# Patient Record
Sex: Female | Born: 1955 | Race: Black or African American | Hispanic: No | Smoking: Former smoker
Health system: Southern US, Community
[De-identification: ages and names within clinical notes are randomized; demographics above are authoritative.]

## PROBLEM LIST (undated history)

## (undated) ENCOUNTER — Ambulatory Visit (HOSPITAL_COMMUNITY): Discharge status: Absent without leave | Payer: Medicare HMO

## (undated) DIAGNOSIS — K219 Gastro-esophageal reflux disease without esophagitis: Secondary | ICD-10-CM

## (undated) DIAGNOSIS — G4733 Obstructive sleep apnea (adult) (pediatric): Secondary | ICD-10-CM

## (undated) DIAGNOSIS — M1990 Unspecified osteoarthritis, unspecified site: Secondary | ICD-10-CM

## (undated) DIAGNOSIS — J189 Pneumonia, unspecified organism: Secondary | ICD-10-CM

## (undated) DIAGNOSIS — U071 COVID-19: Secondary | ICD-10-CM

## (undated) DIAGNOSIS — I10 Essential (primary) hypertension: Secondary | ICD-10-CM

## (undated) DIAGNOSIS — R51 Headache: Secondary | ICD-10-CM

## (undated) HISTORY — DX: COVID-19: U07.1

## (undated) HISTORY — DX: Essential (primary) hypertension: I10

## (undated) HISTORY — PX: CERVICAL FUSION: SHX112

## (undated) HISTORY — DX: Pneumonia, unspecified organism: J18.9

## (undated) HISTORY — DX: Gastro-esophageal reflux disease without esophagitis: K21.9

## (undated) HISTORY — DX: Obstructive sleep apnea (adult) (pediatric): G47.33

## (undated) HISTORY — PX: OTHER SURGICAL HISTORY: SHX169

---

## 1999-04-17 ENCOUNTER — Inpatient Hospital Stay (HOSPITAL_COMMUNITY): Admission: EM | Admit: 1999-04-17 | Discharge: 1999-04-18 | Payer: Self-pay | Admitting: Emergency Medicine

## 2009-10-07 ENCOUNTER — Emergency Department (HOSPITAL_BASED_OUTPATIENT_CLINIC_OR_DEPARTMENT_OTHER): Admission: EM | Admit: 2009-10-07 | Discharge: 2009-10-07 | Payer: Self-pay | Admitting: Emergency Medicine

## 2011-06-13 ENCOUNTER — Emergency Department (HOSPITAL_COMMUNITY): Payer: No Typology Code available for payment source

## 2011-06-13 ENCOUNTER — Observation Stay (HOSPITAL_COMMUNITY)
Admission: EM | Admit: 2011-06-13 | Discharge: 2011-06-14 | Disposition: A | Discharge status: Home Health | Payer: No Typology Code available for payment source | Attending: Emergency Medicine | Admitting: Emergency Medicine

## 2011-06-13 ENCOUNTER — Encounter (HOSPITAL_COMMUNITY): Payer: Self-pay | Admitting: Emergency Medicine

## 2011-06-13 ENCOUNTER — Other Ambulatory Visit: Payer: Self-pay

## 2011-06-13 DIAGNOSIS — Z87891 Personal history of nicotine dependence: Secondary | ICD-10-CM | POA: Insufficient documentation

## 2011-06-13 DIAGNOSIS — IMO0001 Reserved for inherently not codable concepts without codable children: Secondary | ICD-10-CM | POA: Insufficient documentation

## 2011-06-13 DIAGNOSIS — R079 Chest pain, unspecified: Principal | ICD-10-CM | POA: Insufficient documentation

## 2011-06-13 DIAGNOSIS — I2510 Atherosclerotic heart disease of native coronary artery without angina pectoris: Secondary | ICD-10-CM | POA: Insufficient documentation

## 2011-06-13 DIAGNOSIS — I10 Essential (primary) hypertension: Secondary | ICD-10-CM | POA: Insufficient documentation

## 2011-06-13 LAB — CBC
MCH: 29.9 pg (ref 26.0–34.0)
MCHC: 34.3 g/dL (ref 30.0–36.0)
MCV: 87.3 fL (ref 78.0–100.0)
Platelets: 319 10*3/uL (ref 150–400)
RBC: 4.58 MIL/uL (ref 3.87–5.11)

## 2011-06-13 LAB — BASIC METABOLIC PANEL
CO2: 25 mEq/L (ref 19–32)
Calcium: 9.7 mg/dL (ref 8.4–10.5)
Creatinine, Ser: 0.76 mg/dL (ref 0.50–1.10)
GFR calc non Af Amer: >90 mL/min (ref >90)
Glucose, Bld: 105 mg/dL — ABNORMAL HIGH (ref 70–99)

## 2011-06-13 LAB — POCT I-STAT TROPONIN I: Troponin i, poc: 0 ng/mL (ref 0.00–0.08)

## 2011-06-13 MED ORDER — ONDANSETRON HCL 4 MG/2ML IJ SOLN: 4.0000 mg | Freq: Four times a day (QID) | INTRAMUSCULAR | Status: DC | PRN

## 2011-06-13 MED ORDER — MAGNESIUM HYDROXIDE 400 MG/5ML PO SUSP: 30.0000 mL | Freq: Two times a day (BID) | ORAL | Status: DC | PRN

## 2011-06-13 MED ORDER — METOPROLOL TARTRATE 25 MG PO TABS
50.0000 mg | ORAL_TABLET | Freq: Once | ORAL | Status: DC
  Filled 2011-06-13: qty 2

## 2011-06-13 MED ORDER — METOPROLOL TARTRATE 25 MG PO TABS
100.0000 mg | ORAL_TABLET | Freq: Once | ORAL | Status: DC
  Filled 2011-06-13: qty 4

## 2011-06-13 MED ORDER — ASPIRIN EC 325 MG PO TBEC: 325.0000 mg | DELAYED_RELEASE_TABLET | Freq: Every day | ORAL | Status: DC

## 2011-06-13 MED ORDER — ZOLPIDEM TARTRATE 5 MG PO TABS
5.0000 mg | ORAL_TABLET | Freq: Every evening | ORAL | Status: DC | PRN
  Administered 2011-06-14: 5 mg via ORAL
  Filled 2011-06-13: qty 1

## 2011-06-13 NOTE — ED Notes (Signed)
Patient complaining of chest pain that started this afternoon while she was on her way to church; patient states that the chest pain was worsened over the course of the evening.  Describes chest pain as mid-sternal, radiation to right arm. Reports blurred vision; denies other associated symptoms including shortness of breath, nausea, and vomiting.  Denies cardiac history.

## 2011-06-13 NOTE — ED Provider Notes (Addendum)
History     CSN: [id number]  Arrival date & time 06/13/11  2055   First MD Initiated Contact with Patient 06/13/11 2211      Chief Complaint  Patient presents with  . Chest Pain    (Consider location/radiation/quality/duration/timing/severity/associated sxs/prior treatment) Patient is a 56 y.o. female presenting with chest pain. The history is provided by the patient.  Chest Pain Pertinent negatives for primary symptoms include no shortness of breath, no abdominal pain, no nausea and no vomiting.  Pertinent negatives for associated symptoms include no numbness and no weakness.    patient has chest pain that started this afternoon loss was on her way to church. He was initially sharp but then became more dull. It is in the upper chest and goes to the right side a little bit. She said her vision did pleural. No shortness of breath, nauseousness, vomiting, or diaphoresis. She is a former smoker but quit 3 months ago. She has mild hypertension. Her pain is much improved now. No recent travel. She recently moved her from [place] and does not have a physician in town.  Past Medical History  Diagnosis Date  . No significant past medical history     Past Surgical History  Procedure Date  . Neck fusion     History reviewed. No pertinent family history.  History  Substance Use Topics  . Smoking status: Former Smoker    Quit date: 04/10/2011  . Smokeless tobacco: Not on file  . Alcohol Use: Yes     Occassional Use    OB History    Grav Para Term Preterm Abortions TAB SAB Ect Mult Living                  Review of Systems  Constitutional: Negative for activity change and appetite change.  HENT: Negative for neck stiffness.   Eyes: Negative for pain.  Respiratory: Negative for chest tightness and shortness of breath.   Cardiovascular: Positive for chest pain. Negative for leg swelling.  Gastrointestinal: Negative for nausea, vomiting, abdominal pain and diarrhea.    Genitourinary: Negative for flank pain.  Musculoskeletal: Negative for back pain.  Skin: Negative for rash.  Neurological: Negative for weakness, numbness and headaches.  Psychiatric/Behavioral: Negative for behavioral problems.    Allergies  Review of patient's allergies indicates no known allergies.  Home Medications   Current Outpatient Rx  Name Route Sig Dispense Refill  . AMLODIPINE BESYLATE 5 MG PO TABS Oral Take 5 mg by mouth daily.    . TRIAMTERENE-HCTZ 37.5-25 MG PO TABS Oral Take 1 tablet by mouth daily.      BP 158/82  Pulse 72  Temp(Src) 97.7 F (36.5 C) (Oral)  Resp 14  Ht 5\' 9"  (1.753 m)  Wt 199 lb (90.266 kg)  BMI 29.39 kg/m2  SpO2 100%  Physical Exam  Nursing note and vitals reviewed. Constitutional: She is oriented to person, place, and time. She appears well-developed and well-nourished.  HENT:  Head: Normocephalic and atraumatic.  Eyes: EOM are normal. Pupils are equal, round, and reactive to light.  Neck: Normal range of motion. Neck supple.  Cardiovascular: Normal rate, regular rhythm and normal heart sounds.   No murmur heard. Pulmonary/Chest: Effort normal and breath sounds normal. No respiratory distress. She has no wheezes. She has no rales.       No tenderness anterior chest. Pain is not worse with movement  Abdominal: Soft. Bowel sounds are normal. She exhibits no distension. There is no tenderness. There  is no rebound and no guarding.  Musculoskeletal: Normal range of motion.  Neurological: She is alert and oriented to person, place, and time. No cranial nerve deficit.  Skin: Skin is warm and dry.  Psychiatric: She has a normal mood and affect. Her speech is normal.    ED Course  Procedures (including critical care time)  Labs Reviewed  BASIC METABOLIC PANEL - Abnormal; Notable for the following:    Glucose, Bld 105 (*)    All other components within normal limits  CBC  POCT I-STAT TROPONIN I   Dg Chest 2 View  06/13/2011   *RADIOLOGY REPORT*  Clinical Data: Chest pain.  CHEST - 2 VIEW  Comparison: None.  Findings: Heart size and pulmonary vascularity are normal and the lungs are clear.  No effusions.  No acute osseous abnormality.  IMPRESSION: Normal chest.  Original Report Authenticated By: Gwynn Burly, M.D.     No diagnosis found.   Date: 06/14/2011  Rate: 75  Rhythm: normal sinus rhythm  QRS Axis: normal  Intervals: normal  ST/T Wave abnormalities: normal  Conduction Disutrbances:none  Narrative Interpretation: [facility]  Old EKG Reviewed: none available   Date: 06/14/2011  Rate: 64  Rhythm: normal sinus rhythm  QRS Axis: normal  Intervals: normal  ST/T Wave abnormalities: normal  Conduction Disutrbances:none  Narrative Interpretation:   Old EKG Reviewed: unchanged      MDM  Chest pain. Reassuring EKG and lower story. She was put in the CDU I was protocol for a CTA of the heart tomorrow.        Juliet Rude. Rubin Payor, MD 06/14/11 0018  Juliet Rude. Rubin Payor, MD 06/14/11 [phone]

## 2011-06-14 ENCOUNTER — Observation Stay (HOSPITAL_COMMUNITY): Payer: No Typology Code available for payment source

## 2011-06-14 ENCOUNTER — Other Ambulatory Visit: Payer: Self-pay

## 2011-06-14 MED ORDER — METOPROLOL TARTRATE 25 MG PO TABS: 50.0000 mg | ORAL_TABLET | Freq: Once | ORAL | Status: DC

## 2011-06-14 MED ORDER — IOHEXOL 350 MG/ML SOLN
80.0000 mL | Freq: Once | INTRAVENOUS | Status: AC | PRN
  Administered 2011-06-14: 80 mL via INTRAVENOUS

## 2011-06-14 MED ORDER — METOPROLOL TARTRATE 1 MG/ML IV SOLN
INTRAVENOUS | Status: AC
  Filled 2011-06-14: qty 5

## 2011-06-14 MED ORDER — NITROGLYCERIN 0.4 MG SL SUBL
0.4000 mg | SUBLINGUAL_TABLET | Freq: Once | SUBLINGUAL | Status: AC
  Administered 2011-06-14: 0.4 mg via SUBLINGUAL

## 2011-06-14 MED ORDER — METOPROLOL TARTRATE 1 MG/ML IV SOLN
5.0000 mg | Freq: Once | INTRAVENOUS | Status: AC
  Administered 2011-06-14: 5 mg via INTRAVENOUS

## 2011-06-14 MED ORDER — NITROGLYCERIN 0.4 MG SL SUBL
SUBLINGUAL_TABLET | SUBLINGUAL | Status: AC
  Filled 2011-06-14: qty 25

## 2011-06-14 MED ORDER — METOPROLOL TARTRATE 1 MG/ML IV SOLN
INTRAVENOUS | Status: AC
  Filled 2011-06-14: qty 10

## 2011-06-14 MED ORDER — METOPROLOL TARTRATE 25 MG PO TABS
100.0000 mg | ORAL_TABLET | Freq: Once | ORAL | Status: AC
  Administered 2011-06-14: 100 mg via ORAL
  Filled 2011-06-14: qty 4

## 2011-06-14 NOTE — ED Provider Notes (Addendum)
Patient pending CT coronary this morning. She is resting comfortably with negative serial troponins. VSS. Patient will be given PCP referrals if negative CT coronary report.   Lenon Oms [place], [place] 06/14/11 0840  Dr. 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 Patient is currently on HTN medication that she states she has not had since coming to ER but with HTN usually well controlled. She agrees to continuing her 3 mo hx of tobacco cessation and following up with PCP referral for cholesterol check and ongoing management of HTN. [facility] referral given for further evaluation of newly dx mild non obstructing CAD. Patient voices understanding of aforementioned treatment plan and is agreeable.   Jenness [place], [place] 06/14/11 1006

## 2011-06-14 NOTE — ED Provider Notes (Signed)
I was available for the completion of this patient's ED CDU evaluation.  Please see the initial ED note for Attending MD evaluation.  Gerhard Munch, MD 06/14/11 1501

## 2011-06-14 NOTE — ED Notes (Signed)
Moved to CDU 3 after report called to [facility].

## 2011-06-14 NOTE — Discharge Instructions (Signed)
Call [facility] cardiology today or tomorrow to establish close followup for recheck of your mild coronary artery disease but also call [place] health care for close followup for ongoing management of your high blood pressure and for testing of cholesterol. Return to emergency department for emergent changing or worsening symptoms  Coronary Artery Disease, Risk Factors Research has shown that the risk of developing coronary artery disease (CAD) and having a heart attack increases with each factor you have. RISK FACTORS YOU CANNOT CHANGE  Your age. Your risk goes up as you get older. Most heart attacks happen to people over the age of 53.   Gender. Men have a greater risk of heart attack than women, and they have attacks earlier in life. However, women are more likely to die from a heart attack.   Heredity. Children of parents with heart disease are more likely to develop it themselves.   Race. African Americans and other ethnic groups have a higher risk, possibly because of high blood pressure, a tendency toward obesity, and diabetes.   Your family. Most people with a strong family history of heart disease have one or more other risk factors.  RISK FACTORS YOU CAN CHANGE  Exposure to tobacco smoke. Even secondhand smoke greatly increases the risk for heart disease.   High blood cholesterol may be lowered with changes in diet, activity, and medicines.   High blood pressure makes the heart work harder. This causes the heart muscles to become thick and, eventually, weaker. It also increases your risk of stroke, heart attack, and kidney or heart failure.   Physical inactivity is a risk factor for CAD. Regular physical activity helps prevent heart and blood vessel disease. Exercise helps control blood cholesterol, diabetes, obesity, and it may help lower blood pressure in some people.   Excess body fat, especially belly fat, increases the risk of heart disease and stroke even if there are no other  risk factors. Excess weight increases the heart's workload and raises blood pressure and blood cholesterol.   Diabetes seriously increases your risk of developing CAD. If you have diabetes, you should work with your caregiver to manage it and control other risk factors.  OTHER RISK FACTORS FOR CAD  How you respond to stress.   Drinking too much alcohol may raise blood pressure, cause heart failure, and lead to stroke.   Total cholesterol greater than 200 milligrams.   HDL (good) cholesterol less than 40 milligrams. HDL helps keep cholesterol from building up in the walls of the arteries.  PREVENTING CAD  Maintain a healthy weight.   Exercise or do physical activity.   Eat a heart-healthy diet low in fat and salt and high in fiber.   Control your blood pressure to keep it below 120 over 80.   Keep your cholesterol at a level that lowers your risk.   Manage diabetes if you have it.   Stop smoking.   Learn how to manage stress.  HEART SMART SUBSTITUTIONS  Instead of whole or 2% milk and cream, use skim milk.   Instead of fried foods, eat baked, steamed, boiled, broiled, or microwaved foods.   Instead of lard, butter, palm and coconut oils, cook with unsaturated vegetable oils, such as corn, olive, canola, safflower, sesame, soybean, sunflower, or peanut.   Instead of fatty cuts of meat, eat lean cuts of meat or cut off the fatty parts.   Instead of 1 whole egg in recipes, use 2 egg whites.   Instead of sauces, butter,  and salt, season vegetables with herbs and spices.   Instead of regular hard and processed cheeses, eat low-fat, low-sodium cheeses.   Instead of salted potato chips, choose low-fat, unsalted tortilla and potato chips and unsalted pretzels and popcorn.   Instead of sour cream and mayonnaise, use plain low-fat yogurt, low-fat cottage cheese, or low-fat or "light" sour cream.  FOR MORE INFORMATION  National Heart Lung and Blood Institute:  https://nielsen.com/ American Heart Association: PopSte[employer].is Document Released: 06/16/2003 Document Revised: 03/15/2011 Document Reviewed: 06/11/2007 [facility] Patient Information 2012 [place], [place].  Chest Pain (Nonspecific) It is often hard to give a specific diagnosis for the cause of chest pain. There is always a chance that your pain could be related to something serious, such as a heart attack or a blood clot in the lungs. You need to follow up with your caregiver for further evaluation. CAUSES   Heartburn.   Pneumonia or bronchitis.   Anxiety or stress.   Inflammation around your heart (pericarditis) or lung (pleuritis or pleurisy).   A blood clot in the lung.   A collapsed lung (pneumothorax). It can develop suddenly on its own (spontaneous pneumothorax) or from injury (trauma) to the chest.   Shingles infection (herpes zoster virus).  The chest wall is composed of bones, muscles, and cartilage. Any of these can be the source of the pain.  The bones can be bruised by injury.   The muscles or cartilage can be strained by coughing or overwork.   The cartilage can be affected by inflammation and become sore (costochondritis).  DIAGNOSIS  Lab tests or other studies, such as X-rays, electrocardiography, stress testing, or cardiac imaging, may be needed to find the cause of your pain.  TREATMENT   Treatment depends on what may be causing your chest pain. Treatment may include:   Acid blockers for heartburn.   Anti-inflammatory medicine.   Pain medicine for inflammatory conditions.   Antibiotics if an infection is present.   You may be advised to change lifestyle habits. This includes stopping smoking and avoiding alcohol, caffeine, and chocolate.   You may be advised to keep your head raised (elevated) when sleeping. This reduces the chance of acid going backward from your stomach into your esophagus.   Most of the time, nonspecific chest  pain will improve within 2 to 3 days with rest and mild pain medicine.  HOME CARE INSTRUCTIONS   If antibiotics were prescribed, take your antibiotics as directed. Finish them even if you start to feel better.   For the next few days, avoid physical activities that bring on chest pain. Continue physical activities as directed.   Do not smoke.   Avoid drinking alcohol.   Only take over-the-counter or prescription medicine for pain, discomfort, or fever as directed by your caregiver.   Follow your caregiver's suggestions for further testing if your chest pain does not go away.   Keep any follow-up appointments you made. If you do not go to an appointment, you could develop lasting (chronic) problems with pain. If there is any problem keeping an appointment, you must call to reschedule.  SEEK MEDICAL CARE IF:   You think you are having problems from the medicine you are taking. Read your medicine instructions carefully.   Your chest pain does not go away, even after treatment.   You develop a rash with blisters on your chest.  SEEK IMMEDIATE MEDICAL CARE IF:   You have increased chest pain or pain that spreads to your arm,  neck, jaw, back, or abdomen.   You develop shortness of breath, an increasing cough, or you are coughing up blood.   You have severe back or abdominal pain, feel nauseous, or vomit.   You develop severe weakness, fainting, or chills.   You have a fever.  THIS IS AN EMERGENCY. Do not wait to see if the pain will go away. Get medical help at once. Call your local emergency services (911 in [place] Do not drive yourself to the hospital. MAKE SURE YOU:   Understand these instructions.   Will watch your condition.   Will get help right away if you are not doing well or get worse.  Document Released: 01/03/2005 Document Revised: 03/15/2011 Document Reviewed: 10/30/2007 [facility] Patient Information 2012 [place], [place].

## 2011-06-14 NOTE — Progress Notes (Signed)
Observation review is complete. 

## 2011-06-14 NOTE — ED Provider Notes (Signed)
I was available for the completion of this patient's ED CDU evaluation.  Please see the initial ED note for Attending MD evaluation.  Gerhard Munch, MD 06/14/11 [phone]

## 2011-06-18 ENCOUNTER — Encounter: Payer: PRIVATE HEALTH INSURANCE | Admitting: Nurse Practitioner

## 2011-06-18 ENCOUNTER — Ambulatory Visit (INDEPENDENT_AMBULATORY_CARE_PROVIDER_SITE_OTHER): Payer: PRIVATE HEALTH INSURANCE | Admitting: Cardiology

## 2011-06-18 ENCOUNTER — Encounter: Payer: Self-pay | Admitting: Cardiology

## 2011-06-18 DIAGNOSIS — R079 Chest pain, unspecified: Secondary | ICD-10-CM | POA: Insufficient documentation

## 2011-06-18 DIAGNOSIS — K219 Gastro-esophageal reflux disease without esophagitis: Secondary | ICD-10-CM | POA: Insufficient documentation

## 2011-06-18 DIAGNOSIS — I10 Essential (primary) hypertension: Secondary | ICD-10-CM | POA: Insufficient documentation

## 2011-06-18 NOTE — Progress Notes (Signed)
  HPI: 56 year old female for evaluation of chest pain. Seen in the emergency room on March 6 with chest pain. Cardiac enzymes negative. Hemoglobin, renal function and chest x-ray unremarkable. Cardiac CT showed a 25-50% first diagonal (closer to 25). No other disease noted. Coronary calcium score 0. Patient asked to arrange appointment. Patient states that on the night of evaluation she developed substernal chest pressure. It was different in her previous reflux pain. There was some radiation to her right upper extremity. There was some increase with inspiration. No change with position. No water brash. No associated symptoms. Resolved after 20 minutes. She had a second episode that lasted 5 minutes while in the ER. She has not had exertional chest pain but notes dyspnea with more extreme activities. No orthopnea, PND, pedal edema. No recent travel or leg injury.  Current Outpatient Prescriptions  Medication Sig Dispense Refill  . amLODipine (NORVASC) 5 MG tablet Take 5 mg by mouth daily.      [name] OMEPRAZOLE PO Take by mouth. Over the counter  As needed      . triamterene-hydrochlorothiazide (MAXZIDE-25) 37.5-25 MG per tablet Take 1 tablet by mouth daily.        No Known Allergies  Past Medical History  Diagnosis Date  . No significant past medical history     Past Surgical History  Procedure Date  . Neck fusion     History   Social History  . Marital Status: Divorced    Spouse Name: N/A    Number of Children: N/A  . Years of Education: N/A   Occupational History  . Not on file.   Social History Main Topics  . Smoking status: Former Smoker    Quit date: 04/10/2011  . Smokeless tobacco: Not on file  . Alcohol Use: Yes     Occassional Use  . Drug Use: No  . Sexually Active:    Other Topics Concern  . Not on file   Social History Narrative  . No narrative on file    No family history on file.  ROS:  no fevers or chills, productive cough, hemoptysis, dysphasia,  odynophagia, melena, hematochezia, dysuria, hematuria, rash, seizure activity, orthopnea, PND, pedal edema, claudication. Remaining systems are negative.  Physical Exam:   Blood pressure 140/84, pulse 72, height 5\' 9"  (1.753 m), weight 197 lb (89.359 kg).  General:  Well developed/well nourished in NAD Skin warm/dry Patient not depressed No peripheral clubbing Back-normal HEENT-normal/normal eyelids Neck supple/normal carotid upstroke bilaterally; no bruits; no JVD; no thyromegaly chest - CTA/ normal expansion CV - RRR/normal S1 and S2; no murmurs, rubs or gallops;  PMI nondisplaced Abdomen -NT/ND, no HSM, no mass, + bowel sounds, no bruit; no RUQ tenderness 2+ femoral pulses, no bruits Ext-no edema, chords, 2+ DP Neuro-grossly nonfocal  ECG 06/13/2011-sinus rhythm at a rate of 75. RV conduction delay. No ST changes.

## 2011-06-18 NOTE — Assessment & Plan Note (Signed)
Blood pressure controlled. Continue present medications. 

## 2011-06-18 NOTE — Assessment & Plan Note (Signed)
Patient symptoms atypical. Her electrocardiogram shows no ST changes. Her enzymes were negative. Hemoglobin normal. Her cardiac CT showed a calcium score of 0. There was a 25% lesion in the first diagonal. I do not think further cardiac workup is indicated. Continue Prilosec for possible reflux. Will arrange consult and followup with primary care. Her symptoms may be GI. She does need continued lifestyle modification including diet and exercise.

## 2011-06-18 NOTE — Patient Instructions (Signed)
Referral to primary care=[address]

## 2011-06-19 ENCOUNTER — Ambulatory Visit (INDEPENDENT_AMBULATORY_CARE_PROVIDER_SITE_OTHER): Payer: PRIVATE HEALTH INSURANCE | Admitting: Family

## 2011-06-19 ENCOUNTER — Encounter: Payer: Self-pay | Admitting: Family

## 2011-06-19 VITALS — BP 140/90 | Ht 69.0 in | Wt 197.0 lb

## 2011-06-19 DIAGNOSIS — Z1231 Encounter for screening mammogram for malignant neoplasm of breast: Secondary | ICD-10-CM

## 2011-06-19 DIAGNOSIS — I10 Essential (primary) hypertension: Secondary | ICD-10-CM

## 2011-06-19 DIAGNOSIS — K219 Gastro-esophageal reflux disease without esophagitis: Secondary | ICD-10-CM

## 2011-06-19 MED ORDER — ESOMEPRAZOLE MAGNESIUM 40 MG PO CPDR: 40.0000 mg | DELAYED_RELEASE_CAPSULE | Freq: Every day | ORAL | Status: DC

## 2011-06-19 MED ORDER — CYCLOBENZAPRINE HCL 10 MG PO TABS: 10.0000 mg | ORAL_TABLET | Freq: Three times a day (TID) | ORAL | Status: AC | PRN

## 2011-06-19 NOTE — Progress Notes (Signed)
Subjective:    Patient ID: Brianna Foster, female    DOB: [birth year], 56 y.o.   MRN: [id number]  HPI 56 year old [place] American female, nonsmoker is in for ED followup. She has a history of hypertension and GERD. She was seen in the emergency department with chest pain of her left chest, that radiated into her left arm. She has seen cardiology on yesterday who has been a full cardiac evaluation and this cleared her for any cardiac abnormality. The cardiologist suggests that this is musculoskeletal pain. In comparison to when the pain first started, her pain is significantly improved. She rates the pain a 6/10 at its worst with movement. She has not taken any medication for the discomfort at this point.  Patient has requested to have a complete physical exam. She is overdue on her mammogram and Pap smear. As of note, patient's father deceased with stomach cancer. Brother with esophageal cancer. She has a history of GERD, currently taken omeprazole but continues to have reflux symptoms.   Review of Systems  Constitutional: Negative.   HENT: Negative.   Eyes: Negative.   Respiratory: Negative.   Cardiovascular: Negative.        Chest wall pain  Genitourinary: Negative.   Musculoskeletal: Positive for myalgias.       Chest wall pain  Skin: Negative.   Neurological: Negative.   Hematological: Negative.    Past Medical History  Diagnosis Date  . Hypertension   . GERD (gastroesophageal reflux disease)     History   Social History  . Marital Status: Divorced    Spouse Name: N/A    Number of Children: 1  . 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 Smoking status: Former Smoker    Quit date: 04/10/2011  . Smokeless tobacco: Not on file  . Alcohol Use: Yes     Occassional Use  . Drug Use: No  . Sexually Active:    Other Topics Concern  . Not on file   Social History Narrative  . No narrative on file     Past Surgical History  Procedure Date  . Neck fusion     Family History  Problem Relation Age of Onset  . Heart disease Brother     Pacemaker    No Known Allergies  Current Outpatient Prescriptions on File Prior to Visit  Medication Sig Dispense Refill  . amLODipine (NORVASC) 5 MG tablet Take 5 mg by mouth daily.      [name] OMEPRAZOLE PO Take by mouth. Over the counter  As needed      . triamterene-hydrochlorothiazide (MAXZIDE-25) 37.5-25 MG per tablet Take 1 tablet by mouth daily.        BP 140/90  Ht 5\' 9"  (1.753 m)  Wt 197 lb (89.359 kg)  BMI 29.09 kg/m2chart    Objective:   Physical Exam  Constitutional: She is oriented to person, place, and time. She appears well-developed and well-nourished.  HENT:  Right Ear: External ear normal.  Left Ear: External ear normal.  Nose: Nose normal.  Mouth/Throat: Oropharynx is clear and moist.  Neck: Normal range of motion. Neck supple.  Cardiovascular: Normal rate, regular rhythm and normal heart sounds.   Pulmonary/Chest: Effort normal and breath sounds normal.  Abdominal: Soft.  Musculoskeletal: Normal range of motion.       Reproducible chest wall pain to palpation of the left chest.  Neurological: She  is alert and oriented to person, place, and time.  Skin: Skin is warm and dry.  Psychiatric: She has a normal mood and affect.          Assessment & Plan:  Assessment: Hypertension, GERD, costochondritis  Plan: Flexeril 10 mg 3 times a day as needed. DC omeprazole and start Nexium 40 mg once daily. Patient will return for complete physical exam for Pap smear. Mammogram requisition completed. Encouraged healthy diet, exercise self breast exams we'll follow with the patient at physical and sooner when necessary.

## 2011-06-19 NOTE — Patient Instructions (Signed)
Diet for GERD or PUD Nutrition therapy can help ease the discomfort of gastroesophageal reflux disease (GERD) and peptic ulcer disease (PUD).  HOME CARE INSTRUCTIONS   Eat your meals slowly, in a relaxed setting.   Eat 5 to 6 small meals per day.   If a food causes distress, stop eating it for a period of time.  FOODS TO AVOID  Coffee, regular or decaffeinated.   Cola beverages, regular or low calorie.   Tea, regular or decaffeinated.   Pepper.   Cocoa.   High fat foods, including meats.   Butter, margarine, hydrogenated oil (trans fats).   Peppermint or spearmint (if you have GERD).   Fruits and vegetables if not tolerated.   Alcohol.   Nicotine (smoking or chewing). This is one of the most potent stimulants to acid production in the gastrointestinal tract.   Any food that seems to aggravate your condition.  If you have questions regarding your diet, ask your caregiver or a registered dietitian. TIPS  Lying flat may make symptoms worse. Keep the head of your bed raised 6 to 9 inches (15 to 23 cm) by using a foam wedge or blocks under the legs of the bed.   Do not lay down until 3 hours after eating a meal.   Daily physical activity may help reduce symptoms.  MAKE SURE YOU:   Understand these instructions.   Will watch your condition.   Will get help right away if you are not doing well or get worse.  Document Released: 03/26/2005 Document Revised: 03/15/2011 Document Reviewed: 02/09/2011 [employer] Patient Information 2012 [employer]. 

## 2011-06-28 ENCOUNTER — Ambulatory Visit (INDEPENDENT_AMBULATORY_CARE_PROVIDER_SITE_OTHER): Payer: PRIVATE HEALTH INSURANCE | Admitting: Family

## 2011-06-28 ENCOUNTER — Encounter: Payer: Self-pay | Admitting: Family

## 2011-06-28 ENCOUNTER — Other Ambulatory Visit (HOSPITAL_COMMUNITY)
Admission: RE | Admit: 2011-06-28 | Discharge: 2011-06-28 | Disposition: A | Discharge status: Home / Self Care | Payer: PRIVATE HEALTH INSURANCE | Source: Ambulatory Visit | Attending: Family | Admitting: Family

## 2011-06-28 VITALS — BP 150/90 | Temp 98.3°F | Ht 69.0 in | Wt 199.0 lb

## 2011-06-28 DIAGNOSIS — I10 Essential (primary) hypertension: Secondary | ICD-10-CM

## 2011-06-28 DIAGNOSIS — Z0000 Encounter for general adult medical examination without abnormal findings: Secondary | ICD-10-CM

## 2011-06-28 DIAGNOSIS — Z01419 Encounter for gynecological examination (general) (routine) without abnormal findings: Secondary | ICD-10-CM | POA: Insufficient documentation

## 2011-06-28 DIAGNOSIS — K219 Gastro-esophageal reflux disease without esophagitis: Secondary | ICD-10-CM

## 2011-06-28 DIAGNOSIS — Z124 Encounter for screening for malignant neoplasm of cervix: Secondary | ICD-10-CM

## 2011-06-28 LAB — BASIC METABOLIC PANEL
BUN: 14 mg/dL (ref 6–23)
Calcium: 9.1 mg/dL (ref 8.4–10.5)
Creatinine, Ser: 0.7 mg/dL (ref 0.4–1.2)
GFR: 119.87 mL/min (ref >60.00)
Potassium: 3.7 mEq/L (ref 3.5–5.1)

## 2011-06-28 LAB — CBC
Hemoglobin: 13.7 g/dL (ref 12.0–15.0)
RDW: 12.4 % (ref 11.5–14.6)
WBC: 6.7 10*3/uL (ref 4.5–10.5)

## 2011-06-28 LAB — LIPID PANEL
Cholesterol: 177 mg/dL (ref 0–200)
HDL: 67.8 mg/dL (ref >39.00)
Triglycerides: 89 mg/dL (ref 0.0–149.0)
VLDL: 17.8 mg/dL (ref 0.0–40.0)

## 2011-06-28 MED ORDER — OMEPRAZOLE 40 MG PO CPDR: 40.0000 mg | DELAYED_RELEASE_CAPSULE | Freq: Every day | ORAL | Status: DC

## 2011-06-28 NOTE — Progress Notes (Signed)
Subjective:    Patient ID: Brianna Foster, female    DOB: 08/18/1955, 56 y.o.   MRN: [id number]  HPI Comments: This 56 year old African American female cover nonsmoker is in for complete physical exam. She has a history of GERD and she is currently taking Nexium 40 mg a day. He is tolerating the medication well, and it works well. However, her insurance will not cover the medication. Her last office visit we'll do followup of chest pain from the emergency department, since she's been taking Flexeril she has not had any more chest discomfort. Her last colonoscopy was in 2004 diagnostic screening. She has not had a screening colonoscopy. Her immunizations are up to date.     Review of Systems  Constitutional: Negative.   Eyes: Negative.   Respiratory: Negative.   Cardiovascular: Negative.   Gastrointestinal: Negative.   Genitourinary: Negative.   Musculoskeletal: Negative.   Skin: Negative.   Neurological: Negative.   Psychiatric/Behavioral: Negative.    Past Medical History  Diagnosis Date  . Hypertension   . GERD (gastroesophageal reflux disease)     History   Social History  . Marital Status: Divorced    Spouse Name: N/A    Number of Children: 1  . 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 Smoking status: Former Smoker    Quit date: 04/10/2011  . Smokeless tobacco: Not on file  . Alcohol Use: Yes     Occassional Use  . Drug Use: No  . Sexually Active:    Other Topics Concern  . Not on file   Social History Narrative  . No narrative on file    Past Surgical History  Procedure Date  . Neck fusion     Family History  Problem Relation Age of Onset  . Heart disease Brother     Pacemaker    No Known Allergies  Current Outpatient Prescriptions on File Prior to Visit  Medication Sig Dispense Refill  . amLODipine (NORVASC) 5 MG tablet Take 5 mg by mouth daily.      . cyclobenzaprine  (FLEXERIL) 10 MG tablet Take 1 tablet (10 mg total) by mouth 3 (three) times daily as needed for muscle spasms.  30 tablet  0  . esomeprazole (NEXIUM) 40 MG capsule Take 1 capsule (40 mg total) by mouth daily.  30 capsule  3  . triamterene-hydrochlorothiazide (MAXZIDE-25) 37.5-25 MG per tablet Take 1 tablet by mouth daily.        BP 150/90  Temp(Src) 98.3 F (36.8 C) (Oral)  Ht 5\' 9"  (1.753 m)  Wt 199 lb (90.266 kg)  BMI 29.39 kg/m2     Objective:   Physical Exam  Constitutional: She is oriented to person, place, and time. She appears well-developed and well-nourished.  HENT:  Head: Normocephalic and atraumatic.  Right Ear: External ear normal.  Left Ear: External ear normal.  Nose: Nose normal.  Mouth/Throat: Oropharynx is clear and moist.  Eyes: Conjunctivae and EOM are normal. Pupils are equal, round, and reactive to light.  Neck: Normal range of motion. Neck supple.  Cardiovascular: Normal rate, regular rhythm and normal heart sounds.   Pulmonary/Chest: Effort normal and breath sounds normal.  Abdominal: Soft. Bowel sounds are normal.  Genitourinary: Vagina normal and uterus normal.  Musculoskeletal: Normal range of motion.  Neurological: She is alert and oriented to person, place, and time. She has normal reflexes.  Skin:  Skin is warm and dry.  Psychiatric: She has a normal mood and affect.          Assessment & Plan:  Assessment: Complete physical exam with Pap smear, GERD, hypertension  Plan: Labs to include BMP, CBC, lipids, TSH Will notify patient of results. Encouraged healthy diet and exercise. Pap smear also sent. Will refer for screening colonoscopy. Patient to follow up on her chronic conditions in 6 months and sooner when necessary.

## 2011-06-28 NOTE — Patient Instructions (Signed)
Exercise to Stay Healthy  Exercise helps you become and stay healthy.    EXERCISE IDEAS AND TIPS  Choose exercises that:   You enjoy.   Fit into your day.  You do not need to exercise really hard to be healthy. You can do exercises at a slow or medium level and stay healthy. You can:   Stretch before and after working out.   Try yoga, Pilates, or tai chi.   Lift weights.   Walk fast, swim, jog, run, climb stairs, bicycle, dance, or rollerskate.   Take aerobic classes.    Exercises that burn about 150 calories:     Running 1  miles in 15 minutes.   Playing volleyball for 45 to 60 minutes.   Washing and waxing a car for 45 to 60 minutes.   Playing touch football for 45 minutes.   Walking 1  miles in 35 minutes.   Pushing a stroller 1  miles in 30 minutes.   Playing basketball for 30 minutes.   Raking leaves for 30 minutes.   Bicycling 5 miles in 30 minutes.   Walking 2 miles in 30 minutes.   Dancing for 30 minutes.   Shoveling snow for 15 minutes.   Swimming laps for 20 minutes.   Walking up stairs for 15 minutes.   Bicycling 4 miles in 15 minutes.   Gardening for 30 to 45 minutes.   Jumping rope for 15 minutes.   Washing windows or floors for 45 to 60 minutes.  Document Released: 04/28/2010 Document Revised: 03/15/2011 Document Reviewed: 04/28/2010  [employer] Patient Information 2012 [employer].

## 2011-07-02 ENCOUNTER — Ambulatory Visit: Payer: Self-pay

## 2011-07-03 ENCOUNTER — Telehealth: Payer: Self-pay | Admitting: Gastroenterology

## 2011-07-04 NOTE — Telephone Encounter (Signed)
Pt has been scheduled for New Pt appt on 07/23/11 new pt paperwork mailed

## 2011-07-09 ENCOUNTER — Ambulatory Visit: Payer: Self-pay

## 2011-07-11 ENCOUNTER — Ambulatory Visit: Payer: Self-pay

## 2011-07-18 ENCOUNTER — Ambulatory Visit: Payer: Self-pay

## 2011-07-23 ENCOUNTER — Ambulatory Visit: Payer: Self-pay | Admitting: Gastroenterology

## 2011-07-23 ENCOUNTER — Telehealth: Payer: Self-pay | Admitting: Gastroenterology

## 2011-07-23 NOTE — Telephone Encounter (Signed)
Do not charge  

## 2011-07-31 ENCOUNTER — Ambulatory Visit: Payer: PRIVATE HEALTH INSURANCE | Admitting: Family Medicine

## 2011-07-31 ENCOUNTER — Ambulatory Visit: Payer: PRIVATE HEALTH INSURANCE

## 2011-07-31 VITALS — BP 155/100 | HR 93 | Temp 98.5°F | Resp 18 | Ht 68.5 in | Wt 197.0 lb

## 2011-07-31 DIAGNOSIS — M25569 Pain in unspecified knee: Secondary | ICD-10-CM

## 2011-07-31 MED ORDER — HYDROCODONE-ACETAMINOPHEN 5-500 MG PO TABS: 1.0000 | ORAL_TABLET | ORAL | Status: AC | PRN

## 2011-07-31 MED ORDER — OXAPROZIN 600 MG PO TABS: 600.0000 mg | ORAL_TABLET | Freq: Two times a day (BID) | ORAL | Status: AC

## 2011-07-31 NOTE — Progress Notes (Signed)
Subjective: Patient was in [place] for the weekend. Walking carrying her grandchild and her right knee began hurting. No specific injury. No crepitance. Has a little swelling. No prior injury. Start regular exercise.  Objective He is minimally swollen tenderness laterally along the joint line walks with a significant limp. No warmth.  Assessment: Right knee pain  Plan  x-ray right knee [facility] reading (PRIMARY) by  Dr. Alwyn Ren Normal except very calcified patella  .

## 2011-07-31 NOTE — Patient Instructions (Signed)
Knee Pain The knee is the complex joint between your thigh and your lower leg. It is made up of bones, tendons, ligaments, and cartilage. The bones that make up the knee are:  The femur in the thigh.   The tibia and fibula in the lower leg.   The patella or kneecap riding in the groove on the lower femur.  CAUSES  Knee pain is a common complaint with many causes. A few of these causes are:  Injury, such as:   A ruptured ligament or tendon injury.   Torn cartilage.   Medical conditions, such as:   Gout   Arthritis   Infections   Overuse, over training or overdoing a physical activity.  Knee pain can be minor or severe. Knee pain can accompany debilitating injury. Minor knee problems often respond well to self-care measures or get well on their own. More serious injuries may need medical intervention or even surgery. SYMPTOMS The knee is complex. Symptoms of knee problems can vary widely. Some of the problems are:  Pain with movement and weight bearing.   Swelling and tenderness.   Buckling of the knee.   Inability to straighten or extend your knee.   Your knee locks and you cannot straighten it.   Warmth and redness with pain and fever.   Deformity or dislocation of the kneecap.  DIAGNOSIS  Determining what is wrong may be very straight forward such as when there is an injury. It can also be challenging because of the complexity of the knee. Tests to make a diagnosis may include:  Your caregiver taking a history and doing a physical exam.   Routine X-rays can be used to rule out other problems. X-rays will not reveal a cartilage tear. Some injuries of the knee can be diagnosed by:   Arthroscopy a surgical technique by which a small video camera is inserted through tiny incisions on the sides of the knee. This procedure is used to examine and repair internal knee joint problems. Tiny instruments can be used during arthroscopy to repair the torn knee cartilage  (meniscus).   Arthrography is a radiology technique. A contrast liquid is directly injected into the knee joint. Internal structures of the knee joint then become visible on X-ray film.   An MRI scan is a non x-ray radiology procedure in which magnetic fields and a computer produce two- or three-dimensional images of the inside of the knee. Cartilage tears are often visible using an MRI scanner. MRI scans have largely replaced arthrography in diagnosing cartilage tears of the knee.   Blood work.   Examination of the fluid that helps to lubricate the knee joint (synovial fluid). This is done by taking a sample out using a needle and a syringe.  TREATMENT The treatment of knee problems depends on the cause. Some of these treatments are:  Depending on the injury, proper casting, splinting, surgery or physical therapy care will be needed.   Give yourself adequate recovery time. Do not overuse your joints. If you begin to get sore during workout routines, back off. Slow down or do fewer repetitions.   For repetitive activities such as cycling or running, maintain your strength and nutrition.   Alternate muscle groups. For example if you are a weight lifter, work the upper body on one day and the lower body the next.   Either tight or weak muscles do not give the proper support for your knee. Tight or weak muscles do not absorb the stress placed   on the knee joint. Keep the muscles surrounding the knee strong.   Take care of mechanical problems.   If you have flat feet, orthotics or special shoes may help. See your caregiver if you need help.   Arch supports, sometimes with wedges on the inner or outer aspect of the heel, can help. These can shift pressure away from the side of the knee most bothered by osteoarthritis.   A brace called an "unloader" brace also may be used to help ease the pressure on the most arthritic side of the knee.   If your caregiver has prescribed crutches, braces,  wraps or ice, use as directed. The acronym for this is PRICE. This means protection, rest, ice, compression and elevation.   Nonsteroidal anti-inflammatory drugs (NSAID's), can help relieve pain. But if taken immediately after an injury, they may actually increase swelling. Take NSAID's with food in your stomach. Stop them if you develop stomach problems. Do not take these if you have a history of ulcers, stomach pain or bleeding from the bowel. Do not take without your caregiver's approval if you have problems with fluid retention, heart failure, or kidney problems.   For ongoing knee problems, physical therapy may be helpful.   Glucosamine and chondroitin are over-the-counter dietary supplements. Both may help relieve the pain of osteoarthritis in the knee. These medicines are different from the usual anti-inflammatory drugs. Glucosamine may decrease the rate of cartilage destruction.   Injections of a corticosteroid drug into your knee joint may help reduce the symptoms of an arthritis flare-up. They may provide pain relief that lasts a few months. You may have to wait a few months between injections. The injections do have a small increased risk of infection, water retention and elevated blood sugar levels.   Hyaluronic acid injected into damaged joints may ease pain and provide lubrication. These injections may work by reducing inflammation. A series of shots may give relief for as long as 6 months.   Topical painkillers. Applying certain ointments to your skin may help relieve the pain and stiffness of osteoarthritis. Ask your pharmacist for suggestions. Many over the-counter products are approved for temporary relief of arthritis pain.   In some countries, doctors often prescribe topical NSAID's for relief of chronic conditions such as arthritis and tendinitis. A review of treatment with NSAID creams found that they worked as well as oral medications but without the serious side effects.    PREVENTION  Maintain a healthy weight. Extra pounds put more strain on your joints.   Get strong, stay limber. Weak muscles are a common cause of knee injuries. Stretching is important. Include flexibility exercises in your workouts.   Be smart about exercise. If you have osteoarthritis, chronic knee pain or recurring injuries, you may need to change the way you exercise. This does not mean you have to stop being active. If your knees ache after jogging or playing basketball, consider switching to swimming, water aerobics or other low-impact activities, at least for a few days a week. Sometimes limiting high-impact activities will provide relief.   Make sure your shoes fit well. Choose footwear that is right for your sport.   Protect your knees. Use the proper gear for knee-sensitive activities. Use kneepads when playing volleyball or laying carpet. Buckle your seat belt every time you drive. Most shattered kneecaps occur in car accidents.   Rest when you are tired.  SEEK MEDICAL CARE IF:  You have knee pain that is continual and does not   seem to be getting better.  SEEK IMMEDIATE MEDICAL CARE IF:  Your knee joint feels hot to the touch and you have a high fever. MAKE SURE YOU:   Understand these instructions.   Will watch your condition.   Will get help right away if you are not doing well or get worse.  Document Released: 01/21/2007 Document Revised: 03/15/2011 Document Reviewed: 01/21/2007 [employer] Patient Information 2012 [employer]. 

## 2011-08-02 ENCOUNTER — Ambulatory Visit: Payer: Self-pay

## 2011-08-14 ENCOUNTER — Ambulatory Visit: Payer: Self-pay | Admitting: Gastroenterology

## 2011-08-14 ENCOUNTER — Telehealth: Payer: Self-pay | Admitting: Gastroenterology

## 2011-08-14 NOTE — Telephone Encounter (Signed)
Do not bill 

## 2011-08-20 ENCOUNTER — Ambulatory Visit
Admission: RE | Admit: 2011-08-20 | Discharge: 2011-08-20 | Disposition: A | Discharge status: Home / Self Care | Payer: PRIVATE HEALTH INSURANCE | Source: Ambulatory Visit | Attending: Family | Admitting: Family

## 2011-08-20 DIAGNOSIS — Z1231 Encounter for screening mammogram for malignant neoplasm of breast: Secondary | ICD-10-CM

## 2011-08-21 ENCOUNTER — Telehealth: Payer: Self-pay | Admitting: Family

## 2011-08-21 MED ORDER — ACYCLOVIR 400 MG PO TABS: 400.0000 mg | ORAL_TABLET | Freq: Every day | ORAL | Status: AC | PRN

## 2011-08-21 MED ORDER — AMLODIPINE BESYLATE 5 MG PO TABS: 5.0000 mg | ORAL_TABLET | Freq: Every day | ORAL | Status: DC

## 2011-08-21 NOTE — Telephone Encounter (Signed)
Rx for Norvasc sent to pharmacy. Advised pt that I have not received a request for any refills on her behalf. Pt also requested refill of her acyclovir for herpes. She takes it daily prn.  Pt spoke of a wrist injury x 2weeks ago that she was at an urgent care for. She complained that the UC provider didn't even touch her wrist. She states that it was looked at and that she was told she doesn't need an xray, at which time she was given indomethacin and tramadol. She c/o continued pain and swelling. Offered pt an x-ray referral and/or office visit. She declined stating that she will wait to see if it begins to heal.

## 2011-08-21 NOTE — Telephone Encounter (Signed)
Pt requesting to be contacted. Pt stated that she has been trying to get a refill on Norvacc  for over a month, pt stated that the pharmacy has faxed several refill requests with no response. Please contact

## 2011-08-22 ENCOUNTER — Telehealth: Payer: Self-pay | Admitting: Family

## 2011-08-22 ENCOUNTER — Ambulatory Visit (INDEPENDENT_AMBULATORY_CARE_PROVIDER_SITE_OTHER)
Admission: RE | Admit: 2011-08-22 | Discharge: 2011-08-22 | Disposition: A | Discharge status: Home / Self Care | Payer: PRIVATE HEALTH INSURANCE | Source: Ambulatory Visit | Attending: Family | Admitting: Family

## 2011-08-22 DIAGNOSIS — M25532 Pain in left wrist: Secondary | ICD-10-CM

## 2011-08-22 DIAGNOSIS — M25539 Pain in unspecified wrist: Secondary | ICD-10-CM

## 2011-08-22 NOTE — Telephone Encounter (Signed)
Pt is returning Brianna Foster call she would like a left wrist xray due to pain.

## 2011-08-22 NOTE — Telephone Encounter (Signed)
Pt aware x-ray ordered. States that she will get it done today. Advised pt that we may not be able to call her back until tomorrow. Verbalized understanding

## 2011-08-24 ENCOUNTER — Ambulatory Visit (INDEPENDENT_AMBULATORY_CARE_PROVIDER_SITE_OTHER): Payer: PRIVATE HEALTH INSURANCE | Admitting: Family

## 2011-08-24 ENCOUNTER — Encounter: Payer: Self-pay | Admitting: Family

## 2011-08-24 VITALS — BP 136/78 | Temp 98.7°F | Wt 203.0 lb

## 2011-08-24 DIAGNOSIS — M25539 Pain in unspecified wrist: Secondary | ICD-10-CM

## 2011-08-24 DIAGNOSIS — M778 Other enthesopathies, not elsewhere classified: Secondary | ICD-10-CM

## 2011-08-24 DIAGNOSIS — M25532 Pain in left wrist: Secondary | ICD-10-CM

## 2011-08-24 DIAGNOSIS — M7012 Bursitis, left hand: Secondary | ICD-10-CM

## 2011-08-24 MED ORDER — HYDROCODONE-ACETAMINOPHEN 5-500 MG PO TABS: 1.0000 | ORAL_TABLET | Freq: Three times a day (TID) | ORAL | Status: DC | PRN

## 2011-08-24 MED ORDER — PREDNISONE 20 MG PO TABS: ORAL_TABLET | ORAL | Status: AC

## 2011-08-24 NOTE — Progress Notes (Signed)
Subjective:    Patient ID: Brianna Foster, female    DOB: 1955/11/19, 56 y.o.   MRN: [id number]  HPI 56 year old African American female was in with complaints of left wrist pain x2 weeks. She's unsure of any particular injury, but reports lifting plants about 2 weeks ago. She was seen in urgent care clinic and was given tramadol and anti-inflammatory medication that did not help. 2 days later they gave her a two-day supply of Vicodin that helped but she continues to have swelling and pain. She called our office yesterday to request an x-ray. X-ray was negative.   Review of Systems  Constitutional: Negative.   Eyes: Negative.   Respiratory: Negative.   Cardiovascular: Negative.   Gastrointestinal: Negative.   Genitourinary: Negative.   Musculoskeletal: Positive for joint swelling.       Left wrist pain and swelling  Neurological: Negative.   Hematological: Negative.   Psychiatric/Behavioral: Negative.    Past Medical History  Diagnosis Date  . Hypertension   . GERD (gastroesophageal reflux disease)     History   Social History  . Marital Status: Divorced    Spouse Name: N/A    Number of Children: 1  . 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 Smoking status: Current Some Day Smoker    Types: Cigarettes    Last Attempt to Quit: 04/10/2011  . Smokeless tobacco: Never Used  . Alcohol Use: Yes     Occassional Use  . Drug Use: No  . Sexually Active: Not on file   Other Topics Concern  . Not on file   Social History Narrative  . No narrative on file    Past Surgical History  Procedure Date  . Neck fusion     Family History  Problem Relation Age of Onset  . Heart disease Brother     Pacemaker    No Known Allergies  Current Outpatient Prescriptions on File Prior to Visit  Medication Sig Dispense Refill  . acyclovir (ZOVIRAX) 400 MG tablet Take 1 tablet (400 mg total) by mouth daily as  needed.  30 tablet  3  . amLODipine (NORVASC) 5 MG tablet Take 1 tablet (5 mg total) by mouth daily.  90 tablet  1  . esomeprazole (NEXIUM) 40 MG capsule Take 1 capsule (40 mg total) by mouth daily.  30 capsule  3  . omeprazole (PRILOSEC) 40 MG capsule Take 1 capsule (40 mg total) by mouth daily.  30 capsule  3  . oxaprozin (DAYPRO) 600 MG tablet Take 1 tablet (600 mg total) by mouth 2 (two) times daily.  30 tablet  1  . triamterene-hydrochlorothiazide (MAXZIDE-25) 37.5-25 MG per tablet Take 1 tablet by mouth daily.        BP 136/78  Temp(Src) 98.7 F (37.1 C) (Oral)  Wt 203 lb (92.08 kg)chart    Objective:   Physical Exam  Constitutional: She is oriented to person, place, and time. She appears well-developed and well-nourished.  Neck: Normal range of motion. Neck supple.  Cardiovascular: Normal rate, regular rhythm and normal heart sounds.   Pulmonary/Chest: Effort normal and breath sounds normal.  Abdominal: Soft. Bowel sounds are normal.  Musculoskeletal:       Left wrist pain and swelling noted. Pain with flexion and dorsiflexion. No pain with extension. Tenderness to palpation of the distal radius. Mild swelling.  Neurological: She is alert and oriented to  person, place, and time.  Skin: Skin is warm and dry.  Psychiatric: She has a normal mood and affect.          Assessment & Plan:  Assessment: Wrist bursitis, left wrist pain  Plan: Anti-inflammatory twice a day. Vicodin one tablet every 8 hours when necessary pain. Ace wrap to left wrist. Ice. Patient call the office if symptoms worsen or persist. Recheck a schedule, when necessary.

## 2011-09-04 ENCOUNTER — Other Ambulatory Visit: Payer: Self-pay | Admitting: Family

## 2011-09-04 MED ORDER — DICLOFENAC SODIUM 75 MG PO TBEC: 75.0000 mg | DELAYED_RELEASE_TABLET | Freq: Two times a day (BID) | ORAL | Status: AC

## 2011-09-04 MED ORDER — HYDROCODONE-ACETAMINOPHEN 5-500 MG PO TABS: 1.0000 | ORAL_TABLET | Freq: Three times a day (TID) | ORAL | Status: AC | PRN

## 2011-09-14 ENCOUNTER — Telehealth: Payer: Self-pay | Admitting: Family

## 2011-09-14 DIAGNOSIS — M25539 Pain in unspecified wrist: Secondary | ICD-10-CM

## 2011-09-14 NOTE — Telephone Encounter (Signed)
Referral placed.

## 2011-09-14 NOTE — Telephone Encounter (Signed)
Pt feels she needs to be referred to an Ortho because her wrist is not getting any better. Pt requesting to be contacted

## 2011-09-18 ENCOUNTER — Encounter: Payer: Self-pay | Admitting: Family

## 2011-09-18 ENCOUNTER — Ambulatory Visit (INDEPENDENT_AMBULATORY_CARE_PROVIDER_SITE_OTHER): Payer: PRIVATE HEALTH INSURANCE | Admitting: Family

## 2011-09-18 VITALS — BP 130/70 | Temp 98.3°F | Wt 198.0 lb

## 2011-09-18 DIAGNOSIS — R059 Cough, unspecified: Secondary | ICD-10-CM

## 2011-09-18 DIAGNOSIS — R05 Cough: Secondary | ICD-10-CM

## 2011-09-18 DIAGNOSIS — J019 Acute sinusitis, unspecified: Secondary | ICD-10-CM

## 2011-09-18 MED ORDER — FLUTICASONE PROPIONATE 50 MCG/ACT NA SUSP: 2.0000 | Freq: Every day | NASAL | Status: DC

## 2011-09-18 MED ORDER — HYDROCOD POLST-CHLORPHEN POLST 10-8 MG/5ML PO LQCR: 5.0000 mL | Freq: Two times a day (BID) | ORAL | Status: DC | PRN

## 2011-09-18 NOTE — Patient Instructions (Signed)

## 2011-09-18 NOTE — Progress Notes (Signed)
  Subjective:    Patient ID: Brianna Foster, female    DOB: 07-03-1955, 56 y.o.   MRN: [id number]  HPI  56 year old [place] American female, smoker, presents with nasal congestion and cough x 3 days. Describes cough as dry and nonproductive. Also, has pressure and congestion in her ears. Denies fever, chills, or sore throat. 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    Review of Systems  Constitutional: Negative for fever, chills and fatigue.  HENT: Positive for congestion, rhinorrhea, postnasal drip and sinus pressure. Negative for ear pain and sore throat.   Eyes: Negative.   Respiratory: Positive for cough. Negative for chest tightness and shortness of breath.   Cardiovascular: Negative for chest pain.  Skin: Negative.   Neurological: Negative.   Hematological: Negative for adenopathy.       Objective:   Physical Exam  Constitutional: She is oriented to person, place, and time. She appears well-developed and well-nourished.  HENT:  Head: Normocephalic.  Right Ear: External ear normal.  Left Ear: External ear normal.  Mouth/Throat: Oropharynx is clear and moist.       Erythema to left nasal turbinates  Neck: Neck supple. No JVD present.  Cardiovascular: Normal rate, regular rhythm and normal heart sounds.  Exam reveals no gallop and no friction rub.   No murmur heard. Pulmonary/Chest: Effort normal and breath sounds normal. No stridor. No respiratory distress. She has no wheezes. She has no rales. She exhibits no tenderness.  Abdominal: Soft.  Lymphadenopathy:    She has no cervical adenopathy.  Neurological: She is alert and oriented to person, place, and time.  Skin: Skin is warm and dry. She is not diaphoretic.          Assessment & Plan:  Assessment: Sinusitis, Cough  Plan: Start fluticasone 2 sprays intranasally daily. Start Tussionex every 12 hours prn for cough. Instructed her to call office if she develops fever or symptoms worsen or  persist. Follow up as needed.

## 2011-09-24 ENCOUNTER — Ambulatory Visit (INDEPENDENT_AMBULATORY_CARE_PROVIDER_SITE_OTHER): Payer: PRIVATE HEALTH INSURANCE | Admitting: Family

## 2011-09-24 DIAGNOSIS — K219 Gastro-esophageal reflux disease without esophagitis: Secondary | ICD-10-CM

## 2011-09-24 MED ORDER — AZITHROMYCIN 250 MG PO TABS: ORAL_TABLET | ORAL | Status: AC

## 2011-09-24 MED ORDER — METHYLPREDNISOLONE 4 MG PO KIT: PACK | ORAL | Status: AC

## 2011-10-30 ENCOUNTER — Other Ambulatory Visit: Payer: Self-pay | Admitting: Family

## 2012-01-12 NOTE — Progress Notes (Signed)
°  Subjective:    Patient ID: Brianna Foster, female    DOB: 03-28-1956, 56 y.o.   MRN: [id number]  HPI    Review of Systems     Objective:   Physical Exam        Assessment & Plan:  appt cancelled

## 2012-01-17 ENCOUNTER — Ambulatory Visit (INDEPENDENT_AMBULATORY_CARE_PROVIDER_SITE_OTHER): Payer: PRIVATE HEALTH INSURANCE | Admitting: Internal Medicine

## 2012-01-17 ENCOUNTER — Encounter: Payer: Self-pay | Admitting: Internal Medicine

## 2012-01-17 VITALS — BP 140/100 | Temp 98.0°F | Wt 212.0 lb

## 2012-01-17 DIAGNOSIS — J069 Acute upper respiratory infection, unspecified: Secondary | ICD-10-CM

## 2012-01-17 MED ORDER — HYDROCOD POLST-CHLORPHEN POLST 10-8 MG/5ML PO LQCR: 5.0000 mL | Freq: Two times a day (BID) | ORAL | Status: DC | PRN

## 2012-01-17 NOTE — Progress Notes (Signed)
Subjective:    Patient ID: Brianna Foster, female    DOB: Oct 17, 1955, 56 y.o.   MRN: [id number]  HPI  56 year old patient who presents with a two-day history of nasal congestion and largely nonproductive cough. The latter is worse during the night. She does have a history of allergic rhinitis and continues to take fluticasone. Denies any fever. Cough is nonproductive. She has small-volume clear nasal discharge.  Past Medical History  Diagnosis Date  . Hypertension   . GERD (gastroesophageal reflux disease)     History   Social History  . Marital Status: Divorced    Spouse Name: N/A    Number of Children: 1  . 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 Smoking status: Current Some Day Smoker    Types: Cigarettes    Last Attempt to Quit: 04/10/2011  . Smokeless tobacco: Never Used  . Alcohol Use: Yes     Occassional Use  . Drug Use: No  . Sexually Active: Not on file   Other Topics Concern  . Not on file   Social History Narrative  . No narrative on file    Past Surgical History  Procedure Date  . Neck fusion     Family History  Problem Relation Age of Onset  . Heart disease Brother     Pacemaker    No Known Allergies  Current Outpatient Prescriptions on File Prior to Visit  Medication Sig Dispense Refill  . amLODipine (NORVASC) 5 MG tablet Take 1 tablet (5 mg total) by mouth daily.  90 tablet  1  . diclofenac (VOLTAREN) 75 MG EC tablet Take 1 tablet (75 mg total) by mouth 2 (two) times daily.  60 tablet  2  . esomeprazole (NEXIUM) 40 MG capsule Take 1 capsule (40 mg total) by mouth daily.  30 capsule  3  . fluticasone (FLONASE) 50 MCG/ACT nasal spray Place 2 sprays into the nose daily.  16 g  6  . HYDROcodone-acetaminophen (NORCO) 5-325 MG per tablet       . indomethacin (INDOCIN) 25 MG capsule       . omeprazole (PRILOSEC) 40 MG capsule Take 1 capsule (40 mg total) by mouth daily.  30  capsule  3  . oxaprozin (DAYPRO) 600 MG tablet Take 1 tablet (600 mg total) by mouth 2 (two) times daily.  30 tablet  1  . traMADol (ULTRAM) 50 MG tablet       . triamterene-hydrochlorothiazide (MAXZIDE-25) 37.5-25 MG per tablet TAKE 1 TAB(S) ONCE A DAY ORALLY  30 tablet  2  . acyclovir (ZOVIRAX) 400 MG tablet         BP 140/100  Temp 98 F (36.7 C) (Oral)  Wt 212 lb (96.163 kg)       Review of Systems  Constitutional: Positive for fatigue. Negative for unexpected weight change.  HENT: Positive for congestion and rhinorrhea. Negative for hearing loss, sore throat, dental problem, sinus pressure and tinnitus.   Eyes: Negative for pain, discharge and visual disturbance.  Respiratory: Positive for cough. Negative for shortness of breath.   Cardiovascular: Negative for chest pain, palpitations and leg swelling.  Gastrointestinal: Negative for nausea, vomiting, abdominal pain, diarrhea, constipation, blood in stool and abdominal distention.  Genitourinary: Negative for dysuria, urgency, frequency, hematuria, flank pain, vaginal bleeding, vaginal discharge, difficulty urinating, vaginal pain and pelvic pain.  Musculoskeletal: Negative for joint swelling, arthralgias and  gait problem.  Skin: Negative for rash.  Neurological: Negative for dizziness, syncope, speech difficulty, weakness, numbness and headaches.  Hematological: Negative for adenopathy.  Psychiatric/Behavioral: Negative for behavioral problems, dysphoric mood and agitation. The patient is not nervous/anxious.        Objective:   Physical Exam  Constitutional: She is oriented to person, place, and time. She appears well-developed and well-nourished.  HENT:  Head: Normocephalic.  Right Ear: External ear normal.  Left Ear: External ear normal.  Mouth/Throat: Oropharynx is clear and moist.       No focal sinus tenderness  Eyes: Conjunctivae normal and EOM are normal. Pupils are equal, round, and reactive to light.  Neck:  Normal range of motion. Neck supple. No thyromegaly present.  Cardiovascular: Normal rate, regular rhythm, normal heart sounds and intact distal pulses.   Pulmonary/Chest: Effort normal and breath sounds normal.  Abdominal: Soft. Bowel sounds are normal. She exhibits no mass. There is no tenderness.  Musculoskeletal: Normal range of motion.  Lymphadenopathy:    She has no cervical adenopathy.  Neurological: She is alert and oriented to person, place, and time.  Skin: Skin is warm and dry. No rash noted.  Psychiatric: She has a normal mood and affect. Her behavior is normal.          Assessment & Plan:   Viral URI with cough. We'll treat symptomatically

## 2012-01-17 NOTE — Patient Instructions (Addendum)
Acute sinusitis symptoms for less than 10 days are generally not helped by antibiotic therapy.  Use saline irrigation, warm  moist compresses and over-the-counter decongestants only as directed.  Call if there is no improvement in 5 to 7 days, or sooner if you develop increasing pain, fever, or any new symptoms.  And continue fluticasone nasal spray daily

## 2012-02-27 ENCOUNTER — Other Ambulatory Visit: Payer: Self-pay | Admitting: Family

## 2012-04-08 ENCOUNTER — Other Ambulatory Visit: Payer: Self-pay | Admitting: Family

## 2012-05-18 ENCOUNTER — Other Ambulatory Visit: Payer: Self-pay | Admitting: Family

## 2012-06-25 ENCOUNTER — Telehealth: Payer: Self-pay | Admitting: Family

## 2012-06-25 MED ORDER — AMLODIPINE BESYLATE 5 MG PO TABS: ORAL_TABLET | ORAL | Status: DC

## 2012-06-25 MED ORDER — TRIAMTERENE-HCTZ 37.5-25 MG PO TABS: ORAL_TABLET | ORAL | Status: DC

## 2012-06-25 NOTE — Telephone Encounter (Signed)
Patient called stating that she need a refill of her amlodipine 5mg  1poqd and riamterene-hydrochlorothiazide (MAXZIDE-25) 37.5-25 MG per tablet 1poqd sent to [facility] on [address]. Please assist as patient has been trying to get this for two days.

## 2012-06-25 NOTE — Telephone Encounter (Signed)
Rx sent to pharmacy 30 day supply only. Pt need OV

## 2012-07-01 ENCOUNTER — Ambulatory Visit: Payer: PRIVATE HEALTH INSURANCE | Admitting: Family

## 2012-07-17 ENCOUNTER — Encounter: Payer: PRIVATE HEALTH INSURANCE | Admitting: Family

## 2012-07-23 ENCOUNTER — Ambulatory Visit (INDEPENDENT_AMBULATORY_CARE_PROVIDER_SITE_OTHER): Payer: BC Managed Care – PPO | Admitting: Family

## 2012-07-23 ENCOUNTER — Encounter: Payer: Self-pay | Admitting: Family

## 2012-07-23 ENCOUNTER — Other Ambulatory Visit (HOSPITAL_COMMUNITY)
Admission: RE | Admit: 2012-07-23 | Discharge: 2012-07-23 | Disposition: A | Discharge status: Home / Self Care | Payer: BC Managed Care – PPO | Source: Ambulatory Visit | Attending: Family | Admitting: Family

## 2012-07-23 VITALS — BP 134/80 | HR 77 | Ht 69.0 in | Wt 220.0 lb

## 2012-07-23 DIAGNOSIS — I10 Essential (primary) hypertension: Secondary | ICD-10-CM

## 2012-07-23 DIAGNOSIS — IMO0002 Reserved for concepts with insufficient information to code with codable children: Secondary | ICD-10-CM

## 2012-07-23 DIAGNOSIS — Z0000 Encounter for general adult medical examination without abnormal findings: Secondary | ICD-10-CM

## 2012-07-23 DIAGNOSIS — Z124 Encounter for screening for malignant neoplasm of cervix: Secondary | ICD-10-CM

## 2012-07-23 DIAGNOSIS — F17200 Nicotine dependence, unspecified, uncomplicated: Secondary | ICD-10-CM

## 2012-07-23 DIAGNOSIS — M1710 Unilateral primary osteoarthritis, unspecified knee: Secondary | ICD-10-CM

## 2012-07-23 DIAGNOSIS — Z720 Tobacco use: Secondary | ICD-10-CM | POA: Insufficient documentation

## 2012-07-23 DIAGNOSIS — Z01419 Encounter for gynecological examination (general) (routine) without abnormal findings: Secondary | ICD-10-CM | POA: Insufficient documentation

## 2012-07-23 LAB — CBC WITH DIFFERENTIAL/PLATELET
Basophils Absolute: 0 10*3/uL (ref 0.0–0.1)
Eosinophils Relative: 1.3 % (ref 0.0–5.0)
MCV: 89.5 fl (ref 78.0–100.0)
Monocytes Absolute: 0.9 10*3/uL (ref 0.1–1.0)
Monocytes Relative: 17.6 % — ABNORMAL HIGH (ref 3.0–12.0)
Neutrophils Relative %: 53.6 % (ref 43.0–77.0)
Platelets: 305 10*3/uL (ref 150.0–400.0)
RDW: 13.3 % (ref 11.5–14.6)
WBC: 5.4 10*3/uL (ref 4.5–10.5)

## 2012-07-23 LAB — COMPREHENSIVE METABOLIC PANEL
Albumin: 4.3 g/dL (ref 3.5–5.2)
CO2: 28 mEq/L (ref 19–32)
GFR: 98.46 mL/min (ref >60.00)
Glucose, Bld: 87 mg/dL (ref 70–99)
Potassium: 4.5 mEq/L (ref 3.5–5.1)
Sodium: 140 mEq/L (ref 135–145)
Total Protein: 8 g/dL (ref 6.0–8.3)

## 2012-07-23 LAB — LIPID PANEL
Cholesterol: 223 mg/dL — ABNORMAL HIGH (ref 0–200)
Total CHOL/HDL Ratio: 4
Triglycerides: 100 mg/dL (ref 0.0–149.0)

## 2012-07-23 MED ORDER — AMLODIPINE BESYLATE 5 MG PO TABS: ORAL_TABLET | ORAL | Status: DC

## 2012-07-23 MED ORDER — VENLAFAXINE HCL ER 37.5 MG PO CP24: 37.5000 mg | ORAL_CAPSULE | Freq: Every day | ORAL | Status: DC

## 2012-07-23 MED ORDER — MECLIZINE HCL 32 MG PO TABS: 32.0000 mg | ORAL_TABLET | Freq: Three times a day (TID) | ORAL | Status: DC | PRN

## 2012-07-23 MED ORDER — OMEPRAZOLE 40 MG PO CPDR: 40.0000 mg | DELAYED_RELEASE_CAPSULE | Freq: Every day | ORAL | Status: DC

## 2012-07-23 MED ORDER — ACYCLOVIR 400 MG PO TABS: 400.0000 mg | ORAL_TABLET | Freq: Three times a day (TID) | ORAL | Status: DC

## 2012-07-23 MED ORDER — TRIAMCINOLONE ACETONIDE 0.1 % EX CREA: TOPICAL_CREAM | Freq: Two times a day (BID) | CUTANEOUS | Status: DC

## 2012-07-23 MED ORDER — TRIAMTERENE-HCTZ 37.5-25 MG PO TABS: ORAL_TABLET | ORAL | Status: DC

## 2012-07-23 NOTE — Progress Notes (Signed)
Subjective:    Patient ID: Brianna Foster, female    DOB: 02/28/1956, 57 y.o.   MRN: [id number]  HPI  This is a routine physical examination for this healthy, Smoking,   Female. Reviewed all health maintenance protocols including mammography colonoscopy bone density and reviewed appropriate screening labs. Her immunization history was reviewed as well as her current medications and allergies refills of her chronic medications were given and the plan for yearly health maintenance was discussed all orders and referrals were made as appropriate.  Patient has concerns today of menopausal symptoms that include difficulty sleeping, hot flashes, and increased irritability. She has taken black cohosh in the past and has not been effective. She's requesting assistance. She has a sister who's had breast cancer. Mother deceased with lung cancer.   Review of Systems  Constitutional: Negative.   HENT: Negative.   Eyes: Negative.   Respiratory: Negative.   Cardiovascular: Negative.   Gastrointestinal: Negative.   Endocrine: Negative.   Genitourinary: Negative.   Musculoskeletal: Negative.   Skin: Negative.   Allergic/Immunologic: Negative.   Neurological: Negative.   Hematological: Negative.   Psychiatric/Behavioral: Negative.    Past Medical History  Diagnosis Date  . Hypertension   . GERD (gastroesophageal reflux disease)     History   Social History  . Marital Status: Divorced    Spouse Name: N/A    Number of Children: 1  . 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 Smoking status: Current Some Day Smoker    Types: Cigarettes    Last Attempt to Quit: 04/10/2011  . Smokeless tobacco: Never Used  . Alcohol Use: Yes     Comment: Occassional Use  . Drug Use: No  . Sexually Active: Not on file   Other Topics Concern  . Not on file   Social History Narrative  . No narrative on file    Past Surgical History   Procedure Laterality Date  . Neck fusion      Family History  Problem Relation Age of Onset  . Heart disease Brother     Pacemaker    No Known Allergies  Current Outpatient Prescriptions on File Prior to Visit  Medication Sig Dispense Refill  . diclofenac (VOLTAREN) 75 MG EC tablet Take 1 tablet (75 mg total) by mouth 2 (two) times daily.  60 tablet  2  . fluticasone (FLONASE) 50 MCG/ACT nasal spray Place 2 sprays into the nose daily.  16 g  6  . indomethacin (INDOCIN) 25 MG capsule       . oxaprozin (DAYPRO) 600 MG tablet Take 1 tablet (600 mg total) by mouth 2 (two) times daily.  30 tablet  1  . traMADol (ULTRAM) 50 MG tablet       . chlorpheniramine-HYDROcodone (TUSSIONEX PENNKINETIC ER) 10-8 MG/5ML LQCR Take 5 mLs by mouth every 12 (twelve) hours as needed.  140 mL  0  . HYDROcodone-acetaminophen (NORCO) 5-325 MG per tablet       . omeprazole (PRILOSEC) 40 MG capsule Take 1 capsule (40 mg total) by mouth daily.  30 capsule  3   No current facility-administered medications on file prior to visit.    BP 134/80  Pulse 77  Ht 5\' 9"  (1.753 m)  Wt 220 lb (99.791 kg)  BMI 32.47 kg/m2  SpO2 99%chart    Objective:   Physical Exam  Constitutional: She is oriented to  person, place, and time. She appears well-developed and well-nourished.  HENT:  Right Ear: External ear normal.  Left Ear: External ear normal.  Nose: Nose normal.  Mouth/Throat: Oropharynx is clear and moist.  Eyes: Conjunctivae and EOM are normal. Pupils are equal, round, and reactive to light.  Neck: Normal range of motion. Neck supple.  Cardiovascular: Normal rate, regular rhythm and normal heart sounds.   Pulmonary/Chest: Effort normal and breath sounds normal.  Abdominal: Soft. Bowel sounds are normal.  Genitourinary: Vagina normal and uterus normal. Guaiac negative stool. No vaginal discharge found.  Musculoskeletal: Normal range of motion.  Neurological: She is alert and oriented to person, place, and  time. She has normal reflexes.  Skin: Skin is warm and dry.  Psychiatric: She has a normal mood and affect.          Assessment & Plan:  Assessment: 1. Complete physical exam 2. Menopausal symptoms 3. Tobacco abuse 4. Hypertension 5. Osteoarthritis  Plan: Lab sent to include BMP, lipids, CBC, TSH, LFTs will notify patient pending results. Pap smear sent. Start Effexor 37.5 mg to help control urinary ability of menopausal symptoms. Will recheck in 3 weeks. Strongly encouraged smoking cessation. Patient will schedule mammogram. I will refer to GI for colonoscopy screening. Continue current medications.

## 2012-07-23 NOTE — Patient Instructions (Addendum)

## 2012-07-24 ENCOUNTER — Other Ambulatory Visit: Payer: Self-pay

## 2012-07-24 MED ORDER — MECLIZINE HCL 12.5 MG PO TABS: 12.5000 mg | ORAL_TABLET | Freq: Three times a day (TID) | ORAL | Status: DC | PRN

## 2012-10-06 ENCOUNTER — Ambulatory Visit (INDEPENDENT_AMBULATORY_CARE_PROVIDER_SITE_OTHER): Payer: BC Managed Care – PPO | Admitting: Family Medicine

## 2012-10-06 VITALS — BP 140/82 | HR 72 | Temp 98.1°F | Resp 18 | Ht 69.0 in | Wt 212.0 lb

## 2012-10-06 DIAGNOSIS — H8113 Benign paroxysmal vertigo, bilateral: Secondary | ICD-10-CM

## 2012-10-06 DIAGNOSIS — H9201 Otalgia, right ear: Secondary | ICD-10-CM

## 2012-10-06 DIAGNOSIS — R42 Dizziness and giddiness: Secondary | ICD-10-CM

## 2012-10-06 DIAGNOSIS — M533 Sacrococcygeal disorders, not elsewhere classified: Secondary | ICD-10-CM

## 2012-10-06 DIAGNOSIS — H9209 Otalgia, unspecified ear: Secondary | ICD-10-CM

## 2012-10-06 DIAGNOSIS — H8110 Benign paroxysmal vertigo, unspecified ear: Secondary | ICD-10-CM

## 2012-10-06 DIAGNOSIS — I10 Essential (primary) hypertension: Secondary | ICD-10-CM

## 2012-10-06 LAB — POCT CBC
Granulocyte percent: 59 %G (ref 37–80)
MID (cbc): 0.8 (ref 0–0.9)
MPV: 7.6 fL (ref 0–99.8)
POC Granulocyte: 4.2 (ref 2–6.9)
POC LYMPH PERCENT: 29.6 %L (ref 10–50)
Platelet Count, POC: 333 10*3/uL (ref 142–424)
RDW, POC: 13.5 %

## 2012-10-06 LAB — GLUCOSE, POCT (MANUAL RESULT ENTRY): POC Glucose: 90 mg/dl (ref 70–99)

## 2012-10-06 MED ORDER — FLUTICASONE PROPIONATE 50 MCG/ACT NA SUSP: NASAL | Status: DC

## 2012-10-06 MED ORDER — DIAZEPAM 2 MG PO TABS: ORAL_TABLET | ORAL | Status: DC

## 2012-10-06 NOTE — Progress Notes (Signed)
Subjective: 57 year old lady who is here with history of having had vertigo since Thursday. She has a long history of having had some of these problems, and has Antivert. She was doing a little bit better by yesterday. Today however she has had a different sort of a dizziness. She has had a lightheadedness sensation like she might pass out. She's been unsteady. However she did go to work at work all day. Her employees told her that she did not look well. She has been having right ear pain. Yesterday she had a ringing in the ear. She has not had any headache. No chest pain or shortness of breath. No neuromuscular coordination problems.  Objective: Well developed well nourished lady in no obvious distress. Her TMs appear normal. Eyes PERRLA. EOMs intact. Fundi benign. Throat clear. Neck supple without nodes thyromegaly. No carotid bruits. Chest is clear to auscultation. Heart regular without murmurs. Cranial nerves 2-12 grossly intact. Deep tender reflexes symmetrical in arms and legs. Skin is warm and dry. Romberg is negative. Finger to nose normal. Heel toe walk is adequate but a little unsteady. Sensory grossly intact. She is fully alert and oriented.  Assessment: Lightheadedness Vertigo Right otalgia History of hypertension, controlled  Plan: Check EKG, glucose, and CBC.  EKG unchanged from 07/23/12  Results for orders placed in visit on 10/06/12  POCT CBC      Result Value Range   WBC 7.1  4.6 - 10.2 K/uL   Lymph, poc 2.1  0.6 - 3.4   POC LYMPH PERCENT 29.6  10 - 50 %L   MID (cbc) 0.8  0 - 0.9   POC MID % 11.4  0 - 12 %M   POC Granulocyte 4.2  2 - 6.9   Granulocyte percent 59.0  37 - 80 %G   RBC 4.83  4.04 - 5.48 M/uL   Hemoglobin 14.7  12.2 - 16.2 g/dL   HCT, POC 91.4  78.2 - 47.9 %   MCV 95.2  80 - 97 fL   MCH, POC 30.4  27 - 31.2 pg   MCHC 32.0  31.8 - 35.4 g/dL   RDW, POC 95.6     Platelet Count, POC 333  142 - 424 K/uL   MPV 7.6  0 - 99.8 fL  GLUCOSE, POCT (MANUAL RESULT  ENTRY)      Result Value Range   POC Glucose 90  70 - 99 mg/dl   Assessment: Dizziness and lightheadedness, etiology unclear History of vertigo Otalgia etiology unclear  Plan: Will try low-dose Valium to see how that does. If she is not improving with the dizziness or if the ear pain is progressing without any TM changes would need to consider imaging. We'll give some fluticasone spray to try and open the eustachian tubes also.

## 2012-10-06 NOTE — Patient Instructions (Addendum)
Take the Valium 2 mg every 6 or 8 hours if needed for dizziness.  Use fluticasone nose spray 2 sprays each nostril twice daily for 3 days, then once daily to try and open the eustachian tube.  If her ear pain gets worse return. If dizziness or lightheadedness persists return. In the event that you are acutely worse at anytime her to the emergency room. You can either come back here or see your primary care.

## 2012-10-07 ENCOUNTER — Encounter (HOSPITAL_COMMUNITY): Payer: Self-pay | Admitting: Emergency Medicine

## 2012-10-07 ENCOUNTER — Emergency Department (HOSPITAL_COMMUNITY)
Admission: EM | Admit: 2012-10-07 | Discharge: 2012-10-07 | Disposition: A | Discharge status: Home / Self Care | Payer: BC Managed Care – PPO | Attending: Emergency Medicine | Admitting: Emergency Medicine

## 2012-10-07 ENCOUNTER — Emergency Department (HOSPITAL_COMMUNITY): Payer: BC Managed Care – PPO

## 2012-10-07 ENCOUNTER — Telehealth: Payer: Self-pay

## 2012-10-07 DIAGNOSIS — R519 Headache, unspecified: Secondary | ICD-10-CM

## 2012-10-07 DIAGNOSIS — R51 Headache: Secondary | ICD-10-CM | POA: Insufficient documentation

## 2012-10-07 DIAGNOSIS — K219 Gastro-esophageal reflux disease without esophagitis: Secondary | ICD-10-CM | POA: Insufficient documentation

## 2012-10-07 DIAGNOSIS — Z981 Arthrodesis status: Secondary | ICD-10-CM | POA: Insufficient documentation

## 2012-10-07 DIAGNOSIS — I10 Essential (primary) hypertension: Secondary | ICD-10-CM | POA: Insufficient documentation

## 2012-10-07 DIAGNOSIS — Z79899 Other long term (current) drug therapy: Secondary | ICD-10-CM | POA: Insufficient documentation

## 2012-10-07 DIAGNOSIS — F17200 Nicotine dependence, unspecified, uncomplicated: Secondary | ICD-10-CM | POA: Insufficient documentation

## 2012-10-07 MED ORDER — KETOROLAC TROMETHAMINE 30 MG/ML IJ SOLN
30.0000 mg | Freq: Once | INTRAMUSCULAR | Status: AC
  Administered 2012-10-07: 30 mg via INTRAVENOUS
  Filled 2012-10-07: qty 1

## 2012-10-07 MED ORDER — METOCLOPRAMIDE HCL 5 MG/ML IJ SOLN
10.0000 mg | Freq: Once | INTRAMUSCULAR | Status: AC
  Administered 2012-10-07: 10 mg via INTRAVENOUS
  Filled 2012-10-07: qty 2

## 2012-10-07 MED ORDER — DIPHENHYDRAMINE HCL 50 MG/ML IJ SOLN
50.0000 mg | Freq: Once | INTRAMUSCULAR | Status: AC
  Administered 2012-10-07: 50 mg via INTRAVENOUS
  Filled 2012-10-07: qty 1

## 2012-10-07 NOTE — ED Notes (Signed)
Pt was senn yesterday at urgent care for headache and states that she is till haivng pressure. Alert x4, no facial drrop.

## 2012-10-07 NOTE — Telephone Encounter (Signed)
Patient in ER now  

## 2012-10-07 NOTE — ED Provider Notes (Signed)
History    CSN: [id number] Arrival date & time 10/07/12  1217  First MD Initiated Contact with Patient 10/07/12 1319     Chief Complaint  Patient presents with  . Headache   (Consider location/radiation/quality/duration/timing/severity/associated sxs/prior Treatment) HPI   Patient presents to the emergency department chief complaint of pressure inside of her head.  She is a past medical history of intermittent vertigo.  States that her vertigo began this past Thursday.  She's been taking meclizine 12.53 times a day for her symptoms.  She denies any nausea or vomiting.  She has associated unsteady gait.  She states that these symptoms have resolved.  Yesterday she began a symptom of pressure inside of her head.  She states it is very difficult to describe.  He denies pain but states that it is almost unbearable. Denies any visual changes, gait disturbances, vertiginous symptoms, nausea, vomiting, facial droop, unilateral weakness.  She denies any pain in her ears.  She is able to history of ear infections.  Patient was seen yesterday by Dr[facility].  She had a glucose CBC and EKG done yesterday that were all normal. Denies fevers, chills, myalgias, arthralgias. Denies DOE, SOB, chest tightness or pressure, radiation to left arm, jaw or back, or diaphoresis. Denies dysuria, flank pain, suprapubic pain, frequency, urgency, or hematuria. Denies headaches, light headedness, weakness, visual disturbances. Denies abdominal pain, nausea, vomiting, diarrhea or constipation.     Past Medical History  Diagnosis Date  . Hypertension   . GERD (gastroesophageal reflux disease)    Past Surgical History  Procedure Laterality Date  . Neck fusion     Family History  Problem Relation Age of Onset  . Heart disease Brother     Pacemaker   History  Substance Use Topics  . Smoking status: Current Some Day Smoker    Types: Cigarettes    Last Attempt to Quit: 04/10/2011   . Smokeless tobacco: Never Used  . Alcohol Use: Yes     Comment: Occassional Use   OB History   Grav Para Term Preterm Abortions TAB SAB Ect Mult Living                 Review of Systems Ten systems are reviewed and are negative for acute change except as noted in the HPI  Allergies  Review of patient's allergies indicates no known allergies.  Home Medications   Current Outpatient Rx  Name  Route  Sig  Dispense  Refill  . acyclovir (ZOVIRAX) 400 MG tablet   Oral   Take 1 tablet (400 mg total) by mouth 3 (three) times daily.   21 tablet   4   . amLODipine (NORVASC) 5 MG tablet      TAKE 1 TABLET (5 MG TOTAL) BY MOUTH DAILY.   90 tablet   2   . diazepam (VALIUM) 2 MG tablet      Take one tab every 6 or 8 hours as needed for dizziness   20 tablet   0   . fluticasone (FLONASE) 50 MCG/ACT nasal spray      Use 2 sprays each nostril twice daily for 3 days, then once daily to try and open eustachian tube   16 g   0   . meclizine (ANTIVERT) 12.5 MG tablet   Oral   Take 1 tablet (12.5 mg total) by mouth 3 (three) times daily as needed.   30 tablet   0   . omeprazole (  PRILOSEC) 40 MG capsule   Oral   Take 1 capsule (40 mg total) by mouth daily.   90 capsule   2   . triamterene-hydrochlorothiazide (MAXZIDE-25) 37.5-25 MG per tablet      TAKE 1 TABLET EVERY DAY   90 tablet   2   . venlafaxine XR (EFFEXOR XR) 37.5 MG 24 hr capsule   Oral   Take 1 capsule (37.5 mg total) by mouth daily.   30 capsule   3   . EXPIRED: fluticasone (FLONASE) 50 MCG/ACT nasal spray   Nasal   Place 2 sprays into the nose daily.   16 g   6   . EXPIRED: omeprazole (PRILOSEC) 40 MG capsule   Oral   Take 1 capsule (40 mg total) by mouth daily.   30 capsule   3    BP 138/82  Pulse 92  Temp(Src) 98.2 F (36.8 C) (Oral)  Resp 16  SpO2 97% Physical Exam Physical Exam  Nursing note and vitals reviewed. Constitutional: She is oriented to person, place, and time. She appears  well-developed and well-nourished. No distress.  HENT:  Head: Normocephalic and atraumatic.  Ears: BL membrane retraction without AOM/OME Eyes: Conjunctivae normal and EOM are normal. Pupils are equal, round, and reactive to light. No scleral icterus.  Neck: Normal range of motion.  Cardiovascular: Normal rate, regular rhythm and normal heart sounds.  Exam reveals no gallop and no friction rub.   No murmur heard. Pulmonary/Chest: Effort normal and breath sounds normal. No respiratory distress.  Abdominal: Soft. Bowel sounds are normal. She exhibits no distension and no mass. There is no tenderness. There is no guarding.  Neurological: She is alert and oriented to person, place, and time.  Speech is clear and goal oriented, follows commands Major Cranial nerves without deficit, no facial droop Normal strength in upper and lower extremities bilaterally including dorsiflexion and plantar flexion, strong and equal grip strength Sensation normal to light and sharp touch Moves extremities without ataxia, coordination intact Normal finger to nose and rapid alternating movements Neg romberg, no pronator drift Normal gait Skin: Skin is warm and dry. She is not diaphoretic.    ED Course  Procedures (including critical care time) Labs Reviewed - No data to display Ct Head Wo Contrast  10/07/2012   *RADIOLOGY REPORT*  Clinical Data: Severe headache.  Dizziness.  CT HEAD WITHOUT CONTRAST  Technique:  Contiguous axial images were obtained from the base of the skull through the vertex without contrast.  Comparison: None.  Findings: There is no acute intracranial hemorrhage, infarction, or mass lesion.  Brain parenchyma is normal.  Osseous structures are normal.  IMPRESSION: Normal exam.   Original Report Authenticated By: Francene Boyers, M.D.   1. Pressure in head     MDM  3:20 PM Patient CT head negative. She became very upset and frustrated when I could not give her an answer for her sxs. I had my  attending physician see her and he feels that she may be having a compicated migraine. Patient will be referred to ENT/ NEUROlogy for further work up.  Patient treated with migraine cocktail for possible  Atypical migraine. States that she is feeling better.   The patient appears reasonably screened and/or stabilized for discharge and I doubt any other medical condition or other [facility] requiring further screening, evaluation, or treatment in the ED at this time prior to discharge.   Arthor Captain, PA-C 10/09/12 1303

## 2012-10-07 NOTE — Telephone Encounter (Signed)
Pt saw dr hopper yesterday and was told to call back and let him know how she was feeling -she believes that it is getting worse and feels she is needing to get a scan of her brain Pt is hoping that this can be done today Best number [phone]

## 2012-10-07 NOTE — Telephone Encounter (Signed)
Needs recheck

## 2012-10-08 ENCOUNTER — Telehealth: Payer: Self-pay | Admitting: Family

## 2012-10-08 NOTE — Telephone Encounter (Signed)
I will refer to neurology but we can also consider a medication at night for prevention if the headaches persist. I do not feel a referral is necessary because we have not attemtped to control the headaches here. Her CT is normal. However, I am willing to refer at patient's request.

## 2012-10-08 NOTE — Telephone Encounter (Signed)
Spoke with patient after call was escalated to me.  Patient states she was seen in the ED on yesterday and diagnosed with migraine.  She is requesting her PCP to review ED records and inform her if she needs to be referred to neurology.  If referral is not required wants to know next steps for treatment.

## 2012-10-08 NOTE — Telephone Encounter (Signed)
Patient Information:  Caller Name: Salli  Phone: [phone]  Patient: Brianna Foster, Brianna Foster  Gender: Female  DOB: [birth year]  Age: 57 Years  PCP: Adline Mango [facility])  Pregnant: No  Office Follow Up:  Does the office need to follow up with this patient?: Yes  Instructions For The Office: Please call back with MD recommendation for follow up and/or referral to Neurologist.  RN Note:  Asking about emergent referral to Neurologitst.  Seen in UC 10/06/12 and ED  10/07/12.  Diagnosed with migraine; ED MD advised to see neurologist.  Intermittent frontal head "pressure" rated 8/10 when occurs; had two brief epoisodes of pain during triage. Pain occurs mostly when up or active. Reports it is "hard to see" during head pain because she clenches eyes due to pain.  Nasal congestion present; denied colored nasal mucus. Asking what medication is recommended to treat the head pain.  RN suggested Tylenol or Ibuprofen as directed. Caffeine is known to help migraine headaches.  Suggested avoid ASA unless directed by MD.    Symptoms  Reason For Call & Symptoms: Frontal head pressure when talking, walking or upright.  Diagnosed with migraine headache in ED 10/07/12.  Vertigo resolved.  Reviewed Health History In EMR: Yes  Reviewed Medications In EMR: Yes  Reviewed Allergies In EMR: Yes  Reviewed Surgeries / Procedures: Yes  Date of Onset of Symptoms: 10/06/2012  Treatments Tried: UC 10/06/12 and ED 10/07/12; Meclazine  Treatments Tried Worked: No OB / GYN:  LMP: Unknown  Guideline(s) Used:  Headache  Disposition Per Guideline:   Callback by PCP or Subspecialist within 1 Hour  Reason For Disposition Reached:   Severe headache and has had severe headaches before  Advice Given:  Reassurance - Migraine Headache:  Migraine headaches are also called vascular headaches. A migraine can be anywhere from mild to severely painful. Sufferers often describe it as throbbing or pulsing. It is often  just on one side. Associated symptoms include nausea and vomiting. Some individuals will have visual or other neurological warning symptoms (aura) that a migraine is coming.  Pain Medicines:  For pain relief, you can take either acetaminophen, ibuprofen, or naproxen.  They are over-the-counter (OTC) pain drugs. You can buy them at the drugstore.  Rest:   Lie down in a dark, quiet place and try to relax. Close your eyes and imagine your entire body relaxing.  Apply Cold to the Area:   Apply a cold wet washcloth or cold pack to the forehead for 20 minutes.  Call Back If:  You become worse.  Patient Will Follow Care Advice:  YES

## 2012-10-08 NOTE — Telephone Encounter (Signed)
ED follow up appt scheduled with PCP, ok per [facility].

## 2012-10-09 ENCOUNTER — Ambulatory Visit: Payer: BC Managed Care – PPO | Admitting: Family

## 2012-10-10 NOTE — ED Provider Notes (Signed)
Medical screening examination/treatment/procedure(s) were conducted as a shared visit with non-physician practitioner(s) and myself.  I personally evaluated the patient during the encounter  Brianna Foster is a 57 y.o. female here with pressure on R side of her head. She has intermittent vertigo before. She was seen at [facility] medicine clinic yesterday and was prescribed meclizine that hasn't helped. Denies weakness or vision changes. No history of migraines. Neuro exam unremarkable. Bilateral TM unremarkable. CT head nl. I think she might have a migraine. Given migraine cocktail and felt better. Will f/u outpatient    Richardean Canal, MD 10/10/12 0700

## 2012-10-13 ENCOUNTER — Ambulatory Visit: Payer: BC Managed Care – PPO | Admitting: Family

## 2012-10-20 ENCOUNTER — Encounter: Payer: Self-pay | Admitting: Internal Medicine

## 2012-10-27 ENCOUNTER — Other Ambulatory Visit: Payer: Self-pay

## 2012-10-27 DIAGNOSIS — Z1231 Encounter for screening mammogram for malignant neoplasm of breast: Secondary | ICD-10-CM

## 2012-11-09 ENCOUNTER — Other Ambulatory Visit: Payer: Self-pay | Admitting: Family

## 2012-11-10 ENCOUNTER — Ambulatory Visit: Payer: BC Managed Care – PPO

## 2012-11-12 ENCOUNTER — Ambulatory Visit: Payer: BC Managed Care – PPO

## 2012-12-02 ENCOUNTER — Ambulatory Visit: Payer: BC Managed Care – PPO

## 2012-12-23 ENCOUNTER — Ambulatory Visit
Admission: RE | Admit: 2012-12-23 | Discharge: 2012-12-23 | Disposition: A | Discharge status: Home / Self Care | Payer: BC Managed Care – PPO | Source: Ambulatory Visit

## 2012-12-23 DIAGNOSIS — Z1231 Encounter for screening mammogram for malignant neoplasm of breast: Secondary | ICD-10-CM

## 2012-12-25 ENCOUNTER — Encounter: Payer: BC Managed Care – PPO | Admitting: Internal Medicine

## 2013-02-16 ENCOUNTER — Other Ambulatory Visit: Payer: Self-pay | Admitting: Family

## 2013-05-10 ENCOUNTER — Emergency Department (HOSPITAL_COMMUNITY): Payer: No Typology Code available for payment source

## 2013-05-10 ENCOUNTER — Other Ambulatory Visit: Payer: Self-pay

## 2013-05-10 ENCOUNTER — Observation Stay (HOSPITAL_COMMUNITY)
Admission: EM | Admit: 2013-05-10 | Discharge: 2013-05-12 | Disposition: A | Discharge status: Home / Self Care | Payer: No Typology Code available for payment source | Attending: Internal Medicine | Admitting: Internal Medicine

## 2013-05-10 ENCOUNTER — Encounter (HOSPITAL_COMMUNITY): Payer: Self-pay | Admitting: Emergency Medicine

## 2013-05-10 DIAGNOSIS — F17200 Nicotine dependence, unspecified, uncomplicated: Secondary | ICD-10-CM | POA: Insufficient documentation

## 2013-05-10 DIAGNOSIS — Z79899 Other long term (current) drug therapy: Secondary | ICD-10-CM | POA: Insufficient documentation

## 2013-05-10 DIAGNOSIS — Z720 Tobacco use: Secondary | ICD-10-CM | POA: Diagnosis present

## 2013-05-10 DIAGNOSIS — I10 Essential (primary) hypertension: Secondary | ICD-10-CM | POA: Insufficient documentation

## 2013-05-10 DIAGNOSIS — R0602 Shortness of breath: Secondary | ICD-10-CM | POA: Insufficient documentation

## 2013-05-10 DIAGNOSIS — K219 Gastro-esophageal reflux disease without esophagitis: Secondary | ICD-10-CM | POA: Insufficient documentation

## 2013-05-10 DIAGNOSIS — R079 Chest pain, unspecified: Principal | ICD-10-CM | POA: Insufficient documentation

## 2013-05-10 DIAGNOSIS — Z7982 Long term (current) use of aspirin: Secondary | ICD-10-CM | POA: Insufficient documentation

## 2013-05-10 DIAGNOSIS — I2510 Atherosclerotic heart disease of native coronary artery without angina pectoris: Secondary | ICD-10-CM

## 2013-05-10 HISTORY — DX: Headache: R51

## 2013-05-10 LAB — CREATININE, SERUM
CREATININE: 0.77 mg/dL (ref 0.50–1.10)
GFR calc Af Amer: >90 mL/min (ref >90)
GFR calc non Af Amer: >90 mL/min (ref >90)

## 2013-05-10 LAB — CBC
HCT: 41.1 % (ref 36.0–46.0)
HEMATOCRIT: 38.7 % (ref 36.0–46.0)
HEMOGLOBIN: 14.3 g/dL (ref 12.0–15.0)
HEMOGLOBIN: 13.4 g/dL (ref 12.0–15.0)
MCH: 30.6 pg (ref 26.0–34.0)
MCH: 30.7 pg (ref 26.0–34.0)
MCHC: 34.8 g/dL (ref 30.0–36.0)
MCHC: 34.6 g/dL (ref 30.0–36.0)
MCV: 87.8 fL (ref 78.0–100.0)
MCV: 88.8 fL (ref 78.0–100.0)
PLATELETS: 280 10*3/uL (ref 150–400)
Platelets: 264 10*3/uL (ref 150–400)
RBC: 4.68 MIL/uL (ref 3.87–5.11)
RBC: 4.36 MIL/uL (ref 3.87–5.11)
RDW: 13 % (ref 11.5–15.5)
RDW: 13 % (ref 11.5–15.5)
WBC: 5.8 10*3/uL (ref 4.0–10.5)
WBC: 6.2 10*3/uL (ref 4.0–10.5)

## 2013-05-10 LAB — TROPONIN I: Troponin I: <0.3 ng/mL (ref <0.30)

## 2013-05-10 LAB — POCT I-STAT TROPONIN I: TROPONIN I, POC: 0 ng/mL (ref 0.00–0.08)

## 2013-05-10 LAB — BASIC METABOLIC PANEL
BUN: 16 mg/dL (ref 6–23)
CO2: 26 mEq/L (ref 19–32)
Calcium: 9.4 mg/dL (ref 8.4–10.5)
Chloride: 101 mEq/L (ref 96–112)
Creatinine, Ser: 0.71 mg/dL (ref 0.50–1.10)
GFR calc Af Amer: >90 mL/min (ref >90)
GLUCOSE: 109 mg/dL — AB (ref 70–99)
POTASSIUM: 4 meq/L (ref 3.7–5.3)
SODIUM: 140 meq/L (ref 137–147)

## 2013-05-10 MED ORDER — ONDANSETRON HCL 4 MG/2ML IJ SOLN: 4.0000 mg | Freq: Four times a day (QID) | INTRAMUSCULAR | Status: DC | PRN

## 2013-05-10 MED ORDER — MORPHINE SULFATE 2 MG/ML IJ SOLN
2.0000 mg | Freq: Once | INTRAMUSCULAR | Status: AC
  Administered 2013-05-10: 2 mg via INTRAVENOUS
  Filled 2013-05-10: qty 1

## 2013-05-10 MED ORDER — ENOXAPARIN SODIUM 40 MG/0.4ML ~~LOC~~ SOLN
40.0000 mg | SUBCUTANEOUS | Status: DC
  Administered 2013-05-10: 40 mg via SUBCUTANEOUS
  Filled 2013-05-10 (×3): qty 0.4

## 2013-05-10 MED ORDER — ASPIRIN EC 325 MG PO TBEC
325.0000 mg | DELAYED_RELEASE_TABLET | Freq: Every day | ORAL | Status: DC
  Administered 2013-05-12: 325 mg via ORAL
  Filled 2013-05-10: qty 1

## 2013-05-10 MED ORDER — ONDANSETRON HCL 4 MG PO TABS: 4.0000 mg | ORAL_TABLET | Freq: Four times a day (QID) | ORAL | Status: DC | PRN

## 2013-05-10 MED ORDER — TRIAMTERENE-HCTZ 37.5-25 MG PO TABS
1.0000 | ORAL_TABLET | Freq: Every day | ORAL | Status: DC
  Administered 2013-05-11 – 2013-05-12 (×2): 1 via ORAL
  Filled 2013-05-10 (×2): qty 1

## 2013-05-10 MED ORDER — CYCLOBENZAPRINE HCL 10 MG PO TABS
10.0000 mg | ORAL_TABLET | Freq: Three times a day (TID) | ORAL | Status: DC
  Administered 2013-05-10 – 2013-05-11 (×4): 10 mg via ORAL
  Filled 2013-05-10 (×9): qty 1

## 2013-05-10 MED ORDER — PANTOPRAZOLE SODIUM 40 MG PO TBEC
40.0000 mg | DELAYED_RELEASE_TABLET | Freq: Every day | ORAL | Status: DC
  Administered 2013-05-10 – 2013-05-12 (×3): 40 mg via ORAL
  Filled 2013-05-10 (×3): qty 1

## 2013-05-10 MED ORDER — AMLODIPINE BESYLATE 5 MG PO TABS
5.0000 mg | ORAL_TABLET | Freq: Every day | ORAL | Status: DC
  Administered 2013-05-11 – 2013-05-12 (×2): 5 mg via ORAL
  Filled 2013-05-10 (×2): qty 1

## 2013-05-10 MED ORDER — SODIUM CHLORIDE 0.9 % IV BOLUS (SEPSIS)
1000.0000 mL | Freq: Once | INTRAVENOUS | Status: AC
  Administered 2013-05-10: 1000 mL via INTRAVENOUS

## 2013-05-10 MED ORDER — OMEGA-3-ACID ETHYL ESTERS 1 G PO CAPS
1.0000 g | ORAL_CAPSULE | Freq: Every day | ORAL | Status: DC
  Administered 2013-05-11 – 2013-05-12 (×2): 1 g via ORAL
  Filled 2013-05-10 (×2): qty 1

## 2013-05-10 MED ORDER — ZOLPIDEM TARTRATE 5 MG PO TABS
5.0000 mg | ORAL_TABLET | Freq: Once | ORAL | Status: AC
  Administered 2013-05-10: 5 mg via ORAL
  Filled 2013-05-10: qty 1

## 2013-05-10 MED ORDER — ONDANSETRON HCL 4 MG/2ML IJ SOLN
4.0000 mg | Freq: Once | INTRAMUSCULAR | Status: AC
  Administered 2013-05-10: 4 mg via INTRAVENOUS
  Filled 2013-05-10: qty 2

## 2013-05-10 MED ORDER — SODIUM CHLORIDE 0.9 % IJ SOLN
3.0000 mL | Freq: Two times a day (BID) | INTRAMUSCULAR | Status: DC
  Administered 2013-05-10 – 2013-05-12 (×3): 3 mL via INTRAVENOUS

## 2013-05-10 MED ORDER — SODIUM CHLORIDE 0.9 % IJ SOLN: 3.0000 mL | INTRAMUSCULAR | Status: DC | PRN

## 2013-05-10 MED ORDER — ALUM & MAG HYDROXIDE-SIMETH 200-200-20 MG/5ML PO SUSP: 30.0000 mL | Freq: Four times a day (QID) | ORAL | Status: DC | PRN

## 2013-05-10 MED ORDER — HYDROCODONE-ACETAMINOPHEN 5-325 MG PO TABS
1.0000 | ORAL_TABLET | ORAL | Status: DC | PRN
  Administered 2013-05-10: 1 via ORAL
  Administered 2013-05-10 – 2013-05-11 (×2): 2 via ORAL
  Filled 2013-05-10 (×2): qty 2
  Filled 2013-05-10: qty 1

## 2013-05-10 MED ORDER — SODIUM CHLORIDE 0.9 % IV SOLN: 250.0000 mL | INTRAVENOUS | Status: DC | PRN

## 2013-05-10 MED ORDER — ASPIRIN 325 MG PO TABS
325.0000 mg | ORAL_TABLET | Freq: Once | ORAL | Status: AC
  Administered 2013-05-10: 325 mg via ORAL
  Filled 2013-05-10: qty 1

## 2013-05-10 NOTE — H&P (Signed)
PCP:   CAMPBELL, PADONDA BOYD, FNP   Chief Complaint:  Chest pain  HPI:  58 year old female who  has a past medical history of Hypertension and GERD (gastroesophageal reflux disease). Today presented to the ED with chest pain which started last night, as per patient the pain was located in the left side under her breast and did not radiate anywhere it was associated with shortness of breath and also had profuse sweating. She denies palpitations, no nausea vomiting or diarrhea. She denies fever. Patient is a smoker and smokes one pack per week, she does not have significant family history of heart disease. Patient does have a history of hypertension.    Allergies:  No Known Allergies    Past Medical History  Diagnosis Date  . Hypertension   . GERD (gastroesophageal reflux disease)     Past Surgical History  Procedure Laterality Date  . Neck fusion      Prior to Admission medications   Medication Sig Start Date End Date Taking? Authorizing Provider  amLODipine (NORVASC) 5 MG tablet Take 5 mg by mouth daily.   Yes Historical Provider, MD  Cholecalciferol (VITAMIN D PO) Take 1 tablet by mouth daily.   Yes Historical Provider, MD  omega-3 acid ethyl esters (LOVAZA) 1 G capsule Take 1 g by mouth daily.   Yes Historical Provider, MD  omeprazole (PRILOSEC) 40 MG capsule Take 1 capsule (40 mg total) by mouth daily. 07/23/12  Yes Timoteo Gaul, FNP  triamterene-hydrochlorothiazide (MAXZIDE-25) 37.5-25 MG per tablet Take 1 tablet by mouth daily.   Yes Historical Provider, MD    Social History:  reports that she has been smoking Cigarettes.  She has been smoking about 0.00 packs per day. She has never used smokeless tobacco. She reports that she drinks alcohol. She reports that she does not use illicit drugs.  Family History  Problem Relation Age of Onset  . Heart disease Brother     Pacemaker     All the positives are listed in BOLD  Review of Systems:  HEENT: Headache,  blurred vision, runny nose, sore throat Neck: Hypothyroidism, hyperthyroidism,,lymphadenopathy Chest : Shortness of breath, history of COPD, Asthma Heart : Chest pain, history of coronary arterey disease GI:  Nausea, vomiting, diarrhea, constipation, GERD GU: Dysuria, urgency, frequency of urination, hematuria Neuro: Stroke, seizures, syncope Psych: Depression, anxiety, hallucinations   Physical Exam: Blood pressure 151/86, pulse 70, temperature 98.1 F (36.7 C), temperature source Oral, resp. rate 12, height $RemoveBe'5\' 9"'MGYhRsqME$  (1.753 m), weight 99.02 kg (218 lb 4.8 oz), SpO2 95.00%. Constitutional:   Patient is a well-developed and well-nourished female* in no acute distress and cooperative with exam. Head: Normocephalic and atraumatic Mouth: Mucus membranes moist Eyes: PERRL, EOMI, conjunctivae normal Neck: Supple, No Thyromegaly Cardiovascular: RRR, S1 normal, S2 normal Pulmonary/Chest: CTAB, no wheezes, rales, or rhonchi, positive chest wall tenderness under the left breast Abdominal: Soft. Non-tender, non-distended, bowel sounds are normal, no masses, organomegaly, or guarding present.  Neurological: A&O x3, Strenght is normal and symmetric bilaterally, cranial nerve II-XII are grossly intact, no focal motor deficit, sensory intact to light touch bilaterally.  Extremities : No Cyanosis, Clubbing or Edema   Labs on Admission:  Results for orders placed during the hospital encounter of 05/10/13 (from the past 48 hour(s))  CBC     Status: None   Collection Time    05/10/13 10:41 AM      Result Value Range   WBC 5.8  4.0 - 10.5 K/uL  RBC 4.68  3.87 - 5.11 MIL/uL   Hemoglobin 14.3  12.0 - 15.0 g/dL   HCT 41.1  36.0 - 46.0 %   MCV 87.8  78.0 - 100.0 fL   MCH 30.6  26.0 - 34.0 pg   MCHC 34.8  30.0 - 36.0 g/dL   RDW 13.0  11.5 - 15.5 %   Platelets 280  150 - 400 K/uL  BASIC METABOLIC PANEL     Status: Abnormal   Collection Time    05/10/13 10:41 AM      Result Value Range   Sodium 140   137 - 147 mEq/L   Potassium 4.0  3.7 - 5.3 mEq/L   Chloride 101  96 - 112 mEq/L   CO2 26  19 - 32 mEq/L   Glucose, Bld 109 (*) 70 - 99 mg/dL   BUN 16  6 - 23 mg/dL   Creatinine, Ser 0.71  0.50 - 1.10 mg/dL   Calcium 9.4  8.4 - 10.5 mg/dL   GFR calc non Af Amer >90  >90 mL/min   GFR calc Af Amer >90  >90 mL/min   Comment: (NOTE)     The eGFR has been calculated using the CKD EPI equation.     This calculation has not been validated in all clinical situations.     eGFR's persistently <90 mL/min signify possible Chronic Kidney     Disease.  POCT I-STAT TROPONIN I     Status: None   Collection Time    05/10/13 11:07 AM      Result Value Range   Troponin i, poc 0.00  0.00 - 0.08 ng/mL   Comment 3            Comment: Due to the release kinetics of cTnI,     a negative result within the first hours     of the onset of symptoms does not rule out     myocardial infarction with certainty.     If myocardial infarction is still suspected,     repeat the test at appropriate intervals.  TROPONIN I     Status: None   Collection Time    05/10/13 11:36 AM      Result Value Range   Troponin I <0.30  <0.30 ng/mL   Comment:            Due to the release kinetics of cTnI,     a negative result within the first hours     of the onset of symptoms does not rule out     myocardial infarction with certainty.     If myocardial infarction is still suspected,     repeat the test at appropriate intervals.    Radiological Exams on Admission: Dg Chest 2 View  05/10/2013   CLINICAL DATA:  Pain under left breast.  EXAM: CHEST  2 VIEW  COMPARISON:  None.  FINDINGS: Cardiac silhouette is normal in size. Normal mediastinal and hilar contours. Clear lungs. No pleural effusion or pneumothorax.  Status post low anterior cervical spine fusion. Bony thorax is intact.  IMPRESSION: No active cardiopulmonary disease.   Electronically Signed   By: Lajean Manes M.D.   On: 05/10/2013 12:40     Assessment/Plan Principal Problem:   Chest pain Active Problems:   Gastroesophageal reflux disease   Hypertension   Tobacco abuse  Chest pain Will admit the patient under observation in telemetry with 3 obtain 3 sets of cardiac enzymes. EKG shows normal sinus  rhythm, troponin in the ED is negative.  GERD Will continue with Protonix daily  Hypertension Continue Maxide 25 mg daily  DVT prophylaxis Lovenox   Code status: Patient is full code  Family discussion: No family at bedside   Time Spent on Admission: 60 minutes  [facility] Pager: [phone] 05/10/2013, 2:13 PM  If 7PM-7AM, please contact night-coverage  [email or web address]  Password [email or web address]

## 2013-05-10 NOTE — ED Notes (Signed)
Pt reports L sided chest pain that woke her from her sleep last night, constant since onset, describes as an "annoying ache under my ribcage." pain is relieved with stretching. Denies sob. The pain did make her feel sweaty. A&Ox4, resp e/u

## 2013-05-10 NOTE — ED Notes (Signed)
5 lead not found in bed, Xray notified will search

## 2013-05-10 NOTE — ED Provider Notes (Signed)
CSN: [id number]     Arrival date & time 05/10/13  1017 History   First MD Initiated Contact with Patient 05/10/13 1113     Chief Complaint  Patient presents with  . Chest Pain   (Consider location/radiation/quality/duration/timing/severity/associated sxs/prior Treatment) The history is provided by the patient. No language interpreter was used.  Brianna Foster is a 58 year old female with past medical history of hypertension and GERD presenting to emergency department with chest pain that started this morning. Patient reported that the chest pain started abruptly and woke her out of a sound sleep. Stated that the chest pain is localized to the left side of the chest underneath her breast described as a dull, knawing sensation that is constant without radiation. Stated that this pain woke her out of sleep-noted that during the episode of chest pain she was sweating a lot, reported that she was drenched in sweat. Stated that approximately 9:00 AM this morning she started to experience shortness of breath associated with the chest pain. Reported that she did not take any medications for the pain. Stated that this is not breast pain-reported mammogram in August 2014 that was negative. Denied nausea, vomiting, diarrhea, breast pain, difficulty breathing, leg swelling, neck pain, jaw pain, fever, chills, hemoptysis, history of blood clots. PCP Dr. Megan Salon  Past Medical History  Diagnosis Date  . Hypertension   . GERD (gastroesophageal reflux disease)    Past Surgical History  Procedure Laterality Date  . Neck fusion     Family History  Problem Relation Age of Onset  . Heart disease Brother     Pacemaker   History  Substance Use Topics  . Smoking status: Current Some Day Smoker    Types: Cigarettes    Last Attempt to Quit: 04/10/2011  . Smokeless tobacco: Never Used  . Alcohol Use: Yes     Comment: Occassional Use   OB History   Grav Para Term Preterm Abortions TAB SAB Ect Mult Living                   Review of Systems  Constitutional: Negative for fever and chills.  Respiratory: Positive for shortness of breath. Negative for cough and chest tightness.   Cardiovascular: Positive for chest pain.  Gastrointestinal: Negative for nausea, vomiting, abdominal pain and diarrhea.  Musculoskeletal: Negative for back pain and neck pain.  Neurological: Negative for dizziness and weakness.  All other systems reviewed and are negative.    Allergies  Review of patient's allergies indicates no known allergies.  Home Medications   Current Outpatient Rx  Name  Route  Sig  Dispense  Refill  . amLODipine (NORVASC) 5 MG tablet   Oral   Take 5 mg by mouth daily.         . Cholecalciferol (VITAMIN D PO)   Oral   Take 1 tablet by mouth daily.         [name] omega-3 acid ethyl esters (LOVAZA) 1 G capsule   Oral   Take 1 g by mouth daily.         [name] omeprazole (PRILOSEC) 40 MG capsule   Oral   Take 1 capsule (40 mg total) by mouth daily.   90 capsule   2   . triamterene-hydrochlorothiazide (MAXZIDE-25) 37.5-25 MG per tablet   Oral   Take 1 tablet by mouth daily.          BP 151/86  Pulse 70  Temp(Src) 98.1 F (36.7 C) (Oral)  Resp 12  Ht 5\' 9"  (1.753 m)  Wt 218 lb 4.8 oz (99.02 kg)  BMI 32.22 kg/m2  SpO2 95% Physical Exam  Nursing note and vitals reviewed. Constitutional: She is oriented to person, place, and time. She appears well-developed and well-nourished. No distress.  HENT:  Head: Normocephalic and atraumatic.  Mouth/Throat: Oropharynx is clear and moist. No oropharyngeal exudate.  Eyes: Conjunctivae and EOM are normal. Pupils are equal, round, and reactive to light. Right eye exhibits no discharge. Left eye exhibits no discharge.  Neck: Normal range of motion. Neck supple. No tracheal deviation present.  Cardiovascular: Normal rate, regular rhythm and normal heart sounds.  Exam reveals no friction rub.   No murmur heard. Pulses:      Radial pulses  are 2+ on the right side, and 2+ on the left side.       Dorsalis pedis pulses are 2+ on the right side, and 2+ on the left side.  Cap refill less than 3 seconds Negative swelling or pitting edema noted to bilateral lower extremities Negative calf pain bilaterally  Pulmonary/Chest: Effort normal and breath sounds normal. No respiratory distress. She has no wheezes. She has no rales. She exhibits tenderness.    Patient is able to speak in full sentences without difficulty Negative use of accessory muscles Mild discomfort upon palpation to the left side of the chest, just underneath the left breast  Musculoskeletal: Normal range of motion.  Full ROM to upper and lower extremities without difficulty noted, negative ataxia noted.  Lymphadenopathy:    She has no cervical adenopathy.  Neurological: She is alert and oriented to person, place, and time. No cranial nerve deficit. She exhibits normal muscle tone. Coordination normal.  Cranial nerves III-XII grossly intact Strength 5+/5+ to upper and lower extremities bilaterally with resistance applied, equal distribution noted  Skin: Skin is warm and dry. No rash noted. She is not diaphoretic. No erythema.  Psychiatric: She has a normal mood and affect. Her behavior is normal. Thought content normal.    ED Course  Procedures (including critical care time)  1:36 PM This provider spoke with the patient regarding labs and imaging. Discussed with patient plan for admission to rule out cardiac issue, cannot rule out ACS. Patient agreed.   1:48 PM This provider spoke with Dr. Sharl Ma from [facility] - discussed case, history, presentation, labs and imaging. Patient to be admitted to the hospital for cardiac rule out - admitted to Telemetry for observation.  Results for orders placed during the hospital encounter of 05/10/13  CBC      Result Value Range   WBC 5.8  4.0 - 10.5 K/uL   RBC 4.68  3.87 - 5.11 MIL/uL   Hemoglobin 14.3  12.0 - 15.0  g/dL   HCT 21.3  08.6 - 57.8 %   MCV 87.8  78.0 - 100.0 fL   MCH 30.6  26.0 - 34.0 pg   MCHC 34.8  30.0 - 36.0 g/dL   RDW 46.9  62.9 - 52.8 %   Platelets 280  150 - 400 K/uL  BASIC METABOLIC PANEL      Result Value Range   Sodium 140  137 - 147 mEq/L   Potassium 4.0  3.7 - 5.3 mEq/L   Chloride 101  96 - 112 mEq/L   CO2 26  19 - 32 mEq/L   Glucose, Bld 109 (*) 70 - 99 mg/dL   BUN 16  6 - 23 mg/dL   Creatinine, Ser 4.13  0.50 -  1.10 mg/dL   Calcium 9.4  8.4 - 10.5 mg/dL   GFR calc non Af Amer >90  >90 mL/min   GFR calc Af Amer >90  >90 mL/min  TROPONIN I      Result Value Range   Troponin I <0.30  <0.30 ng/mL  POCT I-STAT TROPONIN I      Result Value Range   Troponin i, poc 0.00  0.00 - 0.08 ng/mL   Comment 3            Dg Chest 2 View  05/10/2013   CLINICAL DATA:  Pain under left breast.  EXAM: CHEST  2 VIEW  COMPARISON:  None.  FINDINGS: Cardiac silhouette is normal in size. Normal mediastinal and hilar contours. Clear lungs. No pleural effusion or pneumothorax.  Status post low anterior cervical spine fusion. Bony thorax is intact.  IMPRESSION: No active cardiopulmonary disease.   Electronically Signed   By: Lajean Manes M.D.   On: 05/10/2013 12:40   Labs Review Labs Reviewed  BASIC METABOLIC PANEL - Abnormal; Notable for the following:    Glucose, Bld 109 (*)    All other components within normal limits  CBC  TROPONIN I  POCT I-STAT TROPONIN I   Imaging Review Dg Chest 2 View  05/10/2013   CLINICAL DATA:  Pain under left breast.  EXAM: CHEST  2 VIEW  COMPARISON:  None.  FINDINGS: Cardiac silhouette is normal in size. Normal mediastinal and hilar contours. Clear lungs. No pleural effusion or pneumothorax.  Status post low anterior cervical spine fusion. Bony thorax is intact.  IMPRESSION: No active cardiopulmonary disease.   Electronically Signed   By: Lajean Manes M.D.   On: 05/10/2013 12:40    EKG Interpretation    Date/Time:  Sunday May 10 2013 10:23:53  EST Ventricular Rate:  74 PR Interval:  162 QRS Duration: 88 QT Interval:  384 QTC Calculation: 426 R Axis:   37 Text Interpretation:  Normal sinus rhythm with sinus arrhythmia Right atrial enlargement Borderline ECG No old tracing to compare Confirmed by GOLDSTON  MD, SCOTT ([id number]) on 05/10/2013 1:36:21 PM            MDM   1. Chest pain     Medications  sodium chloride 0.9 % bolus 1,000 mL (1,000 mLs Intravenous New Bag/Given 05/10/13 1256)  aspirin tablet 325 mg (325 mg Oral Given 05/10/13 1256)  morphine 2 MG/ML injection 2 mg (2 mg Intravenous Given 05/10/13 1340)  ondansetron (ZOFRAN) injection 4 mg (4 mg Intravenous Given 05/10/13 1339)   Filed Vitals:   05/10/13 1033 05/10/13 1145 05/10/13 1215 05/10/13 1343  BP: 153/77 138/74 148/85 151/86  Pulse: 86 68 65 70  Temp: 98.1 F (36.7 C)     TempSrc: Oral     Resp: 22 13 11 12   Height: 5\' 9"  (1.753 m)     Weight: 218 lb 4.8 oz (99.02 kg)     SpO2: 97% 98% 97% 95%    Patient presenting to emergency department with chest pain that started abruptly this morning-woke patient out of a sound sleep. Stated that is localized to the left side of the chest, underneath the left breast without radiation described as a dull aching, knawing sensation that is constant this morning but has now become intermittent. Reported that she's been experiencing episodes of shortness of breath associated with the chest pain. Reported that she's been having diaphoresis associated with this chest pain. Reported that she had chest pain episode a  couple years ago where she was taken to the Cath Lab that identified plaque in her artery. This provider reviewed patient's chart thoroughly, was unable to find a note regarding this Cath Lab experience. Alert oriented. GCS 15. Heart rate and rhythm normal. Lungs clear to auscultation. Good lung expansion. Negative use of excess her muscles. Negative respiratory distress noted. Cap refill less than 3 seconds. Radial and  DP pulses 2+ bilaterally. Negative swelling or pitting edema noted to the lower extremities bilaterally. Discomfort noted upon palpation to the left side of the chest, underneath the left breast-minimal discomfort. Full range of motion to upper and lower extremities without difficulty. Strength intact with equal distribution. Equal grip strength. EKG noted normal sinus rhythm with a heart rate of 74 beats per minute - negative ischemic findings noted. First troponin negative elevation. CBC negative findings. BMP negative findings. Chest x-ray negative for acute cardiopulmonary disease. Doubt CHF exacerbation. Negative findings for pneumonia. Doubt dissection. Negative findings for pneumothorax. Doubt PE. Cannot rule out ACS. Patient to be admitted to hospital for cardiac rule out-discussed case, history, presentation, labs and imaging with [facility]-Dr. Lama-patient to be admitted to telemetry for observation. Discussed with patient plan for admission, patient plan. Patient stable for transfer.    Jamse Mead, PA-C 05/11/13 1127

## 2013-05-10 NOTE — ED Notes (Signed)
Patient transported to X-ray 

## 2013-05-11 ENCOUNTER — Encounter (HOSPITAL_COMMUNITY)
Admission: EM | Disposition: A | Discharge status: Home / Self Care | Payer: Self-pay | Source: Home / Self Care | Attending: Emergency Medicine

## 2013-05-11 ENCOUNTER — Encounter (HOSPITAL_COMMUNITY): Payer: Self-pay | Admitting: *Deleted

## 2013-05-11 DIAGNOSIS — F17200 Nicotine dependence, unspecified, uncomplicated: Secondary | ICD-10-CM

## 2013-05-11 DIAGNOSIS — I2510 Atherosclerotic heart disease of native coronary artery without angina pectoris: Secondary | ICD-10-CM

## 2013-05-11 DIAGNOSIS — R079 Chest pain, unspecified: Secondary | ICD-10-CM

## 2013-05-11 HISTORY — PX: LEFT HEART CATHETERIZATION WITH CORONARY ANGIOGRAM: SHX5451

## 2013-05-11 LAB — LIPID PANEL
Cholesterol: 180 mg/dL (ref 0–200)
HDL: 58 mg/dL (ref >39)
LDL Cholesterol: 98 mg/dL (ref 0–99)
Total CHOL/HDL Ratio: 3.1 RATIO
Triglycerides: 121 mg/dL (ref <150)
VLDL: 24 mg/dL (ref 0–40)

## 2013-05-11 LAB — COMPREHENSIVE METABOLIC PANEL
ALK PHOS: 89 U/L (ref 39–117)
ALT: 22 U/L (ref 0–35)
AST: 19 U/L (ref 0–37)
Albumin: 3.6 g/dL (ref 3.5–5.2)
BUN: 14 mg/dL (ref 6–23)
CO2: 26 meq/L (ref 19–32)
Calcium: 8.8 mg/dL (ref 8.4–10.5)
Chloride: 101 mEq/L (ref 96–112)
Creatinine, Ser: 0.68 mg/dL (ref 0.50–1.10)
GFR calc Af Amer: >90 mL/min (ref >90)
GFR calc non Af Amer: >90 mL/min (ref >90)
GLUCOSE: 97 mg/dL (ref 70–99)
Potassium: 3.9 mEq/L (ref 3.7–5.3)
SODIUM: 141 meq/L (ref 137–147)
Total Bilirubin: 0.3 mg/dL (ref 0.3–1.2)
Total Protein: 7.3 g/dL (ref 6.0–8.3)

## 2013-05-11 LAB — CBC
HCT: 37.6 % (ref 36.0–46.0)
HEMOGLOBIN: 12.9 g/dL (ref 12.0–15.0)
MCH: 30.3 pg (ref 26.0–34.0)
MCHC: 34.3 g/dL (ref 30.0–36.0)
MCV: 88.3 fL (ref 78.0–100.0)
Platelets: 258 10*3/uL (ref 150–400)
RBC: 4.26 MIL/uL (ref 3.87–5.11)
RDW: 13 % (ref 11.5–15.5)
WBC: 5.3 10*3/uL (ref 4.0–10.5)

## 2013-05-11 LAB — PROTIME-INR
INR: 0.95 (ref 0.00–1.49)
Prothrombin Time: 12.5 seconds (ref 11.6–15.2)

## 2013-05-11 LAB — APTT: aPTT: 26 seconds (ref 24–37)

## 2013-05-11 LAB — TROPONIN I

## 2013-05-11 SURGERY — LEFT HEART CATHETERIZATION WITH CORONARY ANGIOGRAM
Anesthesia: LOCAL

## 2013-05-11 MED ORDER — SODIUM CHLORIDE 0.9 % IJ SOLN: 3.0000 mL | Freq: Two times a day (BID) | INTRAMUSCULAR | Status: DC

## 2013-05-11 MED ORDER — MIDAZOLAM HCL 2 MG/2ML IJ SOLN
INTRAMUSCULAR | Status: AC
  Filled 2013-05-11: qty 2

## 2013-05-11 MED ORDER — FENTANYL CITRATE 0.05 MG/ML IJ SOLN
INTRAMUSCULAR | Status: AC
  Filled 2013-05-11: qty 2

## 2013-05-11 MED ORDER — ACETAMINOPHEN 325 MG PO TABS: 650.0000 mg | ORAL_TABLET | ORAL | Status: DC | PRN

## 2013-05-11 MED ORDER — OXYCODONE-ACETAMINOPHEN 5-325 MG PO TABS
1.0000 | ORAL_TABLET | ORAL | Status: DC | PRN
  Administered 2013-05-11: 2 via ORAL
  Filled 2013-05-11: qty 2

## 2013-05-11 MED ORDER — SODIUM CHLORIDE 0.9 % IV SOLN
INTRAVENOUS | Status: DC
  Administered 2013-05-11: 11:00:00 via INTRAVENOUS

## 2013-05-11 MED ORDER — SODIUM CHLORIDE 0.9 % IV SOLN: INTRAVENOUS | Status: DC

## 2013-05-11 MED ORDER — SODIUM CHLORIDE 0.45 % IV SOLN: INTRAVENOUS | Status: AC

## 2013-05-11 MED ORDER — ASPIRIN 81 MG PO CHEW
81.0000 mg | CHEWABLE_TABLET | ORAL | Status: AC
  Administered 2013-05-11: 81 mg via ORAL
  Filled 2013-05-11: qty 1

## 2013-05-11 MED ORDER — SODIUM CHLORIDE 0.9 % IV SOLN: 250.0000 mL | INTRAVENOUS | Status: DC | PRN

## 2013-05-11 MED ORDER — LIDOCAINE HCL (PF) 1 % IJ SOLN
INTRAMUSCULAR | Status: AC
  Filled 2013-05-11: qty 30

## 2013-05-11 MED ORDER — DIAZEPAM 2 MG PO TABS
2.0000 mg | ORAL_TABLET | Freq: Three times a day (TID) | ORAL | Status: DC | PRN
  Administered 2013-05-11: 2 mg via ORAL
  Filled 2013-05-11: qty 1

## 2013-05-11 MED ORDER — VERAPAMIL HCL 2.5 MG/ML IV SOLN
INTRAVENOUS | Status: AC
  Filled 2013-05-11: qty 2

## 2013-05-11 MED ORDER — ONDANSETRON HCL 4 MG/2ML IJ SOLN: 4.0000 mg | Freq: Four times a day (QID) | INTRAMUSCULAR | Status: DC | PRN

## 2013-05-11 MED ORDER — NITROGLYCERIN 0.2 MG/ML ON CALL CATH LAB
INTRAVENOUS | Status: AC
  Filled 2013-05-11: qty 1

## 2013-05-11 MED ORDER — SODIUM CHLORIDE 0.9 % IJ SOLN: 3.0000 mL | INTRAMUSCULAR | Status: DC | PRN

## 2013-05-11 MED ORDER — HEPARIN SODIUM (PORCINE) 1000 UNIT/ML IJ SOLN
INTRAMUSCULAR | Status: AC
  Filled 2013-05-11: qty 1

## 2013-05-11 MED ORDER — HEPARIN (PORCINE) IN NACL 2-0.9 UNIT/ML-% IJ SOLN
INTRAMUSCULAR | Status: AC
  Filled 2013-05-11: qty 1500

## 2013-05-11 NOTE — Progress Notes (Signed)
[facility] PROGRESS NOTE  Brianna Foster [id number] DOB: July 18, 1955 DOA: 05/10/2013 PCP: Donia Ast, FNP  Assessment/Plan: Chest Pain -With history of mild CAD. -ACS ruled out. -For cath today. -Appreciate cards recommendations.  Tobacco Abuse -Cessation counseling provided.  Code Status: Full code Family Communication: patient only  Disposition Plan: home pending cath results   Consultants:  cardiology   Antibiotics:  none   Subjective: No complaints. CP resolved  Objective: Filed Vitals:   05/10/13 1430 05/10/13 2100 05/11/13 0500 05/11/13 1319  BP: 121/70 127/67 129/75 147/79  Pulse: 73 75 73 63  Temp:  98 F (36.7 C) 97.4 F (36.3 C) 97.8 F (36.6 C)  TempSrc:    Oral  Resp: 12 18 18 18   Height:      Weight:      SpO2: 95% 98% 96% 100%    Intake/Output Summary (Last 24 hours) at 05/11/13 1551 Last data filed at 05/10/13 2100  Gross per 24 hour  Intake    560 ml  Output    350 ml  Net    210 ml   Filed Weights   05/10/13 1033  Weight: 99.02 kg (218 lb 4.8 oz)    Exam:   General:  AA Ox3  Cardiovascular: RRR  Respiratory: CTA B  Abdomen: S/NT/ND/+BS  Extremities: no C/C/E/+pedal pulses   Neurologic:  Intact and non-focal  Data Reviewed: Basic Metabolic Panel:  Recent Labs Lab 05/10/13 1041 05/10/13 1533 05/11/13 0312  NA 140  --  141  K 4.0  --  3.9  CL 101  --  101  CO2 26  --  26  GLUCOSE 109*  --  97  BUN 16  --  14  CREATININE 0.71 0.77 0.68  CALCIUM 9.4  --  8.8   Liver Function Tests:  Recent Labs Lab 05/11/13 0312  AST 19  ALT 22  ALKPHOS 89  BILITOT 0.3  PROT 7.3  ALBUMIN 3.6   No results found for this basename: LIPASE, AMYLASE,  in the last 168 hours No results found for this basename: AMMONIA,  in the last 168 hours CBC:  Recent Labs Lab 05/10/13 1041 05/10/13 1533 05/11/13 0312  WBC 5.8 6.2 5.3  HGB 14.3 13.4 12.9  HCT 41.1 38.7 37.6  MCV 87.8 88.8 88.3  PLT 280  264 258   Cardiac Enzymes:  Recent Labs Lab 05/10/13 1136 05/10/13 1533 05/10/13 2243 05/11/13 0312  TROPONINI <0.30 <0.30 <0.30 <0.30   BNP (last 3 results) No results found for this basename: PROBNP,  in the last 8760 hours CBG: No results found for this basename: GLUCAP,  in the last 168 hours  No results found for this or any previous visit (from the past 240 hour(s)).   Studies: Dg Chest 2 View  05/10/2013   CLINICAL DATA:  Pain under left breast.  EXAM: CHEST  2 VIEW  COMPARISON:  None.  FINDINGS: Cardiac silhouette is normal in size. Normal mediastinal and hilar contours. Clear lungs. No pleural effusion or pneumothorax.  Status post low anterior cervical spine fusion. Bony thorax is intact.  IMPRESSION: No active cardiopulmonary disease.   Electronically Signed   By: Lajean Manes M.D.   On: 05/10/2013 12:40    Scheduled Meds: . amLODipine  5 mg Oral Daily  . aspirin EC  325 mg Oral Daily  . cyclobenzaprine  10 mg Oral TID  . enoxaparin (LOVENOX) injection  40 mg Subcutaneous Q24H  . omega-3 acid ethyl esters  1 g Oral Daily  . pantoprazole  40 mg Oral Daily  . sodium chloride  3 mL Intravenous Q12H  . sodium chloride  3 mL Intravenous Q12H  . triamterene-hydrochlorothiazide  1 tablet Oral Daily   Continuous Infusions: . sodium chloride      Principal Problem:   Chest pain Active Problems:   Gastroesophageal reflux disease   Hypertension   Tobacco abuse    Time spent: 35 minutes. Greater than 50% of this time was spent in direct contact with the patient coordinating care.    Lelon Frohlich  [facility] Pager [phone]  If 7PM-7AM, please contact night-coverage at [email or web address], password [facility] 05/11/2013, 3:51 PM  LOS: 1 day

## 2013-05-11 NOTE — CV Procedure (Signed)
   Catheterization  Name: Brianna Foster MRN: [id number] DOB: 02-08-1956  Indication:  Chest Pain  Procedure:  After informed consent and clinical "time out" the right radial artery was prepped and draped in a sterile fashion. Allen's test was documented to be normal both by manual compression and plethosmography prior to cannulation.  A 5Fr hydrophilic sheath was advanced using a .025 nitinol wire. Catheters were advanced into the ascending aortic root with a .035 J wire and fluoroscopic guidance.  Standard JL3.5, and JR4 catheters were used to engage the coronaries.  A 5Fr 3.0 guide was also used to try to more selectively engage the LAD A standard angled pigtail catheter was used for ventriculography.  Coronary arteries were visualized in orthogonal views using caudal and cranial angulation.  RAO ventriculography was done using 28 cc of contrast.    Medications:   Versed: 3 mg's  Fentanyl: 25 ug's  Heparin: 4500 units  Verapamil: 3 mg's  Coronary Arteries:  Right dominant with no anomalies  LM: Normal   LAD: Normal  IM- Normal and large   D1- Small and normal Ostium only 20%     Circumflex:  Normal  OM1- Small and normal  OM2-  Small and normal  OM3-  Small and normal  OM4-  Small and normal  RCA: Dominant and normal   PDA-  Normal  PLB-  Normal  Ventriculography: EF: 60%,  No RWMAls   Hemodynamics:  Aortic Pressure: 137/91  mmHg  LV Pressure: 140/19  mmHg  Impression:  No significant CAD  Due to late hour will keep over night   At the end of the case a TR band was placed with good hemostasis and flow to the hand including the radial aspect of the thumb.

## 2013-05-11 NOTE — Interval H&P Note (Signed)
History and Physical Interval Note:  05/11/2013 4:23 PM  Brianna Foster  has presented today for surgery, with the diagnosis of cp  The various methods of treatment have been discussed with the patient and family. After consideration of risks, benefits and other options for treatment, the patient has consented to  Procedure(s): LEFT HEART CATHETERIZATION WITH CORONARY ANGIOGRAM (N/A) as a surgical intervention .  The patient's history has been reviewed, patient examined, no change in status, stable for surgery.  I have reviewed the patient's chart and labs.  Questions were answered to the patient's satisfaction.     Jenkins Rouge

## 2013-05-11 NOTE — ED Provider Notes (Signed)
Medical screening examination/treatment/procedure(s) were performed by non-physician practitioner and as supervising physician I was immediately available for consultation/collaboration.      Ephraim Hamburger, MD 05/11/13 1524

## 2013-05-11 NOTE — Progress Notes (Addendum)
Primary Physician: Primary Cardiologist:   HPI: Asked to see re CP Patient present to [facility] yesterday  CP began night before.Dull  Comes and goes    L sided  Associated with SOB and diaphoresis.    About 1 year ago had  SOB Not a nagging pain.  Had cardiac CT about 1 year ago. Mild nonobstructive coronary artery disease, with a noncalcified  plaque at the ostium of the D1 branch from the LAD resulting in 25-  50% stenosis (likely closer to 25%). Codominance of the coronary arteries.  Has been more fatigued over the past wk. Feels different  Hasn't been able to sleep dure to increased urination  Now 3 to 4 x per night Sweats   1 ppweek  Trying to quit.        Past Medical History  Diagnosis Date  . Hypertension   . GERD (gastroesophageal reflux disease)   . Headache(784.0)     Medications Prior to Admission  Medication Sig Dispense Refill  . amLODipine (NORVASC) 5 MG tablet Take 5 mg by mouth daily.      . Cholecalciferol (VITAMIN D PO) Take 1 tablet by mouth daily.      [name] omega-3 acid ethyl esters (LOVAZA) 1 G capsule Take 1 g by mouth daily.      [name] omeprazole (PRILOSEC) 40 MG capsule Take 1 capsule (40 mg total) by mouth daily.  90 capsule  2  . triamterene-hydrochlorothiazide (MAXZIDE-25) 37.5-25 MG per tablet Take 1 tablet by mouth daily.         [name] amLODipine  5 mg Oral Daily  . aspirin EC  325 mg Oral Daily  . cyclobenzaprine  10 mg Oral TID  . enoxaparin (LOVENOX) injection  40 mg Subcutaneous Q24H  . omega-3 acid ethyl esters  1 g Oral Daily  . pantoprazole  40 mg Oral Daily  . sodium chloride  3 mL Intravenous Q12H  . triamterene-hydrochlorothiazide  1 tablet Oral Daily    Infusions:    No Known Allergies  History   Social History  . Marital Status: Divorced    Spouse Name: N/A    Number of Children: 1  . 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 Smoking status: Current Some Day  Smoker    Types: Cigarettes    Last Attempt to Quit: 04/10/2011  . Smokeless tobacco: Never Used  . Alcohol Use: Yes     Comment: Occassional Use  . Drug Use: No  . Sexual Activity: Not Currently    Birth Control/ Protection: Post-menopausal   Other Topics Concern  . Not on file   Social History Narrative  . No narrative on file    Family History  Problem Relation Age of Onset  . Heart disease Brother     Pacemaker    REVIEW OF SYSTEMS:  All systems reviewed  Negative to the above problem except as noted above.    PHYSICAL EXAM: Filed Vitals:   05/11/13 0500  BP: 129/75  Pulse: 73  Temp: 97.4 F (36.3 C)  Resp: 18     Intake/Output Summary (Last 24 hours) at 05/11/13 0801 Last data filed at 05/10/13 2100  Gross per 24 hour  Intake    560 ml  Output    350 ml  Net    210 ml    General:  Well appearing. No respiratory difficulty HEENT:  normal Neck: supple. no JVD. Carotids 2+ bilat; no bruits. No lymphadenopathy or thryomegaly appreciated. Cor: PMI nondisplaced. Regular rate & rhythm. No rubs, gallops or murmurs. Lungs: clear Abdomen: soft, nontender, nondistended. No hepatosplenomegaly. No bruits or masses. Good bowel sounds. Extremities: no cyanosis, clubbing, rash, edema Neuro: alert & oriented x 3, cranial nerves grossly intact. moves all 4 extremities w/o difficulty. Affect pleasant.  ECG:  SR 74 bpm    Results for orders placed during the hospital encounter of 05/10/13 (from the past 24 hour(s))  CBC     Status: None   Collection Time    05/10/13 10:41 AM      Result Value Range   WBC 5.8  4.0 - 10.5 K/uL   RBC 4.68  3.87 - 5.11 MIL/uL   Hemoglobin 14.3  12.0 - 15.0 g/dL   HCT 41.1  36.0 - 46.0 %   MCV 87.8  78.0 - 100.0 fL   MCH 30.6  26.0 - 34.0 pg   MCHC 34.8  30.0 - 36.0 g/dL   RDW 13.0  11.5 - 15.5 %   Platelets 280  150 - 400 K/uL  BASIC METABOLIC PANEL     Status: Abnormal   Collection Time    05/10/13 10:41 AM      Result Value Range    Sodium 140  137 - 147 mEq/L   Potassium 4.0  3.7 - 5.3 mEq/L   Chloride 101  96 - 112 mEq/L   CO2 26  19 - 32 mEq/L   Glucose, Bld 109 (*) 70 - 99 mg/dL   BUN 16  6 - 23 mg/dL   Creatinine, Ser 0.71  0.50 - 1.10 mg/dL   Calcium 9.4  8.4 - 10.5 mg/dL   GFR calc non Af Amer >90  >90 mL/min   GFR calc Af Amer >90  >90 mL/min  POCT I-STAT TROPONIN I     Status: None   Collection Time    05/10/13 11:07 AM      Result Value Range   Troponin i, poc 0.00  0.00 - 0.08 ng/mL   Comment 3           TROPONIN I     Status: None   Collection Time    05/10/13 11:36 AM      Result Value Range   Troponin I <0.30  <0.30 ng/mL  CBC     Status: None   Collection Time    05/10/13  3:33 PM      Result Value Range   WBC 6.2  4.0 - 10.5 K/uL   RBC 4.36  3.87 - 5.11 MIL/uL   Hemoglobin 13.4  12.0 - 15.0 g/dL   HCT 38.7  36.0 - 46.0 %   MCV 88.8  78.0 - 100.0 fL   MCH 30.7  26.0 - 34.0 pg   MCHC 34.6  30.0 - 36.0 g/dL   RDW 13.0  11.5 - 15.5 %   Platelets 264  150 - 400 K/uL  CREATININE, SERUM     Status: None   Collection Time    05/10/13  3:33 PM      Result Value Range   Creatinine, Ser 0.77  0.50 - 1.10 mg/dL   GFR calc non Af Amer >90  >90 mL/min   GFR calc Af Amer >90  >90 mL/min  TROPONIN I     Status: None   Collection Time    05/10/13  3:33 PM  Result Value Range   Troponin I <0.30  <0.30 ng/mL  TROPONIN I     Status: None   Collection Time    05/10/13 10:43 PM      Result Value Range   Troponin I <0.30  <0.30 ng/mL  TROPONIN I     Status: None   Collection Time    05/11/13  3:12 AM      Result Value Range   Troponin I <0.30  <0.30 ng/mL  CBC     Status: None   Collection Time    05/11/13  3:12 AM      Result Value Range   WBC 5.3  4.0 - 10.5 K/uL   RBC 4.26  3.87 - 5.11 MIL/uL   Hemoglobin 12.9  12.0 - 15.0 g/dL   HCT 37.6  36.0 - 46.0 %   MCV 88.3  78.0 - 100.0 fL   MCH 30.3  26.0 - 34.0 pg   MCHC 34.3  30.0 - 36.0 g/dL   RDW 13.0  11.5 - 15.5 %   Platelets  258  150 - 400 K/uL  COMPREHENSIVE METABOLIC PANEL     Status: None   Collection Time    05/11/13  3:12 AM      Result Value Range   Sodium 141  137 - 147 mEq/L   Potassium 3.9  3.7 - 5.3 mEq/L   Chloride 101  96 - 112 mEq/L   CO2 26  19 - 32 mEq/L   Glucose, Bld 97  70 - 99 mg/dL   BUN 14  6 - 23 mg/dL   Creatinine, Ser 0.68  0.50 - 1.10 mg/dL   Calcium 8.8  8.4 - 10.5 mg/dL   Total Protein 7.3  6.0 - 8.3 g/dL   Albumin 3.6  3.5 - 5.2 g/dL   AST 19  0 - 37 U/L   ALT 22  0 - 35 U/L   Alkaline Phosphatase 89  39 - 117 U/L   Total Bilirubin 0.3  0.3 - 1.2 mg/dL   GFR calc non Af Amer >90  >90 mL/min   GFR calc Af Amer >90  >90 mL/min   Dg Chest 2 View  05/10/2013   CLINICAL DATA:  Pain under left breast.  EXAM: CHEST  2 VIEW  COMPARISON:  None.  FINDINGS: Cardiac silhouette is normal in size. Normal mediastinal and hilar contours. Clear lungs. No pleural effusion or pneumothorax.  Status post low anterior cervical spine fusion. Bony thorax is intact.  IMPRESSION: No active cardiopulmonary disease.   Electronically Signed   By: Lajean Manes M.D.   On: 05/10/2013 12:40     ASSESSMENT/PLAN Patient is a 58 yo with history of mild CAD  Presents with Chest pain and fatigue Concerning   Has r/o for MI With known plaquing in 2013 and change in overall symptoms I would recomm L heart cath to evaluate coronary anatomy.  Risks/benefits described  Patient understands and agrees to proceed. Keep NPO    2.  HTN  Follow  3.  Tobacco  Counselled on cessation.  Patient a 'closet smoker'  Ready to quit again  4.  HCM  Will check lipids Not on a statin.  Needs to be.

## 2013-05-11 NOTE — Progress Notes (Signed)
UR completed 

## 2013-05-12 DIAGNOSIS — R079 Chest pain, unspecified: Secondary | ICD-10-CM

## 2013-05-12 MED ORDER — ASPIRIN EC 81 MG PO TBEC: 81.0000 mg | DELAYED_RELEASE_TABLET | Freq: Every day | ORAL | Status: DC

## 2013-05-12 NOTE — Discharge Summary (Signed)
Physician Discharge Summary  Brianna Foster VOH:[id number] DOB: 09-30-1955 DOA: 05/10/2013  PCP: Donia Ast, FNP  Admit date: 05/10/2013 Discharge date: 05/12/2013  Time spent: 45 minutes  Recommendations for Outpatient Follow-up:  -Will be discharged home today. -Advised to follow up with PCP in 2 weeks. -Has appointment with cardiology scheduled as well.   Discharge Diagnoses:  Principal Problem:   Chest pain Active Problems:   Gastroesophageal reflux disease   Hypertension   Tobacco abuse   Discharge Condition: Stable and improved  Filed Weights   05/10/13 1033  Weight: 99.02 kg (218 lb 4.8 oz)    History of present illness:  58 year old female who has a past medical history of Hypertension and GERD (gastroesophageal reflux disease). Today presented to the ED with chest pain which started last night, as per patient the pain was located in the left side under her breast and did not radiate anywhere it was associated with shortness of breath and also had profuse sweating. She denies palpitations, no nausea vomiting or diarrhea. She denies fever. Patient is a smoker and smokes one pack per week, she does not have significant family history of heart disease. Patient does have a history of hypertension. Hospitalist admission was requested.   Hospital Course:   Chest Pain  -With history of mild CAD.  -ACS ruled out.  -Cardiac cath 2/2: no significant CAD. -Appreciate cards recommendations.  -CP has resolved and has been cleared to DC home today.  Tobacco Abuse  -Cessation counseling provided.    Procedures:  Cath as above   Consultations:  Cardiology  Discharge Instructions  Discharge Orders   Future Orders Complete By Expires   Diet - low sodium heart healthy  As directed    Discontinue IV  As directed    Increase activity slowly  As directed        Medication List         amLODipine 5 MG tablet  Commonly known as:  NORVASC  Take 5 mg by  mouth daily.     aspirin EC 81 MG tablet  Take 1 tablet (81 mg total) by mouth daily.     omega-3 acid ethyl esters 1 G capsule  Commonly known as:  LOVAZA  Take 1 g by mouth daily.     omeprazole 40 MG capsule  Commonly known as:  PRILOSEC  Take 1 capsule (40 mg total) by mouth daily.     triamterene-hydrochlorothiazide 37.5-25 MG per tablet  Commonly known as:  MAXZIDE-25  Take 1 tablet by mouth daily.     VITAMIN D PO  Take 1 tablet by mouth daily.       No Known Allergies     Follow-up Information   Follow up with CAMPBELL, Valier, FNP. Schedule an appointment as soon as possible for a visit in 2 weeks.   Specialty:  Family Medicine   Contact information:   [place] [address] [phone]        The results of significant diagnostics from this hospitalization (including imaging, microbiology, ancillary and laboratory) are listed below for reference.    Significant Diagnostic Studies: Dg Chest 2 View  05/10/2013   CLINICAL DATA:  Pain under left breast.  EXAM: CHEST  2 VIEW  COMPARISON:  None.  FINDINGS: Cardiac silhouette is normal in size. Normal mediastinal and hilar contours. Clear lungs. No pleural effusion or pneumothorax.  Status post low anterior cervical spine fusion. Bony thorax is intact.  IMPRESSION: No  active cardiopulmonary disease.   Electronically Signed   By: Lajean Manes M.D.   On: 05/10/2013 12:40    Microbiology: No results found for this or any previous visit (from the past 240 hour(s)).   Labs: Basic Metabolic Panel:  Recent Labs Lab 05/10/13 1041 05/10/13 1533 05/11/13 0312  NA 140  --  141  K 4.0  --  3.9  CL 101  --  101  CO2 26  --  26  GLUCOSE 109*  --  97  BUN 16  --  14  CREATININE 0.71 0.77 0.68  CALCIUM 9.4  --  8.8   Liver Function Tests:  Recent Labs Lab 05/11/13 0312  AST 19  ALT 22  ALKPHOS 89  BILITOT 0.3  PROT 7.3  ALBUMIN 3.6   No results found for this basename: LIPASE,  AMYLASE,  in the last 168 hours No results found for this basename: AMMONIA,  in the last 168 hours CBC:  Recent Labs Lab 05/10/13 1041 05/10/13 1533 05/11/13 0312  WBC 5.8 6.2 5.3  HGB 14.3 13.4 12.9  HCT 41.1 38.7 37.6  MCV 87.8 88.8 88.3  PLT 280 264 258   Cardiac Enzymes:  Recent Labs Lab 05/10/13 1136 05/10/13 1533 05/10/13 2243 05/11/13 0312  TROPONINI <0.30 <0.30 <0.30 <0.30   BNP: BNP (last 3 results) No results found for this basename: PROBNP,  in the last 8760 hours CBG: No results found for this basename: GLUCAP,  in the last 168 hours     Signed:  Lelon Frohlich  [facility] Pager: [phone] 05/12/2013, 4:09 PM

## 2013-05-12 NOTE — Progress Notes (Signed)
Subjective: No complaints  Objective: Vital signs in last 24 hours: Temp:  [97.6 F (36.4 C)-97.9 F (36.6 C)] 97.9 F (36.6 C) (02/03 0500) Pulse Rate:  [63-87] 71 (02/03 0500) Resp:  [16-18] 16 (02/03 0500) BP: (120-147)/(68-124) 135/79 mmHg (02/03 0500) SpO2:  [97 %-100 %] 98 % (02/03 0500)    Intake/Output from previous day: 02/02 0701 - 02/03 0700 In: 240 [P.O.:240] Out: -  Intake/Output this shift:    Medications Current Facility-Administered Medications  Medication Dose Route Frequency Provider Last Rate Last Dose  . 0.9 %  sodium chloride infusion  250 mL Intravenous PRN Oswald Hillock, MD      . acetaminophen (TYLENOL) tablet 650 mg  650 mg Oral Q4H PRN Josue Hector, MD      . alum & mag hydroxide-simeth (MAALOX/MYLANTA) 200-200-20 MG/5ML suspension 30 mL  30 mL Oral Q6H PRN Oswald Hillock, MD      . amLODipine (NORVASC) tablet 5 mg  5 mg Oral Daily Oswald Hillock, MD   5 mg at 05/11/13 1109  . aspirin EC tablet 325 mg  325 mg Oral Daily Oswald Hillock, MD      . cyclobenzaprine (FLEXERIL) tablet 10 mg  10 mg Oral TID Oswald Hillock, MD   10 mg at 05/11/13 2221  . diazepam (VALIUM) tablet 2 mg  2 mg Oral Q8H PRN Josue Hector, MD   2 mg at 05/11/13 2310  . enoxaparin (LOVENOX) injection 40 mg  40 mg Subcutaneous Q24H Oswald Hillock, MD   40 mg at 05/10/13 1555  . HYDROcodone-acetaminophen (NORCO/VICODIN) 5-325 MG per tablet 1-2 tablet  1-2 tablet Oral Q4H PRN Oswald Hillock, MD   2 tablet at 05/11/13 1300  . omega-3 acid ethyl esters (LOVAZA) capsule 1 g  1 g Oral Daily Oswald Hillock, MD   1 g at 05/11/13 1110  . ondansetron (ZOFRAN) tablet 4 mg  4 mg Oral Q6H PRN Oswald Hillock, MD       Or  . ondansetron (ZOFRAN) injection 4 mg  4 mg Intravenous Q6H PRN Oswald Hillock, MD      . oxyCODONE-acetaminophen (PERCOCET/ROXICET) 5-325 MG per tablet 1-2 tablet  1-2 tablet Oral Q4H PRN Josue Hector, MD   2 tablet at 05/11/13 2023  . pantoprazole (PROTONIX) EC tablet 40 mg  40 mg Oral  Daily Oswald Hillock, MD   40 mg at 05/11/13 1300  . sodium chloride 0.9 % injection 3 mL  3 mL Intravenous Q12H Oswald Hillock, MD   3 mL at 05/10/13 2148  . sodium chloride 0.9 % injection 3 mL  3 mL Intravenous PRN Oswald Hillock, MD      . triamterene-hydrochlorothiazide (MAXZIDE-25) 37.5-25 MG per tablet 1 tablet  1 tablet Oral Daily Oswald Hillock, MD   1 tablet at 05/11/13 1110    PE: General appearance: alert, cooperative and no distress Lungs: clear to auscultation bilaterally Heart: regular rate and rhythm, S1, S2 normal, no murmur, click, rub or gallop Extremities: No LEE Pulses: 2+ and symmetric Skin: Warm and dry Neurologic: Grossly normal  Lab Results:   Recent Labs  05/10/13 1041 05/10/13 1533 05/11/13 0312  WBC 5.8 6.2 5.3  HGB 14.3 13.4 12.9  HCT 41.1 38.7 37.6  PLT 280 264 258   BMET  Recent Labs  05/10/13 1041 05/10/13 1533 05/11/13 0312  NA 140  --  141  K 4.0  --  3.9  CL 101  --  101  CO2 26  --  26  GLUCOSE 109*  --  97  BUN 16  --  14  CREATININE 0.71 0.77 0.68  CALCIUM 9.4  --  8.8   PT/INR  Recent Labs  05/11/13 0930  LABPROT 12.5  INR 0.95   Cholesterol  Recent Labs  05/11/13 0930  CHOL 180   Lipid Panel     Component Value Date/Time   CHOL 180 05/11/2013 0930   TRIG 121 05/11/2013 0930   HDL 58 05/11/2013 0930   CHOLHDL 3.1 05/11/2013 0930   VLDL 24 05/11/2013 0930   LDLCALC 98 05/11/2013 0930   Cardiac Panel (last 3 results)  Recent Labs  05/10/13 1533 05/10/13 2243 05/11/13 0312  TROPONINI <0.30 <0.30 <0.30    Assessment/Plan   Principal Problem:   Chest pain Active Problems:   Gastroesophageal reflux disease   Hypertension   Tobacco abuse  Plan:  SP LHC revealing no significant CAD.  NSR on telemetry.  BP and HR stable.  DC home today.     LOS: 2 days    HAGER, BRYAN 05/12/2013 9:06 AM

## 2013-05-12 NOTE — Discharge Instructions (Signed)

## 2013-05-13 ENCOUNTER — Telehealth: Payer: Self-pay | Admitting: Family

## 2013-05-13 ENCOUNTER — Telehealth: Payer: Self-pay | Admitting: *Deleted

## 2013-05-13 ENCOUNTER — Encounter: Payer: Self-pay | Admitting: Cardiology

## 2013-05-13 NOTE — Telephone Encounter (Signed)
Pt dc'd from hospital on tues, 2/3 instructed to fu w/in a week. Pt had heart cath through wrist, which she states is painful. Pt instructed to take ibuprofen for pain. Pt would like something stronger. Made appt for mon, 8:45 am.  is it ok to work 30 min  on Friday for that pain med. Or pt wants to know if you will rx something for her? Pls advise

## 2013-05-13 NOTE — Telephone Encounter (Signed)
Pt c/o right wrist pain post heart cath from 2 days ago. Pt denies redness/ no warmth at the site/ no discoloration and no swelling. States she is tender at the wrist and it goes up her arm/ below the elbow. States her fingers are swollen but she feels that is from hand inactivity. Pain is 5/10. Informed pt she could come in and I would look at it, pt declined saying it looked fine.  Told pt to take motrin 400-600 mg up to 3 x day. Encouraged her to call back tomorrow if pain is not lessening or has wrist site changes.  Pt verbalized understanding and agreed to plan.

## 2013-05-14 NOTE — Telephone Encounter (Signed)
Patient states her right wrist is feeling better today and hurts less. She states she feels it will heal up fine at this point and will get better each day. Encouraged her to call back should she have any worsening symptoms or if the pain increases. She verbalized understanding and agreement.

## 2013-05-14 NOTE — Telephone Encounter (Signed)
Pt is feeling better today. appt sched for Friday.

## 2013-05-14 NOTE — Telephone Encounter (Signed)
Place her in a 30 min slot for friday

## 2013-05-15 ENCOUNTER — Encounter: Payer: Self-pay | Admitting: Family

## 2013-05-15 ENCOUNTER — Ambulatory Visit (INDEPENDENT_AMBULATORY_CARE_PROVIDER_SITE_OTHER): Payer: No Typology Code available for payment source | Admitting: Family

## 2013-05-15 VITALS — BP 120/90 | HR 101 | Ht 69.0 in | Wt 224.0 lb

## 2013-05-15 DIAGNOSIS — K219 Gastro-esophageal reflux disease without esophagitis: Secondary | ICD-10-CM

## 2013-05-15 DIAGNOSIS — N951 Menopausal and female climacteric states: Secondary | ICD-10-CM

## 2013-05-15 DIAGNOSIS — R079 Chest pain, unspecified: Secondary | ICD-10-CM

## 2013-05-15 MED ORDER — ESOMEPRAZOLE MAGNESIUM 40 MG PO CPDR: 40.0000 mg | DELAYED_RELEASE_CAPSULE | Freq: Every day | ORAL | Status: DC

## 2013-05-15 MED ORDER — VENLAFAXINE HCL ER 37.5 MG PO CP24: 37.5000 mg | ORAL_CAPSULE | Freq: Every day | ORAL | Status: DC

## 2013-05-15 NOTE — Progress Notes (Signed)
Subjective:    Patient ID: Brianna Foster, female    DOB: Jul 15, 1955, 58 y.o.   MRN: [id number]  HPI  58 year old AAF, former smoker, is in today as a hospital follow-up from 05/11/2013 when she presented with left chest pain that had awakened her from her sleep. She underwent a cardiac cath that was negative, CXR and cardiac enzymes were also negative. She has not had any additional chest pain. The cardiologist suggested that this could be a esophageal spasm from GERD not being well controlled recently.   Also reports menopausal symptoms not being well controlled. She stopped Effexor. Reports it was helping some but not enough.   Review of Systems  Constitutional: Negative.   HENT: Negative.   Respiratory: Negative.  Negative for chest tightness.   Cardiovascular: Negative.   Gastrointestinal: Negative.  Negative for abdominal pain and abdominal distention.  Endocrine: Negative.   Genitourinary: Negative.   Musculoskeletal: Negative.   Skin: Negative.   Allergic/Immunologic: Negative.   Neurological: Negative.   Hematological: Negative.   Psychiatric/Behavioral: Negative.    Past Medical History  Diagnosis Date  . Hypertension   . GERD (gastroesophageal reflux disease)   . Headache(784.0)     History   Social History  . Marital Status: Divorced    Spouse Name: N/A    Number of Children: 1  . 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 Smoking status: Current Some Day Smoker    Types: Cigarettes    Last Attempt to Quit: 04/10/2011  . Smokeless tobacco: Never Used  . Alcohol Use: Yes     Comment: Occassional Use  . Drug Use: No  . Sexual Activity: Not Currently    Birth Control/ Protection: Post-menopausal   Other Topics Concern  . Not on file   Social History Narrative  . No narrative on file    Past Surgical History  Procedure Laterality Date  . Neck fusion      Family History    Problem Relation Age of Onset  . Heart disease Brother     Pacemaker    No Known Allergies  Current Outpatient Prescriptions on File Prior to Visit  Medication Sig Dispense Refill  . amLODipine (NORVASC) 5 MG tablet Take 5 mg by mouth daily.      [name] aspirin EC 81 MG tablet Take 1 tablet (81 mg total) by mouth daily.      . Cholecalciferol (VITAMIN D PO) Take 1 tablet by mouth daily.      [name] omega-3 acid ethyl esters (LOVAZA) 1 G capsule Take 1 g by mouth daily.      [name] omeprazole (PRILOSEC) 40 MG capsule Take 1 capsule (40 mg total) by mouth daily.  90 capsule  2  . triamterene-hydrochlorothiazide (MAXZIDE-25) 37.5-25 MG per tablet Take 1 tablet by mouth daily.      [name] esomeprazole (NEXIUM) 40 MG capsule Take 1 capsule (40 mg total) by mouth daily.  30 capsule  3   No current facility-administered medications on file prior to visit.    BP 120/90  Pulse 101  Ht 5\' 9"  (1.753 m)  Wt 224 lb (101.606 kg)  BMI 33.06 kg/m2  LMP 10/01/2011chart    Objective:   Physical Exam  Constitutional: She is oriented to person, place, and time. She appears well-developed and well-nourished.  HENT:  Right Ear: External ear normal.  Left Ear:  External ear normal.  Nose: Nose normal.  Mouth/Throat: Oropharynx is clear and moist.  Neck: Normal range of motion. Neck supple.  Cardiovascular: Normal rate, regular rhythm and normal heart sounds.   Pulmonary/Chest: Effort normal and breath sounds normal.  Abdominal: Soft. Bowel sounds are normal.  Musculoskeletal: Normal range of motion.  Neurological: She is alert and oriented to person, place, and time.  Skin: Skin is warm and dry.  Psychiatric: She has a normal mood and affect.          Assessment & Plan:  Myiesha was seen today for follow-up.  Diagnoses and associated orders for this visit:  Chest pain, unspecified  GERD (gastroesophageal reflux disease)  Menopausal symptom  Other Orders - esomeprazole (NEXIUM) 40 MG capsule; Take 1  capsule (40 mg total) by mouth daily. - venlafaxine XR (EFFEXOR XR) 37.5 MG 24 hr capsule; Take 1 capsule (37.5 mg total) by mouth daily with breakfast.    continue current medications. Resume Effexor. Followup in 3-4 weeks for recheck on menopausal symptoms. Consider increasing the Effexor to 75 mg daily. Call the office with any questions or concerns. Complete physical in 3 months.

## 2013-05-15 NOTE — Patient Instructions (Signed)

## 2013-05-18 ENCOUNTER — Telehealth: Payer: Self-pay | Admitting: Family

## 2013-05-18 ENCOUNTER — Ambulatory Visit: Payer: BC Managed Care – PPO | Admitting: Family

## 2013-05-18 NOTE — Telephone Encounter (Signed)
Relevant patient education mailed to patient.  

## 2013-06-01 ENCOUNTER — Encounter: Payer: Self-pay | Admitting: Family

## 2013-06-01 ENCOUNTER — Other Ambulatory Visit: Payer: Self-pay | Admitting: Family

## 2013-06-01 ENCOUNTER — Telehealth: Payer: Self-pay | Admitting: Family

## 2013-06-01 ENCOUNTER — Ambulatory Visit (INDEPENDENT_AMBULATORY_CARE_PROVIDER_SITE_OTHER): Payer: No Typology Code available for payment source | Admitting: Family

## 2013-06-01 VITALS — BP 122/80 | HR 76 | Temp 97.9°F | Wt 224.0 lb

## 2013-06-01 DIAGNOSIS — R6883 Chills (without fever): Secondary | ICD-10-CM

## 2013-06-01 DIAGNOSIS — I10 Essential (primary) hypertension: Secondary | ICD-10-CM

## 2013-06-01 LAB — POCT INFLUENZA A/B
INFLUENZA A, POC: NEGATIVE
Influenza B, POC: NEGATIVE

## 2013-06-01 NOTE — Patient Instructions (Signed)
Exercise to Stay Healthy Exercise helps you become and stay healthy. EXERCISE IDEAS AND TIPS Choose exercises that:  You enjoy.  Fit into your day. You do not need to exercise really hard to be healthy. You can do exercises at a slow or medium level and stay healthy. You can:  Stretch before and after working out.  Try yoga, Pilates, or tai chi.  Lift weights.  Walk fast, swim, jog, run, climb stairs, bicycle, dance, or rollerskate.  Take aerobic classes. Exercises that burn about 150 calories:  Running 1  miles in 15 minutes.  Playing volleyball for 45 to 60 minutes.  Washing and waxing a car for 45 to 60 minutes.  Playing touch football for 45 minutes.  Walking 1  miles in 35 minutes.  Pushing a stroller 1  miles in 30 minutes.  Playing basketball for 30 minutes.  Raking leaves for 30 minutes.  Bicycling 5 miles in 30 minutes.  Walking 2 miles in 30 minutes.  Dancing for 30 minutes.  Shoveling snow for 15 minutes.  Swimming laps for 20 minutes.  Walking up stairs for 15 minutes.  Bicycling 4 miles in 15 minutes.  Gardening for 30 to 45 minutes.  Jumping rope for 15 minutes.  Washing windows or floors for 45 to 60 minutes. Document Released: 04/28/2010 Document Revised: 06/18/2011 Document Reviewed: 04/28/2010 [employer] Patient Information 2014 [employer].  

## 2013-06-01 NOTE — Progress Notes (Signed)
Pre visit review using our clinic review tool, if applicable. No additional management support is needed unless otherwise documented below in the visit note. 

## 2013-06-01 NOTE — Telephone Encounter (Signed)
Pt called back to schedule appt.

## 2013-06-01 NOTE — Telephone Encounter (Signed)
noted 

## 2013-06-01 NOTE — Telephone Encounter (Signed)
Please advise 

## 2013-06-01 NOTE — Telephone Encounter (Signed)
Pt states she is having flu like symptoms, body aches, cold chills, wants to know if something can be called in for her. Pt declined scheduling an appt.

## 2013-06-01 NOTE — Progress Notes (Signed)
Subjective:    Patient ID: Brianna Foster, female    DOB: 08-08-1955, 58 y.o.   MRN: [id number]  HPI 58 y.o. Black female who presents today with chief complain of "Flu like symptoms". Pt states she was at church yesterday and the heat was off, since last night she has developed chills, body aches, head ache and one episode of diarrhea. Denies sick contacts. Denies fever, fatigue, sore throat, runny nose, stomach pain, fatigue.    Review of Systems  Constitutional: Positive for chills.  HENT: Negative.   Eyes: Negative.   Respiratory: Positive for cough.   Cardiovascular: Negative.   Gastrointestinal: Positive for diarrhea.  Endocrine: Negative.   Genitourinary: Negative.   Musculoskeletal: Positive for myalgias.  Skin: Negative.   Allergic/Immunologic: Negative.   Neurological: Negative.   Hematological: Negative.   Psychiatric/Behavioral: Negative.    Past Medical History  Diagnosis Date  . Hypertension   . GERD (gastroesophageal reflux disease)   . Headache(784.0)     History   Social History  . Marital Status: Divorced    Spouse Name: N/A    Number of Children: 1  . 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 Smoking status: Current Some Day Smoker    Types: Cigarettes    Last Attempt to Quit: 04/10/2011  . Smokeless tobacco: Never Used  . Alcohol Use: Yes     Comment: Occassional Use  . Drug Use: No  . Sexual Activity: Not Currently    Birth Control/ Protection: Post-menopausal   Other Topics Concern  . Not on file   Social History Narrative  . No narrative on file    Past Surgical History  Procedure Laterality Date  . Neck fusion      Family History  Problem Relation Age of Onset  . Heart disease Brother     Pacemaker    No Known Allergies  Current Outpatient Prescriptions on File Prior to Visit  Medication Sig Dispense Refill  . amLODipine (NORVASC) 5 MG tablet Take 5  mg by mouth daily.      [name] aspirin EC 81 MG tablet Take 1 tablet (81 mg total) by mouth daily.      . Cholecalciferol (VITAMIN D PO) Take 1 tablet by mouth daily.      [name] esomeprazole (NEXIUM) 40 MG capsule Take 1 capsule (40 mg total) by mouth daily.  30 capsule  3  . omega-3 acid ethyl esters (LOVAZA) 1 G capsule Take 1 g by mouth daily.      [name] omeprazole (PRILOSEC) 40 MG capsule Take 1 capsule (40 mg total) by mouth daily.  90 capsule  2  . triamterene-hydrochlorothiazide (MAXZIDE-25) 37.5-25 MG per tablet Take 1 tablet by mouth daily.      [name] venlafaxine XR (EFFEXOR XR) 37.5 MG 24 hr capsule Take 1 capsule (37.5 mg total) by mouth daily with breakfast.  30 capsule  3  . esomeprazole (NEXIUM) 40 MG capsule Take 1 capsule (40 mg total) by mouth daily.  30 capsule  3   No current facility-administered medications on file prior to visit.    BP 122/80  Pulse 76  Temp(Src) 97.9 F (36.6 C) (Oral)  Wt 224 lb (101.606 kg)  LMP 10/01/2011chart    Objective:   Physical Exam  Constitutional: She is oriented to person, place, and time. She appears well-developed and well-nourished. She is active.  HENT:  Head: Normocephalic.  Right Ear: Tympanic membrane, external ear and ear canal normal.  Left Ear: Tympanic membrane, external ear and ear canal normal.  Nose: Nose normal.  Mouth/Throat: Uvula is midline and mucous membranes are normal. Posterior oropharyngeal erythema present.  Cardiovascular: Normal rate, regular rhythm, S1 normal, S2 normal and normal heart sounds.   Pulmonary/Chest: Effort normal and breath sounds normal.  Abdominal: Soft. Normal appearance and bowel sounds are normal.  Neurological: She is alert and oriented to person, place, and time.  Skin: Skin is warm, dry and intact.  Psychiatric: She has a normal mood and affect. Her speech is normal and behavior is normal.          Assessment & Plan:  58 y.o. Black female presents today with chief complain of "flu like  symptoms".  - Chills/myalgia: treat by taking tylenol 650mg  Q8 hours prn  - Education: Stay hydrated, avoid exposure to cold for prolonged periods if able.  - Follow up: If symptoms worsen.

## 2013-06-15 ENCOUNTER — Other Ambulatory Visit: Payer: Self-pay | Admitting: Family

## 2013-08-12 ENCOUNTER — Encounter: Payer: No Typology Code available for payment source | Admitting: Family

## 2013-08-19 ENCOUNTER — Encounter: Payer: Self-pay | Admitting: Family

## 2013-08-19 ENCOUNTER — Ambulatory Visit (INDEPENDENT_AMBULATORY_CARE_PROVIDER_SITE_OTHER): Payer: No Typology Code available for payment source | Admitting: Family

## 2013-08-19 VITALS — BP 120/84 | HR 90 | Temp 97.9°F

## 2013-08-19 DIAGNOSIS — S86911A Strain of unspecified muscle(s) and tendon(s) at lower leg level, right leg, initial encounter: Secondary | ICD-10-CM

## 2013-08-19 DIAGNOSIS — IMO0002 Reserved for concepts with insufficient information to code with codable children: Secondary | ICD-10-CM

## 2013-08-19 DIAGNOSIS — W010XXA Fall on same level from slipping, tripping and stumbling without subsequent striking against object, initial encounter: Secondary | ICD-10-CM

## 2013-08-19 DIAGNOSIS — S39012A Strain of muscle, fascia and tendon of lower back, initial encounter: Secondary | ICD-10-CM

## 2013-08-19 DIAGNOSIS — M79601 Pain in right arm: Secondary | ICD-10-CM

## 2013-08-19 DIAGNOSIS — M79609 Pain in unspecified limb: Secondary | ICD-10-CM

## 2013-08-19 DIAGNOSIS — S335XXA Sprain of ligaments of lumbar spine, initial encounter: Secondary | ICD-10-CM

## 2013-08-19 MED ORDER — HYDROCODONE-ACETAMINOPHEN 10-325 MG PO TABS: 1.0000 | ORAL_TABLET | Freq: Three times a day (TID) | ORAL | Status: DC | PRN

## 2013-08-19 MED ORDER — CYCLOBENZAPRINE HCL 5 MG PO TABS: 5.0000 mg | ORAL_TABLET | Freq: Three times a day (TID) | ORAL | Status: DC | PRN

## 2013-08-19 NOTE — Patient Instructions (Signed)
Sprain A sprain is an injury to the soft tissue that connects adjacent bones across a joint (ligament), in which the ligament becomes stretched or torn. The purpose of ligaments is to prevent a joint from moving out side of its intended range of motion. The most common joints of the body to suffer from a sprain are the ankles, knees, and fingers. Sprains are classified into 3 categories: grade 1, grade 2, and grade 3. Grade 1 sprains cause pain, but the tendon is not lengthened. Grade 2 sprains include a lengthened ligament due to the ligament being stretched or partially ruptured. With grade 2 sprains there is still function, although the function may be diminished. Grade 3 sprains are marked by a complete tear of the ligament and the joint usually displays a loss of function.  SYMPTOMS   Pain and tenderness in the area of injury; severity varies with extent of injury.  Swelling of the affected joint (usually).  Redness or bruising in the area of injury, either immediately or several hours after the injury.  Loss of normal mobility of the injured joint. CAUSES  A sprain may occur as a secondary injury to a traumatic event, such as a fall or twisting injury. The ankle is susceptible to sprains because of it is a mechanically weak joint and is exposed during athletic events. RISK INCREASES WITH:  Trauma, especially with high-risk activities, such as sports with a lot of jumping, for knee and ankle sprains (basketball or volleyball); sports with a lot of pivoting motions, for knee sprains (skiing, soccer, or football); and contact sports.  Falls onto outstretched hands and wrists (wrist sprains).  Catching sports, such as water polo and baseball (finger sprains).  Poorly fitting and high-heeled shoes.  Poor field conditions.  Poor strength and flexibility.  Failure to warm-up properly before activity. PREVENTION  Warm up and stretch properly before activity.  Maintain physical  fitness:  Muscle strength.  Endurance and flexibility.  Cardiovascular fitness.  Wear properly fitted and padded protective equipment.  Wrap weak joints with support bandages before strenuous activity. PROGNOSIS  If treated properly, sprains usually heal in 2 to 8 weeks. Occasionally sprains require surgery for healing to occur. RELATED COMPLICATIONS  Permanent instability of a joint if the sprain is severe or if a ligament is repeatedly sprained.  Arthritis of the joint. TREATMENT Treatment involves ice and medicine to relieve pain and inflammation. Rest and immobilization of the injured joint is necessary for healing to occur. Strengthening and stretching exercises may be recommended after immobilization to regain strength and a full range of motion. For severe sprains surgery may be necessary to repair the injured ligament. MEDICATION  If pain medicine is necessary, then nonsteroidal anti-inflammatory medicines, such as aspirin and ibuprofen, or other minor pain relievers, such as acetaminophen, are often recommended.  Do not take pain medicine for 7 days before surgery.  Prescription pain relievers may be prescribed. Use only as directed and only as much as you need.  Cortisone injections are generally not advised for sprains. Cortisone may affect the healing of the ligament. HEAT AND COLD  Cold treatment (icing) relieves pain and reduces inflammation. Cold treatment should be applied for 10 to 15 minutes every 2 to 3 hours for inflammation and pain and immediately after any activity that aggravates your symptoms. Use ice packs or massage the area with a piece of ice (ice massage).  Heat treatment may be used prior to performing the stretching and strengthening activities prescribed by your   caregiver, physical therapist, or athletic trainer. Use a heat pack or soak the injury in warm water. SEEK MEDICAL CARE IF:  Symptoms get worse or do not improve in 2 to 6 weeks despite  treatment. Document Released: 03/26/2005 Document Revised: 06/18/2011 Document Reviewed: 07/08/2008 [facility] Patient Information 2014 [place], [place].

## 2013-08-19 NOTE — Progress Notes (Addendum)
Subjective:    Patient ID: Brianna Foster, female    DOB: Oct 11, 1955, 58 y.o.   MRN: [id number]  HPI 58 year old [place] American female, nonsmoker, is in today after having a fall in the atrium of the [employer] [place] on 08/15/2013. Patient reports slipping on a wet floor and was taken to [facility] and evaluated. No wet floor signs were in sight. She had an x-ray of her entire right leg that was found to be benign. She continues to have right knee pain brief the pain a 9/10, worse with walking. Better with ambulating on a crutch. She was later diagnosed with a strain of her right knee. The following day, Sunday, she developed back pain she describes as a spasm that comes and go rate and 4/10 at its worse with movement. Has been taken ibuprofen prescribed by the emergency department and tramadol without much relief.    Reviewn of Systems  Constitutional: Negative.   Respiratory: Negative.   Cardiovascular: Negative.   Gastrointestinal: Negative.   Endocrine: Negative.   Genitourinary: Negative.   Musculoskeletal: Positive for arthralgias and back pain.       Right knee pain, low back pain, and right arm pain.   Skin: Negative.        Bruising to the right knee.   Allergic/Immunologic: Negative.   Neurological: Negative.   Psychiatric/Behavioral: Negative.    Past Medical History  Diagnosis Date  . Hypertension   . GERD (gastroesophageal reflux disease)   . Headache(784.0)     History   Social History  . Marital Status: Divorced    Spouse Name: N/A    Number of Children: 1  . 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 Smoking status: Current Some Day Smoker    Types: Cigarettes    Last Attempt to Quit: 04/10/2011  . Smokeless tobacco: Never Used  . Alcohol Use: Yes     Comment: Occassional Use  . Drug Use: No  . Sexual Activity: Not Currently    Birth Control/ Protection:  Post-menopausal   Other Topics Concern  . Not on file   Social History Narrative  . No narrative on file    Past Surgical History  Procedure Laterality Date  . Neck fusion      Family History  Problem Relation Age of Onset  . Heart disease Brother     Pacemaker    No Known Allergies  Current Outpatient Prescriptions on File Prior to Visit  Medication Sig Dispense Refill  . amLODipine (NORVASC) 5 MG tablet Take 5 mg by mouth daily.      [name] amLODipine (NORVASC) 5 MG tablet TAKE 1 TABLET (5 MG TOTAL) BY MOUTH DAILY.  90 tablet  0  . aspirin EC 81 MG tablet Take 1 tablet (81 mg total) by mouth daily.      . Cholecalciferol (VITAMIN D PO) Take 1 tablet by mouth daily.      [name] esomeprazole (NEXIUM) 40 MG capsule Take 1 capsule (40 mg total) by mouth daily.  30 capsule  3  . omega-3 acid ethyl esters (LOVAZA) 1 G capsule Take 1 g by mouth daily.      [name] omeprazole (PRILOSEC) 40 MG capsule Take 1 capsule (40 mg total) by mouth daily.  90 capsule  2  . triamterene-hydrochlorothiazide (MAXZIDE-25) 37.5-25 MG per tablet Take 1 tablet by mouth daily.      [name]  triamterene-hydrochlorothiazide (MAXZIDE-25) 37.5-25 MG per tablet TAKE 1 TABLET BY MOUTH EVERY DAY  90 tablet  2  . venlafaxine XR (EFFEXOR XR) 37.5 MG 24 hr capsule Take 1 capsule (37.5 mg total) by mouth daily with breakfast.  30 capsule  3  . esomeprazole (NEXIUM) 40 MG capsule Take 1 capsule (40 mg total) by mouth daily.  30 capsule  3   No current facility-administered medications on file prior to visit.    BP 120/84  Pulse 90  Temp(Src) 97.9 F (36.6 C) (Oral)  LMP 10/01/2011chart    Objective:   Physical Exam  Constitutional: She is oriented to person, place, and time. She appears well-developed and well-nourished.  HENT:  Right Ear: External ear normal.  Left Ear: External ear normal.  Nose: Nose normal.  Mouth/Throat: Oropharynx is clear and moist.  Neck: Normal range of motion. Neck supple.  Cardiovascular: Normal  rate, regular rhythm and normal heart sounds.   Pulmonary/Chest: Effort normal. She has rales.  Musculoskeletal: She exhibits tenderness.  Decreased range of motion of the lumbar spine due to pain. Tenderness to palpation left of the spinosis process. Has about a 40 hip flexion. Negative straight leg raise.  Moderate ecchymosis noted to the medial aspect of the right knee. Decreased range of motion due to pain. Mild effusion. Pedal pulses 2 out of 2.  Tenderness to palpation of the right proximal humerus. No pinpoint tenderness. Full range of motion with minimal discomfort. Radial pulses 2 out of 2. No ecchymosis.  Neurological: She is alert and oriented to person, place, and time.  Skin: Skin is warm and dry.  ecchymosis noted to the right knee  Psychiatric: She has a normal mood and affect.          Assessment & Plan:  Kariana was seen today for follow-up.  Diagnoses and associated orders for this visit:  Fall due to wet surface  Low back strain  Strain of right knee  Right arm pain  Other Orders - cyclobenzaprine (FLEXERIL) 5 MG tablet; Take 1 tablet (5 mg total) by mouth 3 (three) times daily as needed for muscle spasms. - HYDROcodone-acetaminophen (NORCO) 10-325 MG per tablet; Take 1 tablet by mouth every 8 (eight) hours as needed.   Consider MRI of the right knee if no better. Ice to the right knee twice daily. Recheck in 2 weeks. Warned medications can cause drowsiness.

## 2013-08-19 NOTE — Progress Notes (Signed)
Pre visit review using our clinic review tool, if applicable. No additional management support is needed unless otherwise documented below in the visit note. 

## 2013-09-02 ENCOUNTER — Ambulatory Visit (INDEPENDENT_AMBULATORY_CARE_PROVIDER_SITE_OTHER)
Admission: RE | Admit: 2013-09-02 | Discharge: 2013-09-02 | Disposition: A | Discharge status: Home / Self Care | Payer: No Typology Code available for payment source | Source: Ambulatory Visit | Attending: Family | Admitting: Family

## 2013-09-02 ENCOUNTER — Ambulatory Visit (INDEPENDENT_AMBULATORY_CARE_PROVIDER_SITE_OTHER): Payer: No Typology Code available for payment source | Admitting: Family

## 2013-09-02 ENCOUNTER — Encounter: Payer: Self-pay | Admitting: Family

## 2013-09-02 VITALS — BP 122/76 | HR 120 | Temp 98.5°F | Wt 222.0 lb

## 2013-09-02 DIAGNOSIS — M25569 Pain in unspecified knee: Secondary | ICD-10-CM

## 2013-09-02 DIAGNOSIS — M545 Low back pain, unspecified: Secondary | ICD-10-CM

## 2013-09-02 DIAGNOSIS — W010XXA Fall on same level from slipping, tripping and stumbling without subsequent striking against object, initial encounter: Secondary | ICD-10-CM

## 2013-09-02 DIAGNOSIS — S99919A Unspecified injury of unspecified ankle, initial encounter: Secondary | ICD-10-CM

## 2013-09-02 DIAGNOSIS — S8990XA Unspecified injury of unspecified lower leg, initial encounter: Secondary | ICD-10-CM

## 2013-09-02 DIAGNOSIS — S99929A Unspecified injury of unspecified foot, initial encounter: Secondary | ICD-10-CM

## 2013-09-02 DIAGNOSIS — S8991XA Unspecified injury of right lower leg, initial encounter: Secondary | ICD-10-CM

## 2013-09-02 DIAGNOSIS — M25562 Pain in left knee: Secondary | ICD-10-CM

## 2013-09-02 MED ORDER — AMLODIPINE BESYLATE 5 MG PO TABS: 5.0000 mg | ORAL_TABLET | Freq: Every day | ORAL | Status: DC

## 2013-09-02 MED ORDER — IBUPROFEN 600 MG PO TABS: 600.0000 mg | ORAL_TABLET | Freq: Three times a day (TID) | ORAL | Status: DC

## 2013-09-02 MED ORDER — HYDROCODONE-ACETAMINOPHEN 10-325 MG PO TABS: 1.0000 | ORAL_TABLET | Freq: Three times a day (TID) | ORAL | Status: DC | PRN

## 2013-09-02 NOTE — Patient Instructions (Signed)
Meniscus Tear with Phase I Rehab The meniscus is a C-shaped cartilage structure, located in the knee joint between the thigh bone (femur) and the shinbone (tibia). Two menisci are located in each knee joint: the inner and outer meniscus. The meniscus acts as an adapter between the thigh bone and shinbone, allowing them to fit properly together. It also functions as a shock absorber, to reduce the stress placed on the knee joint and to help supply nutrients to the knee joint cartilage. As people age, the meniscus begins to harden and become more vulnerable to injury. Meniscus tears are a common injury, especially in older athletes. Inner meniscus tears are more common than outer meniscus tears.  SYMPTOMS   Pain in the knee, especially with standing or squatting with the affected leg.  Tenderness along the joint line.  Swelling in the knee joint (effusion), usually starting 1 to 2 days after injury.  Locking or catching of the knee joint, causing inability to straighten the knee completely.  Giving way or buckling of the knee. CAUSES  A meniscus tear occurs when a force is placed on the meniscus that is greater than it can handle. Common causes of injury include:  Direct hit (trauma) to the knee.  Twisting, pivoting, or cutting (rapidly changing direction while running), kneeling or squatting.  Without injury, due to aging. RISK INCREASES WITH:  Contact sports (football, rugby).  Sports in which cleats are used with pivoting (soccer, lacrosse) or sports in which good shoe grip and sudden change in direction are required (racquetball, basketball, squash).  Previous knee injury.  Associated knee injury, particularly ligament injuries.  Poor strength and flexibility. PREVENTION  Warm up and stretch properly before activity.  Maintain physical fitness:  Strength, flexibility, and endurance.  Cardiovascular fitness.  Protect the knee with a brace or elastic bandage.  Wear  properly fitted protective equipment (proper cleats for the surface). PROGNOSIS  Sometimes, meniscus tears heal on their own. However, definitive treatment requires surgery, followed by at least 6 weeks of recovery.  RELATED COMPLICATIONS   Recurring symptoms that result in a chronic problem.  Repeated knee injury, especially if sports are resumed too soon after injury or surgery.  Progression of the tear (the tear gets larger), if untreated.  Arthritis of the knee in later years (with or without surgery).  Complications of surgery, including infection, bleeding, injury to nerves (numbness, weakness, paralysis) continued pain, giving way, locking, nonhealing of meniscus (if repaired), need for further surgery, and knee stiffness (loss of motion). TREATMENT  Treatment first involves the use of ice and medicine, to reduce pain and inflammation. You may find using crutches to walk more comfortable. However, it is okay to bear weight on the injured knee, if the pain will allow it. Surgery is often advised as a definitive treatment. Surgery is performed through an incision near the joint (arthroscopically). The torn piece of the meniscus is removed, and if possible the joint cartilage is repaired. After surgery, the joint must be restrained. After restraint, it is important to perform strengthening and stretching exercises to help regain strength and a full range of motion. These exercises may be completed at home or with a therapist.  MEDICATION  If pain medicine is needed, nonsteroidal anti-inflammatory medicines (aspirin and ibuprofen), or other minor pain relievers (acetaminophen), are often advised.  Do not take pain medicine for 7 days before surgery.  Prescription pain relievers may be given, if your caregiver thinks they are needed. Use only as directed and   only as much as you need. HEAT AND COLD  Cold treatment (icing) should be applied for 10 to 15 minutes every 2 to 3 hours for  inflammation and pain, and immediately after activity that aggravates your symptoms. Use ice packs or an ice massage.  Heat treatment may be used before performing stretching and strengthening activities prescribed by your caregiver, physical therapist, or athletic trainer. Use a heat pack or a warm water soak. SEEK MEDICAL CARE IF:   Symptoms get worse or do not improve in 2 weeks, despite treatment.  New, unexplained symptoms develop. (Drugs used in treatment may produce side effects.) EXERCISES RANGE OF MOTION (ROM) AND STRETCHING EXERCISES - Meniscus Tear, Non-operative, Phase I These are some of the initial exercises with which you may start your rehabilitation program, until you see your caregiver again or until your symptoms are resolved. Remember:   These initial exercises are intended to be gentle. They will help you restore motion without increasing any swelling.  Completing these exercises allows less painful movement and prepares you for the more aggressive strengthening exercises in Phase II.  An effective stretch should be held for at least 30 seconds.  A stretch should never be painful. You should only feel a gentle lengthening or release in the stretched tissue. RANGE OF MOTION - Knee Flexion, Active  Lie on your back with both knees straight. (If this causes back discomfort, bend your healthy knee, placing your foot flat on the floor.)  Slowly slide your heel back toward your buttocks until you feel a gentle stretch in the front of your knee or thigh.  Hold for __________ seconds. Slowly slide your heel back to the starting position. Repeat __________ times. Complete this exercise __________ times per day.  RANGE OF MOTION - Knee Flexion and Extension, Active-Assisted  Sit on the edge of a table or chair with your thighs firmly supported. It may be helpful to place a folded towel under the end of your right / left thigh.  Flexion (bending): Place the ankle of your  healthy leg on top of the other ankle. Use your healthy leg to gently bend your right / left knee until you feel a mild tension across the top of your knee.  Hold for __________ seconds.  Extension (straightening): Switch your ankles so your right / left leg is on top. Use your healthy leg to straighten your right / left knee until you feel a mild tension on the backside of your knee.  Hold for __________ seconds. Repeat __________ times. Complete __________ times per day. STRETCH - Knee Flexion, Supine  Lie on the floor with your right / left heel and foot lightly touching the wall. (Place both feet on the wall if you do not use a door frame.)  Without using any effort, allow gravity to slide your foot down the wall slowly until you feel a gentle stretch in the front of your right / left knee.  Hold this stretch for __________ seconds. Then return the leg to the starting position, using your healthy leg for help, if needed. Repeat __________ times. Complete this stretch __________ times per day.  STRETCH - Knee Extension Sitting  Sit with your right / left leg/heel propped on another chair, coffee table, or foot stool.  Allow your leg muscles to relax, letting gravity straighten out your knee.*  You should feel a stretch behind your right / left knee. Hold this position for __________ seconds. Repeat __________ times. Complete this stretch __________   times per day.  *Your physician, physical therapist or athletic trainer may instruct you place a __________ weight on your thigh, just above your kneecap, to deepen the stretch.  STRENGTHENING EXERCISES - Meniscus Tear, Non-operative, Phase I These exercises may help you when beginning to rehabilitate your injury. They may resolve your symptoms with or without further involvement from your physician, physical therapist or athletic trainer. While completing these exercises, remember:   Muscles can gain both the endurance and the strength  needed for everyday activities through controlled exercises.  Complete these exercises as instructed by your physician, physical therapist or athletic trainer. Progress the resistance and repetitions only as guided. STRENGTH - Quadriceps, Isometrics  Lie on your back with your right / left leg extended and your opposite knee bent.  Gradually tense the muscles in the front of your right / left thigh. You should see either your knee cap slide up toward your hip or increased dimpling just above the knee. This motion will push the back of the knee down toward the floor, mat, or bed on which you are lying.  Hold the muscle as tight as you can, without increasing your pain, for __________ seconds.  Relax the muscles slowly and completely between each repetition. Repeat __________ times. Complete this exercise __________ times per day.  STRENGTH - Quadriceps, Short Arcs   Lie on your back. Place a __________ inch towel roll under your right / left knee, so that the knee bends slightly.  Raise only your lower leg by tightening the muscles in the front of your thigh. Do not allow your thigh to rise.  Hold this position for __________ seconds. Repeat __________ times. Complete this exercise __________ times per day.  OPTIONAL ANKLE WEIGHTS: Begin with ____________________, but DO NOT exceed ____________________. Increase in 1 pound/0.5 kilogram increments. STRENGTH - Quadriceps, Straight Leg Raises  Quality counts! Watch for signs that the quadriceps muscle is working, to be sure you are strengthening the correct muscles and not "cheating" by substituting with healthier muscles.  Lay on your back with your right / left leg extended and your opposite knee bent.  Tense the muscles in the front of your right / left thigh. You should see either your knee cap slide up or increased dimpling just above the knee. Your thigh may even shake a bit.  Tighten these muscles even more and raise your leg 4 to 6  inches off the floor. Hold for __________ seconds.  Keeping these muscles tense, lower your leg.  Relax the muscles slowly and completely in between each repetition. Repeat __________ times. Complete this exercise __________ times per day.  STRENGTH - Hamstring, Curls   Lay on your stomach with your legs extended. (If you lay on a bed, your feet may hang over the edge.)  Tighten the muscles in the back of your thigh to bend your right / left knee up to 90 degrees. Keep your hips flat on the bed.  Hold this position for __________ seconds.  Slowly lower your leg back to the starting position. Repeat __________ times. Complete this exercise __________ times per day.  STRENGTH  Quadriceps, Squats  Stand in a door frame so that your feet and knees are in line with the frame.  Use your hands for balance, not support, on the frame.  Slowly lower your weight, bending at the hips and knees. Keep your lower legs upright so that they are parallel with the door frame. Squat only within the range that does   not increase your knee pain. Never let your hips drop below your knees.  Slowly return upright, pushing with your legs, not pulling with your hands. Repeat __________ times. Complete this exercise __________ times per day.  STRENGTH - Quad/VMO, Isometric   Sit in a chair with your right / left knee slightly bent. With your fingertips, feel the VMO muscle just above the inside of your knee. The VMO is important in controlling the position of your kneecap.  Keeping your fingertips on this muscle. Without actually moving your leg, attempt to drive your knee down as if straightening your leg. You should feel your VMO tense. If you have a difficult time, you may wish to try the same exercise on your healthy knee first.  Tense this muscle as hard as you can without increasing any knee pain.  Hold for __________ seconds. Relax the muscles slowly and completely in between each repetition. Repeat  __________ times. Complete exercise __________ times per day.  Document Released: 04/09/1998 Document Revised: 06/18/2011 Document Reviewed: 07/08/2008 [employer] Patient Information 2014 [employer].    

## 2013-09-02 NOTE — Progress Notes (Signed)
   Subjective:    Patient ID: Brianna Foster, female    DOB: [birth year], 58 y.o.   MRN: [id number]  HPI 58 year old [place] American female comes in for a followup of right knee pain and low back pain after a fall and a hotel in [place]. She was seen in the emergency department and was x-rayed in x-rays were normal. She continues to have pain in her right knee. Swelling has improved. Recent pain is 6/10, worse with movement. Has been taking hydrocodone, applying ice, taken ibuprofen. Back pain has resolved with taken Flexeril. Reports that Sunday, May 24, she noticed a tender, firm mass in her left knee that has since been painful. The pain is 7/10. Worse when applying pressure.   Review of Systems  Constitutional: Negative.   Respiratory: Negative.   Cardiovascular: Negative.   Endocrine: Negative.   Genitourinary: Negative.   Musculoskeletal: Positive for arthralgias.       Right knee pain. Left knee knot and pain.     Skin: Negative.   Neurological: Negative.   Psychiatric/Behavioral: Negative.        Objective:   Physical Exam  Constitutional: She is oriented to person, place, and time. She appears well-developed and well-nourished.  Neck: Normal range of motion. Neck supple.  Cardiovascular: Normal rate, regular rhythm and normal heart sounds.   Pulmonary/Chest: Effort normal and breath sounds normal.  Musculoskeletal: She exhibits tenderness.       Left knee: She exhibits bony tenderness. Tenderness found. Medial joint line tenderness noted.  Right knee: tenderness to palpation medially. Minimal swelling. No effusion. Pain with flexion and extension.  Left knee: firm palpable non mobile, tender mass noted.   Neurological: She is alert and oriented to person, place, and time.  Skin: Skin is warm and dry.  Psychiatric: She has a normal mood and affect.          Assessment & Plan:  Jordana was seen today for follow-up.  Diagnoses and associated orders for this  visit:  Fall due to wet surface - MR Knee Right Wo Contrast; Future - DG Knee Complete 4 Views Left; Future  Right knee injury - MR Knee Right Wo Contrast; Future  Left knee pain - DG Knee Complete 4 Views Left; Future  Low back pain  Other Orders - amLODipine (NORVASC) 5 MG tablet; Take 1 tablet (5 mg total) by mouth daily. - HYDROcodone-acetaminophen (NORCO) 10-325 MG per tablet; Take 1 tablet by mouth every 8 (eight) hours as needed. - ibuprofen (ADVIL,MOTRIN) 600 MG tablet; Take 1 tablet (600 mg total) by mouth 3 (three) times daily.   Recheck pending MRI, Xray. Wean pain medication. Renew Ibuprofen. Continue Ice.

## 2013-09-03 ENCOUNTER — Other Ambulatory Visit: Payer: Self-pay

## 2013-09-03 DIAGNOSIS — M779 Enthesopathy, unspecified: Secondary | ICD-10-CM

## 2013-09-11 ENCOUNTER — Encounter: Payer: No Typology Code available for payment source | Admitting: Family

## 2013-09-11 DIAGNOSIS — Z0289 Encounter for other administrative examinations: Secondary | ICD-10-CM

## 2013-10-27 ENCOUNTER — Ambulatory Visit: Payer: No Typology Code available for payment source | Admitting: Family

## 2013-10-28 ENCOUNTER — Encounter: Payer: Self-pay | Admitting: Family

## 2013-10-28 ENCOUNTER — Ambulatory Visit (INDEPENDENT_AMBULATORY_CARE_PROVIDER_SITE_OTHER): Payer: BC Managed Care – PPO | Admitting: Family

## 2013-10-28 VITALS — BP 120/80 | HR 96 | Wt 225.0 lb

## 2013-10-28 DIAGNOSIS — M25571 Pain in right ankle and joints of right foot: Secondary | ICD-10-CM

## 2013-10-28 DIAGNOSIS — M25579 Pain in unspecified ankle and joints of unspecified foot: Secondary | ICD-10-CM

## 2013-10-28 MED ORDER — TRIAMTERENE-HCTZ 37.5-25 MG PO TABS: ORAL_TABLET | ORAL | Status: DC

## 2013-10-28 NOTE — Patient Instructions (Signed)

## 2013-10-29 NOTE — Progress Notes (Signed)
Subjective:    Patient ID: Brianna Foster, female    DOB: 1955/08/17, 58 y.o.   MRN: [id number]  Ankle Pain    58 year old [place] American female, nonsmoker is in today with complaints of right ankle pain related to a fall that occurred in May and in diffusely. She initially had pain in the right ankle and was x-rayed at the emergency department and found to be normal and classified as a sprain. Over the past week, the pain had returned without any acute injury. Reports pain with standing. Rate pain is 7/10, worse with walking and better with sitting. Has not been taking medication for relief.   Review of Systems  Constitutional: Negative.   Respiratory: Negative.   Cardiovascular: Negative.   Musculoskeletal: Positive for arthralgias.       Right ankle pain  Skin: Negative.   Psychiatric/Behavioral: Negative.    Past Medical History  Diagnosis Date  . Hypertension   . GERD (gastroesophageal reflux disease)   . Headache(784.0)     History   Social History  . Marital Status: Divorced    Spouse Name: N/A    Number of Children: 1  . 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 Smoking status: Current Some Day Smoker    Types: Cigarettes    Last Attempt to Quit: 04/10/2011  . Smokeless tobacco: Never Used  . Alcohol Use: Yes     Comment: Occassional Use  . Drug Use: No  . Sexual Activity: Not Currently    Birth Control/ Protection: Post-menopausal   Other Topics Concern  . Not on file   Social History Narrative  . No narrative on file    Past Surgical History  Procedure Laterality Date  . Neck fusion      Family History  Problem Relation Age of Onset  . Heart disease Brother     Pacemaker    No Known Allergies  Current Outpatient Prescriptions on File Prior to Visit  Medication Sig Dispense Refill  . amLODipine (NORVASC) 5 MG tablet Take 1 tablet (5 mg total) by mouth daily.  90  tablet  0  . aspirin EC 81 MG tablet Take 1 tablet (81 mg total) by mouth daily.      . Cholecalciferol (VITAMIN D PO) Take 1 tablet by mouth daily.      [name] esomeprazole (NEXIUM) 40 MG capsule Take 1 capsule (40 mg total) by mouth daily.  30 capsule  3  . ibuprofen (ADVIL,MOTRIN) 600 MG tablet Take 1 tablet (600 mg total) by mouth 3 (three) times daily.  30 tablet  1  . omeprazole (PRILOSEC) 40 MG capsule Take 1 capsule (40 mg total) by mouth daily.  90 capsule  2  . venlafaxine XR (EFFEXOR XR) 37.5 MG 24 hr capsule Take 1 capsule (37.5 mg total) by mouth daily with breakfast.  30 capsule  3  . esomeprazole (NEXIUM) 40 MG capsule Take 1 capsule (40 mg total) by mouth daily.  30 capsule  3  . traMADol (ULTRAM) 50 MG tablet        No current facility-administered medications on file prior to visit.    BP 120/80  Pulse 96  Wt 225 lb (102.059 kg)  LMP 10/01/2011chart    Objective:   Physical Exam  Constitutional: She is oriented to person, place, and time. She appears well-developed and well-nourished.  Neck: Normal range of  motion. Neck supple.  Cardiovascular: Normal rate, regular rhythm and normal heart sounds.   Pulmonary/Chest: Effort normal and breath sounds normal.  Musculoskeletal: She exhibits tenderness. She exhibits no edema.  Right ankle: no pain with flexion or extension. Pain with inversion. No pain with eversion. Pedal pulse 2/2  Neurological: She is alert and oriented to person, place, and time.  Skin: Skin is warm and dry.  Psychiatric: She has a normal mood and affect.          Assessment & Plan:  Brianna Foster was seen today for ankle pain.  Diagnoses and associated orders for this visit:  Pain in joint, ankle and foot, right - Ambulatory referral to Orthopedic Surgery  Other Orders - triamterene-hydrochlorothiazide (MAXZIDE-25) 37.5-25 MG per tablet; TAKE 1 TABLET BY MOUTH EVERY DAY   Call the office with any questions or concerns. Recheck as needed and as  scheduled.

## 2013-11-10 ENCOUNTER — Ambulatory Visit: Payer: BC Managed Care – PPO | Admitting: Family

## 2013-11-10 DIAGNOSIS — Z0289 Encounter for other administrative examinations: Secondary | ICD-10-CM

## 2013-11-23 ENCOUNTER — Telehealth: Payer: Self-pay | Admitting: Family

## 2013-11-23 NOTE — Telephone Encounter (Signed)
Pt is aware of the [place] order. Pt then stated she's been having issues with breathing when she wakes up and would like to know if she can have chest x-ray??

## 2013-11-23 NOTE — Telephone Encounter (Signed)
Spoke with pt and advised that [place] cannot be ordered. She was seen at an outside UC, therefore we cannot see the OV note. Pt states that she feels better than she did over the weekend. She was given a medication to drink and says it has made her feel some better. She is also concerned about her breathing in the mornings. Advised her if her abdominal pain and breathing become unbearable or if other sxs occur, she should go to an UC or ER. Pt verbalized understanding and will wait until appt next week for evaluation by [facility]

## 2013-12-02 ENCOUNTER — Other Ambulatory Visit: Payer: Self-pay | Admitting: Family

## 2013-12-02 ENCOUNTER — Ambulatory Visit: Payer: BC Managed Care – PPO | Admitting: Family

## 2013-12-02 DIAGNOSIS — Z0289 Encounter for other administrative examinations: Secondary | ICD-10-CM

## 2014-02-02 ENCOUNTER — Other Ambulatory Visit: Payer: Self-pay

## 2014-02-02 ENCOUNTER — Other Ambulatory Visit: Payer: Self-pay | Admitting: Family

## 2014-02-02 DIAGNOSIS — Z1231 Encounter for screening mammogram for malignant neoplasm of breast: Secondary | ICD-10-CM

## 2014-02-09 ENCOUNTER — Ambulatory Visit: Payer: BC Managed Care – PPO | Admitting: Family

## 2014-02-12 ENCOUNTER — Ambulatory Visit: Payer: BC Managed Care – PPO | Admitting: Family

## 2014-02-18 ENCOUNTER — Ambulatory Visit: Payer: BC Managed Care – PPO

## 2014-02-23 ENCOUNTER — Ambulatory Visit: Payer: BC Managed Care – PPO

## 2014-03-07 ENCOUNTER — Other Ambulatory Visit: Payer: Self-pay | Admitting: Family

## 2014-03-08 ENCOUNTER — Ambulatory Visit: Payer: BC Managed Care – PPO

## 2014-03-15 ENCOUNTER — Ambulatory Visit: Payer: BC Managed Care – PPO

## 2014-03-18 ENCOUNTER — Encounter (HOSPITAL_COMMUNITY): Payer: Self-pay | Admitting: Cardiovascular Disease

## 2014-03-29 ENCOUNTER — Encounter: Payer: BC Managed Care – PPO | Admitting: Family

## 2014-04-06 ENCOUNTER — Ambulatory Visit: Payer: BC Managed Care – PPO

## 2014-04-17 IMAGING — CR DG WRIST COMPLETE 3+V*L*
2 series · 2 of 2 positions shown · non-contrast
Comparison: None.

CLINICAL DATA: Pain

LEFT WRIST - COMPLETE 3+ VIEW

[view not recorded (1 of 2)]
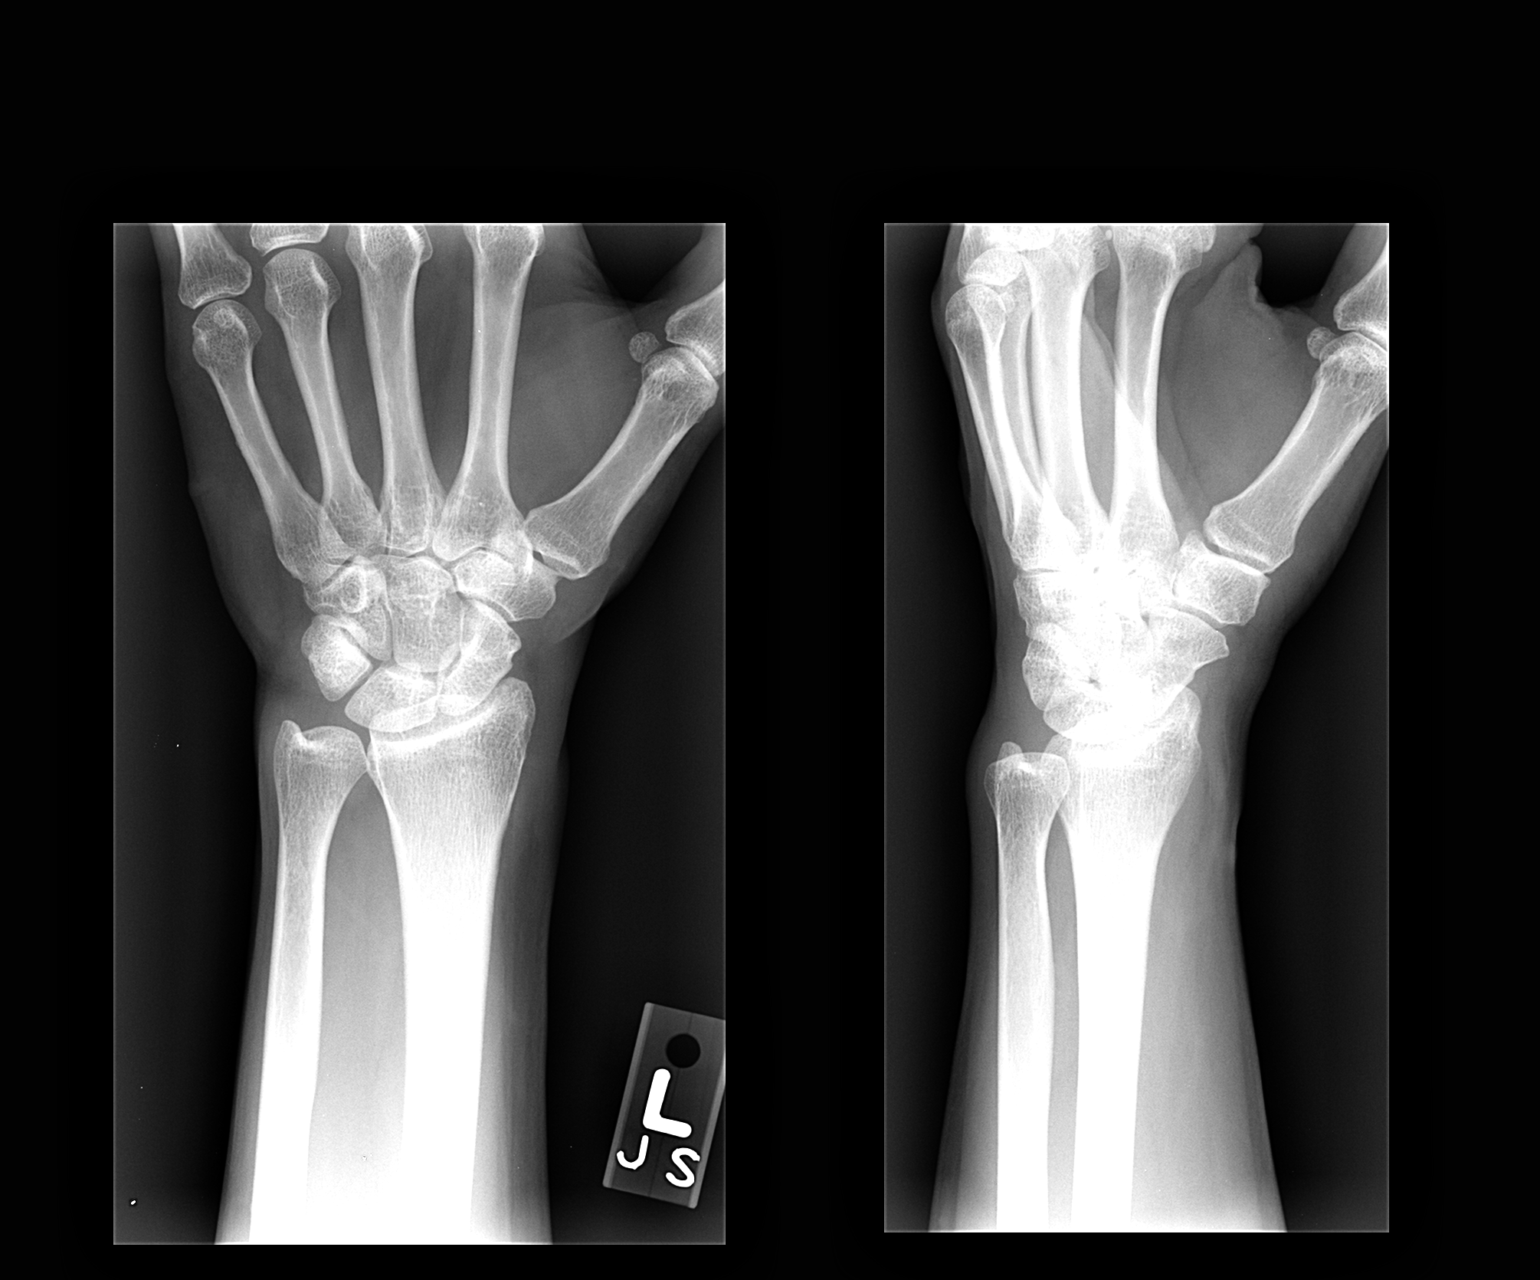

[view not recorded (2 of 2)]
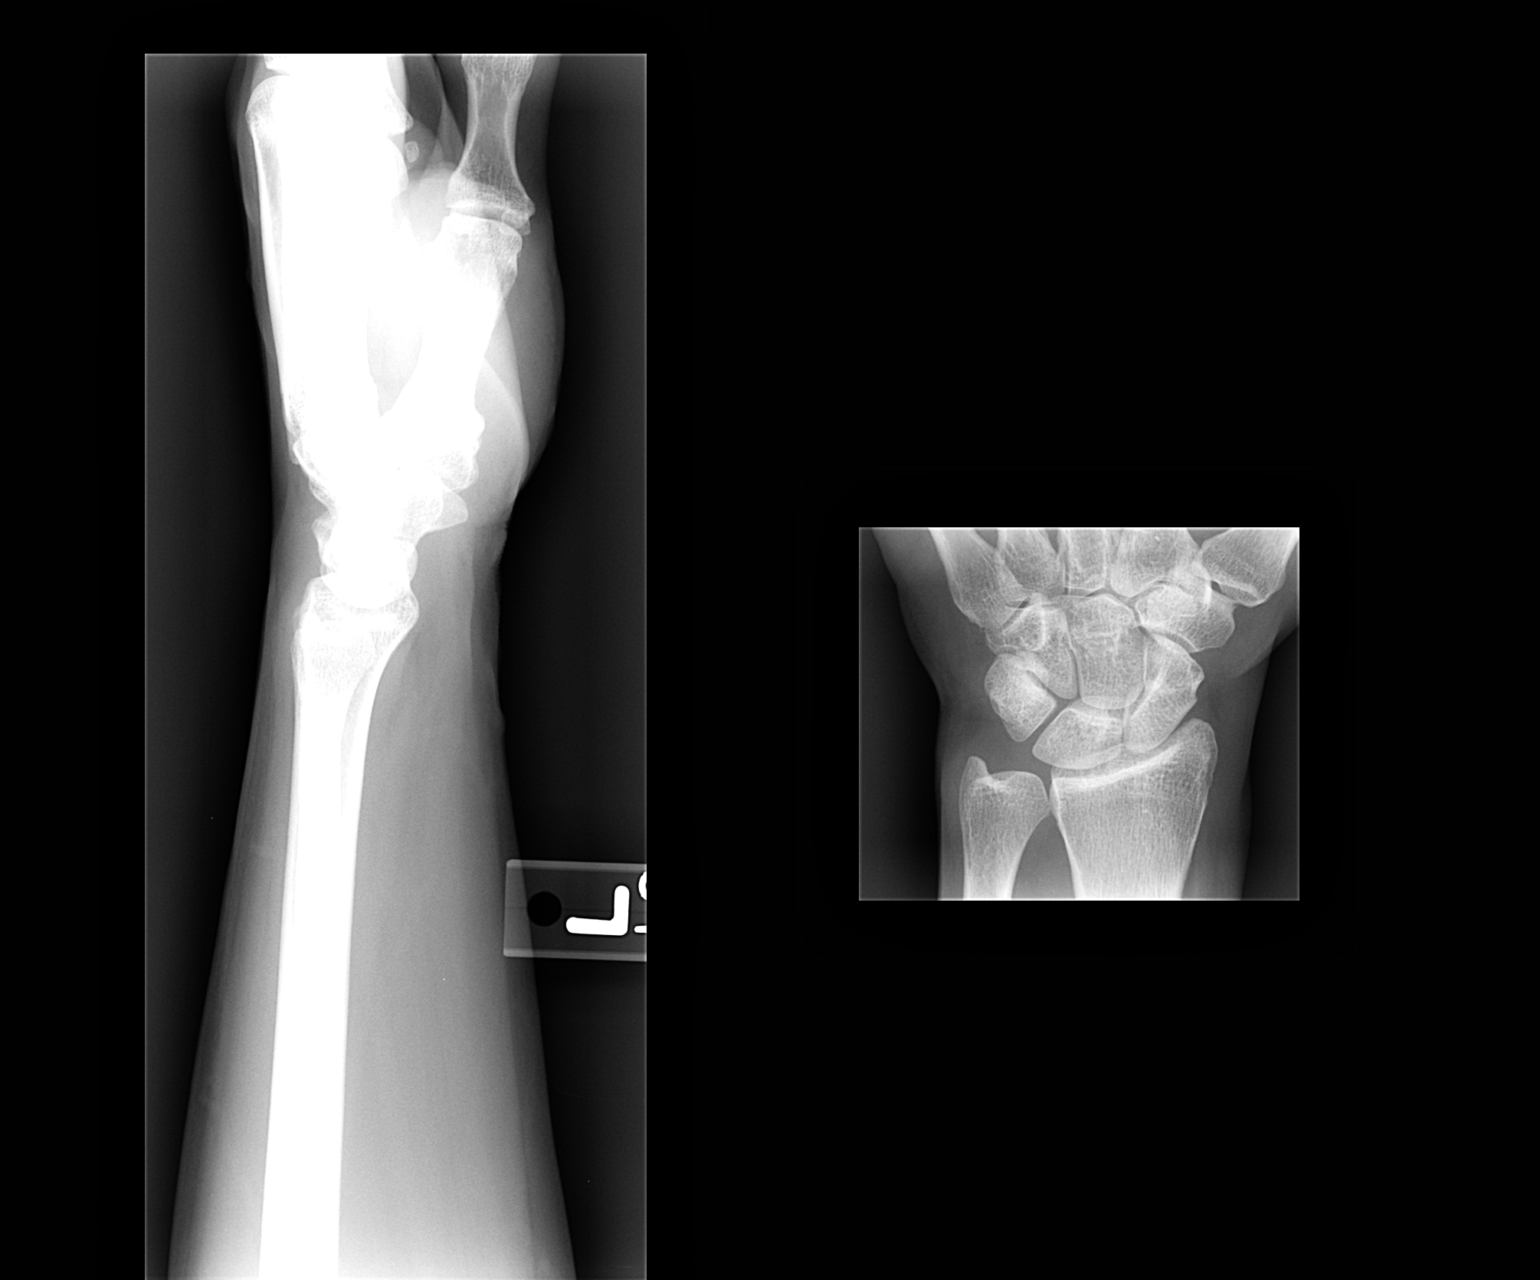

[2 of 2 positions shown; findings below may reference images not displayed]

FINDINGS: Four views of the left wrist submitted.  No acute
fracture or subluxation.  No radiopaque foreign body.
IMPRESSION: No acute fracture or subluxation.

## 2014-05-14 ENCOUNTER — Encounter: Payer: BC Managed Care – PPO | Admitting: Family

## 2014-06-28 ENCOUNTER — Other Ambulatory Visit: Payer: Self-pay | Admitting: Family

## 2014-07-13 ENCOUNTER — Other Ambulatory Visit: Payer: Self-pay | Admitting: Family

## 2014-10-22 ENCOUNTER — Ambulatory Visit
Admission: RE | Admit: 2014-10-22 | Discharge: 2014-10-22 | Disposition: A | Discharge status: Home / Self Care | Payer: No Typology Code available for payment source | Source: Ambulatory Visit

## 2014-10-22 DIAGNOSIS — Z1231 Encounter for screening mammogram for malignant neoplasm of breast: Secondary | ICD-10-CM

## 2014-10-26 ENCOUNTER — Other Ambulatory Visit: Payer: Self-pay | Admitting: Family

## 2014-10-26 DIAGNOSIS — R928 Other abnormal and inconclusive findings on diagnostic imaging of breast: Secondary | ICD-10-CM

## 2014-11-02 ENCOUNTER — Other Ambulatory Visit: Payer: Self-pay | Admitting: Family

## 2014-11-03 ENCOUNTER — Other Ambulatory Visit: Payer: Self-pay

## 2014-11-03 ENCOUNTER — Emergency Department: Payer: No Typology Code available for payment source

## 2014-11-03 ENCOUNTER — Emergency Department
Admission: EM | Admit: 2014-11-03 | Discharge: 2014-11-03 | Disposition: A | Discharge status: Home / Self Care | Payer: No Typology Code available for payment source | Attending: Student | Admitting: Student

## 2014-11-03 ENCOUNTER — Encounter: Payer: Self-pay | Admitting: *Deleted

## 2014-11-03 ENCOUNTER — Ambulatory Visit
Admission: RE | Admit: 2014-11-03 | Discharge: 2014-11-03 | Disposition: A | Discharge status: Home / Self Care | Payer: No Typology Code available for payment source | Source: Ambulatory Visit | Attending: Family | Admitting: Family

## 2014-11-03 DIAGNOSIS — R928 Other abnormal and inconclusive findings on diagnostic imaging of breast: Secondary | ICD-10-CM

## 2014-11-03 DIAGNOSIS — I10 Essential (primary) hypertension: Secondary | ICD-10-CM | POA: Insufficient documentation

## 2014-11-03 DIAGNOSIS — Z87891 Personal history of nicotine dependence: Secondary | ICD-10-CM | POA: Diagnosis not present

## 2014-11-03 DIAGNOSIS — R0602 Shortness of breath: Secondary | ICD-10-CM | POA: Diagnosis not present

## 2014-11-03 DIAGNOSIS — R079 Chest pain, unspecified: Secondary | ICD-10-CM | POA: Insufficient documentation

## 2014-11-03 LAB — COMPREHENSIVE METABOLIC PANEL
ALK PHOS: 81 U/L (ref 38–126)
ALT: 27 U/L (ref 14–54)
ANION GAP: 8 (ref 5–15)
AST: 24 U/L (ref 15–41)
Albumin: 4.7 g/dL (ref 3.5–5.0)
BUN: 11 mg/dL (ref 6–20)
CHLORIDE: 101 mmol/L (ref 101–111)
CO2: 28 mmol/L (ref 22–32)
Calcium: 9.4 mg/dL (ref 8.9–10.3)
Creatinine, Ser: 0.87 mg/dL (ref 0.44–1.00)
GFR calc Af Amer: >60 mL/min (ref >60)
GFR calc non Af Amer: >60 mL/min (ref >60)
GLUCOSE: 94 mg/dL (ref 65–99)
POTASSIUM: 4.2 mmol/L (ref 3.5–5.1)
Sodium: 137 mmol/L (ref 135–145)
Total Bilirubin: 0.4 mg/dL (ref 0.3–1.2)
Total Protein: 8.7 g/dL — ABNORMAL HIGH (ref 6.5–8.1)

## 2014-11-03 LAB — CBC
HEMATOCRIT: 42.6 % (ref 35.0–47.0)
HEMOGLOBIN: 14.4 g/dL (ref 12.0–16.0)
MCH: 29.7 pg (ref 26.0–34.0)
MCHC: 33.8 g/dL (ref 32.0–36.0)
MCV: 87.9 fL (ref 80.0–100.0)
Platelets: 279 10*3/uL (ref 150–440)
RBC: 4.84 MIL/uL (ref 3.80–5.20)
RDW: 12.8 % (ref 11.5–14.5)
WBC: 8.3 10*3/uL (ref 3.6–11.0)

## 2014-11-03 LAB — TROPONIN I
Troponin I: <0.03 ng/mL (ref <0.031)
Troponin I: <0.03 ng/mL (ref <0.031)

## 2014-11-03 MED ORDER — AMLODIPINE BESYLATE 5 MG PO TABS: 5.0000 mg | ORAL_TABLET | Freq: Every day | ORAL | Status: DC

## 2014-11-03 MED ORDER — MECLIZINE HCL 25 MG PO TABS
25.0000 mg | ORAL_TABLET | Freq: Once | ORAL | Status: AC
  Administered 2014-11-03: 25 mg via ORAL
  Filled 2014-11-03: qty 1

## 2014-11-03 MED ORDER — MECLIZINE HCL 25 MG PO TABS: 25.0000 mg | ORAL_TABLET | Freq: Three times a day (TID) | ORAL | Status: DC | PRN

## 2014-11-03 MED ORDER — MECLIZINE HCL 25 MG PO TABS
ORAL_TABLET | ORAL | Status: AC
  Administered 2014-11-03: 25 mg via ORAL
  Filled 2014-11-03: qty 1

## 2014-11-03 NOTE — ED Notes (Signed)
Pt uprite on stretcher in exam room with no distress noted; reports yesterday had episode of mid CP accomp by light-headedness and then again today while at work; st pain radiated into right arm; hx of same but exam was WNL

## 2014-11-03 NOTE — Discharge Instructions (Signed)

## 2014-11-03 NOTE — ED Notes (Signed)
Pt placed in subwait area.  Seth Bake RN, charge nurse, aware that pt is in [place].

## 2014-11-03 NOTE — ED Provider Notes (Signed)
[facility] Emergency Department Provider Note     Time seen: ----------------------------------------- 9:17 PM on 11/03/2014 -----------------------------------------    I have reviewed the triage vital signs and the nursing notes.   HISTORY  Chief Complaint Chest Pain    HPI Brianna Foster is a 59 y.o. female who presents ER for chest pain started yesterday, it has been intermittent, nothing makes it better or worse. States she got flushed with it, maybe felt a little bit short of breath. She denies any recent illness, no she's been out of her amlodipine. His history of chest pain before.   Past Medical History  Diagnosis Date  . Hypertension   . GERD (gastroesophageal reflux disease)   . KNLZJQBH(419.3)     Patient Active Problem List   Diagnosis Date Noted  . Chest pain 05/10/2013  . Tobacco abuse 07/23/2012  . Chest pain 06/18/2011  . Gastroesophageal reflux disease 06/18/2011  . Hypertension 06/18/2011  . No significant past medical history     Past Surgical History  Procedure Laterality Date  . Neck fusion    . Left heart catheterization with coronary angiogram N/A 05/11/2013    Procedure: LEFT HEART CATHETERIZATION WITH CORONARY ANGIOGRAM;  Surgeon: Burnell Blanks, MD;  Location: [facility] CATH LAB;  Service: Cardiovascular;  Laterality: N/A;    Allergies Review of patient's allergies indicates no known allergies.  Social History History  Substance Use Topics  . Smoking status: Former Smoker    Types: Cigarettes    Quit date: 04/10/2011  . Smokeless tobacco: Never Used  . Alcohol Use: Yes     Comment: Occassional Use    Review of Systems Constitutional: Negative for fever. Eyes: Negative for visual changes. ENT: Negative for sore throat. Cardiovascular: Positive for chest pain Respiratory: Positive shortness of breath Gastrointestinal: Negative for abdominal pain, vomiting and diarrhea. Genitourinary: Negative for  dysuria. Musculoskeletal: Negative for back pain. Skin: Negative for rash. Neurological: Negative for headaches, focal weakness or numbness.  10-point ROS otherwise negative.  ____________________________________________   PHYSICAL EXAM:  VITAL SIGNS: ED Triage Vitals  Enc Vitals Group     BP 11/03/14 1945 176/86 mmHg     Pulse Rate 11/03/14 1945 70     Resp --      Temp 11/03/14 1945 98.4 F (36.9 C)     Temp Source 11/03/14 1945 Oral     SpO2 11/03/14 1945 96 %     Weight 11/03/14 1945 220 lb (99.791 kg)     Height 11/03/14 1945 5\' 9"  (1.753 m)     Head Cir --      Peak Flow --      Pain Score 11/03/14 2006 10     Pain Loc --      Pain Edu? --      Excl. in [place]? --     Constitutional: Alert and oriented. Well appearing and in no distress. Eyes: Conjunctivae are normal. PERRL. Normal extraocular movements. ENT   Head: Normocephalic and atraumatic.   Nose: No congestion/rhinnorhea.   Mouth/Throat: Mucous membranes are moist.   Neck: No stridor. Cardiovascular: Normal rate, regular rhythm. Normal and symmetric distal pulses are present in all extremities. No murmurs, rubs, or gallops. Respiratory: Normal respiratory effort without tachypnea nor retractions. Breath sounds are clear and equal bilaterally. No wheezes/rales/rhonchi. Gastrointestinal: Soft and nontender. No distention. No abdominal bruits.  Musculoskeletal: Nontender with normal range of motion in all extremities. No joint effusions.  No lower extremity tenderness nor edema. Neurologic:  Normal speech and language. No gross focal neurologic deficits are appreciated. Speech is normal. No gait instability. Skin:  Skin is warm, dry and intact. No rash noted. Psychiatric: Mood and affect are normal. Speech and behavior are normal. Patient exhibits appropriate insight and judgment. ____________________________________________  EKG: Interpreted by me. Normal sinus rhythm with a rate of 68 bpm, LVH,  normal axis. No evidence of acute infarction. Intervals are normal.  ____________________________________________  ED COURSE:  Pertinent labs & imaging results that were available during my care of the patient were reviewed by me and considered in my medical decision making (see chart for details). We'll check cardiac labs perhaps a second troponin and chest x-ray ____________________________________________    LABS (pertinent positives/negatives)  Labs Reviewed  COMPREHENSIVE METABOLIC PANEL - Abnormal; Notable for the following:    Total Protein 8.7 (*)    All other components within normal limits  CBC  TROPONIN I  TROPONIN I    RADIOLOGY Images were viewed by me  Chest x-ray FINDINGS: The heart size and mediastinal contours are within normal limits. Both lungs are clear. No evidence of pneumothorax or pleural effusion. Lower cervical spine fusion hardware noted.  IMPRESSION: No active cardiopulmonary disease. ____________________________________________  FINAL ASSESSMENT AND PLAN  Chest pain  Plan: Patient with labs and imaging as dictated above. Patient is in no acute distress, troponin is negative 2. She is stable for follow-up with cardiology. Likely GERD. Advised to start taking Zantac twice daily.   Earleen Newport, MD   Earleen Newport, MD 11/03/14 [phone]

## 2014-11-03 NOTE — ED Notes (Signed)
Pt reports intermittent chest pain today.  Taking nexium without relief.  Pt also reports sob.  Nonsmoker.  Pt states pain radiates into right arm .   No n/v/d.  Pt reports having hot flashes.

## 2014-12-28 ENCOUNTER — Encounter: Payer: Self-pay | Admitting: Emergency Medicine

## 2014-12-28 ENCOUNTER — Other Ambulatory Visit: Payer: Self-pay

## 2014-12-28 ENCOUNTER — Emergency Department
Admission: EM | Admit: 2014-12-28 | Discharge: 2014-12-28 | Disposition: A | Discharge status: Home / Self Care | Payer: No Typology Code available for payment source | Attending: Emergency Medicine | Admitting: Emergency Medicine

## 2014-12-28 DIAGNOSIS — R1084 Generalized abdominal pain: Secondary | ICD-10-CM | POA: Diagnosis present

## 2014-12-28 DIAGNOSIS — Z87891 Personal history of nicotine dependence: Secondary | ICD-10-CM | POA: Insufficient documentation

## 2014-12-28 DIAGNOSIS — Z79899 Other long term (current) drug therapy: Secondary | ICD-10-CM | POA: Diagnosis not present

## 2014-12-28 DIAGNOSIS — Z7982 Long term (current) use of aspirin: Secondary | ICD-10-CM | POA: Diagnosis not present

## 2014-12-28 DIAGNOSIS — R1013 Epigastric pain: Secondary | ICD-10-CM

## 2014-12-28 DIAGNOSIS — I10 Essential (primary) hypertension: Secondary | ICD-10-CM | POA: Diagnosis not present

## 2014-12-28 DIAGNOSIS — K2100 Gastro-esophageal reflux disease with esophagitis, without bleeding: Secondary | ICD-10-CM

## 2014-12-28 DIAGNOSIS — Z791 Long term (current) use of non-steroidal anti-inflammatories (NSAID): Secondary | ICD-10-CM | POA: Insufficient documentation

## 2014-12-28 LAB — LIPASE, BLOOD: LIPASE: 36 U/L (ref 22–51)

## 2014-12-28 LAB — URINALYSIS COMPLETE WITH MICROSCOPIC (ARMC ONLY)
BACTERIA UA: NONE SEEN
Bilirubin Urine: NEGATIVE
Glucose, UA: NEGATIVE mg/dL
HGB URINE DIPSTICK: NEGATIVE
Ketones, ur: NEGATIVE mg/dL
LEUKOCYTES UA: NEGATIVE
NITRITE: NEGATIVE
PH: 6 (ref 5.0–8.0)
PROTEIN: NEGATIVE mg/dL
SPECIFIC GRAVITY, URINE: 1.021 (ref 1.005–1.030)

## 2014-12-28 LAB — CBC
HCT: 40.5 % (ref 35.0–47.0)
Hemoglobin: 13.7 g/dL (ref 12.0–16.0)
MCH: 29.6 pg (ref 26.0–34.0)
MCHC: 33.8 g/dL (ref 32.0–36.0)
MCV: 87.6 fL (ref 80.0–100.0)
Platelets: 290 10*3/uL (ref 150–440)
RBC: 4.62 MIL/uL (ref 3.80–5.20)
RDW: 12.6 % (ref 11.5–14.5)
WBC: 6.4 10*3/uL (ref 3.6–11.0)

## 2014-12-28 LAB — COMPREHENSIVE METABOLIC PANEL
ALBUMIN: 4.5 g/dL (ref 3.5–5.0)
ALT: 29 U/L (ref 14–54)
AST: 28 U/L (ref 15–41)
Alkaline Phosphatase: 85 U/L (ref 38–126)
Anion gap: 7 (ref 5–15)
BUN: 10 mg/dL (ref 6–20)
CHLORIDE: 105 mmol/L (ref 101–111)
CO2: 28 mmol/L (ref 22–32)
Calcium: 9 mg/dL (ref 8.9–10.3)
Creatinine, Ser: 0.71 mg/dL (ref 0.44–1.00)
GFR calc Af Amer: >60 mL/min (ref >60)
Glucose, Bld: 93 mg/dL (ref 65–99)
POTASSIUM: 3.5 mmol/L (ref 3.5–5.1)
Sodium: 140 mmol/L (ref 135–145)
Total Bilirubin: 0.6 mg/dL (ref 0.3–1.2)
Total Protein: 8.2 g/dL — ABNORMAL HIGH (ref 6.5–8.1)

## 2014-12-28 MED ORDER — FAMOTIDINE 40 MG PO TABS
40.0000 mg | ORAL_TABLET | Freq: Once | ORAL | Status: AC
  Administered 2014-12-28: 40 mg via ORAL
  Filled 2014-12-28: qty 1

## 2014-12-28 MED ORDER — SUCRALFATE 1 G PO TABS: 1.0000 g | ORAL_TABLET | Freq: Four times a day (QID) | ORAL | Status: DC

## 2014-12-28 MED ORDER — GI COCKTAIL ~~LOC~~
30.0000 mL | ORAL | Status: AC
  Administered 2014-12-28: 30 mL via ORAL
  Filled 2014-12-28: qty 30

## 2014-12-28 MED ORDER — FAMOTIDINE 20 MG PO TABS
ORAL_TABLET | ORAL | Status: AC
  Filled 2014-12-28: qty 2

## 2014-12-28 MED ORDER — RANITIDINE HCL 150 MG PO CAPS: 150.0000 mg | ORAL_CAPSULE | Freq: Two times a day (BID) | ORAL | Status: DC

## 2014-12-28 NOTE — ED Provider Notes (Signed)
[facility] Emergency Department Leighton Brickley Note  ____________________________________________  Time seen: 2:40 PM  I have reviewed the triage vital signs and the nursing notes.   HISTORY  Chief Complaint Abdominal Pain; Emesis; and Diarrhea    HPI Brianna Foster is a 59 y.o. female who complains of feeling like she is developing a viral illness over the last 2 days with nausea vomiting diarrhea that started on Sunday. She developed epigastric pain that felt like her GERD was worsened despite being compliant with her Nexium. She also reports some mild generalized abdominal pain. She continued having nausea and occasional vomiting and loose bowel movements yesterday, but today she just has epigastric pain and nausea but no vomiting and no diarrhea. No fevers or chills, no chest pain shortness of breath dizziness or syncope. No back pain.     Past Medical History  Diagnosis Date  . Hypertension   . GERD (gastroesophageal reflux disease)   . JQBHALPF(790.2)      Patient Active Problem List   Diagnosis Date Noted  . Chest pain 05/10/2013  . Tobacco abuse 07/23/2012  . Chest pain 06/18/2011  . Gastroesophageal reflux disease 06/18/2011  . Hypertension 06/18/2011  . No significant past medical history      Past Surgical History  Procedure Laterality Date  . Neck fusion    . Left heart catheterization with coronary angiogram N/A 05/11/2013    Procedure: LEFT HEART CATHETERIZATION WITH CORONARY ANGIOGRAM;  Surgeon: Burnell Blanks, MD;  Location: [facility] CATH LAB;  Service: Cardiovascular;  Laterality: N/A;     Current Outpatient Rx  Name  Route  Sig  Dispense  Refill  . amLODipine (NORVASC) 5 MG tablet      TAKE 1 TABLET (5 MG TOTAL) BY MOUTH DAILY.   90 tablet   0   . amLODipine (NORVASC) 5 MG tablet   Oral   Take 1 tablet (5 mg total) by mouth daily.   30 tablet   11   . aspirin EC 81 MG tablet   Oral   Take 1 tablet (81 mg total) by  mouth daily.         . Cholecalciferol (VITAMIN D PO)   Oral   Take 1 tablet by mouth daily.         [name] EXPIRED: esomeprazole (NEXIUM) 40 MG capsule   Oral   Take 1 capsule (40 mg total) by mouth daily.   30 capsule   3   . esomeprazole (NEXIUM) 40 MG capsule   Oral   Take 1 capsule (40 mg total) by mouth daily.   30 capsule   3   . ibuprofen (ADVIL,MOTRIN) 600 MG tablet   Oral   Take 1 tablet (600 mg total) by mouth 3 (three) times daily.   30 tablet   1   . meclizine (ANTIVERT) 25 MG tablet   Oral   Take 1 tablet (25 mg total) by mouth 3 (three) times daily as needed for dizziness or nausea.   30 tablet   1   . omeprazole (PRILOSEC) 40 MG capsule   Oral   Take 1 capsule (40 mg total) by mouth daily.   90 capsule   2   . ranitidine (ZANTAC) 150 MG capsule   Oral   Take 1 capsule (150 mg total) by mouth 2 (two) times daily.   28 capsule   0   . sucralfate (CARAFATE) 1 G tablet   Oral   Take 1 tablet (  1 g total) by mouth 4 (four) times daily.   120 tablet   1   . traMADol (ULTRAM) 50 MG tablet               . triamterene-hydrochlorothiazide (MAXZIDE-25) 37.5-25 MG per tablet      TAKE 1 TABLET BY MOUTH EVERY DAY   90 tablet   0     Pt needs to establish with new PCP.   [name] venlafaxine XR (EFFEXOR XR) 37.5 MG 24 hr capsule   Oral   Take 1 capsule (37.5 mg total) by mouth daily with breakfast.   30 capsule   3      Allergies Review of patient's allergies indicates no known allergies.   Family History  Problem Relation Age of Onset  . Heart disease Brother     Pacemaker    Social History Social History  Substance Use Topics  . Smoking status: Former Smoker    Types: Cigarettes    Quit date: 04/10/2011  . Smokeless tobacco: Never Used  . Alcohol Use: No     Comment: Occassional Use    Review of Systems  Constitutional:   No fever or chills. No weight changes Eyes:   No blurry vision or double vision.  ENT:   Positive sore  throat. Cardiovascular:   No chest pain. Respiratory:   No dyspnea or cough. Gastrointestinal:   Generalized and epigastric abdominal pain as above, vomiting and diarrhea as above.[name]  No BRBPR or melena. Genitourinary:   Negative for dysuria, urinary retention, bloody urine, or difficulty urinating. Musculoskeletal:   Negative for back pain. No joint swelling or pain. Skin:   Negative for rash. Neurological:   Negative for headaches, focal weakness or numbness. Psychiatric:  No anxiety or depression.   Endocrine:  No hot/cold intolerance, changes in energy, or sleep difficulty.  10-point ROS otherwise negative.  ____________________________________________   PHYSICAL EXAM:  VITAL SIGNS: ED Triage Vitals  Enc Vitals Group     BP 12/28/14 1244 140/79 mmHg     Pulse Rate 12/28/14 1244 62     Resp 12/28/14 1244 16     Temp 12/28/14 1244 98.2 F (36.8 C)     Temp Source 12/28/14 1244 Oral     SpO2 12/28/14 1244 98 %     Weight 12/28/14 1244 225 lb (102.059 kg)     Height 12/28/14 1244 5\' 9"  (1.753 m)     Head Cir --      Peak Flow --      Pain Score 12/28/14 1245 10     Pain Loc --      Pain Edu? --      Excl. in [place]? --      Constitutional:   Alert and oriented. Well appearing and in no distress. Eyes:   No scleral icterus. No conjunctival pallor. PERRL. EOMI ENT   Head:   Normocephalic and atraumatic.   Nose:   No congestion/rhinnorhea. No septal hematoma   Mouth/Throat:   MMM, mild pharyngeal erythema. No peritonsillar mass. No uvula shift.   Neck:   No stridor. No SubQ emphysema. No meningismus. Hematological/Lymphatic/Immunilogical:   No cervical lymphadenopathy. Cardiovascular:   RRR. Normal and symmetric distal pulses are present in all extremities. No murmurs, rubs, or gallops. Respiratory:   Normal respiratory effort without tachypnea nor retractions. Breath sounds are clear and equal bilaterally. No wheezes/rales/rhonchi. Gastrointestinal:   Soft with  left upper quadrant and epigastric tenderness. No distention. There is no CVA  tenderness.  No rebound, rigidity, or guarding. Genitourinary:   deferred Musculoskeletal:   Nontender with normal range of motion in all extremities. No joint effusions.  No lower extremity tenderness.  No edema. Neurologic:   Normal speech and language.  CN 2-10 normal. Motor grossly intact. No pronator drift.  Normal gait. No gross focal neurologic deficits are appreciated.  Skin:    Skin is warm, dry and intact. No rash noted.  No petechiae, purpura, or bullae. Psychiatric:   Mood and affect are normal. Speech and behavior are normal. Patient exhibits appropriate insight and judgment.  ____________________________________________    LABS (pertinent positives/negatives) (all labs ordered are listed, but only abnormal results are displayed) Labs Reviewed  COMPREHENSIVE METABOLIC PANEL - Abnormal; Notable for the following:    Total Protein 8.2 (*)    All other components within normal limits  URINALYSIS COMPLETEWITH MICROSCOPIC ([facility] ONLY) - Abnormal; Notable for the following:    Color, Urine YELLOW (*)    APPearance CLEAR (*)    Squamous Epithelial / LPF 0-5 (*)    All other components within normal limits  LIPASE, BLOOD  CBC   ____________________________________________   EKG  Interpreted by me Normal sinus rhythm rate of 60. Normal axis intervals. Left ventricular hypertrophy. Normal ST segments and T waves.  ____________________________________________    RADIOLOGY    ____________________________________________   PROCEDURES   ____________________________________________   INITIAL IMPRESSION / ASSESSMENT AND PLAN / ED COURSE  Pertinent labs & imaging results that were available during my care of the patient were reviewed by me and considered in my medical decision making (see chart for details).  Patient presents with epigastric pain with history and exam consistent with  GERD exacerbation. Labs are reassuring. No evidence of AAA or cholecystitis. Otherwise well-appearing nontoxic no acute distress. Has a follow-up appointment with her doctor in 1 week. We'll start her on dual antacid therapy to complement her Nexium as a trial until she sees her doctor for consideration of GI referral.     ____________________________________________   FINAL CLINICAL IMPRESSION(S) / ED DIAGNOSES  Final diagnoses:  Epigastric pain  Gastroesophageal reflux disease with esophagitis      Carrie Mew, MD 12/28/14 [phone]

## 2014-12-28 NOTE — Discharge Instructions (Signed)
Abdominal Pain °Many things can cause abdominal pain. Usually, abdominal pain is not caused by a disease and will improve without treatment. It can often be observed and treated at home. Your health care provider will do a physical exam and possibly order blood tests and X-rays to help determine the seriousness of your pain. However, in many cases, more time must pass before a clear cause of the pain can be found. Before that point, your health care provider may not know if you need more testing or further treatment. °HOME CARE INSTRUCTIONS  °Monitor your abdominal pain for any changes. The following actions may help to alleviate any discomfort you are experiencing: °· Only take over-the-counter or prescription medicines as directed by your health care provider. °· Do not take laxatives unless directed to do so by your health care provider. °· Try a clear liquid diet (broth, tea, or water) as directed by your health care provider. Slowly move to a bland diet as tolerated. °SEEK MEDICAL CARE IF: °· You have unexplained abdominal pain. °· You have abdominal pain associated with nausea or diarrhea. °· You have pain when you urinate or have a bowel movement. °· You experience abdominal pain that wakes you in the night. °· You have abdominal pain that is worsened or improved by eating food. °· You have abdominal pain that is worsened with eating fatty foods. °· You have a fever. °SEEK IMMEDIATE MEDICAL CARE IF:  °· Your pain does not go away within 2 hours. °· You keep throwing up (vomiting). °· Your pain is felt only in portions of the abdomen, such as the right side or the left lower portion of the abdomen. °· You pass bloody or black tarry stools. °MAKE SURE YOU: °· Understand these instructions.   °· Will watch your condition.   °· Will get help right away if you are not doing well or get worse.   °Document Released: 01/03/2005 Document Revised: 03/31/2013 Document Reviewed: 12/03/2012 °[employer]® Patient Information  ©2015 [employer]. This information is not intended to replace advice given to you by your health care provider. Make sure you discuss any questions you have with your health care provider. ° °Gastroesophageal Reflux Disease, Adult °Gastroesophageal reflux disease (GERD) happens when acid from your stomach goes into your food pipe (esophagus). The acid can cause a burning feeling in your chest. Over time, the acid can make small holes (ulcers) in your food pipe.  °HOME CARE °· Ask your doctor for advice about: °¨ Losing weight. °¨ Quitting smoking. °¨ Alcohol use. °· Avoid foods and drinks that make your problems worse. You may want to avoid: °¨ Caffeine and alcohol. °¨ Chocolate. °¨ Mints. °¨ Garlic and onions. °¨ Spicy foods. °¨ Citrus fruits, such as oranges, lemons, or limes. °¨ Foods that contain tomato, such as sauce, chili, salsa, and pizza. °¨ Fried and fatty foods. °· Avoid lying down for 3 hours before you go to bed or before you take a nap. °· Eat small meals often, instead of large meals. °· Wear loose-fitting clothing. Do not wear anything tight around your waist. °· Raise (elevate) the head of your bed 6 to 8 inches with wood blocks. Using extra pillows does not help. °· Only take medicines as told by your doctor. °· Do not take aspirin or ibuprofen. °GET HELP RIGHT AWAY IF:  °· You have pain in your arms, neck, jaw, teeth, or back. °· Your pain gets worse or changes. °· You feel sick to your stomach (nauseous), throw up (  vomit), or sweat (diaphoresis). °· You feel short of breath, or you pass out (faint). °· Your throw up is green, yellow, black, or looks like coffee grounds or blood. °· Your poop (stool) is red, bloody, or black. °MAKE SURE YOU:  °· Understand these instructions. °· Will watch your condition. °· Will get help right away if you are not doing well or get worse. °Document Released: 09/12/2007 Document Revised: 06/18/2011 Document Reviewed: 10/13/2010 °Exit[employer]® Patient Information  ©2015 [employer]. This information is not intended to replace advice given to you by your health care provider. Make sure you discuss any questions you have with your health care provider. ° °

## 2014-12-28 NOTE — ED Notes (Signed)
Says nvd on Sunday.  Today tried to go to work and now has severe pain mid epigastric area that increases with breathing

## 2015-01-05 ENCOUNTER — Ambulatory Visit (INDEPENDENT_AMBULATORY_CARE_PROVIDER_SITE_OTHER): Payer: No Typology Code available for payment source | Admitting: Family

## 2015-01-05 ENCOUNTER — Other Ambulatory Visit (HOSPITAL_COMMUNITY)
Admission: RE | Admit: 2015-01-05 | Discharge: 2015-01-05 | Disposition: A | Discharge status: Home / Self Care | Payer: No Typology Code available for payment source | Source: Ambulatory Visit | Attending: Family Medicine | Admitting: Family Medicine

## 2015-01-05 ENCOUNTER — Encounter: Payer: Self-pay | Admitting: Family

## 2015-01-05 VITALS — BP 124/74 | HR 74 | Temp 98.0°F | Wt 230.0 lb

## 2015-01-05 DIAGNOSIS — Z720 Tobacco use: Secondary | ICD-10-CM

## 2015-01-05 DIAGNOSIS — K2100 Gastro-esophageal reflux disease with esophagitis, without bleeding: Secondary | ICD-10-CM

## 2015-01-05 DIAGNOSIS — R0683 Snoring: Secondary | ICD-10-CM

## 2015-01-05 DIAGNOSIS — I10 Essential (primary) hypertension: Secondary | ICD-10-CM

## 2015-01-05 DIAGNOSIS — Z124 Encounter for screening for malignant neoplasm of cervix: Secondary | ICD-10-CM

## 2015-01-05 DIAGNOSIS — Z0000 Encounter for general adult medical examination without abnormal findings: Secondary | ICD-10-CM

## 2015-01-05 DIAGNOSIS — Z01419 Encounter for gynecological examination (general) (routine) without abnormal findings: Secondary | ICD-10-CM | POA: Diagnosis not present

## 2015-01-05 LAB — CBC WITH DIFFERENTIAL/PLATELET
BASOS PCT: 0.3 % (ref 0.0–3.0)
Basophils Absolute: 0 10*3/uL (ref 0.0–0.1)
EOS PCT: 1.8 % (ref 0.0–5.0)
Eosinophils Absolute: 0.1 10*3/uL (ref 0.0–0.7)
HCT: 41.5 % (ref 36.0–46.0)
Hemoglobin: 14 g/dL (ref 12.0–15.0)
LYMPHS ABS: 1.8 10*3/uL (ref 0.7–4.0)
Lymphocytes Relative: 32.2 % (ref 12.0–46.0)
MCHC: 33.6 g/dL (ref 30.0–36.0)
MCV: 88.4 fl (ref 78.0–100.0)
MONO ABS: 0.7 10*3/uL (ref 0.1–1.0)
MONOS PCT: 12.2 % — AB (ref 3.0–12.0)
NEUTROS ABS: 3 10*3/uL (ref 1.4–7.7)
NEUTROS PCT: 53.5 % (ref 43.0–77.0)
PLATELETS: 316 10*3/uL (ref 150.0–400.0)
RBC: 4.7 Mil/uL (ref 3.87–5.11)
RDW: 12.9 % (ref 11.5–15.5)
WBC: 5.5 10*3/uL (ref 4.0–10.5)

## 2015-01-05 LAB — POCT URINALYSIS DIPSTICK
BILIRUBIN UA: NEGATIVE
GLUCOSE UA: NEGATIVE
KETONES UA: NEGATIVE
Leukocytes, UA: NEGATIVE
NITRITE UA: NEGATIVE
Protein, UA: NEGATIVE
RBC UA: NEGATIVE
Spec Grav, UA: 1.02
Urobilinogen, UA: 0.2
pH, UA: 7

## 2015-01-05 LAB — TSH: TSH: 1.55 u[IU]/mL (ref 0.35–4.50)

## 2015-01-05 LAB — COMPREHENSIVE METABOLIC PANEL
ALK PHOS: 77 U/L (ref 39–117)
ALT: 25 U/L (ref 0–35)
AST: 20 U/L (ref 0–37)
Albumin: 4.4 g/dL (ref 3.5–5.2)
BILIRUBIN TOTAL: 0.4 mg/dL (ref 0.2–1.2)
BUN: 9 mg/dL (ref 6–23)
CALCIUM: 9.4 mg/dL (ref 8.4–10.5)
CO2: 32 mEq/L (ref 19–32)
Chloride: 104 mEq/L (ref 96–112)
Creatinine, Ser: 0.76 mg/dL (ref 0.40–1.20)
GFR: 100.57 mL/min (ref >60.00)
GLUCOSE: 97 mg/dL (ref 70–99)
POTASSIUM: 4.1 meq/L (ref 3.5–5.1)
Sodium: 141 mEq/L (ref 135–145)
TOTAL PROTEIN: 7.8 g/dL (ref 6.0–8.3)

## 2015-01-05 LAB — LIPID PANEL
CHOLESTEROL: 199 mg/dL (ref 0–200)
HDL: 55.4 mg/dL (ref >39.00)
LDL Cholesterol: 127 mg/dL — ABNORMAL HIGH (ref 0–99)
NonHDL: 143.56
TRIGLYCERIDES: 83 mg/dL (ref 0.0–149.0)
Total CHOL/HDL Ratio: 4
VLDL: 16.6 mg/dL (ref 0.0–40.0)

## 2015-01-05 MED ORDER — AMLODIPINE BESYLATE 5 MG PO TABS: ORAL_TABLET | ORAL | Status: DC

## 2015-01-05 MED ORDER — ESOMEPRAZOLE MAGNESIUM 40 MG PO CPDR: 40.0000 mg | DELAYED_RELEASE_CAPSULE | Freq: Every day | ORAL | Status: DC

## 2015-01-05 MED ORDER — VENLAFAXINE HCL ER 37.5 MG PO CP24: 37.5000 mg | ORAL_CAPSULE | Freq: Every day | ORAL | Status: DC

## 2015-01-05 MED ORDER — VALACYCLOVIR HCL 1 G PO TABS: 1000.0000 mg | ORAL_TABLET | Freq: Three times a day (TID) | ORAL | Status: DC

## 2015-01-05 MED ORDER — TRIAMTERENE-HCTZ 37.5-25 MG PO TABS: 1.0000 | ORAL_TABLET | Freq: Every day | ORAL | Status: DC

## 2015-01-05 NOTE — Progress Notes (Signed)
Pre visit review using our clinic review tool, if applicable. No additional management support is needed unless otherwise documented below in the visit note. 

## 2015-01-05 NOTE — Patient Instructions (Signed)

## 2015-01-05 NOTE — Progress Notes (Signed)
Subjective:    Patient ID: Brianna Foster, female    DOB: [birth year], 59 y.o.   MRN: [id number]  HPI  59 year old AAF, is in today for CPX with pap smear.  This is a routine wellness  examination for this patient . I reviewed all health maintenance protocols including mammography, colonoscopy, bone density Needed referrals were placed. Age and diagnosis  appropriate screening labs were ordered. Her immunization history was reviewed and appropriate vaccinations were ordered. Her current medications and allergies were reviewed and needed refills of her chronic medications were ordered. The plan for yearly health maintenance was discussed all orders and referrals were made as appropriate.  Review of Systems  Constitutional: Negative.   HENT: Negative.   Eyes: Negative.   Respiratory: Negative.   Cardiovascular: Negative.   Gastrointestinal: Negative.   Endocrine: Negative.   Genitourinary: Negative.   Musculoskeletal: Negative.   Skin: Negative.   Allergic/Immunologic: Negative.   Neurological: Negative.   Hematological: Negative.   Psychiatric/Behavioral: Negative.    Past Medical History  Diagnosis Date  . Hypertension   . GERD (gastroesophageal reflux disease)   . Headache(784.0)     Social History   Social History  . Marital Status: Divorced    Spouse Name: N/A  . Number of Children: 1  . 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 Smoking status: Former Smoker    Types: Cigarettes    Quit date: 04/10/2011  . Smokeless tobacco: Never Used  . Alcohol Use: No     Comment: Occassional Use  . Drug Use: No  . Sexual Activity: Not Currently    Birth Control/ Protection: Post-menopausal   Other Topics Concern  . Not on file   Social History Narrative    Past Surgical History  Procedure Laterality Date  . Neck fusion    . Left heart catheterization with coronary angiogram N/A 05/11/2013   Procedure: LEFT HEART CATHETERIZATION WITH CORONARY ANGIOGRAM;  Surgeon: Burnell Blanks, MD;  Location: [facility] CATH LAB;  Service: Cardiovascular;  Laterality: N/A;    Family History  Problem Relation Age of Onset  . Heart disease Brother     Pacemaker    No Known Allergies  Current Outpatient Prescriptions on File Prior to Visit  Medication Sig Dispense Refill  . aspirin EC 81 MG tablet Take 1 tablet (81 mg total) by mouth daily.    . Cholecalciferol (VITAMIN D PO) Take 1 tablet by mouth daily.    [name] ibuprofen (ADVIL,MOTRIN) 600 MG tablet Take 1 tablet (600 mg total) by mouth 3 (three) times daily. 30 tablet 1  . meclizine (ANTIVERT) 25 MG tablet Take 1 tablet (25 mg total) by mouth 3 (three) times daily as needed for dizziness or nausea. 30 tablet 1  . sucralfate (CARAFATE) 1 G tablet Take 1 tablet (1 g total) by mouth 4 (four) times daily. 120 tablet 1   No current facility-administered medications on file prior to visit.    BP 124/74 mmHg  Pulse 74  Temp(Src) 98 F (36.7 C) (Oral)  Wt 230 lb (104.327 kg)  SpO2 96%  LMP 01/07/2010 (LMP Unknown)chart    Objective:   Physical Exam  Constitutional: She is oriented to person, place, and time. She appears well-developed and well-nourished.  HENT:  Head: Normocephalic.  Right Ear: External ear normal.  Left Ear: External ear normal.  Nose: Nose normal.  Mouth/Throat: Oropharynx is clear and moist.  Eyes: Conjunctivae are normal. Pupils are equal, round, and reactive to light.  Neck: Normal range of motion. Neck supple. No thyromegaly present.  Cardiovascular: Normal rate, regular rhythm and normal heart sounds.   Pulmonary/Chest: Effort normal and breath sounds normal.  Abdominal: Soft. Bowel sounds are normal.  Genitourinary: Vagina normal and uterus normal. Guaiac negative stool. No vaginal discharge found.  Musculoskeletal: Normal range of motion. She exhibits no edema or tenderness.  Neurological: She is alert and  oriented to person, place, and time. She has normal reflexes. She displays normal reflexes. No cranial nerve deficit. Coordination normal.  Skin: Skin is warm and dry.  Psychiatric: She has a normal mood and affect.          Assessment & Plan:  Brianna Foster was seen today for annual exam.  Diagnoses and all orders for this visit:  Preventative health care -     CMP -     Lipid Panel -     CBC with Differential -     TSH -     POC Urinalysis Dipstick -     Ambulatory referral to Gastroenterology  Essential hypertension -     CMP  Gastroesophageal reflux disease with esophagitis -     Ambulatory referral to Gastroenterology  Tobacco abuse  Screening for malignant neoplasm of cervix -     PAP [[place]]  Snoring -     Ambulatory referral to Pulmonology  Other orders -     valACYclovir (VALTREX) 1000 MG tablet; Take 1 tablet (1,000 mg total) by mouth 3 (three) times daily. -     amLODipine (NORVASC) 5 MG tablet; TAKE 1 TABLET (5 MG TOTAL) BY MOUTH DAILY. -     esomeprazole (NEXIUM) 40 MG capsule; Take 1 capsule (40 mg total) by mouth daily. -     triamterene-hydrochlorothiazide (MAXZIDE-25) 37.5-25 MG tablet; Take 1 tablet by mouth daily. -     venlafaxine XR (EFFEXOR XR) 37.5 MG 24 hr capsule; Take 1 capsule (37.5 mg total) by mouth daily with breakfast.  Call the office with any questions or concerns. Recheck in 6 months and sooner as needed.

## 2015-01-06 LAB — CYTOLOGY - PAP

## 2015-02-25 ENCOUNTER — Encounter: Payer: Self-pay | Admitting: Gastroenterology

## 2015-02-25 ENCOUNTER — Ambulatory Visit (INDEPENDENT_AMBULATORY_CARE_PROVIDER_SITE_OTHER): Payer: No Typology Code available for payment source | Admitting: Internal Medicine

## 2015-02-25 ENCOUNTER — Encounter: Payer: Self-pay | Admitting: Internal Medicine

## 2015-02-25 ENCOUNTER — Ambulatory Visit (INDEPENDENT_AMBULATORY_CARE_PROVIDER_SITE_OTHER): Payer: No Typology Code available for payment source | Admitting: Gastroenterology

## 2015-02-25 VITALS — BP 126/70 | HR 84 | Ht 69.0 in | Wt 241.0 lb

## 2015-02-25 VITALS — BP 136/82 | HR 74 | Ht 69.0 in | Wt 240.0 lb

## 2015-02-25 DIAGNOSIS — K219 Gastro-esophageal reflux disease without esophagitis: Secondary | ICD-10-CM

## 2015-02-25 DIAGNOSIS — G4719 Other hypersomnia: Secondary | ICD-10-CM | POA: Diagnosis not present

## 2015-02-25 DIAGNOSIS — R0683 Snoring: Secondary | ICD-10-CM | POA: Diagnosis not present

## 2015-02-25 DIAGNOSIS — Z1211 Encounter for screening for malignant neoplasm of colon: Secondary | ICD-10-CM

## 2015-02-25 DIAGNOSIS — Z809 Family history of malignant neoplasm, unspecified: Secondary | ICD-10-CM

## 2015-02-25 MED ORDER — OMEPRAZOLE 40 MG PO CPDR: 40.0000 mg | DELAYED_RELEASE_CAPSULE | Freq: Every day | ORAL | Status: DC

## 2015-02-25 MED ORDER — NA SULFATE-K SULFATE-MG SULF 17.5-3.13-1.6 GM/177ML PO SOLN: ORAL | Status: DC

## 2015-02-25 NOTE — Patient Instructions (Signed)
You have been scheduled for a colonoscopy. Please follow written instructions given to you at your visit today.  Please pick up your prep supplies at the pharmacy within the next 1-3 days. If you use inhalers (even only as needed), please bring them with you on the day of your procedure.  We have sent the following medications to your pharmacy for you to pick up at your convenience: Suprep, Omeprazole

## 2015-02-25 NOTE — Progress Notes (Signed)
HPI :  59 y/o female seen in consultation for GERD and CRC screening from [facility] FNP   She reports problems with acid reflux. She reports symptoms longstanding, bothering her intermittently. She reports for the past month she had severe symptoms as well as some nausea. She was seen in the ER recently with chest pains and ruled out for MI, thought to be due to reflux. Her main reflux symptoms are regurgitation and some chest discomfort. She had some odynophagia recently as well. No dysphagia. No weight loss, she is gaining weight. No nausea or vomiting routinely. She is currently taking 40mg  nexium once daily, zantac OTC, usually in the morning. She thinks she is having some breakthrough on this regimen fairly frequently, although she thinks due to dietary changes.   Brother died of esophageal cancer in age 6s and father had stomach cancer died in his 6s. She has had a prior upper endoscopy x 2, in 2001 and in 2006 when in [place] and [place]. She reports they looked okay, no Barrett's, although no report available. She is asking for upper endoscopy given her family history and worsening GERD despite PPI.   She reports she has had a prior colonoscopy in 2000 which she states was normal, no report available. She has had some periodic blood in the stools, usually in the setting of constipation and straining. Normally does not have constipation. She takes periodic stool softeners. No FH of colon cancer.   Past Medical History  Diagnosis Date  . Hypertension   . GERD (gastroesophageal reflux disease)   . [id number])      Past Surgical History  Procedure Laterality Date  . Neck fusion    . Left heart catheterization with coronary angiogram N/A 05/11/2013    Procedure: LEFT HEART CATHETERIZATION WITH CORONARY ANGIOGRAM;  Surgeon: Burnell Blanks, MD;  Location: [facility] CATH LAB;  Service: Cardiovascular;  Laterality: N/A;   Family History  Problem Relation Age of Onset  . Heart  disease Brother     Pacemaker  . Esophageal cancer Brother   . Stomach cancer Father     Social History  Substance Use Topics  . Smoking status: Former Smoker    Types: Cigarettes    Quit date: 07/09/2014  . Smokeless tobacco: Never Used  . Alcohol Use: No     Comment: Occassional Use   Current Outpatient Prescriptions  Medication Sig Dispense Refill  . amLODipine (NORVASC) 5 MG tablet TAKE 1 TABLET (5 MG TOTAL) BY MOUTH DAILY. 90 tablet 1  . aspirin EC 81 MG tablet Take 1 tablet (81 mg total) by mouth daily.    . Cholecalciferol (VITAMIN D PO) Take 1 tablet by mouth daily.    [name] ibuprofen (ADVIL,MOTRIN) 600 MG tablet Take 1 tablet (600 mg total) by mouth 3 (three) times daily. 30 tablet 1  . meclizine (ANTIVERT) 25 MG tablet Take 1 tablet (25 mg total) by mouth 3 (three) times daily as needed for dizziness or nausea. 30 tablet 1  . sucralfate (CARAFATE) 1 G tablet Take 1 tablet (1 g total) by mouth 4 (four) times daily. 120 tablet 1  . triamterene-hydrochlorothiazide (MAXZIDE-25) 37.5-25 MG tablet Take 1 tablet by mouth daily. 90 tablet 1  . valACYclovir (VALTREX) 1000 MG tablet Take 1 tablet (1,000 mg total) by mouth 3 (three) times daily. 21 tablet 5  . venlafaxine XR (EFFEXOR XR) 37.5 MG 24 hr capsule Take 1 capsule (37.5 mg total) by mouth daily with breakfast. 90 capsule 1  .  Na Sulfate-K Sulfate-Mg Sulf SOLN Take as directed per Colonoscopy. 354 mL 0  . omeprazole (PRILOSEC) 40 MG capsule Take 1 capsule (40 mg total) by mouth daily. 90 capsule 3   No current facility-administered medications for this visit.   No Known Allergies   Review of Systems: All systems reviewed and negative except where noted in HPI.   Lab Results  Component Value Date   WBC 5.5 01/05/2015   HGB 14.0 01/05/2015   HCT 41.5 01/05/2015   MCV 88.4 01/05/2015   PLT 316.0 01/05/2015   Lab Results  Component Value Date   ALT 25 01/05/2015   AST 20 01/05/2015   ALKPHOS 77 01/05/2015   BILITOT  0.4 01/05/2015   Lab Results  Component Value Date   CREATININE 0.76 01/05/2015   BUN 9 01/05/2015   NA 141 01/05/2015   K 4.1 01/05/2015   CL 104 01/05/2015   CO2 32 01/05/2015      Physical Exam: BP 126/70 mmHg  Pulse 84  Ht 5\' 9"  (1.753 m)  Wt 241 lb (109.317 kg)  BMI 35.57 kg/m2  LMP 01/07/2010 (LMP Unknown) Constitutional: Pleasant,well-developed, female in no acute distress. HEENT: Normocephalic and atraumatic. Conjunctivae are normal. No scleral icterus. Neck supple.  Cardiovascular: Normal rate, regular rhythm.  Pulmonary/chest: Effort normal and breath sounds normal. No wheezing, rales or rhonchi. Abdominal: Soft, nondistended, protuberant, nontender. Bowel sounds active throughout. There are no masses palpable. No hepatomegaly. Extremities: no edema Lymphadenopathy: No cervical adenopathy noted. Neurological: Alert and oriented to person place and time. Skin: Skin is warm and dry. No rashes noted. Psychiatric: Normal mood and affect. Behavior is normal.   ASSESSMENT AND PLAN: 59 y/o female with longstanding GERD, also with strong FH of esophageal cancer and stomach cancer, here for worsening GERD and colon cancer screening. History as outlined above.  On nexium once daily with zantac she has significant breakthrough. She is also concerned about the cost of nexium. Will try to switch her to omeprazole 40mg  BID given the cost of nexium. I discussed long term risks of PPIs with her and need to use lowest daily dose to control symptoms, however given her severe symptoms she warrants escalation in therapy. She is requesting an EGD in light of symptoms and family history of esophageal cancer. I reassured her I think the likelyhood for esophageal cancer is extremely low, and she does not have dysphagia, but given lack of response to PPI I offered her an exam to ensure no large hiatal hernia and assess for Barrett's. If this exam is normal, we may consider 24 HR pH impedance  testing.   Otherwise we discussed colon cancer screening. She has some mild blood in the stools otherwise, suspect hemorrhoidal, and in this light recommend optical colonoscopy. The indications, risks, and benefits of colonoscopy were explained to the patient in detail. Risks include but are not limited to bleeding, perforation, adverse reaction to medications, and cardiopulmonary compromise. Sequelae include but are not limited to the possibility of surgery, hospitalization, and mortality. The patient verbalized understanding and wished to proceed. All questions answered, referred to the scheduler and bowel prep ordered. Further recommendations pending results of the exam.   McVille Cellar, MD [facility] Pager [phone]  CC: Dutch Quint FNP

## 2015-02-25 NOTE — Patient Instructions (Addendum)
In-Lab Sleep study.    Sleep Apnea Sleep apnea is disorder that affects a person's sleep. A person with sleep apnea has abnormal pauses in their breathing when they sleep. It is hard for them to get a good sleep. This makes a person tired during the day. It also can lead to other physical problems. There are three types of sleep apnea. One type is when breathing stops for a short time because your airway is blocked (obstructive sleep apnea). Another type is when the brain sometimes fails to give the normal signal to breathe to the muscles that control your breathing (central sleep apnea). The third type is a combination of the other two types. HOME CARE   Take all medicine as told by your doctor.  Avoid alcohol, calming medicines (sedatives), and depressant drugs.  Try to lose weight if you are overweight. Talk to your doctor about a healthy weight goal.  Your doctor may have you use a device that helps to open your airway. It can help you get the air that you need. It is called a positive airway pressure (PAP) device.   MAKE SURE YOU:   Understand these instructions.  Will watch your condition.  Will get help right away if you are not doing well or get worse.  It may take approximately 1 month for you to get used to wearing her CPAP every night.  Be sure to work with

## 2015-02-25 NOTE — Progress Notes (Signed)
[facility] Consultation      Assessment and Plan:  Excessive daytime sleepiness. -Symptoms and signs of obstructive sleep apnea. -Patient tells me that she is claustrophobic, and phobic about potentially wearing his CPAP. Her sister wears a CPAP and she does not think since she could've her use that. We discussed the risks and benefits of the various options for treatment of sleep apnea, including CPAP, UPPP, dental device. She thinks that she would prefer to go directly for surgery. Therefore, if her sleep study is positive for moderate to severe sleep apnea, we'll give her referral to ENT for UPPP surgery.  Snoring -Loud snoring and witnessed apneas.  GERD   Essential hypertension   Date: 02/25/2015  MRN# [id number] Brianna Foster 02-18-1956  Referring Physician: Dr. Justin Mend.   Brianna Foster is a 59 y.o. old female seen in consultation for chief complaint of:    Chief Complaint  Patient presents with  . Advice Only    snores; hard to breath when she wakes up; tired during day    HPI:  The patient is a 59 year old female referred for symptoms of excessive daytime sleepiness. She has been told that she snores loudly, and she wakes herself up with snoring. She is sleepy during the day, she works in a call center. She often falls asleep when supervising others on calls. She often takes a nap on the weekend.  The problem has been progressive over the years. She goes to sleep around 1030 and falls asleep easily, she wakes herself up with snoring during the night.   Wakes at 545. No sleepwalking.  She has vertigo and takes antivert about twice per week, one pill. This might make her sleepy for a few hours. She has been prescribed effexor in the past for menopause; but it made her sleepy so she never continued it.  Her sister has sleep apnea, and she wears a mask.   She has gained about 20 lbs in the past 4 months after moving back to [place].  She also notes  severe reflux symptoms during the day, and is seeing a gastroenterologist.  No flowsheet data found.  Pulmonary Functions Testing Results:  No results found for: FEV1, FVC, FEV1FVC, TLC, DLCO   PMHX:   Past Medical History  Diagnosis Date  . Hypertension   . GERD (gastroesophageal reflux disease)   . [id number])    Surgical Hx:  Past Surgical History  Procedure Laterality Date  . Neck fusion    . Left heart catheterization with coronary angiogram N/A 05/11/2013    Procedure: LEFT HEART CATHETERIZATION WITH CORONARY ANGIOGRAM;  Surgeon: Burnell Blanks, MD;  Location: [facility] CATH LAB;  Service: Cardiovascular;  Laterality: N/A;   Family Hx:  Family History  Problem Relation Age of Onset  . Heart disease Brother     Pacemaker   Social Hx:   Social History  Substance Use Topics  . Smoking status: Former Smoker    Types: Cigarettes    Quit date: 04/10/2011  . Smokeless tobacco: Never Used  . Alcohol Use: No     Comment: Occassional Use   Medication:   Current Outpatient Rx  Name  Route  Sig  Dispense  Refill  . amLODipine (NORVASC) 5 MG tablet      TAKE 1 TABLET (5 MG TOTAL) BY MOUTH DAILY.   90 tablet   1   . aspirin EC 81 MG tablet   Oral   Take 1 tablet (81 mg  total) by mouth daily.         . Cholecalciferol (VITAMIN D PO)   Oral   Take 1 tablet by mouth daily.         [name] esomeprazole (NEXIUM) 40 MG capsule   Oral   Take 1 capsule (40 mg total) by mouth daily.   90 capsule   1   . ibuprofen (ADVIL,MOTRIN) 600 MG tablet   Oral   Take 1 tablet (600 mg total) by mouth 3 (three) times daily.   30 tablet   1   . meclizine (ANTIVERT) 25 MG tablet   Oral   Take 1 tablet (25 mg total) by mouth 3 (three) times daily as needed for dizziness or nausea.   30 tablet   1   . sucralfate (CARAFATE) 1 G tablet   Oral   Take 1 tablet (1 g total) by mouth 4 (four) times daily.   120 tablet   1   . triamterene-hydrochlorothiazide (MAXZIDE-25) 37.5-25  MG tablet   Oral   Take 1 tablet by mouth daily.   90 tablet   1   . valACYclovir (VALTREX) 1000 MG tablet   Oral   Take 1 tablet (1,000 mg total) by mouth 3 (three) times daily.   21 tablet   5   . venlafaxine XR (EFFEXOR XR) 37.5 MG 24 hr capsule   Oral   Take 1 capsule (37.5 mg total) by mouth daily with breakfast.   90 capsule   1       Allergies:  Review of patient's allergies indicates no known allergies.  Review of Systems: Gen:  Denies  fever, sweats, chills HEENT: Denies blurred vision, double vision. bleeds, sore throat Cvc:  No dizziness, chest pain. Resp:   Denies cough or sputum porduction,  Gi: Denies swallowing difficulty, stomach pain. Gu:  Denies bladder incontinence, burning urine Ext:   No Joint pain, stiffness. Skin: No skin rash,  hives Endoc:  No polyuria, polydipsia. Psych: No depression, insomnia. Other:  All other systems were reviewed with the patient and were negative other that what is mentioned in the HPI.   Physical Examination:   VS: BP 136/82 mmHg  Pulse 74  Ht 5\' 9"  (1.753 m)  Wt 240 lb (108.863 kg)  BMI 35.43 kg/m2  SpO2 98%  LMP 01/07/2010 (LMP Unknown)  General Appearance: No distress  Neuro:without focal findings,  speech normal,  HEENT: PERRLA, EOM intact.   Pulmonary: normal breath sounds, No wheezing.  CardiovascularNormal S1,S2.  No m/r/g.   Abdomen: Benign, Soft, non-tender. Renal:  No costovertebral tenderness  GU:  No performed at this time. Endoc: No evident thyromegaly, no signs of acromegaly. Skin:   warm, no rashes, no ecchymosis  Extremities: normal, no cyanosis, clubbing.  Other findings:    LABORATORY PANEL:   CBC No results for input(s): WBC, HGB, HCT, PLT in the last 168 hours. ------------------------------------------------------------------------------------------------------------------  Chemistries  No results for input(s): NA, K, CL, CO2, GLUCOSE, BUN, CREATININE, CALCIUM, MG, AST, ALT,  ALKPHOS, BILITOT in the last 168 hours.  Invalid input(s): GFRCGP ------------------------------------------------------------------------------------------------------------------  Cardiac Enzymes No results for input(s): TROPONINI in the last 168 hours. ------------------------------------------------------------  RADIOLOGY:  No results found.     Thank  you for the consultation and for allowing [facility] to assist in the care of your patient. Our recommendations are noted above.  Please contact us if we can be of further service.   Marda Stalker, MD.  Board  Certified in Internal Medicine, Pulmonary Medicine, [place], and Sleep Medicine.  [facility] Office Number: [phone]  Patricia Pesa, M.D.  Vilinda Boehringer, M.D.  Merton Border, M.D

## 2015-03-28 ENCOUNTER — Telehealth: Payer: Self-pay | Admitting: Family Medicine

## 2015-03-28 MED ORDER — TRIAMCINOLONE ACETONIDE 0.1 % EX CREA: 1.0000 "application " | TOPICAL_CREAM | Freq: Two times a day (BID) | CUTANEOUS | Status: DC

## 2015-03-28 MED ORDER — MECLIZINE HCL 25 MG PO TABS: 25.0000 mg | ORAL_TABLET | Freq: Three times a day (TID) | ORAL | Status: DC | PRN

## 2015-03-28 NOTE — Telephone Encounter (Signed)
Can you please help me with this... Pt was here 01/05/15

## 2015-03-28 NOTE — Telephone Encounter (Signed)
Rx was sent pt need to make an appt for further refills

## 2015-03-28 NOTE — Telephone Encounter (Signed)
Brianna Foster called saying she needs a refill of Meclizine and a cream she uses for Eczema. I told her she'll more than likely need to come in since she saw [place] but she works in [place] from 8a-5pm and is going out of town for the holidays. She said her Vertigo is "really acting up" and she needs her Rx. Please give her a phone call regarding this.  Pt's ph# [phone] Thank you.

## 2015-03-28 NOTE — Telephone Encounter (Signed)
It looks like Dr. Lenise Arena has prescribed her the Meclizine in the past. She should follow up with him regarding that.   Ok to send in refill for one month of Kenalog cream 1%.

## 2015-04-20 ENCOUNTER — Telehealth: Payer: Self-pay | Admitting: *Deleted

## 2015-04-20 NOTE — Telephone Encounter (Signed)
This patient has a strong family history of both esophageal and gastric cancer, and is overdue for colon cancer screening. I recommend both of these procedures if she is at all able to have them done. If she needs to reschedule and try later that is fine. Unsure how much of of the EGD is covered by her insurance but they should cover a screening colonoscopy if she at least wants to have that. Can you please let her know? Thanks

## 2015-04-20 NOTE — Telephone Encounter (Signed)
She is doing the colonoscopy but not the EGD.

## 2015-04-20 NOTE — Telephone Encounter (Signed)
Ok got it Thanks. 

## 2015-04-20 NOTE — Telephone Encounter (Signed)
Patient called and cancelled EGD that was scheduled due to cost.

## 2015-04-25 ENCOUNTER — Telehealth: Payer: Self-pay | Admitting: Gastroenterology

## 2015-04-25 MED ORDER — NA SULFATE-K SULFATE-MG SULF 17.5-3.13-1.6 GM/177ML PO SOLN: ORAL | Status: DC

## 2015-04-25 NOTE — Telephone Encounter (Signed)
Called pt and informed her that a prep will be up front for her to pick up. Pt states she will come by today or first thing tomorrow morning.

## 2015-04-25 NOTE — Telephone Encounter (Signed)
Sent!

## 2015-04-27 ENCOUNTER — Ambulatory Visit (AMBULATORY_SURGERY_CENTER): Payer: BLUE CROSS/BLUE SHIELD | Admitting: Gastroenterology

## 2015-04-27 ENCOUNTER — Encounter: Payer: Self-pay | Admitting: Gastroenterology

## 2015-04-27 VITALS — BP 163/83 | HR 65 | Temp 97.7°F | Resp 22 | Ht 69.0 in | Wt 183.0 lb

## 2015-04-27 DIAGNOSIS — Z1211 Encounter for screening for malignant neoplasm of colon: Secondary | ICD-10-CM

## 2015-04-27 DIAGNOSIS — D123 Benign neoplasm of transverse colon: Secondary | ICD-10-CM

## 2015-04-27 DIAGNOSIS — D128 Benign neoplasm of rectum: Secondary | ICD-10-CM

## 2015-04-27 DIAGNOSIS — D124 Benign neoplasm of descending colon: Secondary | ICD-10-CM | POA: Diagnosis not present

## 2015-04-27 MED ORDER — SODIUM CHLORIDE 0.9 % IV SOLN: 500.0000 mL | INTRAVENOUS | Status: DC

## 2015-04-27 NOTE — Progress Notes (Signed)
Called to room to assist during endoscopic procedure.  Patient ID and intended procedure confirmed with present staff. Received instructions for my participation in the procedure from the performing physician.  

## 2015-04-27 NOTE — Op Note (Addendum)
[facility]  [address] [place], [address]   COLONOSCOPY PROCEDURE REPORT  PATIENT: Brianna Foster, Brianna Foster  MR#: [id number] BIRTHDATE: 08/29/1955 , 31  yrs. old GENDER: female ENDOSCOPIST: Yetta Flock, MD REFERRED BY: PROCEDURE DATE:  04/27/2015 PROCEDURE:   Colonoscopy, screening, Colonoscopy, diagnostic, Colonoscopy with snare polypectomy, and Colonoscopy with biopsy First Screening Colonoscopy - Avg.  risk and is 50 yrs.  old or older - No.  Prior Negative Screening - Now for repeat screening. 10 or more years since last screening  History of Adenoma - Now for follow-up colonoscopy & has been > or = to 3 yrs.  N/A  Polyps removed today? Yes ASA CLASS:   Class II INDICATIONS:Evaluation of unexplained GI bleeding, Screening for colonic neoplasia, and Colorectal Neoplasm Risk Assessment for this procedure is average risk. MEDICATIONS: Propofol 300 mg IV  DESCRIPTION OF PROCEDURE:   After the risks benefits and alternatives of the procedure were thoroughly explained, informed consent was obtained.  The digital rectal exam revealed no abnormalities of the rectum.   The LB [id number]  endoscope was introduced through the anus and advanced to the terminal ileum which was intubated for a short distance. No adverse events experienced.   The quality of the prep was adequate  The instrument was then slowly withdrawn as the colon was fully examined. Estimated blood loss is zero unless otherwise noted in this procedure report.      COLON FINDINGS: A 50mm sessile polyp was noted in the hepatic flexure and removed via cold snare.  A 18mm sessile polyp was noted in the splenic flexure and removed via cold snare.  A 29mm sessile polyp was noted in the descending colon and removed via cold snare.  Four x 79mm sessile polyps were noted in the rectum and 3 removed via cold snare, one removed via cold forceps given the snare could not grasp it.  The remainder of  the examined colon was normal.  The ileum was normal.  Retroflexed views revealed internal hemorrhoids. The time to cecum = 2.9 Withdrawal time = 22.3   The scope was withdrawn and the procedure completed. COMPLICATIONS: There were no immediate complications.  ENDOSCOPIC IMPRESSION: 7 small polyps removed as outlined above Internal hemorrhoids, which are the likely cause for rectal bleeding  RECOMMENDATIONS: Await pathology results No NSAIDS for 2 weeks Resume diet Resume medications Daily fiber supplement Follow up in the clinic if further treatment of hemorrhoids as needed  eSigned:  Yetta Flock, MD 04/27/2015 3:45 PM Revised: 04/27/2015 3:45 PM  cc: the patient   PATIENT NAME:  Brianna, Foster MR#: [id number]

## 2015-04-27 NOTE — Progress Notes (Signed)
A/ox3, pleased with MAC, report to RN 

## 2015-04-27 NOTE — Patient Instructions (Addendum)
YOU HAD AN ENDOSCOPIC PROCEDURE TODAY AT [facility]:   Refer to the procedure report that was given to you for any specific questions about what was found during the examination.  If the procedure report does not answer your questions, please call your gastroenterologist to clarify.  If you requested that your care partner not be given the details of your procedure findings, then the procedure report has been included in a sealed envelope for you to review at your convenience later.  YOU SHOULD EXPECT: Some feelings of bloating in the abdomen. Passage of more gas than usual.  Walking can help get rid of the air that was put into your GI tract during the procedure and reduce the bloating. If you had a lower endoscopy (such as a colonoscopy or flexible sigmoidoscopy) you may notice spotting of blood in your stool or on the toilet paper. If you underwent a bowel prep for your procedure, you may not have a normal bowel movement for a few days.  Please Note:  You might notice some irritation and congestion in your nose or some drainage.  This is from the oxygen used during your procedure.  There is no need for concern and it should clear up in a day or so.  SYMPTOMS TO REPORT IMMEDIATELY:   Following lower endoscopy (colonoscopy or flexible sigmoidoscopy):  Excessive amounts of blood in the stool  Significant tenderness or worsening of abdominal pains  Swelling of the abdomen that is new, acute  Fever of 100F or higher   For urgent or emergent issues, a gastroenterologist can be reached at any hour by calling [phone].   DIET: Your first meal following the procedure should be a small meal and then it is ok to progress to your normal diet. Heavy or fried foods are harder to digest and may make you feel nauseous or bloated.  Likewise, meals heavy in dairy and vegetables can increase bloating.  Drink plenty of fluids but you should avoid alcoholic beverages for 24  hours.  ACTIVITY:  You should plan to take it easy for the rest of today and you should NOT DRIVE or use heavy machinery until tomorrow (because of the sedation medicines used during the test).    FOLLOW UP: Our staff will call the number listed on your records the next business day following your procedure to check on you and address any questions or concerns that you may have regarding the information given to you following your procedure. If we do not reach you, we will leave a message.  However, if you are feeling well and you are not experiencing any problems, there is no need to return our call.  We will assume that you have returned to your regular daily activities without incident.  If any biopsies were taken you will be contacted by phone or by letter within the next 1-3 weeks.  Please call us at [phone] if you have not heard about the biopsies in 3 weeks.    SIGNATURES/CONFIDENTIALITY: You and/or your care partner have signed paperwork which will be entered into your electronic medical record.  These signatures attest to the fact that that the information above on your After Visit Summary has been reviewed and is understood.  Full responsibility of the confidentiality of this discharge information lies with you and/or your care-partner.  Polyps/hemorrhoid handout Await pathology results Resume regular diet

## 2015-04-28 ENCOUNTER — Telehealth: Payer: Self-pay | Admitting: Emergency Medicine

## 2015-04-28 NOTE — Telephone Encounter (Signed)
  Follow up Call-  Call back number 04/27/2015  Post procedure Call Back phone  # [phone]  Permission to leave phone message Yes     Patient questions:  Do you have a fever, pain , or abdominal swelling? No. Pain Score  0 *  Have you tolerated food without any problems? Yes.    Have you been able to return to your normal activities? Yes.    Do you have any questions about your discharge instructions: Diet   No. Medications  No. Follow up visit  No.  Do you have questions or concerns about your Care? No.  Actions: * If pain score is 4 or above: No action needed, pain <4.

## 2015-05-04 ENCOUNTER — Encounter: Payer: Self-pay | Admitting: Gastroenterology

## 2015-05-20 ENCOUNTER — Telehealth: Payer: Self-pay | Admitting: *Deleted

## 2015-05-20 NOTE — Telephone Encounter (Signed)
Brianna Foster, can we go ahead and setup the sleep test for this pt? Thanks.

## 2015-05-20 NOTE — Telephone Encounter (Signed)
ATC patient and LMOAM for pt to return my call in reference to sleep study.  Waiting on return call from patient. Rhonda J Cobb

## 2015-05-20 NOTE — Telephone Encounter (Signed)
Pt calling stating she was not able to get the sleep study done due to insurance and now that it is taken care of would like to schedule that Please call back.

## 2015-05-23 ENCOUNTER — Ambulatory Visit: Payer: No Typology Code available for payment source | Admitting: Internal Medicine

## 2015-05-23 NOTE — Telephone Encounter (Signed)
ATC patient, no answer.  LMOAM for pt to return my call on my direct phone number [phone] in regards to her message on the sleep study. Rhonda J Cobb

## 2015-05-24 NOTE — Telephone Encounter (Signed)
Contacted and spoke with pt this evening at 4:52 pm.  Pt stated that she had a death in the family and was unable to give me insurance information at this time.  Pt stated that she would contact me back on Monday 05/30/15 after the funeral.  Explained to patient that all we needed to go forward with the study was a copy of her insurance card.  She stated that she had new insurance and would provide that for me on Monday.  All paperwork has been completed to fax to [facility].  Will close note and contact patient back on Monday if she doesn't contact me. Rhonda J Cobb

## 2015-05-24 NOTE — Telephone Encounter (Signed)
ATC pt this morning at 8:16 am, no answer.  LMOAM that we need pt's new insurance card in order to proceed with scheduling this sleep study.  [facility] will need the ins card before they will schedule this appointment. Advised patient that if she had access to a fax machine that she could fax this to our fax [phone] which is a secure fax.  Any questions, to please contact me at [phone].  Advised pt that this is my 3rd message to her. Rhonda J Cobb

## 2015-06-24 ENCOUNTER — Other Ambulatory Visit: Payer: Self-pay | Admitting: Family

## 2015-06-30 ENCOUNTER — Encounter: Payer: Self-pay | Admitting: *Deleted

## 2015-06-30 ENCOUNTER — Ambulatory Visit (INDEPENDENT_AMBULATORY_CARE_PROVIDER_SITE_OTHER): Payer: BLUE CROSS/BLUE SHIELD | Admitting: Adult Health

## 2015-06-30 ENCOUNTER — Encounter: Payer: Self-pay | Admitting: Adult Health

## 2015-06-30 VITALS — BP 120/84 | HR 73 | Temp 98.3°F | Ht 69.0 in | Wt 236.0 lb

## 2015-06-30 DIAGNOSIS — K529 Noninfective gastroenteritis and colitis, unspecified: Secondary | ICD-10-CM | POA: Diagnosis not present

## 2015-06-30 DIAGNOSIS — Z760 Encounter for issue of repeat prescription: Secondary | ICD-10-CM | POA: Diagnosis not present

## 2015-06-30 MED ORDER — ONDANSETRON HCL 4 MG PO TABS: 4.0000 mg | ORAL_TABLET | Freq: Three times a day (TID) | ORAL | Status: DC | PRN

## 2015-06-30 MED ORDER — AMLODIPINE BESYLATE 5 MG PO TABS: ORAL_TABLET | ORAL | Status: DC

## 2015-06-30 MED ORDER — TRIAMTERENE-HCTZ 37.5-25 MG PO TABS: 1.0000 | ORAL_TABLET | Freq: Every day | ORAL | Status: DC

## 2015-06-30 MED ORDER — MECLIZINE HCL 25 MG PO TABS: 25.0000 mg | ORAL_TABLET | Freq: Three times a day (TID) | ORAL | Status: DC | PRN

## 2015-06-30 NOTE — Progress Notes (Signed)
Subjective:    Patient ID: Brianna Foster, female    DOB: [birth year], 60 y.o.   MRN: [id number]  HPI  60 year old female who presents to the office today for GI issues. She complains of nausea,vomiting, and diarrhea with fever. Her symptoms started two days ago.   She reports that her symptoms have all resolved except for the nausea. She has been able to eat light meals without difficulty.   She reports that she will be transferring to [facility] due to it being closer to her home.   Review of Systems  Constitutional: Negative.   Respiratory: Negative.   Cardiovascular: Negative.   Gastrointestinal: Negative.   Musculoskeletal: Negative.   Neurological: Negative.   All other systems reviewed and are negative.  Past Medical History  Diagnosis Date  . Hypertension   . GERD (gastroesophageal reflux disease)   . Headache(784.0)     Social History   Social History  . Marital Status: Divorced    Spouse Name: N/A  . Number of Children: 1  . 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 Smoking status: Former Smoker    Types: Cigarettes    Quit date: 07/09/2014  . Smokeless tobacco: Never Used  . Alcohol Use: No     Comment: Occassional Use  . Drug Use: No  . Sexual Activity: Not Currently    Birth Control/ Protection: Post-menopausal   Other Topics Concern  . Not on file   Social History Narrative    Past Surgical History  Procedure Laterality Date  . Neck fusion    . Left heart catheterization with coronary angiogram N/A 05/11/2013    Procedure: LEFT HEART CATHETERIZATION WITH CORONARY ANGIOGRAM;  Surgeon: Burnell Blanks, MD;  Location: [facility] CATH LAB;  Service: Cardiovascular;  Laterality: N/A;    Family History  Problem Relation Age of Onset  . Heart disease Brother     Pacemaker  . Esophageal cancer Brother   . Stomach cancer Father     No Known Allergies  Current  Outpatient Prescriptions on File Prior to Visit  Medication Sig Dispense Refill  . aspirin EC 81 MG tablet Take 1 tablet (81 mg total) by mouth daily.    . Cholecalciferol (VITAMIN D PO) Take 1 tablet by mouth daily. Reported on 04/27/2015    . ibuprofen (ADVIL,MOTRIN) 600 MG tablet Take 1 tablet (600 mg total) by mouth 3 (three) times daily. 30 tablet 1  . omeprazole (PRILOSEC) 40 MG capsule Take 1 capsule (40 mg total) by mouth daily. 90 capsule 3  . sucralfate (CARAFATE) 1 G tablet Take 1 tablet (1 g total) by mouth 4 (four) times daily. 120 tablet 1  . triamcinolone cream (KENALOG) 0.1 % Apply 1 application topically 2 (two) times daily. 30 g 0  . valACYclovir (VALTREX) 1000 MG tablet Take 1 tablet (1,000 mg total) by mouth 3 (three) times daily. 21 tablet 5  . venlafaxine XR (EFFEXOR XR) 37.5 MG 24 hr capsule Take 1 capsule (37.5 mg total) by mouth daily with breakfast. 90 capsule 1   No current facility-administered medications on file prior to visit.    BP 120/84 mmHg  Pulse 73  Temp(Src) 98.3 F (36.8 C) (Oral)  Ht 5\' 9"  (1.753 m)  Wt 236 lb (107.049 kg)  BMI 34.84 kg/m2  LMP 01/07/2010 (LMP Unknown)       Objective:  Physical Exam  Constitutional: She is oriented to person, place, and time. She appears well-developed and well-nourished. No distress.  Cardiovascular: Normal rate, regular rhythm, normal heart sounds and intact distal pulses.  Exam reveals no gallop and no friction rub.   No murmur heard. Pulmonary/Chest: Effort normal. No respiratory distress. She has no wheezes. She has no rales. She exhibits no tenderness.  Abdominal: Soft. Bowel sounds are normal. She exhibits no distension and no mass. There is no tenderness. There is no rebound and no guarding.  Neurological: She is alert and oriented to person, place, and time.  Skin: Skin is warm and dry. No rash noted. No erythema. No pallor.  Psychiatric: She has a normal mood and affect. Her behavior is normal.  Judgment and thought content normal.  Nursing note and vitals reviewed.     Assessment & Plan:  1. Gastroenteritis - Bland diet for next 24-36 hours.  - ondansetron (ZOFRAN) 4 MG tablet; Take 1 tablet (4 mg total) by mouth every 8 (eight) hours as needed for nausea or vomiting.  Dispense: 20 tablet; Refill: 0 - Follow up as needed  2. Medication refill  - meclizine (ANTIVERT) 25 MG tablet; Take 1 tablet (25 mg total) by mouth 3 (three) times daily as needed for dizziness or nausea.  Dispense: 14 tablet; Refill: 1 - triamterene-hydrochlorothiazide (MAXZIDE-25) 37.5-25 MG tablet; Take 1 tablet by mouth daily.  Dispense: 90 tablet; Refill: 0 - amLODipine (NORVASC) 5 MG tablet; TAKE 1 TABLET (5 MG TOTAL) BY MOUTH DAILY.  Dispense: 90 tablet; Refill: 0 ]

## 2015-06-30 NOTE — Patient Instructions (Addendum)
It was great meeting you today!  Your exam is consistent with a stomach bug. I have sent in a prescription for Zofran to help with the nausea.  I have also refilled your medications.   Follow up with a new provider at [facility].   Address: [address], [place], [place] [address] Phone: [phone]    Viral Gastroenteritis Viral gastroenteritis is also known as stomach flu. This condition affects the stomach and intestinal tract. It can cause sudden diarrhea and vomiting. The illness typically lasts 3 to 8 days. Most people develop an immune response that eventually gets rid of the virus. While this natural response develops, the virus can make you quite ill. CAUSES  Many different viruses can cause gastroenteritis, such as rotavirus or noroviruses. You can catch one of these viruses by consuming contaminated food or water. You may also catch a virus by sharing utensils or other personal items with an infected person or by touching a contaminated surface. SYMPTOMS  The most common symptoms are diarrhea and vomiting. These problems can cause a severe loss of body fluids (dehydration) and a body salt (electrolyte) imbalance. Other symptoms may include:  Fever.  Headache.  Fatigue.  Abdominal pain. DIAGNOSIS  Your caregiver can usually diagnose viral gastroenteritis based on your symptoms and a physical exam. A stool sample may also be taken to test for the presence of viruses or other infections. TREATMENT  This illness typically goes away on its own. Treatments are aimed at rehydration. The most serious cases of viral gastroenteritis involve vomiting so severely that you are not able to keep fluids down. In these cases, fluids must be given through an intravenous line (IV). HOME CARE INSTRUCTIONS   Drink enough fluids to keep your urine clear or pale yellow. Drink small amounts of fluids frequently and increase the amounts as tolerated.  Ask your caregiver for specific  rehydration instructions.  Avoid:  Foods high in sugar.  Alcohol.  Carbonated drinks.  Tobacco.  Juice.  Caffeine drinks.  Extremely hot or cold fluids.  Fatty, greasy foods.  Too much intake of anything at one time.  Dairy products until 24 to 48 hours after diarrhea stops.  You may consume probiotics. Probiotics are active cultures of beneficial bacteria. They may lessen the amount and number of diarrheal stools in adults. Probiotics can be found in yogurt with active cultures and in supplements.  Wash your hands well to avoid spreading the virus.  Only take over-the-counter or prescription medicines for pain, discomfort, or fever as directed by your caregiver. Do not give aspirin to children. Antidiarrheal medicines are not recommended.  Ask your caregiver if you should continue to take your regular prescribed and over-the-counter medicines.  Keep all follow-up appointments as directed by your caregiver. SEEK IMMEDIATE MEDICAL CARE IF:   You are unable to keep fluids down.  You do not urinate at least once every 6 to 8 hours.  You develop shortness of breath.  You notice blood in your stool or vomit. This may look like coffee grounds.  You have abdominal pain that increases or is concentrated in one small area (localized).  You have persistent vomiting or diarrhea.  You have a fever.  The patient is a child younger than 3 months, and he or she has a fever.  The patient is a child older than 3 months, and he or she has a fever and persistent symptoms.  The patient is a child older than 3 months, and he or  she has a fever and symptoms suddenly get worse.  The patient is a baby, and he or she has no tears when crying. MAKE SURE YOU:   Understand these instructions.  Will watch your condition.  Will get help right away if you are not doing well or get worse.   This information is not intended to replace advice given to you by your health care provider.  Make sure you discuss any questions you have with your health care provider.   Document Released: 03/26/2005 Document Revised: 06/18/2011 Document Reviewed: 01/10/2011 Elsevier Interactive Patient Education [employer].

## 2015-06-30 NOTE — Progress Notes (Signed)
Pre visit review using our clinic review tool, if applicable. No additional management support is needed unless otherwise documented below in the visit note. 

## 2015-07-02 ENCOUNTER — Other Ambulatory Visit: Payer: Self-pay | Admitting: Family

## 2015-08-03 ENCOUNTER — Emergency Department: Payer: BLUE CROSS/BLUE SHIELD

## 2015-08-03 ENCOUNTER — Emergency Department
Admission: EM | Admit: 2015-08-03 | Discharge: 2015-08-03 | Disposition: A | Discharge status: Home / Self Care | Payer: BLUE CROSS/BLUE SHIELD | Attending: Emergency Medicine | Admitting: Emergency Medicine

## 2015-08-03 ENCOUNTER — Encounter: Payer: Self-pay | Admitting: Emergency Medicine

## 2015-08-03 DIAGNOSIS — Z87891 Personal history of nicotine dependence: Secondary | ICD-10-CM | POA: Insufficient documentation

## 2015-08-03 DIAGNOSIS — R197 Diarrhea, unspecified: Secondary | ICD-10-CM

## 2015-08-03 DIAGNOSIS — I10 Essential (primary) hypertension: Secondary | ICD-10-CM | POA: Insufficient documentation

## 2015-08-03 DIAGNOSIS — Z791 Long term (current) use of non-steroidal anti-inflammatories (NSAID): Secondary | ICD-10-CM | POA: Insufficient documentation

## 2015-08-03 DIAGNOSIS — R042 Hemoptysis: Secondary | ICD-10-CM

## 2015-08-03 DIAGNOSIS — Z7982 Long term (current) use of aspirin: Secondary | ICD-10-CM | POA: Diagnosis not present

## 2015-08-03 DIAGNOSIS — R112 Nausea with vomiting, unspecified: Secondary | ICD-10-CM | POA: Diagnosis present

## 2015-08-03 DIAGNOSIS — Z79899 Other long term (current) drug therapy: Secondary | ICD-10-CM | POA: Diagnosis not present

## 2015-08-03 LAB — LIPASE, BLOOD: Lipase: 33 U/L (ref 11–51)

## 2015-08-03 LAB — CBC
HEMATOCRIT: 41 % (ref 35.0–47.0)
Hemoglobin: 13.8 g/dL (ref 12.0–16.0)
MCH: 29.1 pg (ref 26.0–34.0)
MCHC: 33.7 g/dL (ref 32.0–36.0)
MCV: 86.5 fL (ref 80.0–100.0)
Platelets: 303 10*3/uL (ref 150–440)
RBC: 4.74 MIL/uL (ref 3.80–5.20)
RDW: 13.1 % (ref 11.5–14.5)
WBC: 10.4 10*3/uL (ref 3.6–11.0)

## 2015-08-03 LAB — COMPREHENSIVE METABOLIC PANEL
ALK PHOS: 74 U/L (ref 38–126)
ALT: 48 U/L (ref 14–54)
ANION GAP: 11 (ref 5–15)
AST: 40 U/L (ref 15–41)
Albumin: 4.4 g/dL (ref 3.5–5.0)
BILIRUBIN TOTAL: 0.4 mg/dL (ref 0.3–1.2)
BUN: 13 mg/dL (ref 6–20)
CALCIUM: 9.1 mg/dL (ref 8.9–10.3)
CO2: 22 mmol/L (ref 22–32)
Chloride: 108 mmol/L (ref 101–111)
Creatinine, Ser: 0.82 mg/dL (ref 0.44–1.00)
GFR calc Af Amer: >60 mL/min (ref >60)
Glucose, Bld: 106 mg/dL — ABNORMAL HIGH (ref 65–99)
POTASSIUM: 3.6 mmol/L (ref 3.5–5.1)
Sodium: 141 mmol/L (ref 135–145)
TOTAL PROTEIN: 8.1 g/dL (ref 6.5–8.1)

## 2015-08-03 MED ORDER — IOPAMIDOL (ISOVUE-370) INJECTION 76%
100.0000 mL | Freq: Once | INTRAVENOUS | Status: AC | PRN
  Administered 2015-08-03: 100 mL via INTRAVENOUS

## 2015-08-03 MED ORDER — SODIUM CHLORIDE 0.9 % IV BOLUS (SEPSIS)
1000.0000 mL | Freq: Once | INTRAVENOUS | Status: AC
  Administered 2015-08-03: 1000 mL via INTRAVENOUS

## 2015-08-03 MED ORDER — ONDANSETRON HCL 4 MG/2ML IJ SOLN
4.0000 mg | Freq: Once | INTRAMUSCULAR | Status: AC
  Administered 2015-08-03: 4 mg via INTRAVENOUS

## 2015-08-03 MED ORDER — ONDANSETRON HCL 4 MG/2ML IJ SOLN
INTRAMUSCULAR | Status: AC
  Administered 2015-08-03: 4 mg via INTRAVENOUS
  Filled 2015-08-03: qty 2

## 2015-08-03 NOTE — ED Notes (Signed)
Patient to ER for c/o vomiting and diarrhea last night. Reports vomiting again this am at approx 0500 with small amount of blood in it.

## 2015-08-03 NOTE — Discharge Instructions (Signed)
Please return for coughing up any more large amounts of blood especially due to 1-3 times. Please follow-up with your regular doctor. Please use Pepto-Bismol as needed for the vomiting and diarrhea. Return if she cannot keep any fluid down if he get a fever again very lightheaded or weak.

## 2015-08-03 NOTE — ED Provider Notes (Signed)
[facility] Emergency Department Provider Note  ____________________________________________  Time seen: Approximately 12:47 PM  I have reviewed the triage vital signs and the nursing notes.   HISTORY  Chief Complaint Hematemesis    HPI Brianna Foster is a 60 y.o. female patient reports yesterday she had pain in the left side of her chest under her breast. Last night she began having nausea vomiting and diarrhea. When she woke up this morning she had gone to the bathroom in bed while sleeping and had stool all over. She vomited at least twice this morning and then coughed up a course eyes lump of blood. She is currently not having any nausea vomiting or diarrhea and has not coughed anything blood up further. She has had a colonoscopy in the past which showed a lot of polyps.   Past Medical History  Diagnosis Date  . Hypertension   . GERD (gastroesophageal reflux disease)   . [id number])     Patient Active Problem List   Diagnosis Date Noted  . Chest pain 05/10/2013  . Tobacco abuse 07/23/2012  . Chest pain 06/18/2011  . Gastroesophageal reflux disease 06/18/2011  . Hypertension 06/18/2011  . No significant past medical history     Past Surgical History  Procedure Laterality Date  . Neck fusion    . Left heart catheterization with coronary angiogram N/A 05/11/2013    Procedure: LEFT HEART CATHETERIZATION WITH CORONARY ANGIOGRAM;  Surgeon: Burnell Blanks, MD;  Location: [facility] CATH LAB;  Service: Cardiovascular;  Laterality: N/A;    Current Outpatient Rx  Name  Route  Sig  Dispense  Refill  . amLODipine (NORVASC) 5 MG tablet      TAKE 1 TABLET (5 MG TOTAL) BY MOUTH DAILY.   90 tablet   0   . amLODipine (NORVASC) 5 MG tablet      TAKE 1 TABLET (5 MG TOTAL) BY MOUTH DAILY.   90 tablet   0     Pt need to establish with new provider for further ...   . aspirin EC 81 MG tablet   Oral   Take 1 tablet (81 mg total) by mouth daily.          . Cholecalciferol (VITAMIN D PO)   Oral   Take 1 tablet by mouth daily. Reported on 04/27/2015         . ibuprofen (ADVIL,MOTRIN) 600 MG tablet   Oral   Take 1 tablet (600 mg total) by mouth 3 (three) times daily.   30 tablet   1   . meclizine (ANTIVERT) 25 MG tablet   Oral   Take 1 tablet (25 mg total) by mouth 3 (three) times daily as needed for dizziness or nausea.   14 tablet   1   . omeprazole (PRILOSEC) 40 MG capsule   Oral   Take 1 capsule (40 mg total) by mouth daily.   90 capsule   3   . ondansetron (ZOFRAN) 4 MG tablet   Oral   Take 1 tablet (4 mg total) by mouth every 8 (eight) hours as needed for nausea or vomiting.   20 tablet   0   . ondansetron (ZOFRAN) 4 MG tablet   Oral   Take 1 tablet (4 mg total) by mouth every 8 (eight) hours as needed for nausea or vomiting.   20 tablet   0   . sucralfate (CARAFATE) 1 G tablet   Oral   Take 1 tablet (1 g total)  by mouth 4 (four) times daily.   120 tablet   1   . triamcinolone cream (KENALOG) 0.1 %   Topical   Apply 1 application topically 2 (two) times daily.   30 g   0   . triamterene-hydrochlorothiazide (MAXZIDE-25) 37.5-25 MG tablet   Oral   Take 1 tablet by mouth daily.   90 tablet   0     Pt need to establish with a new provider for furth ...   . valACYclovir (VALTREX) 1000 MG tablet   Oral   Take 1 tablet (1,000 mg total) by mouth 3 (three) times daily.   21 tablet   5   . venlafaxine XR (EFFEXOR XR) 37.5 MG 24 hr capsule   Oral   Take 1 capsule (37.5 mg total) by mouth daily with breakfast.   90 capsule   1     Allergies Review of patient's allergies indicates no known allergies.  Family History  Problem Relation Age of Onset  . Heart disease Brother     Pacemaker  . Esophageal cancer Brother   . Stomach cancer Father     Social History Social History  Substance Use Topics  . Smoking status: Former Smoker    Types: Cigarettes    Quit date: 07/09/2014  .  Smokeless tobacco: Never Used  . Alcohol Use: No     Comment: Occassional Use    Review of Systems Constitutional: No fever/chills Eyes: No visual changes. ENT: No sore throat. Cardiovascular: DeniesAny further chest pain. Respiratory: Denies shortness of breath. Gastrointestinal: See history of present illness Genitourinary: Negative for dysuria. Musculoskeletal: Negative for back pain. Skin: Negative for rash. Neurological: Negative for headaches, focal weakness or numbness.  10-point ROS otherwise negative.  ____________________________________________   PHYSICAL EXAM:  VITAL SIGNS: ED Triage Vitals  Enc Vitals Group     BP 08/03/15 1105 137/80 mmHg     Pulse Rate 08/03/15 1105 93     Resp 08/03/15 1105 18     Temp 08/03/15 1105 98.6 F (37 C)     Temp Source 08/03/15 1105 Oral     SpO2 08/03/15 1105 100 %     Weight 08/03/15 1105 235 lb (106.595 kg)     Height 08/03/15 1105 5\' 10"  (1.778 m)     Head Cir --      Peak Flow --      Pain Score 08/03/15 1106 6     Pain Loc --      Pain Edu? --      Excl. in [place]? --     Constitutional: Alert and oriented. Well appearing and in no acute distress. Eyes: Conjunctivae are normal. PERRL. EOMI. Head: Atraumatic. Nose: No congestion/rhinnorhea. Mouth/Throat: Mucous membranes are moist.  Oropharynx non-erythematous. Neck: No stridor.  Cardiovascular: Normal rate, regular rhythm. Grossly normal heart sounds.  Good peripheral circulation. Respiratory: Normal respiratory effort.  No retractions. Lungs CTAB. Gastrointestinal: Soft Mildly diffusely tender No distention. No abdominal bruits. No CVA tenderness. Musculoskeletal: No lower extremity tenderness nor edema.  No joint effusions. Neurologic:  Normal speech and language. No gross focal neurologic deficits are appreciated.  Skin:  Skin is warm, dry and intact. No rash noted. Psychiatric: Mood and affect are normal. Speech and behavior are  normal.  ____________________________________________   LABS (all labs ordered are listed, but only abnormal results are displayed)  Labs Reviewed  COMPREHENSIVE METABOLIC PANEL - Abnormal; Notable for the following:    Glucose, Bld 106 (*)  All other components within normal limits  GASTROINTESTINAL PANEL BY PCR, STOOL (REPLACES STOOL CULTURE)  LIPASE, BLOOD  CBC   ____________________________________________  EKG   ____________________________________________  RADIOLOGY  CT of the chest shows no acute pathology. There is some what appears be atelectasis aces. ____________________________________________   PROCEDURES   ____________________________________________   INITIAL IMPRESSION / ASSESSMENT AND PLAN / ED COURSE  Pertinent labs & imaging results that were available during my care of the patient were reviewed by me and considered in my medical decision making (see chart for details).  Patient feels better after fluids has no further nausea vomiting or diarrhea. Says no further hemoptysis ____________________________________________   FINAL CLINICAL IMPRESSION(S) / ED DIAGNOSES  Final diagnoses:  Diarrhea, unspecified type  Hemoptysis      Nena Polio, MD 08/03/15 1520

## 2015-08-05 ENCOUNTER — Ambulatory Visit (INDEPENDENT_AMBULATORY_CARE_PROVIDER_SITE_OTHER): Payer: BLUE CROSS/BLUE SHIELD | Admitting: Adult Health

## 2015-08-05 ENCOUNTER — Encounter: Payer: Self-pay | Admitting: *Deleted

## 2015-08-05 ENCOUNTER — Encounter: Payer: Self-pay | Admitting: Adult Health

## 2015-08-05 VITALS — BP 140/90 | HR 87 | Temp 98.2°F | Ht 70.0 in | Wt 232.9 lb

## 2015-08-05 DIAGNOSIS — K2100 Gastro-esophageal reflux disease with esophagitis, without bleeding: Secondary | ICD-10-CM

## 2015-08-05 DIAGNOSIS — R197 Diarrhea, unspecified: Secondary | ICD-10-CM

## 2015-08-05 DIAGNOSIS — R309 Painful micturition, unspecified: Secondary | ICD-10-CM | POA: Diagnosis not present

## 2015-08-05 LAB — POCT URINALYSIS DIPSTICK
Ketones, UA: 5
Spec Grav, UA: 1.02
UROBILINOGEN UA: 0.2
pH, UA: 6

## 2015-08-05 MED ORDER — DIPHENOXYLATE-ATROPINE 2.5-0.025 MG PO TABS: 2.0000 | ORAL_TABLET | Freq: Four times a day (QID) | ORAL | Status: DC | PRN

## 2015-08-05 MED ORDER — PANTOPRAZOLE SODIUM 40 MG PO TBEC: 40.0000 mg | DELAYED_RELEASE_TABLET | Freq: Every day | ORAL | Status: DC

## 2015-08-05 NOTE — Progress Notes (Signed)
Subjective:    Patient ID: Brianna Foster, female    DOB: November 12, 1955, 60 y.o.   MRN: [id number]  HPI  60 year old female, who presents to the office today for ER follow up. She was seen in [facility] on 08/03/2015. Per ER note: she had pain in the left side of her chest under her breast. Last night she began having nausea vomiting and diarrhea. When she woke up this morning she had gone to the bathroom in bed while sleeping and had stool all over.   CT of the chest shows no acute pathology. There is some what appears be atelectasis  She felt better after IV fluids and was discharged home  Today she reports that she continues to have diarrhea and she is unable to eat anything because she is going to vomit.   She denies having any fevers, nausea, or vomiting. She has not had antibiotics recently.   She has been taking Pepto for the last three days without relief.   Since being released from the hospital she has started to have dysuria. She denies any other UTI type symptoms.    Review of Systems  Constitutional: Negative.   Respiratory: Negative.   Gastrointestinal: Positive for diarrhea. Negative for nausea, vomiting, constipation and blood in stool.  Genitourinary: Positive for dysuria. Negative for frequency, hematuria and flank pain.   Past Medical History  Diagnosis Date  . Hypertension   . GERD (gastroesophageal reflux disease)   . Headache(784.0)     Social History   Social History  . Marital Status: Divorced    Spouse Name: N/A  . Number of Children: 1  . 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 Smoking status: Former Smoker    Types: Cigarettes    Quit date: 07/09/2014  . Smokeless tobacco: Never Used  . Alcohol Use: No     Comment: Occassional Use  . Drug Use: No  . Sexual Activity: Not Currently    Birth Control/ Protection: Post-menopausal   Other Topics Concern  . Not on file    Social History Narrative    Past Surgical History  Procedure Laterality Date  . Neck fusion    . Left heart catheterization with coronary angiogram N/A 05/11/2013    Procedure: LEFT HEART CATHETERIZATION WITH CORONARY ANGIOGRAM;  Surgeon: Burnell Blanks, MD;  Location: [facility] CATH LAB;  Service: Cardiovascular;  Laterality: N/A;    Family History  Problem Relation Age of Onset  . Heart disease Brother     Pacemaker  . Esophageal cancer Brother   . Stomach cancer Father     No Known Allergies  Current Outpatient Prescriptions on File Prior to Visit  Medication Sig Dispense Refill  . amLODipine (NORVASC) 5 MG tablet TAKE 1 TABLET (5 MG TOTAL) BY MOUTH DAILY. 90 tablet 0  . amLODipine (NORVASC) 5 MG tablet TAKE 1 TABLET (5 MG TOTAL) BY MOUTH DAILY. 90 tablet 0  . aspirin EC 81 MG tablet Take 1 tablet (81 mg total) by mouth daily.    . Cholecalciferol (VITAMIN D PO) Take 1 tablet by mouth daily. Reported on 04/27/2015    . ibuprofen (ADVIL,MOTRIN) 600 MG tablet Take 1 tablet (600 mg total) by mouth 3 (three) times daily. 30 tablet 1  . meclizine (ANTIVERT) 25 MG tablet Take 1 tablet (25 mg total) by mouth 3 (three) times daily as needed  for dizziness or nausea. 14 tablet 1  . omeprazole (PRILOSEC) 40 MG capsule Take 1 capsule (40 mg total) by mouth daily. 90 capsule 3  . ondansetron (ZOFRAN) 4 MG tablet Take 1 tablet (4 mg total) by mouth every 8 (eight) hours as needed for nausea or vomiting. 20 tablet 0  . ondansetron (ZOFRAN) 4 MG tablet Take 1 tablet (4 mg total) by mouth every 8 (eight) hours as needed for nausea or vomiting. 20 tablet 0  . sucralfate (CARAFATE) 1 G tablet Take 1 tablet (1 g total) by mouth 4 (four) times daily. 120 tablet 1  . triamcinolone cream (KENALOG) 0.1 % Apply 1 application topically 2 (two) times daily. 30 g 0  . triamterene-hydrochlorothiazide (MAXZIDE-25) 37.5-25 MG tablet Take 1 tablet by mouth daily. 90 tablet 0  . valACYclovir (VALTREX) 1000 MG  tablet Take 1 tablet (1,000 mg total) by mouth 3 (three) times daily. 21 tablet 5  . venlafaxine XR (EFFEXOR XR) 37.5 MG 24 hr capsule Take 1 capsule (37.5 mg total) by mouth daily with breakfast. 90 capsule 1   No current facility-administered medications on file prior to visit.    BP 140/90 mmHg  Pulse 87  Temp(Src) 98.2 F (36.8 C) (Oral)  Ht 5\' 10"  (1.778 m)  Wt 232 lb 14.4 oz (105.643 kg)  BMI 33.42 kg/m2  SpO2 97%  LMP 01/07/2010 (LMP Unknown)       Objective:   Physical Exam  Constitutional: She is oriented to person, place, and time. She appears well-developed and well-nourished. No distress.  Cardiovascular: Normal rate, regular rhythm, normal heart sounds and intact distal pulses.  Exam reveals no gallop and no friction rub.   No murmur heard. Pulmonary/Chest: Effort normal and breath sounds normal. No respiratory distress. She has no wheezes. She has no rales. She exhibits no tenderness.  Abdominal: Soft. Bowel sounds are normal. There is tenderness in the epigastric area, periumbilical area, suprapubic area and left lower quadrant. There is no rigidity, no guarding and no CVA tenderness.  Neurological: She is alert and oriented to person, place, and time.  Skin: Skin is warm and dry. No rash noted. She is not diaphoretic. No erythema. No pallor.  Psychiatric: She has a normal mood and affect. Her behavior is normal. Judgment and thought content normal.  Vitals reviewed.     Assessment & Plan:  1. Diarrhea, unspecified type - Likely GI bug - Stool culture - C. difficile, PCR - diphenoxylate-atropine (LOMOTIL) 2.5-0.025 MG tablet; Take 2 tablets by mouth 4 (four) times daily as needed for diarrhea or loose stools.  Dispense: 30 tablet; Refill: 1 - Bland diet - Follow up if no improvement in the next 2-3 days.  2. Painful urination - POCT urinalysis dipstick- Negative  3. Gastroesophageal reflux disease with esophagitis - d/c omeprazole - pantoprazole (PROTONIX)  40 MG tablet; Take 1 tablet (40 mg total) by mouth daily.  Dispense: 30 tablet; Refill: 3   Dorothyann Peng, NP

## 2015-08-05 NOTE — Patient Instructions (Addendum)
It was great seeing you again and I am sorry you are going through this.   Stop taking omeprazole and start taking protonix for your acid reflux.   Use the Lomotil as needed for diarrhea.   Bland diet for the weekend.   Follow up with me on Monday if no improvement.   Food Choices to Help Relieve Diarrhea, Adult When you have diarrhea, the foods you eat and your eating habits are very important. Choosing the right foods and drinks can help relieve diarrhea. Also, because diarrhea can last up to 7 days, you need to replace lost fluids and electrolytes (such as sodium, potassium, and chloride) in order to help prevent dehydration.  WHAT GENERAL GUIDELINES DO I NEED TO FOLLOW?  Slowly drink 1 cup (8 oz) of fluid for each episode of diarrhea. If you are getting enough fluid, your urine will be clear or pale yellow.  Eat starchy foods. Some good choices include white rice, white toast, pasta, low-fiber cereal, baked potatoes (without the skin), saltine crackers, and bagels.  Avoid large servings of any cooked vegetables.  Limit fruit to two servings per day. A serving is  cup or 1 small piece.  Choose foods with less than 2 g of fiber per serving.  Limit fats to less than 8 tsp (38 g) per day.  Avoid fried foods.  Eat foods that have probiotics in them. Probiotics can be found in certain dairy products.  Avoid foods and beverages that may increase the speed at which food moves through the stomach and intestines (gastrointestinal tract). Things to avoid include:  High-fiber foods, such as dried fruit, raw fruits and vegetables, nuts, seeds, and whole grain foods.  Spicy foods and high-fat foods.  Foods and beverages sweetened with high-fructose corn syrup, honey, or sugar alcohols such as xylitol, sorbitol, and mannitol. WHAT FOODS ARE RECOMMENDED? Grains White rice. White, [place], or pita breads (fresh or toasted), including plain rolls, buns, or bagels. White pasta. Saltine, soda,  or graham crackers. Pretzels. Low-fiber cereal. Cooked cereals made with water (such as cornmeal, farina, or cream cereals). Plain muffins. Matzo. Melba toast. Zwieback.  Vegetables Potatoes (without the skin). Strained tomato and vegetable juices. Most well-cooked and canned vegetables without seeds. Tender lettuce. Fruits Cooked or canned applesauce, apricots, cherries, fruit cocktail, grapefruit, peaches, pears, or plums. Fresh bananas, apples without skin, cherries, grapes, cantaloupe, grapefruit, peaches, oranges, or plums.  Meat and Other Protein Products Baked or boiled chicken. Eggs. Tofu. Fish. Seafood. Smooth peanut butter. Ground or well-cooked tender beef, ham, veal, lamb, pork, or poultry.  Dairy Plain yogurt, kefir, and unsweetened liquid yogurt. Lactose-free milk, buttermilk, or soy milk. Plain hard cheese. Beverages Sport drinks. Clear broths. Diluted fruit juices (except prune). Regular, caffeine-free sodas such as ginger ale. Water. Decaffeinated teas. Oral rehydration solutions. Sugar-free beverages not sweetened with sugar alcohols. Other Bouillon, broth, or soups made from recommended foods.  The items listed above may not be a complete list of recommended foods or beverages. Contact your dietitian for more options. WHAT FOODS ARE NOT RECOMMENDED? Grains Whole grain, whole wheat, bran, or rye breads, rolls, pastas, crackers, and cereals. Wild or brown rice. Cereals that contain more than 2 g of fiber per serving. Corn tortillas or taco shells. Cooked or dry oatmeal. Granola. Popcorn. Vegetables Raw vegetables. Cabbage, broccoli, Brussels sprouts, artichokes, baked beans, beet greens, corn, kale, legumes, peas, sweet potatoes, and yams. Potato skins. Cooked spinach and cabbage. Fruits Dried fruit, including raisins and dates. Raw fruits. Stewed  or dried prunes. Fresh apples with skin, apricots, mangoes, pears, raspberries, and strawberries.  Meat and Other Protein  Products Chunky peanut butter. Nuts and seeds. Beans and lentils. Berniece Salines.  Dairy High-fat cheeses. Milk, chocolate milk, and beverages made with milk, such as milk shakes. Cream. Ice cream. Sweets and Desserts Sweet rolls, doughnuts, and sweet breads. Pancakes and waffles. Fats and Oils Butter. Cream sauces. Margarine. Salad oils. Plain salad dressings. Olives. Avocados.  Beverages Caffeinated beverages (such as coffee, tea, soda, or energy drinks). Alcoholic beverages. Fruit juices with pulp. Prune juice. Soft drinks sweetened with high-fructose corn syrup or sugar alcohols. Other Coconut. Hot sauce. Chili powder. Mayonnaise. Gravy. Cream-based or milk-based soups.  The items listed above may not be a complete list of foods and beverages to avoid. Contact your dietitian for more information. WHAT SHOULD I DO IF I BECOME DEHYDRATED? Diarrhea can sometimes lead to dehydration. Signs of dehydration include dark urine and dry mouth and skin. If you think you are dehydrated, you should rehydrate with an oral rehydration solution. These solutions can be purchased at pharmacies, retail stores, or online.  Drink -1 cup (120-240 mL) of oral rehydration solution each time you have an episode of diarrhea. If drinking this amount makes your diarrhea worse, try drinking smaller amounts more often. For example, drink 1-3 tsp (5-15 mL) every 5-10 minutes.  A general rule for staying hydrated is to drink 1-2 L of fluid per day. Talk to your health care provider about the specific amount you should be drinking each day. Drink enough fluids to keep your urine clear or pale yellow.   This information is not intended to replace advice given to you by your health care provider. Make sure you discuss any questions you have with your health care provider.   Document Released: 06/16/2003 Document Revised: 04/16/2014 Document Reviewed: 02/16/2013 Elsevier Interactive Patient Education [employer].

## 2015-08-05 NOTE — Progress Notes (Signed)
Pre visit review using our clinic review tool, if applicable. No additional management support is needed unless otherwise documented below in the visit note. 

## 2015-09-20 ENCOUNTER — Ambulatory Visit (INDEPENDENT_AMBULATORY_CARE_PROVIDER_SITE_OTHER): Payer: BLUE CROSS/BLUE SHIELD | Admitting: Family

## 2015-09-20 ENCOUNTER — Encounter: Payer: Self-pay | Admitting: Family

## 2015-09-20 VITALS — BP 120/80 | HR 83 | Temp 98.1°F | Ht 68.5 in | Wt 241.2 lb

## 2015-09-20 DIAGNOSIS — Z760 Encounter for issue of repeat prescription: Secondary | ICD-10-CM

## 2015-09-20 DIAGNOSIS — H6980 Other specified disorders of Eustachian tube, unspecified ear: Secondary | ICD-10-CM | POA: Diagnosis not present

## 2015-09-20 DIAGNOSIS — R42 Dizziness and giddiness: Secondary | ICD-10-CM | POA: Diagnosis not present

## 2015-09-20 MED ORDER — PREDNISONE 20 MG PO TABS
40.0000 mg | ORAL_TABLET | Freq: Every day | ORAL | Status: DC
Start: 2015-09-20 — End: 2015-10-28

## 2015-09-20 MED ORDER — MECLIZINE HCL 25 MG PO TABS: 25.0000 mg | ORAL_TABLET | Freq: Three times a day (TID) | ORAL | Status: DC | PRN

## 2015-09-20 NOTE — Progress Notes (Signed)
Subjective:    Patient ID: Brianna Foster, female    DOB: [birth year], 60 y.o.   MRN: [id number]   Brianna Foster is a 60 y.o. female who presents today for an acute visit.    HPI Comments: Patient here for evaluation of vertigo for 6 days, comes and goes. Room spinning. Worse when laying down or turning head. Has to sit up to resolve vertigo.Resolved with meclizine and sinus decongestant OTC. Suffered from vertigo for several years. Has been worked up by ENT. Vertigo back today. Endorses some nausea which since resolved. Headache around eyes and pressure in sinuses. Right ear tinnitis pressure. Sees occasional flashing lights in bilateral eyes. Last eye exam 2-3 years ago. No syncope, hearing loss.  Patient also endorses occasional dizziness, lightheadedness while sitting at work. Episodes occur once a month and are self-limiting. She has no prodromal symptoms or had any syncopal episodes. Patient is not diabetic. Patient is on norvasc and maxzide.   Denies exertional chest pain or pressure, numbness or tingling radiating to left arm or jaw, palpitations, changes in vision, or shortness of breath.    Past Medical History  Diagnosis Date  . Hypertension   . GERD (gastroesophageal reflux disease)   . Headache(784.0)    Allergies: Review of patient's allergies indicates no known allergies. Current Outpatient Prescriptions on File Prior to Visit  Medication Sig Dispense Refill  . amLODipine (NORVASC) 5 MG tablet TAKE 1 TABLET (5 MG TOTAL) BY MOUTH DAILY. 90 tablet 0  . amLODipine (NORVASC) 5 MG tablet TAKE 1 TABLET (5 MG TOTAL) BY MOUTH DAILY. 90 tablet 0  . aspirin EC 81 MG tablet Take 1 tablet (81 mg total) by mouth daily.    . Cholecalciferol (VITAMIN D PO) Take 1 tablet by mouth daily. Reported on 04/27/2015    . diphenoxylate-atropine (LOMOTIL) 2.5-0.025 MG tablet Take 2 tablets by mouth 4 (four) times daily as needed for diarrhea or loose stools. 30 tablet 1  . ibuprofen  (ADVIL,MOTRIN) 600 MG tablet Take 1 tablet (600 mg total) by mouth 3 (three) times daily. 30 tablet 1  . meclizine (ANTIVERT) 25 MG tablet Take 1 tablet (25 mg total) by mouth 3 (three) times daily as needed for dizziness or nausea. 14 tablet 1  . ondansetron (ZOFRAN) 4 MG tablet Take 1 tablet (4 mg total) by mouth every 8 (eight) hours as needed for nausea or vomiting. 20 tablet 0  . ondansetron (ZOFRAN) 4 MG tablet Take 1 tablet (4 mg total) by mouth every 8 (eight) hours as needed for nausea or vomiting. 20 tablet 0  . pantoprazole (PROTONIX) 40 MG tablet Take 1 tablet (40 mg total) by mouth daily. 30 tablet 3  . sucralfate (CARAFATE) 1 G tablet Take 1 tablet (1 g total) by mouth 4 (four) times daily. 120 tablet 1  . triamcinolone cream (KENALOG) 0.1 % Apply 1 application topically 2 (two) times daily. 30 g 0  . triamterene-hydrochlorothiazide (MAXZIDE-25) 37.5-25 MG tablet Take 1 tablet by mouth daily. 90 tablet 0  . valACYclovir (VALTREX) 1000 MG tablet Take 1 tablet (1,000 mg total) by mouth 3 (three) times daily. 21 tablet 5  . venlafaxine XR (EFFEXOR XR) 37.5 MG 24 hr capsule Take 1 capsule (37.5 mg total) by mouth daily with breakfast. 90 capsule 1   No current facility-administered medications on file prior to visit.    Social History  Substance Use Topics  . Smoking status: Former Smoker    Types: Cigarettes  Quit date: 07/09/2014  . Smokeless tobacco: Never Used  . Alcohol Use: No     Comment: Occassional Use    Review of Systems  Constitutional: Negative for fever and chills.  HENT: Positive for congestion, ear pain, postnasal drip and sinus pressure. Negative for sore throat.   Eyes: Positive for visual disturbance.  Respiratory: Negative for cough, shortness of breath and wheezing.   Cardiovascular: Negative for chest pain and palpitations.  Gastrointestinal: Negative for nausea and vomiting.  Neurological: Positive for dizziness, light-headedness and headaches.        Objective:    BP 120/80 mmHg  Pulse 83  Temp(Src) 98.1 F (36.7 C)  Ht 5' 8.5" (1.74 m)  Wt 241 lb 3.2 oz (109.408 kg)  BMI 36.14 kg/m2  SpO2 96%  LMP 01/07/2010 (LMP Unknown)   Physical Exam  Constitutional: She appears well-developed and well-nourished.  HENT:  Head: Normocephalic and atraumatic.  Right Ear: Hearing, tympanic membrane, external ear and ear canal normal. No swelling or tenderness. Tympanic membrane is not erythematous and not bulging. No middle ear effusion.  Left Ear: Tympanic membrane, external ear and ear canal normal. No swelling or tenderness. Tympanic membrane is not erythematous and not bulging.  No middle ear effusion.  Nose: Nose normal. No rhinorrhea. Right sinus exhibits no maxillary sinus tenderness and no frontal sinus tenderness. Left sinus exhibits no maxillary sinus tenderness and no frontal sinus tenderness.  Mouth/Throat: Uvula is midline, oropharynx is clear and moist and mucous membranes are normal. No posterior oropharyngeal edema or posterior oropharyngeal erythema.  Eyes: Conjunctivae, EOM and lids are normal. Pupils are equal, round, and reactive to light. Lids are everted and swept, no foreign bodies found.  Normal fundus bilaterally.  Cardiovascular: Normal rate, regular rhythm, normal heart sounds and normal pulses.   No murmur heard. Unable to lay patient supine due to vertigo to do orthostatic blood pressure measurements.  Pulmonary/Chest: Effort normal and breath sounds normal. She has no wheezes. She has no rhonchi. She has no rales.  Lymphadenopathy:       Head (right side): No submental, no submandibular, no tonsillar, no preauricular, no posterior auricular and no occipital adenopathy present.       Head (left side): No submental, no submandibular, no tonsillar, no preauricular, no posterior auricular and no occipital adenopathy present.    She has no cervical adenopathy.       Right cervical: No superficial cervical, no deep  cervical and no posterior cervical adenopathy present.      Left cervical: No superficial cervical, no deep cervical and no posterior cervical adenopathy present.  Neurological: She is alert. She has normal strength. No cranial nerve deficit or sensory deficit. She displays a negative Romberg sign.  Reflex Scores:      Bicep reflexes are 2+ on the right side and 2+ on the left side.      Patellar reflexes are 2+ on the right side and 2+ on the left side. Grip equal and strong bilateral upper extremities. Gait strong and steady. Able to perform  finger-to-nose without difficulty.  Unable to lie patient supine to perform Dixpike maneuver she states this will cause a severe episode of vertigo.  Skin: Skin is warm and dry.  Psychiatric: She has a normal mood and affect. Her speech is normal and behavior is normal. Thought content normal.  Vitals reviewed.      Assessment & Plan:  1. Vertigo Working diagnosis of BPPV. I'm reassured by normal neurologic exam. Vertigo  is worsening. Likely exacerbated by eustachian tube dysfunction. However patient is experiencing changes in vision and dizziness; we jointly decided on imaging at this time.  - MR Brain W Wo Contrast; Future  2. Eustachian tube dysfunction, unspecified laterality  - predniSONE (DELTASONE) 20 MG tablet; Take 2 tablets (40 mg total) by mouth daily.  Dispense: 10 tablet; Refill: 0  3. Medication refill Agreed to refill ; patient plans to reestablish with PCP in [facility] or [facility] office for continued maintenance.  - meclizine (ANTIVERT) 25 MG tablet; Take 1 tablet (25 mg total) by mouth 3 (three) times daily as needed for dizziness or nausea.  Dispense: 14 tablet; Refill: 1     I am having Ms. Denne maintain her Cholecalciferol (VITAMIN D PO), aspirin EC, ibuprofen, sucralfate, valACYclovir, venlafaxine XR, triamcinolone cream, ondansetron, ondansetron, meclizine, triamterene-hydrochlorothiazide, amLODipine, amLODipine,  diphenoxylate-atropine, and pantoprazole.   No orders of the defined types were placed in this encounter.     Start medications as prescribed and explained to patient on After Visit Summary ( AVS). Risks, benefits, and alternatives of the medications and treatment plan prescribed today were discussed, and patient expressed understanding.   Education regarding symptom management and diagnosis given to patient.   Follow-up:Plan follow-up and return precautions given if any worsening symptoms or change in condition.   Continue to follow with No primary care provider on file. for routine health maintenance.   [name] and I agreed with plan.   Mable Paris, FNP

## 2015-09-20 NOTE — Patient Instructions (Signed)
MRI  Prednisone trial.   If there is no improvement in your symptoms, or if there is any worsening of symptoms, or if you have any additional concerns, please return for re-evaluation; or, if we are closed, consider going to the Emergency Room for evaluation if symptoms urgent.  Benign Positional Vertigo Vertigo is the feeling that you or your surroundings are moving when they are not. Benign positional vertigo is the most common form of vertigo. The cause of this condition is not serious (is benign). This condition is triggered by certain movements and positions (is positional). This condition can be dangerous if it occurs while you are doing something that could endanger you or others, such as driving.  CAUSES In many cases, the cause of this condition is not known. It may be caused by a disturbance in an area of the inner ear that helps your brain to sense movement and balance. This disturbance can be caused by a viral infection (labyrinthitis), head injury, or repetitive motion. RISK FACTORS This condition is more likely to develop in:  Women.  People who are 54 years of age or older. SYMPTOMS Symptoms of this condition usually happen when you move your head or your eyes in different directions. Symptoms may start suddenly, and they usually last for less than a minute. Symptoms may include:  Loss of balance and falling.  Feeling like you are spinning or moving.  Feeling like your surroundings are spinning or moving.  Nausea and vomiting.  Blurred vision.  Dizziness.  Involuntary eye movement (nystagmus). Symptoms can be mild and cause only slight annoyance, or they can be severe and interfere with daily life. Episodes of benign positional vertigo may return (recur) over time, and they may be triggered by certain movements. Symptoms may improve over time. DIAGNOSIS This condition is usually diagnosed by medical history and a physical exam of the head, neck, and ears. You may be  referred to a health care provider who specializes in ear, nose, and throat (ENT) problems (otolaryngologist) or a provider who specializes in disorders of the nervous system (neurologist). You may have additional testing, including:  MRI.  A CT scan.  Eye movement tests. Your health care provider may ask you to change positions quickly while he or she watches you for symptoms of benign positional vertigo, such as nystagmus. Eye movement may be tested with an electronystagmogram (ENG), caloric stimulation, the Dix-Hallpike test, or the roll test.  An electroencephalogram (EEG). This records electrical activity in your brain.  Hearing tests. TREATMENT Usually, your health care provider will treat this by moving your head in specific positions to adjust your inner ear back to normal. Surgery may be needed in severe cases, but this is rare. In some cases, benign positional vertigo may resolve on its own in 2-4 weeks. HOME CARE INSTRUCTIONS Safety  Move slowly.Avoid sudden body or head movements.  Avoid driving.  Avoid operating heavy machinery.  Avoid doing any tasks that would be dangerous to you or others if a vertigo episode would occur.  If you have trouble walking or keeping your balance, try using a cane for stability. If you feel dizzy or unstable, sit down right away.  Return to your normal activities as told by your health care provider. Ask your health care provider what activities are safe for you. General Instructions  Take over-the-counter and prescription medicines only as told by your health care provider.  Avoid certain positions or movements as told by your health care provider.  Drink enough fluid to keep your urine clear or pale yellow.  Keep all follow-up visits as told by your health care provider. This is important. SEEK MEDICAL CARE IF:  You have a fever.  Your condition gets worse or you develop new symptoms.  Your family or friends notice any  behavioral changes.  Your nausea or vomiting gets worse.  You have numbness or a "pins and needles" sensation. SEEK IMMEDIATE MEDICAL CARE IF:  You have difficulty speaking or moving.  You are always dizzy.  You faint.  You develop severe headaches.  You have weakness in your legs or arms.  You have changes in your hearing or vision.  You develop a stiff neck.  You develop sensitivity to light.   This information is not intended to replace advice given to you by your health care provider. Make sure you discuss any questions you have with your health care provider.   Document Released: 01/01/2006 Document Revised: 12/15/2014 Document Reviewed: 07/19/2014 Elsevier Interactive Patient Education [employer].

## 2015-09-21 ENCOUNTER — Telehealth: Payer: Self-pay | Admitting: *Deleted

## 2015-09-21 NOTE — Telephone Encounter (Signed)
Patient walked-in the office asking for a note for work that she was seen yesterday and she did not work today.  After advisement from Dr Caryl Bis patient was asked if there were any new symptoms.  Patient states that nothing was new but that she felt dizzy this morning and did not want to drive to work.  She feels as though the medication is working now and she should be able to return to work tomorrow 09/22/15. Note provided.

## 2015-09-21 NOTE — Telephone Encounter (Signed)
Patient has requested to have her work not updated for yesterday and today's date. She could not drive to work today,due to her vertigo Pt contact [phone] Please call when not is ready

## 2015-09-24 ENCOUNTER — Other Ambulatory Visit: Payer: Self-pay | Admitting: Adult Health

## 2015-09-27 NOTE — Telephone Encounter (Signed)
Refill sent to pharmacy.   

## 2015-09-27 NOTE — Telephone Encounter (Signed)
30 days with 0 refills. She needs to establish with myself or another provider. There will be no additional refills until that is done.

## 2015-10-04 ENCOUNTER — Other Ambulatory Visit: Payer: BLUE CROSS/BLUE SHIELD

## 2015-10-05 ENCOUNTER — Other Ambulatory Visit: Payer: Self-pay | Admitting: Adult Health

## 2015-10-05 NOTE — Telephone Encounter (Signed)
I cannot refill her medication. She needs to establish with a new provider as I have asked her multiple times in the past  In her last note it is documented that she is going to find a new provider at [facility] or [facility]

## 2015-10-05 NOTE — Telephone Encounter (Signed)
Patient has not yet established with new PCP - ok to refill?

## 2015-10-14 ENCOUNTER — Ambulatory Visit
Admission: RE | Admit: 2015-10-14 | Discharge: 2015-10-14 | Disposition: A | Discharge status: Home / Self Care | Payer: BLUE CROSS/BLUE SHIELD | Source: Ambulatory Visit | Attending: Family | Admitting: Family

## 2015-10-14 ENCOUNTER — Ambulatory Visit (INDEPENDENT_AMBULATORY_CARE_PROVIDER_SITE_OTHER): Payer: BLUE CROSS/BLUE SHIELD | Admitting: Family

## 2015-10-14 VITALS — BP 110/80 | HR 71 | Temp 97.9°F | Ht 69.0 in | Wt 238.0 lb

## 2015-10-14 DIAGNOSIS — R42 Dizziness and giddiness: Secondary | ICD-10-CM

## 2015-10-14 DIAGNOSIS — R2689 Other abnormalities of gait and mobility: Secondary | ICD-10-CM

## 2015-10-14 DIAGNOSIS — R29818 Other symptoms and signs involving the nervous system: Secondary | ICD-10-CM

## 2015-10-14 MED ORDER — GADOBENATE DIMEGLUMINE 529 MG/ML IV SOLN
20.0000 mL | Freq: Once | INTRAVENOUS | Status: AC | PRN
  Administered 2015-10-14: 20 mL via INTRAVENOUS

## 2015-10-14 NOTE — Progress Notes (Signed)
Subjective:    Patient ID: Brianna Foster, female    DOB: Sep 25, 1955, 60 y.o.   MRN: [id number]   Brianna Foster is a 60 y.o. female who presents today for an acute visit.    HPI Comments: Patient here for evaluation of trouble with balance and fluid in right ear which started again yesterday. Today slightly better. States no vertigo right now, mostly concern for balance. Endorses sinus pressure over right maxillary.  No falls. No fever, chills, nausea, vomiting, vision changes, HA, dizziness, chest pain, numbness, tingling in legs.   Had MRI Head today.  Seen in urgent care 5 days ago for vertigo and given cefdinir ( 4 days left), prednisone, and claritin, however symptoms arent much better. Took her meclizine with some relief.   Past Medical History  Diagnosis Date  . Hypertension   . GERD (gastroesophageal reflux disease)   . Headache(784.0)    Allergies: Review of patient's allergies indicates no known allergies. Current Outpatient Prescriptions on File Prior to Visit  Medication Sig Dispense Refill  . amLODipine (NORVASC) 5 MG tablet TAKE 1 TABLET (5 MG TOTAL) BY MOUTH DAILY. 90 tablet 0  . amLODipine (NORVASC) 5 MG tablet TAKE 1 TABLET (5 MG TOTAL) BY MOUTH DAILY. 30 tablet 0  . aspirin EC 81 MG tablet Take 1 tablet (81 mg total) by mouth daily.    . Cholecalciferol (VITAMIN D PO) Take 1 tablet by mouth daily. Reported on 04/27/2015    . diphenoxylate-atropine (LOMOTIL) 2.5-0.025 MG tablet Take 2 tablets by mouth 4 (four) times daily as needed for diarrhea or loose stools. 30 tablet 1  . ibuprofen (ADVIL,MOTRIN) 600 MG tablet Take 1 tablet (600 mg total) by mouth 3 (three) times daily. 30 tablet 1  . meclizine (ANTIVERT) 25 MG tablet Take 1 tablet (25 mg total) by mouth 3 (three) times daily as needed for dizziness or nausea. 14 tablet 1  . ondansetron (ZOFRAN) 4 MG tablet Take 1 tablet (4 mg total) by mouth every 8 (eight) hours as needed for nausea or vomiting. 20 tablet  0  . ondansetron (ZOFRAN) 4 MG tablet Take 1 tablet (4 mg total) by mouth every 8 (eight) hours as needed for nausea or vomiting. 20 tablet 0  . pantoprazole (PROTONIX) 40 MG tablet Take 1 tablet (40 mg total) by mouth daily. 30 tablet 3  . predniSONE (DELTASONE) 20 MG tablet Take 2 tablets (40 mg total) by mouth daily. 10 tablet 0  . sucralfate (CARAFATE) 1 G tablet Take 1 tablet (1 g total) by mouth 4 (four) times daily. 120 tablet 1  . triamcinolone cream (KENALOG) 0.1 % Apply 1 application topically 2 (two) times daily. 30 g 0  . triamterene-hydrochlorothiazide (MAXZIDE-25) 37.5-25 MG tablet TAKE 1 TABLET BY MOUTH DAILY. 30 tablet 0  . valACYclovir (VALTREX) 1000 MG tablet Take 1 tablet (1,000 mg total) by mouth 3 (three) times daily. 21 tablet 5  . venlafaxine XR (EFFEXOR XR) 37.5 MG 24 hr capsule Take 1 capsule (37.5 mg total) by mouth daily with breakfast. 90 capsule 1   No current facility-administered medications on file prior to visit.    Social History  Substance Use Topics  . Smoking status: Former Smoker    Types: Cigarettes    Quit date: 07/09/2014  . Smokeless tobacco: Never Used  . Alcohol Use: No     Comment: Occassional Use    Review of Systems  Constitutional: Negative for fever and chills.  HENT: Positive for  ear pain (pressure right). Negative for congestion and sinus pressure.   Respiratory: Negative for cough.   Cardiovascular: Negative for chest pain and palpitations.  Gastrointestinal: Negative for nausea and vomiting.  Neurological: Negative for dizziness, numbness and headaches.      Objective:    BP 110/80 mmHg  Pulse 71  Temp(Src) 97.9 F (36.6 C)  Ht 5\' 9"  (1.753 m)  Wt 238 lb (107.956 kg)  BMI 35.13 kg/m2  SpO2 97%  LMP 01/07/2010 (LMP Unknown)   Physical Exam  Constitutional: She appears well-developed and well-nourished.  HENT:  Head: Normocephalic and atraumatic.  Right Ear: Hearing, tympanic membrane, external ear and ear canal  normal. No swelling or tenderness. Tympanic membrane is not erythematous and not bulging. No middle ear effusion.  Left Ear: Tympanic membrane, external ear and ear canal normal. No swelling or tenderness. Tympanic membrane is not erythematous and not bulging.  No middle ear effusion.  Nose: Nose normal. No rhinorrhea. Right sinus exhibits no maxillary sinus tenderness and no frontal sinus tenderness. Left sinus exhibits no maxillary sinus tenderness and no frontal sinus tenderness.  Mouth/Throat: Uvula is midline, oropharynx is clear and moist and mucous membranes are normal. No posterior oropharyngeal edema or posterior oropharyngeal erythema.  Eyes: Conjunctivae, EOM and lids are normal. Pupils are equal, round, and reactive to light. Lids are everted and swept, no foreign bodies found.  Normal fundus bilaterally  Cardiovascular: Normal rate, regular rhythm, normal heart sounds and normal pulses.   Pulmonary/Chest: Effort normal and breath sounds normal. She has no wheezes. She has no rhonchi. She has no rales.  Lymphadenopathy:       Head (right side): No submental, no submandibular, no tonsillar, no preauricular, no posterior auricular and no occipital adenopathy present.       Head (left side): No submental, no submandibular, no tonsillar, no preauricular, no posterior auricular and no occipital adenopathy present.    She has no cervical adenopathy.       Right cervical: No superficial cervical, no deep cervical and no posterior cervical adenopathy present.      Left cervical: No superficial cervical, no deep cervical and no posterior cervical adenopathy present.  Neurological: She is alert. She has normal strength. No cranial nerve deficit or sensory deficit. She displays a negative Romberg sign.  Reflex Scores:      Bicep reflexes are 2+ on the right side and 2+ on the left side.      Patellar reflexes are 2+ on the right side and 2+ on the left side. Grip equal and strong bilateral upper  extremities. Gait strong and steady. Able to perform  finger-to-nose without difficulty.  Able to stand on one leg without difficulty.  Skin: Skin is warm and dry.  Psychiatric: She has a normal mood and affect. Her speech is normal and behavior is normal. Thought content normal.  Vitals reviewed.      Assessment & Plan:  1. Balance problems Reassured by normal neurologic exam. Also reassured by MRI head which I reviewed with patient in the room today. MRI head shows nonspecific findings for white matter changes moderately advanced for her age. Considerations for this include migraine and high blood pressure. It also showed right maxillary inflammation which correlates with symptoms.She has failed conservative treatment for vertigo including Flonase, Claritin, prednisone, antibiotics. She is also on a diuretic and blood pressure medication. At this time,I prefer a second opinion re especially in the context of her bowel issues that have made a  referral to neurology. In the interim the patient will trial over-the-counter Sudafed to see if this relieves the pressure in the right ear.  - Ambulatory referral to Neurology     I am having Ms. Juliane Lack maintain her Cholecalciferol (VITAMIN D PO), aspirin EC, ibuprofen, sucralfate, valACYclovir, venlafaxine XR, triamcinolone cream, ondansetron, ondansetron, amLODipine, diphenoxylate-atropine, pantoprazole, predniSONE, meclizine, amLODipine, triamterene-hydrochlorothiazide, cefdinir, and loratadine.   Meds ordered this encounter  Medications  . cefdinir (OMNICEF) 300 MG capsule    Sig: Take 300 mg by mouth 2 (two) times daily.    Refill:  0  . loratadine (CLARITIN) 10 MG tablet    Sig: Take 10 mg by mouth daily.    Return precautions given.   Start medications as prescribed and explained to patient on After Visit Summary ( AVS). Risks, benefits, and alternatives of the medications and treatment plan prescribed today were discussed, and patient  expressed understanding.   Education regarding symptom management and diagnosis given to patient.   Follow-up:Plan follow-up and return precautions given if any worsening symptoms or change in condition.   Continue to follow with Coral Spikes, DO for routine health maintenance.   [name] and I agreed with plan.   Mable Paris, FNP

## 2015-10-14 NOTE — Patient Instructions (Signed)
Referral to neurology.   As we discussed,trial of over-the-counter Sudafed, however cautiously due to hypertension,see if this helps relieve pressure and right ear.  If there is no improvement in your symptoms, or if there is any worsening of symptoms, or if you have any additional concerns, please return for re-evaluation; or, if we are closed, consider going to the Emergency Room for evaluation if symptoms urgent.

## 2015-10-28 ENCOUNTER — Ambulatory Visit (INDEPENDENT_AMBULATORY_CARE_PROVIDER_SITE_OTHER): Payer: BLUE CROSS/BLUE SHIELD | Admitting: Neurology

## 2015-10-28 ENCOUNTER — Encounter: Payer: Self-pay | Admitting: Neurology

## 2015-10-28 VITALS — BP 138/80 | HR 68 | Ht 69.0 in | Wt 242.5 lb

## 2015-10-28 DIAGNOSIS — R42 Dizziness and giddiness: Secondary | ICD-10-CM | POA: Insufficient documentation

## 2015-10-28 DIAGNOSIS — G479 Sleep disorder, unspecified: Secondary | ICD-10-CM | POA: Diagnosis not present

## 2015-10-28 DIAGNOSIS — R930 Abnormal findings on diagnostic imaging of skull and head, not elsewhere classified: Secondary | ICD-10-CM

## 2015-10-28 DIAGNOSIS — R519 Headache, unspecified: Secondary | ICD-10-CM | POA: Insufficient documentation

## 2015-10-28 DIAGNOSIS — G441 Vascular headache, not elsewhere classified: Secondary | ICD-10-CM | POA: Diagnosis not present

## 2015-10-28 DIAGNOSIS — R51 Headache: Secondary | ICD-10-CM

## 2015-10-28 MED ORDER — NORTRIPTYLINE HCL 10 MG PO CAPS: ORAL_CAPSULE | ORAL | Status: DC

## 2015-10-28 NOTE — Patient Instructions (Addendum)
   Pamelor (nortriptyline) is an antidepressant medication that has many uses that may include headache, whiplash injuries, or for peripheral neuropathy pain. Side effects may include drowsiness, dry mouth, blurred vision, or constipation. As with any antidepressant medication, worsening depression may occur. If you had any significant side effects, please call our office. The full effects of this medication may take 7-10 days after starting the drug, or going up on the dose.  General Headache Without Cause A headache is pain or discomfort felt around the head or neck area. The specific cause of a headache may not be found. There are many causes and types of headaches. A few common ones are:  Tension headaches.  Migraine headaches.  Cluster headaches.  Chronic daily headaches. HOME CARE INSTRUCTIONS  Watch your condition for any changes. Take these steps to help with your condition: Managing Pain  Take over-the-counter and prescription medicines only as told by your health care provider.  Lie down in a dark, quiet room when you have a headache.  If directed, apply ice to the head and neck area:  Put ice in a plastic bag.  Place a towel between your skin and the bag.  Leave the ice on for 20 minutes, 2-3 times per day.  Use a heating pad or hot shower to apply heat to the head and neck area as told by your health care provider.  Keep lights dim if bright lights bother you or make your headaches worse. Eating and Drinking  Eat meals on a regular schedule.  Limit alcohol use.  Decrease the amount of caffeine you drink, or stop drinking caffeine. General Instructions  Keep all follow-up visits as told by your health care provider. This is important.  Keep a headache journal to help find out what may trigger your headaches. For example, write down:  What you eat and drink.  How much sleep you get.  Any change to your diet or medicines.  Try massage or other relaxation  techniques.  Limit stress.  Sit up straight, and do not tense your muscles.  Do not use tobacco products, including cigarettes, chewing tobacco, or e-cigarettes. If you need help quitting, ask your health care provider.  Exercise regularly as told by your health care provider.  Sleep on a regular schedule. Get 7-9 hours of sleep, or the amount recommended by your health care provider. SEEK MEDICAL CARE IF:   Your symptoms are not helped by medicine.  You have a headache that is different from the usual headache.  You have nausea or you vomit.  You have a fever. SEEK IMMEDIATE MEDICAL CARE IF:   Your headache becomes severe.  You have repeated vomiting.  You have a stiff neck.  You have a loss of vision.  You have problems with speech.  You have pain in the eye or ear.  You have muscular weakness or loss of muscle control.  You lose your balance or have trouble walking.  You feel faint or pass out.  You have confusion.   This information is not intended to replace advice given to you by your health care provider. Make sure you discuss any questions you have with your health care provider.   Document Released: 03/26/2005 Document Revised: 12/15/2014 Document Reviewed: 07/19/2014 Elsevier Interactive Patient Education [employer].

## 2015-10-28 NOTE — Progress Notes (Signed)
Reason for visit: Vertigo, headache  Referring physician: Dr. Etheleen Nicks is a 60 y.o. female  History of present illness:  Brianna Foster is a 60 year old right-handed black female with a history of episodic vertigo off and on over the last 15 years. The patient had onset of her usual vertigo the second week in June 2017. The episode however has lasted longer than usual. Typically, she will get the vertigo when she lies down, or rolls to the right or to her left side. The patient has had some mild gait instability also associated with the vertigo. The patient has not had any blackout episodes, she denies any focal numbness or weakness of the face, arms, or legs. The patient initially was told that she had a right ear infection and a sinus infection. She was placed on antibiotic therapy. MRI of the brain was set up, this showed some mild chronic right maxillary sinus issues, but the patient also was noted to have a moderate amount of white matter changes. The patient reports a history of hypertension, she has been on blood pressure medication since she was 60 years old. The patient has had improvement of vertigo over the last couple weeks, but she has developed daily headaches that are now bothersome to her. The headaches are bifrontal and temporal in nature, and are often present upon awakening, they may get better temporarily, but then come on later in the day. The patient may see spots in front of the eyes, she denies any nausea or vomiting with the headache, she denies any numbness or weakness. The patient may have difficulty with concentration during the headache. She reports no double vision, slurred speech, or difficulty swallowing. She denies any issues controlling the bowels or the bladder. She denies any pre-existing history of headache. She is sent to this office for an evaluation.  Past Medical History  Diagnosis Date  . Hypertension   . GERD (gastroesophageal reflux  disease)   . [id number])     Past Surgical History  Procedure Laterality Date  . Neck fusion    . Left heart catheterization with coronary angiogram N/A 05/11/2013    Procedure: LEFT HEART CATHETERIZATION WITH CORONARY ANGIOGRAM;  Surgeon: Burnell Blanks, MD;  Location: [facility] CATH LAB;  Service: Cardiovascular;  Laterality: N/A;    Family History  Problem Relation Age of Onset  . Heart disease Brother     Pacemaker  . Esophageal cancer Brother   . Stomach cancer Father   . Breast cancer Sister   . Lung cancer Mother     Social history:  reports that she quit smoking about 15 months ago. Her smoking use included Cigarettes. She has never used smokeless tobacco. She reports that she does not drink alcohol or use illicit drugs.  Medications:  Prior to Admission medications   Medication Sig Start Date End Date Taking? Authorizing Provider  amLODipine (NORVASC) 5 MG tablet TAKE 1 TABLET (5 MG TOTAL) BY MOUTH DAILY. 09/27/15  Yes Brianna Peng, NP  aspirin EC 81 MG tablet Take 1 tablet (81 mg total) by mouth daily. 05/12/13  Yes Erline Hau, MD  butalbital-acetaminophen-caffeine (FIORICET, [facility]) [phone] MG tablet  10/19/15  Yes Historical Provider, MD  cefdinir (OMNICEF) 300 MG capsule Take 300 mg by mouth 2 (two) times daily. 10/09/15  Yes Historical Provider, MD  Cholecalciferol (VITAMIN D PO) Take 1 tablet by mouth daily. Reported on 04/27/2015   Yes Historical Provider, MD  diphenoxylate-atropine (LOMOTIL) 2.5-0.025  MG tablet Take 2 tablets by mouth 4 (four) times daily as needed for diarrhea or loose stools. 08/05/15  Yes Brianna Peng, NP  fluticasone Asencion Islam) 50 MCG/ACT nasal spray  09/20/15  Yes Historical Provider, MD  ibuprofen (ADVIL,MOTRIN) 600 MG tablet Take 1 tablet (600 mg total) by mouth 3 (three) times daily. 09/02/13  Yes Kennyth Arnold, FNP  loratadine (CLARITIN) 10 MG tablet Take 10 mg by mouth daily.   Yes Historical Provider, MD  meclizine (ANTIVERT)  25 MG tablet Take 1 tablet (25 mg total) by mouth 3 (three) times daily as needed for dizziness or nausea. 09/20/15 09/19/16 Yes Burnard Hawthorne, FNP  ondansetron (ZOFRAN) 4 MG tablet Take 1 tablet (4 mg total) by mouth every 8 (eight) hours as needed for nausea or vomiting. 06/30/15  Yes Brianna Peng, NP  pantoprazole (PROTONIX) 40 MG tablet Take 1 tablet (40 mg total) by mouth daily. 08/05/15  Yes Brianna Peng, NP  sucralfate (CARAFATE) 1 G tablet Take 1 tablet (1 g total) by mouth 4 (four) times daily. 12/28/14  Yes Carrie Mew, MD  triamcinolone cream (KENALOG) 0.1 % Apply 1 application topically 2 (two) times daily. 03/28/15  Yes Brianna Peng, NP  triamterene-hydrochlorothiazide (MAXZIDE-25) 37.5-25 MG tablet TAKE 1 TABLET BY MOUTH DAILY. 09/27/15  Yes Brianna Peng, NP  valACYclovir (VALTREX) 1000 MG tablet Take 1 tablet (1,000 mg total) by mouth 3 (three) times daily. 01/05/15  Yes Kennyth Arnold, FNP  venlafaxine XR (EFFEXOR XR) 37.5 MG 24 hr capsule Take 1 capsule (37.5 mg total) by mouth daily with breakfast. 01/05/15  Yes Kennyth Arnold, FNP     No Known Allergies  ROS:  Out of a complete 14 system review of symptoms, the patient complains only of the following symptoms, and all other reviewed systems are negative.  Fatigue Ringing in the ears, right side Memory loss, headache, dizziness Snoring Decreased energy Allergies  Blood pressure 138/80, pulse 68, height 5\' 9"  (1.753 m), weight 242 lb 8 oz (109.997 kg), last menstrual period 01/07/2010.  Physical Exam  General: The patient is alert and cooperative at the time of the examination. The patient is moderately to markedly obese.  Eyes: Pupils are equal, round, and reactive to light. Discs are flat bilaterally. Good venous pulsations are seen bilaterally.  Ears: Tympanic membranes were clear bilaterally.  Neck: The neck is supple, no carotid bruits are noted.  Respiratory: The respiratory examination is  clear.  Cardiovascular: The cardiovascular examination reveals a regular rate and rhythm, no obvious murmurs or rubs are noted.  Skin: Extremities are without significant edema.  Neurologic Exam  Mental status: The patient is alert and oriented x 3 at the time of the examination. The patient has apparent normal recent and remote memory, with an apparently normal attention span and concentration ability.  Cranial nerves: Facial symmetry is present. There is good sensation of the face to pinprick and soft touch bilaterally. The strength of the facial muscles and the muscles to head turning and shoulder shrug are normal bilaterally. Speech is well enunciated, no aphasia or dysarthria is noted. Extraocular movements are full. Visual fields are full. The tongue is midline, and the patient has symmetric elevation of the soft palate. No obvious hearing deficits are noted.  Motor: The motor testing reveals 5 over 5 strength of all 4 extremities. Good symmetric motor tone is noted throughout.  Sensory: Sensory testing is intact to pinprick, soft touch, vibration sensation, and position sense on all 4 extremities.  No evidence of extinction is noted.  Coordination: Cerebellar testing reveals good finger-nose-finger and heel-to-shin bilaterally. The Nyan-Barrany procedure was done, this was unremarkable.  Gait and station: Gait is normal. Tandem gait is normal. Romberg is negative. No drift is seen.  Reflexes: Deep tendon reflexes are symmetric and normal bilaterally. Toes are downgoing bilaterally.   MRI brain 10/14/15:  IMPRESSION: 1. No acute intracranial abnormality or mass. 2. 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 Considerations include chronic small vessel ischemia, hypercoagulable state, vasculitis, migraines, prior infection/inflammation, or demyelination.  * MRI scan images were reviewed online. I agree with the written  report.    Assessment/Plan:  1. Episodic vertigo, positional  2. Abnormal MRI brain  3. Headache  4. Excessive daytime drowsiness  The patient reports issues with excessive daytime drowsiness, fatigue, waking up frequently at night with snoring. The patient will be referred for sleep evaluation. The patient has a long-standing history of hypertension, MRI evaluation does show bihemispheric white matter changes. The patient was sent for blood work, she is to get on low-dose aspirin. We will follow up with the MRI brain study over time, consider rechecking a study in 6-12 months. The patient will be placed on nortriptyline for the headache, she will follow-up in 3-4 months.   Jill Alexanders MD 10/28/2015 3:44 PM  [facility] [address] [place], [place] [address]  Phone [phone] Fax [phone]

## 2015-10-31 ENCOUNTER — Telehealth: Payer: Self-pay | Admitting: Family Medicine

## 2015-10-31 ENCOUNTER — Ambulatory Visit: Payer: BLUE CROSS/BLUE SHIELD | Admitting: Family Medicine

## 2015-10-31 NOTE — Telephone Encounter (Signed)
Test patient, worked. thanks

## 2015-10-31 NOTE — Telephone Encounter (Signed)
FYI, Pt called about not able to make appt due to being admitted in the hospital and just went back to work last Monday and could not take off today. Pt will call back to resch appt for NP. Appt is still on the sch. Thank you!

## 2015-10-31 NOTE — Telephone Encounter (Signed)
May remove, thanks

## 2015-10-31 NOTE — Telephone Encounter (Signed)
Ok. Thank you appt cancelled.

## 2015-10-31 NOTE — Telephone Encounter (Signed)
That is fine.  [place]

## 2015-10-31 NOTE — Telephone Encounter (Signed)
Test

## 2015-10-31 NOTE — Telephone Encounter (Signed)
Can re removed, or charge, thanks

## 2015-11-02 ENCOUNTER — Other Ambulatory Visit: Payer: Self-pay | Admitting: Adult Health

## 2015-11-02 ENCOUNTER — Telehealth: Payer: Self-pay | Admitting: Neurology

## 2015-11-02 LAB — ENA+DNA/DS+SJORGEN'S
ENA RNP AB: 0.4 AI (ref 0.0–0.9)
ENA SM Ab Ser-aCnc: <0.2 AI (ref 0.0–0.9)
ENA SSA (RO) Ab: <0.2 AI (ref 0.0–0.9)

## 2015-11-02 LAB — PAN-ANCA
ANCA Proteinase 3: <3.5 U/mL (ref 0.0–3.5)
Atypical pANCA: <1:20 {titer}
Myeloperoxidase Ab: <9 U/mL (ref 0.0–9.0)
P-ANCA: <1:20 {titer}

## 2015-11-02 LAB — ANGIOTENSIN CONVERTING ENZYME: ANGIO CONVERT ENZYME: 77 U/L (ref 14–82)

## 2015-11-02 LAB — B. BURGDORFI ANTIBODIES

## 2015-11-02 LAB — SEDIMENTATION RATE: SED RATE: 33 mm/h (ref 0–40)

## 2015-11-02 LAB — RPR: RPR: NONREACTIVE

## 2015-11-02 LAB — HIV ANTIBODY (ROUTINE TESTING W REFLEX): HIV Screen 4th Generation wRfx: NONREACTIVE

## 2015-11-02 LAB — ANA W/REFLEX: Anti Nuclear Antibody(ANA): POSITIVE — AB

## 2015-11-02 NOTE — Telephone Encounter (Signed)
I called patient. Blood work was unremarkable with exception of a positive ANA. The antibody panel however was unremarkable, this is likely to not be of any clinical significance. I discussed this with the patient. The patient is doing better with her headaches with the nortriptyline. We will continue to go up on the dosing.

## 2015-11-14 ENCOUNTER — Institutional Professional Consult (permissible substitution): Payer: BLUE CROSS/BLUE SHIELD | Admitting: Neurology

## 2015-11-17 ENCOUNTER — Telehealth: Payer: Self-pay | Admitting: Neurology

## 2015-11-17 NOTE — Telephone Encounter (Signed)
Called pt and offered 12:00 work-in appt for tomorrow (11/18/15), 11:45 arrival time. May call back to schedule.

## 2015-11-17 NOTE — Telephone Encounter (Signed)
Pt called back, appt scheduled.

## 2015-11-17 NOTE — Telephone Encounter (Signed)
Pt called in requesting an appt. Tomorrow. She says that her right ear feels full with ringing. Also with a headache. Please call and advise [phone]

## 2015-11-18 ENCOUNTER — Ambulatory Visit: Payer: Self-pay | Admitting: Neurology

## 2015-11-18 ENCOUNTER — Ambulatory Visit: Payer: BLUE CROSS/BLUE SHIELD | Admitting: Family Medicine

## 2015-11-18 DIAGNOSIS — Z0289 Encounter for other administrative examinations: Secondary | ICD-10-CM

## 2015-11-22 ENCOUNTER — Encounter: Payer: Self-pay | Admitting: Neurology

## 2015-11-22 ENCOUNTER — Institutional Professional Consult (permissible substitution): Payer: BLUE CROSS/BLUE SHIELD | Admitting: Neurology

## 2015-11-23 ENCOUNTER — Other Ambulatory Visit: Payer: Self-pay | Admitting: Adult Health

## 2015-11-23 DIAGNOSIS — Z760 Encounter for issue of repeat prescription: Secondary | ICD-10-CM

## 2015-11-23 NOTE — Telephone Encounter (Signed)
Dr.Cook patient  

## 2015-11-24 NOTE — Telephone Encounter (Signed)
Refilled on 09/25/15. Patient last seen by Arnett 10/14/15.  Was unable to make appointment with Dr.Cook on 10/31/15. Please advise?

## 2015-11-30 ENCOUNTER — Institutional Professional Consult (permissible substitution): Payer: BLUE CROSS/BLUE SHIELD | Admitting: Neurology

## 2015-12-10 ENCOUNTER — Other Ambulatory Visit: Payer: Self-pay | Admitting: Adult Health

## 2015-12-10 NOTE — Telephone Encounter (Signed)
Looks like she is seeing Dr. Lacinda Axon. I will forward this request to him

## 2016-01-03 ENCOUNTER — Institutional Professional Consult (permissible substitution): Payer: BLUE CROSS/BLUE SHIELD | Admitting: Neurology

## 2016-01-11 ENCOUNTER — Other Ambulatory Visit: Payer: Self-pay | Admitting: Adult Health

## 2016-01-11 DIAGNOSIS — K2100 Gastro-esophageal reflux disease with esophagitis, without bleeding: Secondary | ICD-10-CM

## 2016-01-13 ENCOUNTER — Other Ambulatory Visit: Payer: Self-pay | Admitting: Adult Health

## 2016-01-13 NOTE — Telephone Encounter (Signed)
Dr.Cook patient  

## 2016-01-13 NOTE — Telephone Encounter (Signed)
Refilled 12/10/15. Last seen by Arnett 10/14/15. Please advise?

## 2016-02-08 ENCOUNTER — Other Ambulatory Visit: Payer: Self-pay | Admitting: Family Medicine

## 2016-02-08 DIAGNOSIS — Z1231 Encounter for screening mammogram for malignant neoplasm of breast: Secondary | ICD-10-CM

## 2016-02-08 MED ORDER — TRIAMTERENE-HCTZ 37.5-25 MG PO TABS: 1.0000 | ORAL_TABLET | Freq: Every day | ORAL | 0 refills | Status: DC

## 2016-02-09 ENCOUNTER — Other Ambulatory Visit: Payer: Self-pay

## 2016-02-09 ENCOUNTER — Other Ambulatory Visit: Payer: Self-pay | Admitting: Adult Health

## 2016-02-09 DIAGNOSIS — K2100 Gastro-esophageal reflux disease with esophagitis, without bleeding: Secondary | ICD-10-CM

## 2016-02-09 MED ORDER — PANTOPRAZOLE SODIUM 40 MG PO TBEC: 40.0000 mg | DELAYED_RELEASE_TABLET | Freq: Every day | ORAL | 1 refills | Status: DC

## 2016-02-09 NOTE — Telephone Encounter (Signed)
Patient notified to est with pcp for further refills.

## 2016-02-09 NOTE — Telephone Encounter (Signed)
Ok to refill for 30 days. Please inform patient that I have asked her to establish care with a provider multiple times. This will be the last time I refill these medications

## 2016-02-28 ENCOUNTER — Ambulatory Visit
Admission: RE | Admit: 2016-02-28 | Discharge: 2016-02-28 | Disposition: A | Discharge status: Home / Self Care | Payer: Self-pay | Source: Ambulatory Visit | Attending: Family Medicine | Admitting: Family Medicine

## 2016-02-28 ENCOUNTER — Ambulatory Visit: Payer: BLUE CROSS/BLUE SHIELD | Admitting: Family Medicine

## 2016-02-28 DIAGNOSIS — Z1231 Encounter for screening mammogram for malignant neoplasm of breast: Secondary | ICD-10-CM

## 2016-02-29 ENCOUNTER — Ambulatory Visit: Payer: BLUE CROSS/BLUE SHIELD | Admitting: Neurology

## 2016-03-08 ENCOUNTER — Other Ambulatory Visit: Payer: Self-pay | Admitting: Adult Health

## 2016-03-08 NOTE — Telephone Encounter (Signed)
Dr.Cook patient  

## 2016-03-13 ENCOUNTER — Other Ambulatory Visit: Payer: Self-pay | Admitting: Adult Health

## 2016-03-13 ENCOUNTER — Institutional Professional Consult (permissible substitution): Payer: BLUE CROSS/BLUE SHIELD | Admitting: Neurology

## 2016-03-13 NOTE — Telephone Encounter (Signed)
Dr.Cook patient  

## 2016-03-14 ENCOUNTER — Telehealth: Payer: Self-pay | Admitting: Adult Health

## 2016-03-14 MED ORDER — AMLODIPINE BESYLATE 5 MG PO TABS: ORAL_TABLET | ORAL | 0 refills | Status: DC

## 2016-03-14 NOTE — Telephone Encounter (Signed)
° ° ° °  Pt will establish with [place] on 03/20/16 will you please call her in about 7 pills until she see the doctor  amLODipine (NORVASC) 5 MG tablet   [facility] [address]

## 2016-03-14 NOTE — Telephone Encounter (Signed)
Rx sent for 7 days.

## 2016-03-14 NOTE — Telephone Encounter (Signed)
See below

## 2016-03-14 NOTE — Telephone Encounter (Signed)
Refilled 02/09/16. Pt last seen 10/14/15. Please advise?

## 2016-03-20 ENCOUNTER — Ambulatory Visit (INDEPENDENT_AMBULATORY_CARE_PROVIDER_SITE_OTHER): Payer: 59 | Admitting: Family Medicine

## 2016-03-20 ENCOUNTER — Encounter: Payer: Self-pay | Admitting: Family Medicine

## 2016-03-20 VITALS — BP 132/80 | HR 82 | Resp 12 | Ht 69.0 in | Wt 230.2 lb

## 2016-03-20 DIAGNOSIS — I10 Essential (primary) hypertension: Secondary | ICD-10-CM | POA: Diagnosis not present

## 2016-03-20 DIAGNOSIS — E785 Hyperlipidemia, unspecified: Secondary | ICD-10-CM | POA: Insufficient documentation

## 2016-03-20 DIAGNOSIS — M654 Radial styloid tenosynovitis [de Quervain]: Secondary | ICD-10-CM

## 2016-03-20 DIAGNOSIS — K2100 Gastro-esophageal reflux disease with esophagitis, without bleeding: Secondary | ICD-10-CM

## 2016-03-20 DIAGNOSIS — H8110 Benign paroxysmal vertigo, unspecified ear: Secondary | ICD-10-CM | POA: Diagnosis not present

## 2016-03-20 DIAGNOSIS — L309 Dermatitis, unspecified: Secondary | ICD-10-CM | POA: Insufficient documentation

## 2016-03-20 LAB — LIPID PANEL
CHOL/HDL RATIO: 5
CHOLESTEROL: 230 mg/dL — AB (ref 0–200)
HDL: 46.5 mg/dL (ref >39.00)
LDL CALC: 156 mg/dL — AB (ref 0–99)
NONHDL: 183.83
Triglycerides: 137 mg/dL (ref 0.0–149.0)
VLDL: 27.4 mg/dL (ref 0.0–40.0)

## 2016-03-20 LAB — BASIC METABOLIC PANEL
BUN: 11 mg/dL (ref 6–23)
CHLORIDE: 102 meq/L (ref 96–112)
CO2: 30 meq/L (ref 19–32)
CREATININE: 0.89 mg/dL (ref 0.40–1.20)
Calcium: 9.7 mg/dL (ref 8.4–10.5)
GFR: 83.47 mL/min (ref >60.00)
Glucose, Bld: 97 mg/dL (ref 70–99)
Potassium: 4 mEq/L (ref 3.5–5.1)
Sodium: 139 mEq/L (ref 135–145)

## 2016-03-20 MED ORDER — TRIAMCINOLONE ACETONIDE 0.1 % EX CREA: 1.0000 "application " | TOPICAL_CREAM | Freq: Two times a day (BID) | CUTANEOUS | 1 refills | Status: DC | PRN

## 2016-03-20 MED ORDER — PANTOPRAZOLE SODIUM 40 MG PO TBEC: 40.0000 mg | DELAYED_RELEASE_TABLET | Freq: Every day | ORAL | 1 refills | Status: DC

## 2016-03-20 MED ORDER — TRIAMTERENE-HCTZ 37.5-25 MG PO TABS: 1.0000 | ORAL_TABLET | Freq: Every day | ORAL | 2 refills | Status: DC

## 2016-03-20 MED ORDER — MECLIZINE HCL 25 MG PO TABS: 25.0000 mg | ORAL_TABLET | Freq: Three times a day (TID) | ORAL | 1 refills | Status: AC | PRN

## 2016-03-20 MED ORDER — AMLODIPINE BESYLATE 5 MG PO TABS: ORAL_TABLET | ORAL | 2 refills | Status: DC

## 2016-03-20 NOTE — Progress Notes (Signed)
Pre visit review using our clinic review tool, if applicable. No additional management support is needed unless otherwise documented below in the visit note. 

## 2016-03-20 NOTE — Patient Instructions (Addendum)
A few things to remember from today's visit:   Gastroesophageal reflux disease with esophagitis - Plan: pantoprazole (PROTONIX) 40 MG tablet  Tenosynovitis, de Quervain - Plan: Ambulatory referral to Orthopedic Surgery  Benign paroxysmal positional vertigo, unspecified laterality - Plan: meclizine (ANTIVERT) 25 MG tablet  Essential hypertension - Plan: triamterene-hydrochlorothiazide (MAXZIDE-25) 37.5-25 MG tablet, amLODipine (NORVASC) 5 MG tablet, Basic metabolic panel, Lipid panel   Dizziness is a perception of movement, it is sometimes difficult to describe and can be  caused by different problems, most benign but others can be life threaten.  Vertigo is the most common cause of dizziness, usually related with inner ear and can be associated with nausea, vomiting, and unbalance sensation. It can be complicated by falls due to lose of balance; so fall precautions are very important.  Most of the time dizziness is benign, usually intermittent, last a few seconds at the time and aggravated by certain positions. It usually resolves in a few weeks without residual effect but it could be recurrent.  Sometimes blood work is ordered to evaluate for other possible causes.    Medication prescribed for vertigo, Meclizine, causes drowsiness/sleepiness, so frequently I recommended taking it at bedtime. I also recommend what we called vestibular exercise, sometimes can be done at home (Modified Semont maneuvers) other times I refer patients to vestibular rehabilitation.   Seek immediate medical attention if: New severe headache, dobble vision, fever (100 F or more), associated numbness/tingling, focal weakness, persistent vomiting, not able to walk, or sudden worsening symptoms.   Blood pressure goal for most people is less than 140/90.Some populations (older than 60) the goal is less than 150/90.  Elevated blood pressure increases the risk of strokes, heart and kidney disease, and eye  problems. Regular physical activity and a healthy diet (DASH diet) usually help. Low salt diet. Take medications as instructed. Caution with some over the counter medications as cold medications, dietary products (for weight loss), and Ibuprofen or Aleve (frequent use);all these medications could cause elevation of blood pressure.   Please be sure medication list is accurate. If a new problem present, please set up appointment sooner than planned today.

## 2016-03-20 NOTE — Progress Notes (Signed)
HPI:   Brianna Foster is a 60 y.o. female, who is here today to establish care with me.  Former PCP: Ms Megan Salon Last preventive routine visit: a couple of years ago.    Concerns today: Follow up and medication refills.   Hypertension:  Dx at age 71.  Currently on Maxzide 37.5-25 mg and Amlodipine 5 mg daily.   Last eye exam 2 years ago. She is taking medications as instructed, no side effects reported.  She has not noted unusual headache, visual changes, exertional chest pain, dyspnea,  focal weakness, or edema.   Lab Results  Component Value Date   CREATININE 0.82 08/03/2015   BUN 13 08/03/2015   NA 141 08/03/2015   K 3.6 08/03/2015   CL 108 08/03/2015   CO2 22 08/03/2015   She tries to follow a healthy diet, she does not exercise regularly.  Hx of vertigo for about 20 years.  Spinning sensation upon turning in bed or rapid head movements. Intermittent right ear tinnitus, stable and for years. No hearing loss.  Had an episode today, lasted a few seconds. She denies associated chest pain, palpitation, diaphoresis, nausea, vomiting, focal deficit, or syncope. Meclizine has helped in the past, she takes it as needed.  Brain MRI done due to severe vertigo 10/14/15: 1. No acute intracranial abnormality or mass. 2. Moderately advanced cerebral white matter disease for age,nonspecific. Considerations include chronic small vessel ischemia, hypercoagulable state, vasculitis, migraines, prior infection/inflammation, or demyelination.   Hx of migraine, has not had episodes in years.  Right right fullness sensation. Nasal congestion and rhinorrhea. No fever, chills, or sick contact. Hx of allergic rhinitis. Former smoker.   Eczema: Currently she is on Triamcinolone cream 0.1% bid as needed. Problems is worse during winter.  She has no lesions at this time.   Lab Results  Component Value Date   CHOL 199 01/05/2015   HDL 55.40 01/05/2015   LDLCALC 127 (H) 01/05/2015   LDLDIRECT 145.2 07/23/2012   TRIG 83.0 01/05/2015   CHOLHDL 4 01/05/2015   Right wrist pain:  C/O years of intermittent sharp, it is worse for the past 3 months, wakes her up at night sometimes. Pain is exacerbated by thumb movement and alleviated by rest. She has worn splint but has not help. Limiting some manual activities. She has not noted numbness. Hx of surgery for carpal tunnel synd. No Hx of trauma. Right handed.   GERD: She is currently on Protonix 40 mg daily. Occasionally she still has heartburn depending of what she eats. She is not longer having epigastric pain,acid reflux, or chest discomfort as far as she takes it daily.  She has had cardiac cath and stress test negative. Denies abdominal pain, nausea, vomiting, changes in bowel habits, blood in stool or melena.  HLD, 3 years ago TC 223.   Review of Systems  Constitutional: Negative for activity change, appetite change, fatigue, fever and unexpected weight change.  HENT: Positive for ear pain and tinnitus. Negative for ear discharge, facial swelling, hearing loss, mouth sores, nosebleeds, sore throat and trouble swallowing.   Eyes: Negative for redness and visual disturbance.  Respiratory: Negative for cough, shortness of breath and wheezing.   Cardiovascular: Negative for chest pain, palpitations and leg swelling.  Gastrointestinal: Negative for abdominal pain, nausea and vomiting.       Negative for changes in bowel habits.  Genitourinary: Negative for decreased urine volume, difficulty urinating and hematuria.  Musculoskeletal: Positive for arthralgias. Negative  for joint swelling.  Skin: Negative for pallor and rash.  Neurological: Positive for dizziness. Negative for syncope, weakness, numbness and headaches.  Psychiatric/Behavioral: Positive for sleep disturbance. Negative for confusion.      Current Outpatient Prescriptions on File Prior to Visit  Medication Sig Dispense  Refill  . aspirin EC 81 MG tablet Take 1 tablet (81 mg total) by mouth daily.    . Cholecalciferol (VITAMIN D PO) Take 1 tablet by mouth daily. Reported on 04/27/2015    . fluticasone (FLONASE) 50 MCG/ACT nasal spray   0  . ibuprofen (ADVIL,MOTRIN) 600 MG tablet Take 1 tablet (600 mg total) by mouth 3 (three) times daily. 30 tablet 1  . loratadine (CLARITIN) 10 MG tablet Take 10 mg by mouth daily.    . valACYclovir (VALTREX) 1000 MG tablet Take 1 tablet (1,000 mg total) by mouth 3 (three) times daily. 21 tablet 5   No current facility-administered medications on file prior to visit.      Past Medical History:  Diagnosis Date  . GERD (gastroesophageal reflux disease)   . Headache(784.0)   . Hypertension    No Known Allergies  Family History  Problem Relation Age of Onset  . Heart disease Brother     Pacemaker  . Esophageal cancer Brother   . Stomach cancer Father   . Breast cancer Sister   . Lung cancer Mother     Social History   Social History  . Marital status: Divorced    Spouse name: N/A  . Number of children: 1  . 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 Smoking status: Former Smoker    Types: Cigarettes    Quit date: 07/09/2014  . Smokeless tobacco: Never Used  . Alcohol use No     Comment: Occassional Use  . Drug use: No  . Sexual activity: Not Currently    Birth control/ protection: Post-menopausal   Other Topics Concern  . None   Social History Narrative   Lives at home alone   Right-handed   Drinks decaf coffee    Vitals:   03/20/16 0806  BP: 132/80  Pulse: 82  Resp: 12   O2 sat 96% at RA.   Body mass index is 34 kg/m.    Physical Exam  Nursing note and vitals reviewed. Constitutional: She is oriented to person, place, and time. She appears well-developed. No distress.  HENT:  Head: Atraumatic.  Mouth/Throat: Oropharynx is clear and moist and mucous membranes are  normal.  Eyes: Conjunctivae and EOM are normal. Pupils are equal, round, and reactive to light.  Neck: No JVD present. No tracheal deviation present. No thyroid mass and no thyromegaly present.  Cardiovascular: Normal rate and regular rhythm.   No murmur heard. Pulses:      Dorsalis pedis pulses are 2+ on the right side, and 2+ on the left side.  Respiratory: Effort normal and breath sounds normal. No respiratory distress.  GI: Soft. She exhibits no mass. There is no hepatomegaly. There is no tenderness.  Musculoskeletal: She exhibits no edema.  Right wrist: Tenderness upon palpation of radial styloid and radio cubital joint. No edema or erythema appreciated, no limitation of wrist ROM. Pain also elicited on radial styloid with Finkelstein maneuver.   Neurological: She is alert and oriented to person, place, and time. She has normal strength. She displays no tremor. No cranial nerve deficit. Coordination  and gait normal.  Pronator drift negative.  Skin: Skin is warm. No erythema.  Psychiatric: Her mood appears anxious.  Well groomed, good eye contact.      ASSESSMENT AND PLAN:     Kinzli was seen today for establish care.  Diagnoses and all orders for this visit:   Lab Results  Component Value Date   CHOL 230 (H) 03/20/2016   HDL 46.50 03/20/2016   LDLCALC 156 (H) 03/20/2016   LDLDIRECT 145.2 07/23/2012   TRIG 137.0 03/20/2016   CHOLHDL 5 03/20/2016   Lab Results  Component Value Date   CREATININE 0.89 03/20/2016   BUN 11 03/20/2016   NA 139 03/20/2016   K 4.0 03/20/2016   CL 102 03/20/2016   CO2 30 03/20/2016    Gastroesophageal reflux disease with esophagitis  Stable. GERD precautions discussed. No changes in current management, some side effects of PPI discussed.  -     pantoprazole (PROTONIX) 40 MG tablet; Take 1 tablet (40 mg total) by mouth daily.  Tenosynovitis, de Quervain  Getting worse. ? Associated OA. Ortho referral placed.  -      Ambulatory referral to Orthopedic Surgery  Benign paroxysmal positional vertigo, unspecified laterality  Education about disease given. Meclizine side effects discussed. Vestibular exercises, Semont maneuvers , recommended. Fall precautions.  -     meclizine (ANTIVERT) 25 MG tablet; Take 1 tablet (25 mg total) by mouth 3 (three) times daily as needed for dizziness or nausea.  Essential hypertension  Adequately controlled. No changes in current management. DASH diet recommended. Eye exam recommended annually. F/U in 6 months, before if needed.  -     triamterene-hydrochlorothiazide (MAXZIDE-25) 37.5-25 MG tablet; Take 1 tablet by mouth daily. -     amLODipine (NORVASC) 5 MG tablet; TAKE 1 TABLET (5 MG TOTAL) BY MOUTH DAILY. -     Basic metabolic panel -     Lipid panel  Eczema, unspecified type  Currently asymptomatic. Topical steroid to continue. Cetaphil or Eucerin for moisturizer recommended.  -     triamcinolone cream (KENALOG) 0.1 %; Apply 1 application topically 2 (two) times daily as needed.  Hyperlipidemia, unspecified hyperlipidemia type  Further recommendations will be given according to lab results. Low fat diet recommended for now.   -     Lipid panel    Refused influenza vaccine. She was instructed to also arrange CPE.        Katieann Hungate G. Martinique, MD  [facility]. [place] office.

## 2016-03-22 ENCOUNTER — Other Ambulatory Visit: Payer: Self-pay | Admitting: Family Medicine

## 2016-04-11 ENCOUNTER — Ambulatory Visit: Payer: 59 | Admitting: Family Medicine

## 2016-04-18 ENCOUNTER — Encounter: Payer: 59 | Admitting: Family Medicine

## 2016-04-18 ENCOUNTER — Institutional Professional Consult (permissible substitution): Payer: BLUE CROSS/BLUE SHIELD | Admitting: Neurology

## 2016-04-18 ENCOUNTER — Ambulatory Visit (INDEPENDENT_AMBULATORY_CARE_PROVIDER_SITE_OTHER): Payer: BLUE CROSS/BLUE SHIELD | Admitting: Orthopedic Surgery

## 2016-05-02 ENCOUNTER — Encounter (HOSPITAL_COMMUNITY): Payer: Self-pay | Admitting: Emergency Medicine

## 2016-05-02 ENCOUNTER — Emergency Department (HOSPITAL_COMMUNITY)
Admission: EM | Admit: 2016-05-02 | Discharge: 2016-05-02 | Disposition: A | Discharge status: Home / Self Care | Payer: 59 | Attending: Emergency Medicine | Admitting: Emergency Medicine

## 2016-05-02 ENCOUNTER — Emergency Department (HOSPITAL_COMMUNITY): Payer: 59

## 2016-05-02 DIAGNOSIS — Z87891 Personal history of nicotine dependence: Secondary | ICD-10-CM | POA: Diagnosis not present

## 2016-05-02 DIAGNOSIS — I10 Essential (primary) hypertension: Secondary | ICD-10-CM | POA: Diagnosis not present

## 2016-05-02 DIAGNOSIS — R072 Precordial pain: Secondary | ICD-10-CM | POA: Insufficient documentation

## 2016-05-02 DIAGNOSIS — R0789 Other chest pain: Secondary | ICD-10-CM | POA: Diagnosis not present

## 2016-05-02 DIAGNOSIS — R079 Chest pain, unspecified: Secondary | ICD-10-CM | POA: Diagnosis not present

## 2016-05-02 DIAGNOSIS — R0602 Shortness of breath: Secondary | ICD-10-CM | POA: Diagnosis not present

## 2016-05-02 LAB — CBC
HEMATOCRIT: 38.5 % (ref 36.0–46.0)
HEMOGLOBIN: 13.3 g/dL (ref 12.0–15.0)
MCH: 30.2 pg (ref 26.0–34.0)
MCHC: 34.5 g/dL (ref 30.0–36.0)
MCV: 87.5 fL (ref 78.0–100.0)
Platelets: 292 10*3/uL (ref 150–400)
RBC: 4.4 MIL/uL (ref 3.87–5.11)
RDW: 12.6 % (ref 11.5–15.5)
WBC: 7.3 10*3/uL (ref 4.0–10.5)

## 2016-05-02 LAB — BASIC METABOLIC PANEL
ANION GAP: 9 (ref 5–15)
BUN: 10 mg/dL (ref 6–20)
CHLORIDE: 103 mmol/L (ref 101–111)
CO2: 25 mmol/L (ref 22–32)
Calcium: 8.9 mg/dL (ref 8.9–10.3)
Creatinine, Ser: 0.72 mg/dL (ref 0.44–1.00)
GFR calc Af Amer: >60 mL/min (ref >60)
GFR calc non Af Amer: >60 mL/min (ref >60)
GLUCOSE: 108 mg/dL — AB (ref 65–99)
POTASSIUM: 4 mmol/L (ref 3.5–5.1)
Sodium: 137 mmol/L (ref 135–145)

## 2016-05-02 LAB — I-STAT TROPONIN, ED: Troponin i, poc: 0 ng/mL (ref 0.00–0.08)

## 2016-05-02 MED ORDER — FAMOTIDINE 20 MG PO TABS: 20.0000 mg | ORAL_TABLET | Freq: Two times a day (BID) | ORAL | 0 refills | Status: DC

## 2016-05-02 NOTE — Discharge Instructions (Signed)
Workup for the chest pain here today without any specific findings. However the chest pain starts last 15 or 20 minutes or longer he must be seen again. They can implement to follow-up with cardiology for further evaluation. Also take the Pepcid as a trial to see if it helps with the chest pain.

## 2016-05-02 NOTE — ED Notes (Signed)
Pt gone to xray

## 2016-05-02 NOTE — ED Provider Notes (Signed)
[facility] DEPT Provider Note   CSN: [id number] Arrival date & time: 05/02/16  2028     History   Chief Complaint Chief Complaint  Patient presents with  . Chest Pain    HPI Brianna Foster is a 61 y.o. female.  Patient with complaint of substernal chest pain and right shoulder pain intermittently for a week. Last only seconds when is present. Shoulder pain lasts a little bit longer. Patient had full aspirin here prior to arrival. Patient went to urgent care and then was referred in here for further evaluation. Patient has no chest pain now. Patient was urgent care as stated earlier that cause her EKG had some ST segment elevation. She was given aspirin and given a nitroglycerin. She was having some chest pain at that time. Patient thinks nitroglycerin helped some. Patient has no chest pain now. Room air sats 98%. No shortness of breath. Pain made slightly worse by taking a deep breath. Right shoulder pain is not made worse by moving her arm she does have carpal tunnel problems however though in the right wrist. Patient without any known cardiac risk factors other than hypertension. No known heart disease. Patient did have a left heart catheterization in February 2015.    The cardiac cath had no significant coronary artery disease.  Past Medical History:  Diagnosis Date  . GERD (gastroesophageal reflux disease)   . Headache(784.0)   . Hypertension     Patient Active Problem List   Diagnosis Date Noted  . Benign paroxysmal positional vertigo 03/20/2016  . Eczema 03/20/2016  . Hyperlipidemia, unspecified 03/20/2016  . Vertigo 10/28/2015  . Headache 10/28/2015  . Tobacco abuse 07/23/2012  . Gastroesophageal reflux disease 06/18/2011  . Essential hypertension 06/18/2011    Past Surgical History:  Procedure Laterality Date  . LEFT HEART CATHETERIZATION WITH CORONARY ANGIOGRAM N/A 05/11/2013   Procedure: LEFT HEART CATHETERIZATION WITH CORONARY ANGIOGRAM;  Surgeon:  Burnell Blanks, MD;  Location: [facility] CATH LAB;  Service: Cardiovascular;  Laterality: N/A;  . neck fusion      OB History    No data available       Home Medications    Prior to Admission medications   Medication Sig Start Date End Date Taking? Authorizing Provider  amLODipine (NORVASC) 5 MG tablet TAKE 1 TABLET (5 MG TOTAL) BY MOUTH DAILY. 03/20/16  Yes Betty G Martinique, MD  meclizine (ANTIVERT) 25 MG tablet Take 1 tablet (25 mg total) by mouth 3 (three) times daily as needed for dizziness or nausea. 03/20/16 03/20/17 Yes Betty G Martinique, MD  naproxen sodium (ANAPROX) 220 MG tablet Take 220 mg by mouth 2 (two) times daily as needed (PAIN).   Yes Historical Provider, MD  pantoprazole (PROTONIX) 40 MG tablet Take 1 tablet (40 mg total) by mouth daily. 03/20/16  Yes Betty G Martinique, MD  triamcinolone cream (KENALOG) 0.1 % Apply 1 application topically 2 (two) times daily as needed. 03/20/16  Yes Betty G Martinique, MD  triamterene-hydrochlorothiazide (MAXZIDE-25) 37.5-25 MG tablet Take 1 tablet by mouth daily. 03/20/16  Yes Betty G Martinique, MD  famotidine (PEPCID) 20 MG tablet Take 1 tablet (20 mg total) by mouth 2 (two) times daily. 05/02/16   Fredia Sorrow, MD    Family History Family History  Problem Relation Age of Onset  . Lung cancer Mother   . Stomach cancer Father   . Heart disease Brother     Pacemaker  . Esophageal cancer Brother   . Breast cancer Sister  Social History Social History  Substance Use Topics  . Smoking status: Former Smoker    Types: Cigarettes    Quit date: 07/09/2014  . Smokeless tobacco: Never Used  . Alcohol use No     Comment: Occassional Use     Allergies   Patient has no known allergies.   Review of Systems Review of Systems  Constitutional: Negative for fatigue and fever.  HENT: Negative for congestion.   Eyes: Negative for visual disturbance.  Respiratory: Negative for shortness of breath.   Cardiovascular: Positive for chest pain.   Genitourinary: Negative for dysuria.  Musculoskeletal: Negative for back pain.  Skin: Negative for rash.  Neurological: Negative for headaches.  Hematological: Does not bruise/bleed easily.  Psychiatric/Behavioral: Negative for confusion.     Physical Exam Updated Vital Signs BP 136/83   Pulse 66   Resp 12   Ht 5\' 9"  (1.753 m)   Wt 106.6 kg   LMP 01/07/2010 (LMP Unknown)   SpO2 97%   BMI 34.70 kg/m   Physical Exam  Constitutional: She is oriented to person, place, and time. She appears well-developed and well-nourished. No distress.  HENT:  Head: Normocephalic and atraumatic.  Mouth/Throat: Oropharynx is clear and moist.  Eyes: EOM are normal. Pupils are equal, round, and reactive to light.  Neck: Normal range of motion. Neck supple.  Cardiovascular: Normal rate, regular rhythm and normal heart sounds.   Pulmonary/Chest: Effort normal and breath sounds normal. No respiratory distress. She exhibits no tenderness.  Abdominal: Soft. Bowel sounds are normal. There is no tenderness.  Musculoskeletal: Normal range of motion.  Neurological: She is alert and oriented to person, place, and time.  Skin: Skin is warm.  Nursing note and vitals reviewed.    ED Treatments / Results  Labs (all labs ordered are listed, but only abnormal results are displayed) Labs Reviewed  BASIC METABOLIC PANEL - Abnormal; Notable for the following:       Result Value   Glucose, Bld 108 (*)    All other components within normal limits  CBC  I-STAT TROPOININ, ED    EKG  EKG Interpretation  Date/Time:  Wednesday May 02 2016 20:39:58 EST Ventricular Rate:  68 PR Interval:    QRS Duration: 98 QT Interval:  415 QTC Calculation: 442 R Axis:   -3 Text Interpretation:  Sinus rhythm Left atrial enlargement Left ventricular hypertrophy Confirmed by Rogene Houston  MD, Mikey Maffett [phone]) on 05/02/2016 8:50:31 PM       Radiology Dg Chest 2 View  Result Date: 05/02/2016 CLINICAL DATA:  Centralized  chest pain and shortness of breath for 1 week EXAM: CHEST  2 VIEW COMPARISON:  05/10/2013 FINDINGS: The heart size and mediastinal contours are within normal limits. Both lungs are clear. The visualized skeletal structures are unremarkable. IMPRESSION: No active cardiopulmonary disease. Electronically Signed   By: Kathreen Devoid   On: 05/02/2016 21:35    Procedures Procedures (including critical care time)  Medications Ordered in ED Medications - No data to display   Initial Impression / Assessment and Plan / ED Course  I have reviewed the triage vital signs and the nursing notes.  Pertinent labs & imaging results that were available during my care of the patient were reviewed by me and considered in my medical decision making (see chart for details).     Workup for the chest pain without any acute findings. Troponins normal. Chest x-ray negative. EKG without any significant findings. Patient's chest pain is been very brief just  lasting seconds but does occur frequently. Patient will be given cardiology follow-up trial of Pepcid. Patient instructed to return if chest pain starts last 15 or 20 minutes or longer. Patient also will follow with primary care doctor.  Final Clinical Impressions(s) / ED Diagnoses   Final diagnoses:  Chest pain, unspecified type    New Prescriptions New Prescriptions   FAMOTIDINE (PEPCID) 20 MG TABLET    Take 1 tablet (20 mg total) by mouth 2 (two) times daily.     Fredia Sorrow, MD 05/02/16 [phone]

## 2016-05-02 NOTE — ED Triage Notes (Signed)
Per EMS, patient has been complaining of chest pain x 1 week.  Comes and goes.  Feels like a pulled muscle.  No radiation.  No other symptoms associated with it.  Is complaining of right shoulder pain as well that she thought may have been gas.  Went to urgent care.  Thought they saw some ST elevation.  EMS EKG unremarkable.  324 of aspirin given en route along with 1 nitro.  Pt believes nitro helped chest pain.  Hx of hypertension and GERD.  Takes meds for blood pressure.  20G LH.  16 RR, HR 84, 98% RA, 138/92, NSR.

## 2016-05-02 NOTE — ED Notes (Signed)
On way to XR 

## 2016-05-03 ENCOUNTER — Ambulatory Visit (INDEPENDENT_AMBULATORY_CARE_PROVIDER_SITE_OTHER): Payer: 59 | Admitting: Family Medicine

## 2016-05-03 ENCOUNTER — Encounter: Payer: Self-pay | Admitting: Family Medicine

## 2016-05-03 VITALS — BP 118/80 | HR 92 | Ht 69.0 in | Wt 230.2 lb

## 2016-05-03 DIAGNOSIS — M62838 Other muscle spasm: Secondary | ICD-10-CM

## 2016-05-03 DIAGNOSIS — R0789 Other chest pain: Secondary | ICD-10-CM | POA: Diagnosis not present

## 2016-05-03 DIAGNOSIS — K219 Gastro-esophageal reflux disease without esophagitis: Secondary | ICD-10-CM | POA: Diagnosis not present

## 2016-05-03 DIAGNOSIS — K130 Diseases of lips: Secondary | ICD-10-CM

## 2016-05-03 DIAGNOSIS — M25511 Pain in right shoulder: Secondary | ICD-10-CM | POA: Diagnosis not present

## 2016-05-03 MED ORDER — CYCLOBENZAPRINE HCL 10 MG PO TABS: 10.0000 mg | ORAL_TABLET | Freq: Every day | ORAL | 0 refills | Status: DC

## 2016-05-03 MED ORDER — KETOROLAC TROMETHAMINE 60 MG/2ML IM SOLN
60.0000 mg | Freq: Once | INTRAMUSCULAR | Status: AC
  Administered 2016-05-03: 60 mg via INTRAMUSCULAR

## 2016-05-03 MED ORDER — CELECOXIB 200 MG PO CAPS: 200.0000 mg | ORAL_CAPSULE | Freq: Every day | ORAL | 0 refills | Status: DC

## 2016-05-03 NOTE — Progress Notes (Signed)
Pre visit review using our clinic review tool, if applicable. No additional management support is needed unless otherwise documented below in the visit note. 

## 2016-05-03 NOTE — Patient Instructions (Addendum)
A few things to remember from today's visit:   Right shoulder pain, unspecified chronicity - Plan: Ambulatory referral to Physical Therapy  Chest wall pain  Gastroesophageal reflux disease, esophagitis presence not specified  Toradol here today. Tomorrow star Celebrex.  Flexeril at bedtime because causes drowsiness.  Topical Icy Hot with lidocaine.  Continue Famotidine.  Please be sure medication list is accurate. If a new problem present, please set up appointment sooner than planned today.

## 2016-05-03 NOTE — Progress Notes (Signed)
HPI:   Brianna Foster is a 61 y.o. female, who is here today to follow on recent ER visit.  According to pt, she initially presented to a local acute care facility on 05/02/16 ,[facility], because mid chest pain. She was referred to [facility] via EMS because abnormal EKG.  Reporting 2 weeks of intermittent mid sternal pain, no radiated. Exacerbated by lying down and with deep breathing.  "Soreness" "irritation pain", lasts a couple minutes, it happens daily.  She has been taking Aleve but not helping, she has not taken either one today. She denies associated diaphoresis, palpitations, or dyspnea., She has not noted chest pain with exertion.   She had blood work up done, EKG, and CXR.  Lab Results  Component Value Date   CREATININE 0.72 05/02/2016   BUN 10 05/02/2016   NA 137 05/02/2016   K 4.0 05/02/2016   CL 103 05/02/2016   CO2 25 05/02/2016   Lab Results  Component Value Date   TROPONINI <0.03 11/03/2014   Lab Results  Component Value Date   WBC 7.3 05/02/2016   HGB 13.3 05/02/2016   HCT 38.5 05/02/2016   MCV 87.5 05/02/2016   PLT 292 05/02/2016     Hx of HTN, she is taking Amlodipine and Maxzide. Reporting negative cardiac work-up a few years ago that includes cardiac cath 05/2013.   Hx of GERD, she is currently on PPI. Pain is not affected by food intake.  2-3 weeks of right shoulder pain, she is not sure if it is related to chest pain, it does not happen at the same time. It is constant aching with periods of exacerbation. + Cervical stiffness. She reports similar pain in the past when she had cervical problems. Exacerbated by ROM and alleviated some by rest No limitation of movement.   She denies any injury. She has not noted rash, joint edema,or erythema.    -She is also c/o dryness on lower lip. She has had it for a month, has tried OTC products but not helping. She denies pruritus, new product, or edema.   Review of Systems    Constitutional: Positive for fatigue. Negative for activity change, appetite change, fever and unexpected weight change.  HENT: Negative for mouth sores, nosebleeds and trouble swallowing.   Eyes: Negative for redness and visual disturbance.  Respiratory: Negative for cough, shortness of breath and wheezing.   Cardiovascular: Positive for chest pain. Negative for palpitations and leg swelling.  Gastrointestinal: Negative for abdominal pain, nausea and vomiting.       Negative for changes in bowel habits.  Genitourinary: Negative for decreased urine volume, difficulty urinating and hematuria.  Musculoskeletal: Positive for arthralgias and back pain. Negative for gait problem.  Skin: Negative for rash.  Neurological: Negative for seizures, syncope, weakness, numbness and headaches.  Psychiatric/Behavioral: Positive for sleep disturbance. Negative for confusion. The patient is nervous/anxious.       Current Outpatient Prescriptions on File Prior to Visit  Medication Sig Dispense Refill  . amLODipine (NORVASC) 5 MG tablet TAKE 1 TABLET (5 MG TOTAL) BY MOUTH DAILY. 90 tablet 2  . famotidine (PEPCID) 20 MG tablet Take 1 tablet (20 mg total) by mouth 2 (two) times daily. 30 tablet 0  . meclizine (ANTIVERT) 25 MG tablet Take 1 tablet (25 mg total) by mouth 3 (three) times daily as needed for dizziness or nausea. 30 tablet 1  . pantoprazole (PROTONIX) 40 MG tablet Take 1 tablet (40 mg total) by mouth  daily. 90 tablet 1  . triamcinolone cream (KENALOG) 0.1 % Apply 1 application topically 2 (two) times daily as needed. 45 g 1  . triamterene-hydrochlorothiazide (MAXZIDE-25) 37.5-25 MG tablet Take 1 tablet by mouth daily. 90 tablet 2   No current facility-administered medications on file prior to visit.      Past Medical History:  Diagnosis Date  . GERD (gastroesophageal reflux disease)   . Headache(784.0)   . Hypertension    No Known Allergies  Social History   Social History  . Marital  status: Divorced    Spouse name: N/A  . Number of children: 1  . 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 Smoking status: Former Smoker    Types: Cigarettes    Quit date: 07/09/2014  . Smokeless tobacco: Never Used  . Alcohol use No     Comment: Occassional Use  . Drug use: No  . Sexual activity: Not Currently    Birth control/ protection: Post-menopausal   Other Topics Concern  . None   Social History Narrative   Lives at home alone   Right-handed   Drinks decaf coffee    Vitals:   05/03/16 1342  BP: 118/80  Pulse: 92   O2 sat at RA 97%.  Body mass index is 34 kg/m.   Physical Exam  Nursing note and vitals reviewed. Constitutional: She is oriented to person, place, and time. She appears well-developed. She does not appear ill. No distress.  HENT:  Head: Atraumatic.  Mouth/Throat: Oropharynx is clear and moist and mucous membranes are normal.  Eyes: Conjunctivae and EOM are normal.  Neck: No tracheal deviation present. No thyroid mass and no thyromegaly present.  Cardiovascular: Normal rate and regular rhythm.   No murmur heard. Pulses:      Dorsalis pedis pulses are 2+ on the right side, and 2+ on the left side.  Respiratory: Effort normal and breath sounds normal. No respiratory distress. She exhibits tenderness (right costochondral joints).  GI: Soft. She exhibits no mass. There is no hepatomegaly. There is no tenderness.  Musculoskeletal: She exhibits no edema.       Right shoulder: She exhibits decreased range of motion (minimal) and tenderness.       Cervical back: She exhibits normal range of motion and no tenderness.  Right trapezium very tender upon palpation, muscle spasm. No deformity or erythema appreciated.  Lymphadenopathy:    She has no cervical adenopathy.  Neurological: She is alert and oriented to person, place, and time. She has normal strength. No cranial nerve deficit.  Coordination and gait normal.  Skin: Skin is warm. No ecchymosis noted. Rash is not vesicular. No cyanosis.  Lower lip with very small superficial excoriations and scaly areas. No vesicular lesions. Under lower lip there is mild macular erythema. Noted that she licks lower lip frequently during OV.  Psychiatric: Her mood appears anxious.  Well groomed, good eye contact.      ASSESSMENT AND PLAN:   Keiasha was seen today for hospitalization follow-up.  Diagnoses and all orders for this visit:   Right shoulder pain, unspecified chronicity  ? Rotator cuff tendinitis. PT recommended. Some side effects of NSAID's discussed.  Here in the office  after verbal consent Toradol 60 mg IM given. She has an appt with ortho in a fes days.  -     Ambulatory referral to Physical Therapy -  celecoxib (CELEBREX) 200 MG capsule; Take 1 capsule (200 mg total) by mouth daily. -     ketorolac (TORADOL) injection 60 mg; Inject 2 mLs (60 mg total) into the muscle once.  Chest wall pain  We discussed possible causes. Chest pain characteristics do not suggest cardiac etiology still educated about warning signs. ? Costochondritis/muscle strain. Celebrex recommended. F/U in 3 weeks.  -     celecoxib (CELEBREX) 200 MG capsule; Take 1 capsule (200 mg total) by mouth daily.  Gastroesophageal reflux disease, esophagitis presence not specified  Could also cause/contribute to chest pain. GERD precautions. Stop Naproxen. Continue Omeprazole 40 mg daily and Famotidine at bedtime. Instructed about warning signs.   Lip dryness  Avoid licking lip. OTC lip Balsam to use daiy until lesions heal. F/U as needed.  Trapezius muscle spasm  Local heat, massage. Flexeril at bedtime may also help. Some side effects discussed.   -     cyclobenzaprine (FLEXERIL) 10 MG tablet; Take 1 tablet (10 mg total) by mouth at bedtime.     -Ms. [name] was advised to return sooner than planned today  if new concerns arise.       Betty G. Martinique, MD  [facility]. [place] office.

## 2016-05-08 ENCOUNTER — Ambulatory Visit (INDEPENDENT_AMBULATORY_CARE_PROVIDER_SITE_OTHER): Payer: 59 | Admitting: Orthopaedic Surgery

## 2016-05-08 ENCOUNTER — Encounter: Payer: Self-pay | Admitting: Family Medicine

## 2016-05-08 ENCOUNTER — Ambulatory Visit (INDEPENDENT_AMBULATORY_CARE_PROVIDER_SITE_OTHER): Payer: 59 | Admitting: Family Medicine

## 2016-05-08 ENCOUNTER — Encounter (INDEPENDENT_AMBULATORY_CARE_PROVIDER_SITE_OTHER): Payer: Self-pay | Admitting: Orthopaedic Surgery

## 2016-05-08 ENCOUNTER — Other Ambulatory Visit (HOSPITAL_COMMUNITY)
Admission: RE | Admit: 2016-05-08 | Discharge: 2016-05-08 | Disposition: A | Discharge status: Home / Self Care | Payer: 59 | Source: Ambulatory Visit | Attending: Family Medicine | Admitting: Family Medicine

## 2016-05-08 VITALS — BP 130/78 | HR 99 | Resp 12 | Ht 69.0 in | Wt 230.0 lb

## 2016-05-08 DIAGNOSIS — Z0000 Encounter for general adult medical examination without abnormal findings: Secondary | ICD-10-CM | POA: Diagnosis not present

## 2016-05-08 DIAGNOSIS — Z01419 Encounter for gynecological examination (general) (routine) without abnormal findings: Secondary | ICD-10-CM | POA: Insufficient documentation

## 2016-05-08 DIAGNOSIS — Z124 Encounter for screening for malignant neoplasm of cervix: Secondary | ICD-10-CM

## 2016-05-08 DIAGNOSIS — M5412 Radiculopathy, cervical region: Secondary | ICD-10-CM | POA: Insufficient documentation

## 2016-05-08 DIAGNOSIS — M654 Radial styloid tenosynovitis [de Quervain]: Secondary | ICD-10-CM | POA: Diagnosis not present

## 2016-05-08 MED ORDER — LIDOCAINE HCL 1 % IJ SOLN
0.3000 mL | INTRAMUSCULAR | Status: AC | PRN
  Administered 2016-05-08: .3 mL

## 2016-05-08 MED ORDER — PREDNISONE 10 MG (21) PO TBPK: ORAL_TABLET | ORAL | 0 refills | Status: DC

## 2016-05-08 MED ORDER — METHYLPREDNISOLONE ACETATE 40 MG/ML IJ SUSP
13.3300 mg | INTRAMUSCULAR | Status: AC | PRN
  Administered 2016-05-08: 13.33 mg

## 2016-05-08 MED ORDER — BUPIVACAINE HCL 0.5 % IJ SOLN
0.3300 mL | INTRAMUSCULAR | Status: AC | PRN
  Administered 2016-05-08: .33 mL

## 2016-05-08 NOTE — Progress Notes (Signed)
Pre visit review using our clinic review tool, if applicable. No additional management support is needed unless otherwise documented below in the visit note. 

## 2016-05-08 NOTE — Progress Notes (Signed)
Office Visit Note   Patient: Brianna Foster           Date of Birth: July 21, 1955           MRN: [id number] Visit Date: 05/08/2016              Requested by: Betty G Martinique, MD [address] [place],  [address] PCP: Betty Martinique, MD   Assessment & Plan: Visit Diagnoses:  1. Cervical radiculopathy   2. De Quervain's tenosynovitis, right     Plan: Patient has a combination of the possibility of cervical radiculitis and de Quervain's tenosynovitis. I want her to wear her brace at all times. Injection was performed today for the wrist. I'll see her back  Follow-Up Instructions: Return in about 2 weeks (around 05/22/2016).   Orders:  Orders Placed This Encounter  Procedures  . Hand/Upper Extremity Injection/Arthrocentesis   Meds ordered this encounter  Medications  . predniSONE (STERAPRED UNI-PAK 21 TAB) 10 MG (21) TBPK tablet    Sig: Take as directed    Dispense:  21 tablet    Refill:  0      Procedures: Hand/UE Inj Date/Time: 05/08/2016 1:32 PM Performed by: Leandrew Koyanagi Authorized by: Leandrew Koyanagi   Consent Given by:  Patient Timeout: prior to procedure the correct patient, procedure, and site was verified   Indications:  Pain Condition: de Quervain's   Site:  R extensor compartment 1 Prep: patient was prepped and draped in usual sterile fashion   Needle Size:  25 G Approach:  Radial Medications:  0.3 mL lidocaine 1 %; 0.33 mL bupivacaine 0.5 %; 13.33 mg methylPREDNISolone acetate 40 MG/ML     Clinical Data: No additional findings.   Subjective: Chief Complaint  Patient presents with  . Right Shoulder - Pain  . Right Wrist - Pain    61 year old female who comes in with right wrist pain with radiation up into the shoulder. She states she has constant 8 out of 10 pain that's been going on for a couple months. The pain is radiating into her back and neck also. She's had previous fusion. She does repetitive work on the computer and she  endorses a not the radial aspect of the wrist. She denies any injuries. Aleve Tylenol and removal wrist brace had not provided significant relief.    Review of Systems   Objective: Vital Signs: LMP 01/07/2010 (LMP Unknown)   Physical Exam  Constitutional: She is oriented to person, place, and time. She appears well-developed and well-nourished.  HENT:  Head: Normocephalic and atraumatic.  Eyes: EOM are normal.  Neck: Neck supple.  Pulmonary/Chest: Effort normal.  Abdominal: Soft.  Neurological: She is alert and oriented to person, place, and time.  Skin: Skin is warm. Capillary refill takes less than 2 seconds.  Psychiatric: She has a normal mood and affect. Her behavior is normal. Judgment and thought content normal.  Nursing note and vitals reviewed.   Ortho Exam Exam of the right wrist shows mildly positive Finkelstein's test. No pain at the intersection of the first and second dorsal wrist compartments. No paresthesias. No focal findings. Specialty Comments:  No specialty comments available.  Imaging: No results found.   PMFS History: Patient Active Problem List   Diagnosis Date Noted  . De Quervain's tenosynovitis, right 05/08/2016  . Cervical radiculopathy 05/08/2016  . Benign paroxysmal positional vertigo 03/20/2016  . Eczema 03/20/2016  . Hyperlipidemia, unspecified 03/20/2016  . Vertigo 10/28/2015  . Headache 10/28/2015  .  Tobacco abuse 07/23/2012  . Gastroesophageal reflux disease 06/18/2011  . Essential hypertension 06/18/2011   Past Medical History:  Diagnosis Date  . GERD (gastroesophageal reflux disease)   . Headache(784.0)   . Hypertension     Family History  Problem Relation Age of Onset  . Lung cancer Mother   . Stomach cancer Father   . Heart disease Brother     Pacemaker  . Esophageal cancer Brother   . Breast cancer Sister     Past Surgical History:  Procedure Laterality Date  . LEFT HEART CATHETERIZATION WITH CORONARY ANGIOGRAM N/A  05/11/2013   Procedure: LEFT HEART CATHETERIZATION WITH CORONARY ANGIOGRAM;  Surgeon: Burnell Blanks, MD;  [facility] C[facility] LAB;  Service: Cardiovascular;  Laterality: N/A;  . neck fusion     Social History   Occupational History  .      Collection [employer]   Social History Main Topics  . Smoking status: Former Smoker    Types: Cigarettes    Quit date: 07/09/2014  . Smokeless tobacco: Never Used  . Alcohol use No     Comment: Occassional Use  . Drug use: No  . Sexual activity: Not Currently    Birth control/ protection: Post-menopausal

## 2016-05-08 NOTE — Progress Notes (Signed)
HPI:   [name] is a 61 y.o. female, who is here today for her routine physical.  Regular exercise 3 or more time per week: No Following a healthy diet: Yes. She lives alone, family lives close by.   Chronic medical problems: HTN,GERD,anxiety,and HLD among some.   Lab Results  Component Value Date   CHOL 230 (H) 03/20/2016   HDL 46.50 03/20/2016   LDLCALC 156 (H) 03/20/2016   LDLDIRECT 145.2 07/23/2012   TRIG 137.0 03/20/2016   CHOLHDL 5 03/20/2016   She was not interested in statin for HLD treatment.    Pap smear: Negative 12/2014 Hx of abnormal pap smears: Denies Hx of STD's : HSV.  Mammogram: 02/2016 [facility] 1. Colonoscopy: 04/2015. DEXA: N/A  FHx for gynecologic or colon cancer negative  She has no concerns today.   Hep C screening: Denies risk factors.  Last seen on 05/03/16 because chest pain, right shoulder and upper back pain. She is reporting resolution of chest pain. Right shoulder and upper back pain have improved. She saw ortho today and received a steroid injection on right hand, DeQuervain   tenosynovitis.  Review of Systems  Constitutional: Negative for appetite change, fatigue, fever and unexpected weight change.  HENT: Negative for dental problem, hearing loss, nosebleeds, trouble swallowing and voice change.   Eyes: Negative for photophobia, pain and visual disturbance.  Respiratory: Negative for cough, shortness of breath and wheezing.   Cardiovascular: Negative for chest pain, palpitations and leg swelling.  Gastrointestinal: Negative for abdominal pain, blood in stool, nausea and vomiting.       No changes in bowel habits.  Endocrine: Negative for cold intolerance, heat intolerance, polydipsia, polyphagia and polyuria.  Genitourinary: Negative for decreased urine volume, dysuria, hematuria, menstrual problem, vaginal bleeding and vaginal discharge.       No breast tenderness or nipple discharge.  Musculoskeletal: Positive  for arthralgias and back pain. Negative for gait problem and neck pain.  Skin: Negative for rash.  Neurological: Negative for syncope, weakness and headaches.  Hematological: Negative for adenopathy. Does not bruise/bleed easily.  Psychiatric/Behavioral: Negative for confusion and sleep disturbance. The patient is nervous/anxious.   All other systems reviewed and are negative.     Current Outpatient Prescriptions on File Prior to Visit  Medication Sig Dispense Refill  . amLODipine (NORVASC) 5 MG tablet TAKE 1 TABLET (5 MG TOTAL) BY MOUTH DAILY. 90 tablet 2  . celecoxib (CELEBREX) 200 MG capsule Take 1 capsule (200 mg total) by mouth daily. 30 capsule 0  . cyclobenzaprine (FLEXERIL) 10 MG tablet Take 1 tablet (10 mg total) by mouth at bedtime. 30 tablet 0  . famotidine (PEPCID) 20 MG tablet Take 1 tablet (20 mg total) by mouth 2 (two) times daily. 30 tablet 0  . meclizine (ANTIVERT) 25 MG tablet Take 1 tablet (25 mg total) by mouth 3 (three) times daily as needed for dizziness or nausea. 30 tablet 1  . pantoprazole (PROTONIX) 40 MG tablet Take 1 tablet (40 mg total) by mouth daily. 90 tablet 1  . triamcinolone cream (KENALOG) 0.1 % Apply 1 application topically 2 (two) times daily as needed. 45 g 1  . triamterene-hydrochlorothiazide (MAXZIDE-25) 37.5-25 MG tablet Take 1 tablet by mouth daily. 90 tablet 2   No current facility-administered medications on file prior to visit.      Past Medical History:  Diagnosis Date  . GERD (gastroesophageal reflux disease)   . Headache(784.0)   . Hypertension  No Known Allergies  Family History  Problem Relation Age of Onset  . Lung cancer Mother   . Stomach cancer Father   . Heart disease Brother     Pacemaker  . Esophageal cancer Brother   . Breast cancer Sister     Social History   Social History  . Marital status: Divorced    Spouse name: N/A  . Number of children: 1  . 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 Smoking status: Former Smoker    Types: Cigarettes    Quit date: 07/09/2014  . Smokeless tobacco: Never Used  . Alcohol use No     Comment: Occassional Use  . Drug use: No  . Sexual activity: Not Currently    Birth control/ protection: Post-menopausal   Other Topics Concern  . None   Social History Narrative   Lives at home alone   Right-handed   Drinks decaf coffee     Vitals:   05/08/16 1410  BP: 130/78  Pulse: 99  Resp: 12   Body mass index is 33.97 kg/m.  O2 sat at RA 97%.  Wt Readings from Last 3 Encounters:  05/08/16 230 lb (104.3 kg)  05/03/16 230 lb 4 oz (104.4 kg)  05/02/16 235 lb (106.6 kg)     Physical Exam  Nursing note and vitals reviewed. Constitutional: She is oriented to person, place, and time. She appears well-developed. No distress.  HENT:  Head: Atraumatic.  Right Ear: Hearing, tympanic membrane, external ear and ear canal normal.  Left Ear: Hearing, tympanic membrane, external ear and ear canal normal.  Mouth/Throat: Uvula is midline, oropharynx is clear and moist and mucous membranes are normal.  Eyes: Conjunctivae and EOM are normal. Pupils are equal, round, and reactive to light.  Neck: No thyroid mass and no thyromegaly present.  Cardiovascular: Normal rate and regular rhythm.   No murmur heard. Pulses:      Dorsalis pedis pulses are 2+ on the right side, and 2+ on the left side.  Respiratory: Effort normal and breath sounds normal. No respiratory distress. She exhibits no tenderness.  GI: Soft. She exhibits no mass. There is no hepatomegaly. There is no tenderness.  Genitourinary: No breast swelling or tenderness. There is no tenderness on the right labia. There is no tenderness on the left labia. No erythema or tenderness in the vagina. No vaginal discharge found.  Genitourinary Comments: Breast: No masses or nipple discharge. Perineal lichenified skin changes, purplish lesion,  no ender, no ulcers or suspicious lesions.Reported as chronic,unchanged.  -Difficult pelvic examination due to body habits.  Musculoskeletal: She exhibits no edema.  No major deformity or signs of synovitis appreciated.  Lymphadenopathy:    She has no cervical adenopathy.    She has no axillary adenopathy.       Right: No supraclavicular adenopathy present.       Left: No supraclavicular adenopathy present.  Neurological: She is alert and oriented to person, place, and time. She has normal strength. No cranial nerve deficit. Coordination and gait normal.  Reflex Scores:      Bicep reflexes are 2+ on the right side and 2+ on the left side.      Patellar reflexes are 2+ on the right side and 2+ on the left side. Skin: Skin is warm. No rash noted. No erythema.  Psychiatric: She has a normal mood and affect. Her  speech is normal.  Well groomed, good eye contact.      ASSESSMENT AND PLAN:    Brianna Foster was seen today for annual exam and gynecologic exam.  Diagnoses and all orders for this visit:  Routine general medical examination at a health care facility   We discussed the importance of regular physical activity and healthy diet for prevention of chronic illness and/or complications. Preventive guidelines reviewed. Vaccination not up to date, prefers to hold on Tdap. Aspirin for primary prevention discussed and recommended. Ca++ and vit D supplementation recommended. Next CPE in 1 year.   Cervical cancer screening  -     PAP [[place]]    Return in 4 months (on 09/05/2016) for HTN,HLD fasting labs.       Brianna Willis G. Martinique, MD  [facility]. [place] office.

## 2016-05-08 NOTE — Patient Instructions (Signed)
A few things to remember from today's visit:   Cervical cancer screening - Plan: Cytology - PAP ([place])  Routine general medical examination at a health care facility - Plan: Cytology - PAP ([place])    At least 150 minutes of moderate exercise per week, daily brisk walking for 15-30 min is a good exercise option. Healthy diet low in saturated (animal) fats and sweets and consisting of fresh fruits and vegetables, lean meats such as fish and white chicken and whole grains.   - Vaccines:  Tdap vaccine every 10 years.  Shingles vaccine recommended at age 92, could be given after 60 years of age but not sure about insurance coverage.  Pneumonia vaccines:  Prevnar 13 at 65 and Pneumovax at 49.  Screening recommendations for low/normal risk women:  Screening for diabetes at age 67-45 and every 3 years.  Cervical cancer prevention:  -HPV vaccination between 25-84 years old. -Pap smear starts at 60 years of age and continues periodically until 60 years old in low risk women. Pap smear every 3 years between 59 and 42 years old. Pap smear every 3 years between women 54 and older if pap smear negative and HPV screening negative.   -Breast cancer: Mammogram: There is disagreement between experts about when to start screening in low risk asymptomatic female but recent recommendations are to start screening at 47 and not later than 60 years old , every 1-2 years and after 60 yo q 2 years. Screening is recommended until 60 years old but some women can continue screening depending of healthy issues.   Colon cancer screening: starts at 60 years old until 60 years old.      Please be sure medication list is accurate. If a new problem present, please set up appointment sooner than planned today.

## 2016-05-11 LAB — CYTOLOGY - PAP: DIAGNOSIS: NEGATIVE

## 2016-05-22 ENCOUNTER — Ambulatory Visit (INDEPENDENT_AMBULATORY_CARE_PROVIDER_SITE_OTHER): Payer: 59 | Admitting: Orthopaedic Surgery

## 2016-05-28 ENCOUNTER — Other Ambulatory Visit: Payer: Self-pay | Admitting: Family Medicine

## 2016-05-28 DIAGNOSIS — M62838 Other muscle spasm: Secondary | ICD-10-CM

## 2016-05-30 ENCOUNTER — Other Ambulatory Visit: Payer: Self-pay | Admitting: Family Medicine

## 2016-05-30 DIAGNOSIS — R0789 Other chest pain: Secondary | ICD-10-CM

## 2016-05-30 DIAGNOSIS — M25511 Pain in right shoulder: Secondary | ICD-10-CM

## 2016-06-28 ENCOUNTER — Other Ambulatory Visit: Payer: Self-pay | Admitting: Family Medicine

## 2016-06-28 DIAGNOSIS — M62838 Other muscle spasm: Secondary | ICD-10-CM

## 2016-08-17 ENCOUNTER — Other Ambulatory Visit: Payer: Self-pay | Admitting: Adult Health

## 2016-08-17 DIAGNOSIS — K2100 Gastro-esophageal reflux disease with esophagitis, without bleeding: Secondary | ICD-10-CM

## 2016-08-17 NOTE — Telephone Encounter (Signed)
Dr. Jordan patient 

## 2016-09-03 NOTE — Progress Notes (Deleted)
HPI:   [name] is a 61 y.o. female, who is here today to follow on some chronic medical problems.  Hypertension:    Currently on Maxzide 37.5-25 mg daily and Amlodipine 5 mg daily.    She taking medications as instructed, no side effects reported.  ***has not noted unusual headache, visual changes, exertional chest pain, dyspnea,  focal weakness, or edema.   Lab Results  Component Value Date   CREATININE 0.72 05/02/2016   BUN 10 05/02/2016   NA 137 05/02/2016   K 4.0 05/02/2016   CL 103 05/02/2016   CO2 25 05/02/2016     Hyperlipidemia:  Currently on non pharmacologic treatment. Following a low fat diet: ***.    Lab Results  Component Value Date   CHOL 230 (H) 03/20/2016   HDL 46.50 03/20/2016   LDLCALC 156 (H) 03/20/2016   LDLDIRECT 145.2 07/23/2012   TRIG 137.0 03/20/2016   CHOLHDL 5 03/20/2016      Review of Systems    Current Outpatient Prescriptions on File Prior to Visit  Medication Sig Dispense Refill  . amLODipine (NORVASC) 5 MG tablet TAKE 1 TABLET (5 MG TOTAL) BY MOUTH DAILY. 90 tablet 2  . cyclobenzaprine (FLEXERIL) 10 MG tablet Take 1 tablet (10 mg total) by mouth at bedtime. 30 tablet 0  . famotidine (PEPCID) 20 MG tablet Take 1 tablet (20 mg total) by mouth 2 (two) times daily. 30 tablet 0  . meclizine (ANTIVERT) 25 MG tablet Take 1 tablet (25 mg total) by mouth 3 (three) times daily as needed for dizziness or nausea. 30 tablet 1  . pantoprazole (PROTONIX) 40 MG tablet TAKE 1 TABLET EVERY DAY 90 tablet 1  . predniSONE (STERAPRED UNI-PAK 21 TAB) 10 MG (21) TBPK tablet Take as directed 21 tablet 0  . triamcinolone cream (KENALOG) 0.1 % Apply 1 application topically 2 (two) times daily as needed. 45 g 1  . triamterene-hydrochlorothiazide (MAXZIDE-25) 37.5-25 MG tablet Take 1 tablet by mouth daily. 90 tablet 2   No current facility-administered medications on file prior to visit.      Past Medical History:  Diagnosis  Date  . GERD (gastroesophageal reflux disease)   . Headache(784.0)   . Hypertension    No Known Allergies  Social History   Social History  . Marital status: Divorced    Spouse name: N/A  . Number of children: 1  . 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 Smoking status: Former Smoker    Types: Cigarettes    Quit date: 07/09/2014  . Smokeless tobacco: Never Used  . Alcohol use No     Comment: Occassional Use  . Drug use: No  . Sexual activity: Not Currently    Birth control/ protection: Post-menopausal   Other Topics Concern  . Not on file   Social History Narrative   Lives at home alone   Right-handed   Drinks decaf coffee    There were no vitals filed for this visit. There is no height or weight on file to calculate BMI.      Physical Exam    ASSESSMENT AND PLAN:     There are no diagnoses linked to this encounter.         -[name] was advised to return sooner than planned today if new concerns arise.  Shion Bluestein G. Martinique, MD  [facility]. [place] office.

## 2016-09-04 ENCOUNTER — Ambulatory Visit: Payer: 59 | Admitting: Family Medicine

## 2016-09-11 ENCOUNTER — Ambulatory Visit: Payer: 59 | Admitting: Family Medicine

## 2016-09-24 NOTE — Progress Notes (Deleted)
HPI:   [name] is a 61 y.o. female, who is here today to follow on some chronic medical problems.  Last OV 05/08/16 for CPE.    Hypertension:  Dx around 51 (age 62).  Currently on Maxzide 37.5-25 mg daily and Amlodipine 5 mg daily.   She taking medications as instructed, no side effects reported.  ***has not noted unusual headache, visual changes, exertional chest pain, dyspnea,  focal weakness, or edema.   Lab Results  Component Value Date   CREATININE 0.72 05/02/2016   BUN 10 05/02/2016   NA 137 05/02/2016   K 4.0 05/02/2016   CL 103 05/02/2016   CO2 25 05/02/2016     Hyperlipidemia:  Currently on non pharmacologic treatment. Following a low fat diet: ***.   Lab Results  Component Value Date   CHOL 230 (H) 03/20/2016   HDL 46.50 03/20/2016   LDLCALC 156 (H) 03/20/2016   LDLDIRECT 145.2 07/23/2012   TRIG 137.0 03/20/2016   CHOLHDL 5 03/20/2016     Review of Systems    Current Outpatient Prescriptions on File Prior to Visit  Medication Sig Dispense Refill  . amLODipine (NORVASC) 5 MG tablet TAKE 1 TABLET (5 MG TOTAL) BY MOUTH DAILY. 90 tablet 2  . cyclobenzaprine (FLEXERIL) 10 MG tablet Take 1 tablet (10 mg total) by mouth at bedtime. 30 tablet 0  . famotidine (PEPCID) 20 MG tablet Take 1 tablet (20 mg total) by mouth 2 (two) times daily. 30 tablet 0  . meclizine (ANTIVERT) 25 MG tablet Take 1 tablet (25 mg total) by mouth 3 (three) times daily as needed for dizziness or nausea. 30 tablet 1  . pantoprazole (PROTONIX) 40 MG tablet TAKE 1 TABLET EVERY DAY 90 tablet 1  . predniSONE (STERAPRED UNI-PAK 21 TAB) 10 MG (21) TBPK tablet Take as directed 21 tablet 0  . triamcinolone cream (KENALOG) 0.1 % Apply 1 application topically 2 (two) times daily as needed. 45 g 1  . triamterene-hydrochlorothiazide (MAXZIDE-25) 37.5-25 MG tablet Take 1 tablet by mouth daily. 90 tablet 2   No current facility-administered medications on file prior to  visit.      Past Medical History:  Diagnosis Date  . GERD (gastroesophageal reflux disease)   . Headache(784.0)   . Hypertension    No Known Allergies  Social History   Social History  . Marital status: Divorced    Spouse name: N/A  . Number of children: 1  . 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 Smoking status: Former Smoker    Types: Cigarettes    Quit date: 07/09/2014  . Smokeless tobacco: Never Used  . Alcohol use No     Comment: Occassional Use  . Drug use: No  . Sexual activity: Not Currently    Birth control/ protection: Post-menopausal   Other Topics Concern  . Not on file   Social History Narrative   Lives at home alone   Right-handed   Drinks decaf coffee    There were no vitals filed for this visit. There is no height or weight on file to calculate BMI.   Physical Exam    ASSESSMENT AND PLAN:     There are no diagnoses linked to this encounter.         -[name] was advised to return sooner than planned today if new concerns arise.  Jadin Kagel G. Martin[place], MD  [facility]. [place] office.

## 2016-09-25 ENCOUNTER — Ambulatory Visit: Payer: 59 | Admitting: Family Medicine

## 2016-09-25 DIAGNOSIS — Z0289 Encounter for other administrative examinations: Secondary | ICD-10-CM

## 2016-11-07 ENCOUNTER — Ambulatory Visit: Payer: 59 | Admitting: Family Medicine

## 2016-11-07 DIAGNOSIS — Z0289 Encounter for other administrative examinations: Secondary | ICD-10-CM

## 2016-12-11 ENCOUNTER — Ambulatory Visit (INDEPENDENT_AMBULATORY_CARE_PROVIDER_SITE_OTHER)
Admission: RE | Admit: 2016-12-11 | Discharge: 2016-12-11 | Disposition: A | Discharge status: Home / Self Care | Payer: 59 | Source: Ambulatory Visit | Attending: Family Medicine | Admitting: Family Medicine

## 2016-12-11 ENCOUNTER — Telehealth: Payer: Self-pay | Admitting: Family Medicine

## 2016-12-11 ENCOUNTER — Encounter: Payer: Self-pay | Admitting: Family Medicine

## 2016-12-11 ENCOUNTER — Ambulatory Visit (INDEPENDENT_AMBULATORY_CARE_PROVIDER_SITE_OTHER): Payer: 59 | Admitting: Family Medicine

## 2016-12-11 VITALS — BP 130/80 | HR 86 | Resp 12 | Ht 69.0 in | Wt 228.0 lb

## 2016-12-11 DIAGNOSIS — M25562 Pain in left knee: Secondary | ICD-10-CM

## 2016-12-11 DIAGNOSIS — W19XXXA Unspecified fall, initial encounter: Secondary | ICD-10-CM

## 2016-12-11 DIAGNOSIS — S99912A Unspecified injury of left ankle, initial encounter: Secondary | ICD-10-CM

## 2016-12-11 DIAGNOSIS — I10 Essential (primary) hypertension: Secondary | ICD-10-CM

## 2016-12-11 DIAGNOSIS — Z23 Encounter for immunization: Secondary | ICD-10-CM

## 2016-12-11 DIAGNOSIS — S91209A Unspecified open wound of unspecified toe(s) with damage to nail, initial encounter: Secondary | ICD-10-CM

## 2016-12-11 DIAGNOSIS — S91109A Unspecified open wound of unspecified toe(s) without damage to nail, initial encounter: Secondary | ICD-10-CM

## 2016-12-11 DIAGNOSIS — K2100 Gastro-esophageal reflux disease with esophagitis, without bleeding: Secondary | ICD-10-CM

## 2016-12-11 DIAGNOSIS — Y92009 Unspecified place in unspecified non-institutional (private) residence as the place of occurrence of the external cause: Secondary | ICD-10-CM

## 2016-12-11 DIAGNOSIS — R600 Localized edema: Secondary | ICD-10-CM | POA: Diagnosis not present

## 2016-12-11 MED ORDER — AMLODIPINE BESYLATE 5 MG PO TABS: ORAL_TABLET | ORAL | 2 refills | Status: DC

## 2016-12-11 MED ORDER — TRIAMTERENE-HCTZ 37.5-25 MG PO TABS: 1.0000 | ORAL_TABLET | Freq: Every day | ORAL | 2 refills | Status: DC

## 2016-12-11 MED ORDER — KETOROLAC TROMETHAMINE 60 MG/2ML IM SOLN
60.0000 mg | Freq: Once | INTRAMUSCULAR | Status: AC
  Administered 2016-12-11: 60 mg via INTRAMUSCULAR

## 2016-12-11 MED ORDER — PANTOPRAZOLE SODIUM 40 MG PO TBEC: 40.0000 mg | DELAYED_RELEASE_TABLET | Freq: Every day | ORAL | 2 refills | Status: DC

## 2016-12-11 NOTE — Telephone Encounter (Signed)
Pt would like to see if the xray results are back and to see if she can get something for pain.  Pharm:  [facility]

## 2016-12-11 NOTE — Progress Notes (Signed)
ACUTE VISIT   HPI:  Chief Complaint  Patient presents with  . Knee Pain  . Fall    [name] is a 61 y.o. female, who is here today complaining of Right knee and ankle pain after a fall at home. Last night around 9 PM, when she was walking in her garage, she slipped on a paddle of water and fell.she landed on her left side, denies head trauma. She was able to get up with no help.  Right after fall she started with left knee and ankle pain, left big toe toenail bleeding.  She has not tried any OTC medication for pain.  She cleaned toenail with alcohol.   Fall  The accident occurred 6 to 12 hours ago. The fall occurred while walking. She fell from a height of 6 to 10 ft. She landed on hard floor. The volume of blood lost was minimal. The point of impact was the left shoulder, left knee and left foot. The pain is present in the left foot and left knee. The pain is moderate. The symptoms are aggravated by ambulation and movement. Pertinent negatives include no abdominal pain, bowel incontinence, fever, headaches, hematuria, loss of consciousness, nausea, numbness, tingling or vomiting. She has tried nothing for the symptoms.   She is also requesting refill for some of her medications. Last follow-up appointment 05/08/2016.   GERD: Currently she is on Protonix 40 mg daily. Since she started medication she has not noted chest discomfort or GI symptoms. Denies abdominal pain, nausea, vomiting, changes in bowel habits, blood in stool or melena.  Hypertension:   Currently on Amlodipine 5 mg daily and Maxzide 37.5 5 mg daily.  She is taking medications as instructed, no side effects reported.  She has not noted unusual headache, visual changes, exertional chest pain, dyspnea, or focal weakness.  She is trying to eat healthier and has noted some weight loss.  Lab Results  Component Value Date   CREATININE 0.72 05/02/2016   BUN 10 05/02/2016   NA 137 05/02/2016   K  4.0 05/02/2016   CL 103 05/02/2016   CO2 25 05/02/2016     Review of Systems  Constitutional: Negative for appetite change, fatigue, fever and unexpected weight change.  HENT: Negative for mouth sores, nosebleeds and trouble swallowing.   Eyes: Negative for redness and visual disturbance.  Respiratory: Negative for cough, shortness of breath and wheezing.   Cardiovascular: Negative for chest pain, palpitations and leg swelling.  Gastrointestinal: Negative for abdominal pain, bowel incontinence, nausea and vomiting.       Negative for changes in bowel habits.  Genitourinary: Negative for decreased urine volume, dysuria and hematuria.  Musculoskeletal: Positive for arthralgias and joint swelling.  Skin: Positive for wound. Negative for rash.  Neurological: Negative for tingling, loss of consciousness, syncope, weakness, numbness and headaches.  Psychiatric/Behavioral: Negative for confusion. The patient is not nervous/anxious.       Current Outpatient Prescriptions on File Prior to Visit  Medication Sig Dispense Refill  . famotidine (PEPCID) 20 MG tablet Take 1 tablet (20 mg total) by mouth 2 (two) times daily. 30 tablet 0  . meclizine (ANTIVERT) 25 MG tablet Take 1 tablet (25 mg total) by mouth 3 (three) times daily as needed for dizziness or nausea. 30 tablet 1  . triamcinolone cream (KENALOG) 0.1 % Apply 1 application topically 2 (two) times daily as needed. 45 g 1   No current facility-administered medications on file prior to  visit.      Past Medical History:  Diagnosis Date  . GERD (gastroesophageal reflux disease)   . Headache(784.0)   . Hypertension    No Known Allergies  Social History   Social History  . Marital status: Divorced    Spouse name: N/A  . Number of children: 1  . 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 Smoking status: Former Smoker    Types: Cigarettes    Quit date:  07/09/2014  . Smokeless tobacco: Never Used  . Alcohol use No     Comment: Occassional Use  . Drug use: No  . Sexual activity: Not Currently    Birth control/ protection: Post-menopausal   Other Topics Concern  . None   Social History Narrative   Lives at home alone   Right-handed   Drinks decaf coffee    Vitals:   12/11/16 1019  BP: 130/80  Pulse: 86  Resp: 12  SpO2: 96%   Body mass index is 33.67 kg/m.  Physical Exam  Nursing note and vitals reviewed. Constitutional: She is oriented to person, place, and time. She appears well-developed. No distress.  HENT:  Head: Normocephalic and atraumatic.  Mouth/Throat: Mucous membranes are normal.  Eyes: Pupils are equal, round, and reactive to light. Conjunctivae are normal.  Cardiovascular: Normal rate and regular rhythm.   No murmur heard. Pulses:      Dorsalis pedis pulses are 2+ on the right side, and 2+ on the left side.  Respiratory: Effort normal and breath sounds normal. No respiratory distress.  GI: Soft. She exhibits no mass. There is no hepatomegaly. There is no tenderness.  Musculoskeletal: She exhibits edema (left ankle).       Left knee: She exhibits decreased range of motion and effusion. She exhibits no deformity, no bony tenderness and no MCL laxity. Tenderness found. Medial joint line tenderness noted. No patellar tendon tenderness noted.       Left ankle: She exhibits normal range of motion, no ecchymosis and no deformity. Tenderness. Medial malleolus tenderness found. Achilles tendon exhibits normal Thompson's test results.  Left knee: on inspection mild effusion, no erythema or deformities. Valgus and varus stress normal, anterior and posterior drawer test negative. Patellar and medial inter articular line palpation elicits pain.  Left ankle medial malleolus edema and mild tenderness above malleolus.  Minimal limitation of flexion left knee, related to pain. Tenderness upon palpation of PIP joint left  hallux, no deformity.   Lymphadenopathy:    She has no cervical adenopathy.  Neurological: She is alert and oriented to person, place, and time. She has normal strength. Coordination normal.  Stable ,antalgig gait with no assistance needed.   Skin: Skin is warm. Abrasion and ecchymosis (on anterior aspect left knee.) noted. No rash noted. No erythema.  Superficial excoriation under left knee, 1.2 cm, no active bleeding. Left toenail avulsion, still attached to proximal aspect nail bed. Mild edema.   Psychiatric: She has a normal mood and affect.  Well groomed, good eye contact.   ASSESSMENT AND PLAN:   [name] was seen today for knee pain and fall.  Diagnoses and all orders for this visit:  Avulsion of toenail, initial encounter  Explained that toenail will eventually fall. I taped distal toenail to toe keep it in place, recommend open shoe to avoid trauma. Keep it clean with soap and water, recommend soaking toenail in warm  water and vinegar when she gets home. Monitor for signs of infection. Podiatry evaluation if needed in 1-2 weeks.  -     DG Foot Complete Left; Future  Fall in home, initial encounter  Fall precautions discussed.  -     DG Foot Complete Left; Future  Acute pain of left knee  Stable knee on exam today. After verbal consent and discussion of some side effects, she received Toradol 60 mg IM. Further recommendations would be given according to imaging results.  -     DG Knee Complete 4 Views Left; Future -     ketorolac (TORADOL) injection 60 mg; Inject 2 mLs (60 mg total) into the muscle once.  Essential hypertension  Adequately controlled. No changes in current management. Low salt diet to continue. Eye exam annually. F/U in 6 months, before if needed.  -     amLODipine (NORVASC) 5 MG tablet; TAKE 1 TABLET (5 MG TOTAL) BY MOUTH DAILY. -     triamterene-hydrochlorothiazide (MAXZIDE-25) 37.5-25 MG tablet; Take 1 tablet by mouth  daily.  Injury of left ankle, initial encounter  Most likely ankle sprain. Local ice and elevation recommended. Further recommendations would be given according to imaging results.  -     DG Ankle Complete Left; Future  Gastroesophageal reflux disease with esophagitis  Stable. No changes in current management. GERD precautions to continue. F/U in 6-12 months.  -     pantoprazole (PROTONIX) 40 MG tablet; Take 1 tablet (40 mg total) by mouth daily.  Need for Tdap vaccination -     Tdap vaccine greater than or equal to 7yo IM     Return in about 6 months (around 06/10/2017) for HTN.     -[name] was advised to seek immediate medical attention if sudden worsening symptoms or to follow if they persist or if new concerns arise.       Betty G. Martinique, MD  [facility]. [place] office.

## 2016-12-11 NOTE — Patient Instructions (Signed)
A few things to remember from today's visit:   Essential hypertension - Plan: amLODipine (NORVASC) 5 MG tablet, triamterene-hydrochlorothiazide (MAXZIDE-25) 37.5-25 MG tablet  Fall in home, initial encounter - Plan: DG Foot Complete Left  Acute pain of left knee - Plan: DG Knee Complete 4 Views Left  Avulsion of toenail, initial encounter - Plan: DG Foot Complete Left  Injury of left ankle, initial encounter - Plan: DG Ankle Complete Left  Gastroesophageal reflux disease with esophagitis - Plan: pantoprazole (PROTONIX) 40 MG tablet  Local ice,elevation, and relative rest.   Please be sure medication list is accurate. If a new problem present, please set up appointment sooner than planned today.

## 2016-12-12 NOTE — Telephone Encounter (Signed)
I went over x-ray results with patient. I advised her to keep icing and to keep her foot elevated, and to keep weight off of it as much as she can. I also advised her that she should not need a referral to see a podiatrist, but to let me know if she needs one.

## 2016-12-12 NOTE — Telephone Encounter (Signed)
Pt is calling she is needing the referral to the podiatrist.

## 2016-12-12 NOTE — Telephone Encounter (Signed)
Referral placed  Thanks!

## 2016-12-14 ENCOUNTER — Telehealth: Payer: Self-pay | Admitting: Family Medicine

## 2016-12-14 ENCOUNTER — Other Ambulatory Visit: Payer: Self-pay | Admitting: Family Medicine

## 2016-12-14 MED ORDER — CELECOXIB 100 MG PO CAPS: 100.0000 mg | ORAL_CAPSULE | Freq: Two times a day (BID) | ORAL | 0 refills | Status: AC

## 2016-12-14 NOTE — Telephone Encounter (Signed)
I left a voicemail for patient letting her know the Rx was sent to her pharmacy.

## 2016-12-14 NOTE — Telephone Encounter (Signed)
Rx for Celebrex 100 mg bid to take for up to 15 days was sent to her pharmacy.  Thanks, BJ

## 2016-12-14 NOTE — Telephone Encounter (Signed)
° ° °  Pt said she is still in a lot of pain and is asking if some pain med can be called in until she can see the foot doctor.   [facility] [address]

## 2016-12-24 DIAGNOSIS — S90122A Contusion of left lesser toe(s) without damage to nail, initial encounter: Secondary | ICD-10-CM | POA: Diagnosis not present

## 2016-12-24 DIAGNOSIS — M79672 Pain in left foot: Secondary | ICD-10-CM | POA: Diagnosis not present

## 2017-01-13 ENCOUNTER — Other Ambulatory Visit: Payer: Self-pay | Admitting: Family Medicine

## 2017-01-13 DIAGNOSIS — M62838 Other muscle spasm: Secondary | ICD-10-CM

## 2017-01-16 ENCOUNTER — Other Ambulatory Visit: Payer: Self-pay | Admitting: Family Medicine

## 2017-01-16 DIAGNOSIS — I10 Essential (primary) hypertension: Secondary | ICD-10-CM

## 2017-01-16 NOTE — Telephone Encounter (Signed)
I prescribed short course of Flexeril in 04/2016 , now she is following with orthopedist.  Thanks, BJ

## 2017-01-22 ENCOUNTER — Other Ambulatory Visit: Payer: Self-pay | Admitting: Family Medicine

## 2017-01-22 DIAGNOSIS — I10 Essential (primary) hypertension: Secondary | ICD-10-CM

## 2017-01-23 ENCOUNTER — Telehealth: Payer: Self-pay | Admitting: Family Medicine

## 2017-01-23 DIAGNOSIS — I10 Essential (primary) hypertension: Secondary | ICD-10-CM

## 2017-01-23 MED ORDER — TRIAMTERENE-HCTZ 37.5-25 MG PO TABS: 1.0000 | ORAL_TABLET | Freq: Every day | ORAL | 2 refills | Status: DC

## 2017-01-23 MED ORDER — AMLODIPINE BESYLATE 5 MG PO TABS: ORAL_TABLET | ORAL | 2 refills | Status: DC

## 2017-01-23 NOTE — Telephone Encounter (Signed)
Pt needs refills on amlodipine and triamterene-hctz [facility] [address]

## 2017-01-23 NOTE — Telephone Encounter (Signed)
Rxs sent

## 2017-05-08 ENCOUNTER — Ambulatory Visit: Payer: 59 | Admitting: Family Medicine

## 2017-05-08 ENCOUNTER — Encounter: Payer: Self-pay | Admitting: Family Medicine

## 2017-05-08 VITALS — BP 126/72 | HR 70 | Temp 98.6°F | Resp 16 | Ht 69.0 in | Wt 218.5 lb

## 2017-05-08 DIAGNOSIS — K219 Gastro-esophageal reflux disease without esophagitis: Secondary | ICD-10-CM

## 2017-05-08 DIAGNOSIS — H8110 Benign paroxysmal vertigo, unspecified ear: Secondary | ICD-10-CM | POA: Diagnosis not present

## 2017-05-08 DIAGNOSIS — R053 Chronic cough: Secondary | ICD-10-CM

## 2017-05-08 DIAGNOSIS — R05 Cough: Secondary | ICD-10-CM

## 2017-05-08 DIAGNOSIS — E785 Hyperlipidemia, unspecified: Secondary | ICD-10-CM | POA: Diagnosis not present

## 2017-05-08 DIAGNOSIS — G479 Sleep disorder, unspecified: Secondary | ICD-10-CM

## 2017-05-08 DIAGNOSIS — I10 Essential (primary) hypertension: Secondary | ICD-10-CM

## 2017-05-08 LAB — BASIC METABOLIC PANEL
BUN: 14 mg/dL (ref 6–23)
CHLORIDE: 103 meq/L (ref 96–112)
CO2: 30 mEq/L (ref 19–32)
Calcium: 9.3 mg/dL (ref 8.4–10.5)
Creatinine, Ser: 0.8 mg/dL (ref 0.40–1.20)
GFR: 94.03 mL/min (ref >60.00)
GLUCOSE: 103 mg/dL — AB (ref 70–99)
POTASSIUM: 4.3 meq/L (ref 3.5–5.1)
Sodium: 141 mEq/L (ref 135–145)

## 2017-05-08 LAB — LIPID PANEL
Cholesterol: 225 mg/dL — ABNORMAL HIGH (ref 0–200)
HDL: 54.9 mg/dL (ref >39.00)
LDL CALC: 153 mg/dL — AB (ref 0–99)
NONHDL: 170.56
Total CHOL/HDL Ratio: 4
Triglycerides: 89 mg/dL (ref 0.0–149.0)
VLDL: 17.8 mg/dL (ref 0.0–40.0)

## 2017-05-08 MED ORDER — ALBUTEROL SULFATE HFA 108 (90 BASE) MCG/ACT IN AERS: 2.0000 | INHALATION_SPRAY | Freq: Four times a day (QID) | RESPIRATORY_TRACT | 0 refills | Status: DC | PRN

## 2017-05-08 MED ORDER — FLUTICASONE PROPIONATE 50 MCG/ACT NA SUSP: 1.0000 | Freq: Two times a day (BID) | NASAL | 3 refills | Status: DC

## 2017-05-08 MED ORDER — MECLIZINE HCL 25 MG PO TABS: 25.0000 mg | ORAL_TABLET | Freq: Three times a day (TID) | ORAL | 2 refills | Status: DC | PRN

## 2017-05-08 NOTE — Patient Instructions (Signed)
A few things to remember from today's visit:   Persistent cough - Plan: DG Chest 2 View  Vertigo - Plan: meclizine (ANTIVERT) 25 MG tablet  Gastroesophageal reflux disease, esophagitis presence not specified  Sleep disorder, unspecified - Plan: Ambulatory referral to Pulmonology  Essential hypertension - Plan: Basic metabolic panel, meclizine (ANTIVERT) 25 MG tablet  Albuterol inh 2 puff every 6 hours for a week then as needed for wheezing or shortness of breath.   Today X ray was ordered.  This can be done at [facility] between 8 am and 5 pm: [place]. [phone].   Please be sure medication list is accurate. If a new problem present, please set up appointment sooner than planned today.

## 2017-05-08 NOTE — Assessment & Plan Note (Signed)
For now continue low-fat diet. Further recommendations will be given according to lab results.

## 2017-05-08 NOTE — Assessment & Plan Note (Deleted)
Stable overall. Fall precautions discussed. Continue Meclizine 25 mg daily as needed, side effect discussed. Instructed about warning signs. Follow-up as needed.

## 2017-05-08 NOTE — Assessment & Plan Note (Signed)
Adequately controlled. No changes in current management. DASH-Low-salt diet to continue. Eye exam recommended annually. F/U in 6 months, before if needed.

## 2017-05-08 NOTE — Progress Notes (Signed)
ACUTE VISIT  HPI:  Chief Complaint  Patient presents with  . Cough    started 2 weeks ago  . Dizziness    started 1 week ago    Brianna Foster is a 62 y.o.female here today complaining of 2 weeks of productive cough and intermittent wheezing. Minimal sputum, she denies hemoptysis. Cough seems to be worse at night when laying down.  She denies history of asthma. +Smoker. In the past she remembers using an inhaler. She is concerned about having  "walking pneumonia." She denies fever, chills, body aches, or dyspnea. No orthopnea or PND.   Cough  This is a new problem. The current episode started 1 to 4 weeks ago. The problem has been unchanged. The cough is productive of sputum. Associated symptoms include ear congestion, headaches, nasal congestion, postnasal drip, rhinorrhea and wheezing. Pertinent negatives include no chest pain, chills, ear pain, eye redness, fever, heartburn, hemoptysis, myalgias, rash, sore throat or shortness of breath. The symptoms are aggravated by lying down. Risk factors for lung disease include smoking/tobacco exposure. She has tried nothing for the symptoms. Her past medical history is significant for environmental allergies.     No Hx of recent travel. + Sick contact: Coworkers have been sick. No known insect bite.  Hx of allergies: Yes. History of GERD, she is currently on Protonix.  She still has occasional symptoms but in general it has improved with PPI. Occasional burping.  Symptoms otherwise stable.    "I think I need a sleep study": For a while she has had intermittent lightheadedness, usually in the morning.  She thinks she may have OSA.  Frontal morning headaches and she does not feel rested when she first gets up.  She also states that an ex-boyfriend mentioned to her that she was stopping breathing in the middle of the night,looks like she was "chocking."  She denies falling asleep while driving but a few days ago she  felt like she was going to "faint" when she was driving. She denies episode of syncope.   Hypertension: Currently she is on Amlodipine 5 mg daily and 37.5-25 mg daily.  Lab Results  Component Value Date   CREATININE 0.72 05/02/2016   BUN 10 05/02/2016   NA 137 05/02/2016   K 4.0 05/02/2016   CL 103 05/02/2016   CO2 25 05/02/2016   She is also requesting refill for Meclizine, years history of vertigo.  "Spinning" sensation exacerbated by laying down and head movement, alleviated by being still.  She has been evaluated by ENT and has completed vestibular therapy, which did not help.  Intermittent tinnitus, she denies hearing loss or associated nausea or vomiting.  Meclizine 25 mg as needed helps.  Hyperlipidemia:  She is also requesting lipid panel done today. Currently on nonpharmacologic treatment. Following a low fat diet: Yes. She has lost some weight through a healthy diet, she is not exercising regularly.  Lab Results  Component Value Date   CHOL 230 (H) 03/20/2016   HDL 46.50 03/20/2016   LDLCALC 156 (H) 03/20/2016   LDLDIRECT 145.2 07/23/2012   TRIG 137.0 03/20/2016   CHOLHDL 5 03/20/2016     Review of Systems  Constitutional: Positive for fatigue. Negative for activity change, appetite change, chills and fever.  HENT: Positive for congestion, postnasal drip, rhinorrhea, sinus pressure and tinnitus. Negative for ear pain, hearing loss, mouth sores, sore throat, trouble swallowing and voice change.   Eyes: Negative for discharge, redness and itching.  Respiratory: Positive for cough and wheezing. Negative for hemoptysis and shortness of breath.   Cardiovascular: Negative for chest pain and leg swelling.  Gastrointestinal: Negative for abdominal pain, diarrhea, heartburn, nausea and vomiting.  Endocrine: Negative for cold intolerance and heat intolerance.  Musculoskeletal: Negative for gait problem, myalgias and neck pain.  Skin: Negative for rash.    Allergic/Immunologic: Positive for environmental allergies.  Neurological: Positive for dizziness and headaches. Negative for syncope and weakness.  Hematological: Negative for adenopathy.  Psychiatric/Behavioral: Negative for confusion. The patient is nervous/anxious.       Current Outpatient Medications on File Prior to Visit  Medication Sig Dispense Refill  . amLODipine (NORVASC) 5 MG tablet TAKE 1 TABLET (5 MG TOTAL) BY MOUTH DAILY. 90 tablet 2  . famotidine (PEPCID) 20 MG tablet Take 1 tablet (20 mg total) by mouth 2 (two) times daily. 30 tablet 0  . pantoprazole (PROTONIX) 40 MG tablet Take 1 tablet (40 mg total) by mouth daily. 90 tablet 2  . triamcinolone cream (KENALOG) 0.1 % Apply 1 application topically 2 (two) times daily as needed. 45 g 1  . triamterene-hydrochlorothiazide (MAXZIDE-25) 37.5-25 MG tablet Take 1 tablet by mouth daily. 90 tablet 2   No current facility-administered medications on file prior to visit.      Past Medical History:  Diagnosis Date  . GERD (gastroesophageal reflux disease)   . Headache(784.0)   . Hypertension    No Known Allergies  Social History   Socioeconomic History  . Marital status: Divorced    Spouse name: None  . Number of children: 1  . Years of education: College  . Highest education level: None  Social Needs  . Financial resource strain: None  . Food insecurity - worry: None  . Food insecurity - inability: None  . Transportation needs - medical: None  . Transportation needs - non-medical: None  Occupational History    Comment: Glass blower/designer  Tobacco Use  . Smoking status: Current Every Day Smoker    Types: Cigarettes    Last attempt to quit: 07/09/2014    Years since quitting: 2.8  . Smokeless tobacco: Never Used  Substance and Sexual Activity  . Alcohol use: No    Comment: Occassional Use  . Drug use: No  . Sexual activity: Not Currently    Birth control/protection: Post-menopausal  Other Topics Concern  .  None  Social History Narrative   Lives at home alone   Right-handed   Drinks decaf coffee    Vitals:   05/08/17 0710  BP: 126/72  Pulse: 70  Resp: 16  Temp: 98.6 F (37 C)  SpO2: 100%   Body mass index is 32.27 kg/m.   Physical Exam  Nursing note and vitals reviewed. Constitutional: She is oriented to person, place, and time. She appears well-developed. She does not appear ill. No distress.  HENT:  Head: Normocephalic and atraumatic.  Right Ear: Tympanic membrane, external ear and ear canal normal.  Left Ear: Tympanic membrane, external ear and ear canal normal.  Nose: Rhinorrhea present. Right sinus exhibits no maxillary sinus tenderness and no frontal sinus tenderness. Left sinus exhibits no maxillary sinus tenderness and no frontal sinus tenderness.  Mouth/Throat: Oropharynx is clear and moist and mucous membranes are normal.  Hypertrophic turbinates. Postnasal drainage.  Eyes: Conjunctivae and EOM are normal. Pupils are equal, round, and reactive to light.  Neck: No muscular tenderness present. No edema and no erythema present.  Cardiovascular: Normal rate and regular rhythm.  No  murmur heard. Respiratory: Effort normal and breath sounds normal. No stridor. No respiratory distress.  GI: Soft. She exhibits no mass. There is no hepatomegaly. There is no tenderness.  Musculoskeletal: She exhibits no edema or tenderness.  Lymphadenopathy:       Head (right side): No submandibular adenopathy present.       Head (left side): No submandibular adenopathy present.    She has no cervical adenopathy.  Neurological: She is alert and oriented to person, place, and time. She has normal strength. Gait normal.  Skin: Skin is warm. No rash noted. No erythema.  Psychiatric: Her mood appears anxious.  Well groomed, good eye contact.    ASSESSMENT AND PLAN:  Brianna Foster was seen today for cough and dizziness.  Diagnoses and all orders for this visit:  Persistent cough -     DG  Chest 2 View; Future  Gastroesophageal reflux disease, esophagitis presence not specified  Sleep disorder, unspecified -     Ambulatory referral to Pulmonology  Essential hypertension -     Basic metabolic panel  Hyperlipidemia, unspecified hyperlipidemia type -     Lipid panel  Benign paroxysmal positional vertigo, unspecified laterality -     meclizine (ANTIVERT) 25 MG tablet; Take 1 tablet (25 mg total) by mouth 3 (three) times daily as needed for dizziness.  Other orders -     albuterol (PROVENTIL HFA;VENTOLIN HFA) 108 (90 Base) MCG/ACT inhaler; Inhale 2 puffs into the lungs every 6 (six) hours as needed for wheezing or shortness of breath.    We discussed possible causes of cough, including preceding symptoms after URI, GERD, allergies mentioned. Lung auscultation today negative, I do not appreciate wheezing.  Recommend Albuterol inh 2 puff every 6 hours for a week then as needed for wheezing or shortness of breath.  Because she is concerned about possibility of pneumonia,CXR was ordered. Instructed about warning signs.   Referral to pulmonology placed to evaluate for OSA as requested. Flonase nasal spray may help with nasal congestion.   -Brianna Foster advised to seek attention immediately if symptoms worsen or to follow if they persist or new concerns arise.       Betty G. Martinique, MD  [facility]. [place] office.

## 2017-05-08 NOTE — Assessment & Plan Note (Signed)
Stable overall. Fall precautions discussed. Continue Meclizine 25 mg daily as needed, side effect discussed. Instructed about warning signs. Follow-up as needed.

## 2017-05-08 NOTE — Assessment & Plan Note (Signed)
Improved with Protonix. This problem could be also contributing to her cough. GERD precautions discussed. No changes in current management.

## 2017-05-14 ENCOUNTER — Telehealth: Payer: Self-pay | Admitting: Family Medicine

## 2017-05-14 ENCOUNTER — Encounter: Payer: Self-pay | Admitting: Family Medicine

## 2017-05-14 NOTE — Telephone Encounter (Signed)
Results have not been reviewed by provider. Please contact patient with results once they are available.

## 2017-05-14 NOTE — Telephone Encounter (Signed)
Copied from [place]. Topic: Quick Communication - See Telephone Encounter >> May 14, 2017 11:00 AM Bea Graff, NT wrote: CRM for notification. See Telephone encounter for: Pt calling to get lab results and to have them released on to her Mychart.  05/14/17.

## 2017-05-14 NOTE — Telephone Encounter (Signed)
-  I sent results through My Chart. Cholesterol still elevated. I am recommending Atorvastatin 20 mg daily with supper. F/U in 4-5 months.  Thanks, BJ

## 2017-05-15 ENCOUNTER — Other Ambulatory Visit: Payer: Self-pay | Admitting: *Deleted

## 2017-05-15 ENCOUNTER — Telehealth: Payer: Self-pay | Admitting: Family Medicine

## 2017-05-15 MED ORDER — ATORVASTATIN CALCIUM 20 MG PO TABS
20.0000 mg | ORAL_TABLET | Freq: Every day | ORAL | 1 refills | Status: DC
Start: 2017-05-15 — End: 2020-08-05

## 2017-05-15 NOTE — Telephone Encounter (Signed)
Spoke with patient, went over labs and instructions. Patient verbalized understanding. Rx sent to pharmacy.

## 2017-05-15 NOTE — Telephone Encounter (Signed)
Pt. Called asking about whooping cough - signs and symptoms. Feels like a co-worker may it.

## 2017-05-15 NOTE — Telephone Encounter (Signed)
Patient informed of labs through [place]. Left message for patient to give clinic a call concerning starting Atorvastatin 20 mg.

## 2017-05-24 ENCOUNTER — Encounter: Payer: Self-pay | Admitting: Pulmonary Disease

## 2017-05-24 ENCOUNTER — Ambulatory Visit (INDEPENDENT_AMBULATORY_CARE_PROVIDER_SITE_OTHER): Payer: 59 | Admitting: Pulmonary Disease

## 2017-05-24 VITALS — BP 126/80 | HR 87 | Ht 69.0 in | Wt 220.0 lb

## 2017-05-24 DIAGNOSIS — R29818 Other symptoms and signs involving the nervous system: Secondary | ICD-10-CM | POA: Diagnosis not present

## 2017-05-24 NOTE — Progress Notes (Signed)
   Subjective:    Patient ID: Brianna Foster, female    DOB: 1955/10/07, 62 y.o.   MRN: [id number]  HPI    Review of Systems  Constitutional: Negative for fever and unexpected weight change.  HENT: Positive for sinus pressure. Negative for congestion, dental problem, ear pain, nosebleeds, postnasal drip, rhinorrhea, sneezing, sore throat and trouble swallowing.   Eyes: Negative for redness and itching.  Respiratory: Positive for shortness of breath and wheezing. Negative for cough and chest tightness.   Cardiovascular: Negative for palpitations and leg swelling.  Gastrointestinal: Negative for nausea and vomiting.  Genitourinary: Negative for dysuria.  Musculoskeletal: Negative for joint swelling.  Skin: Negative for rash.  Allergic/Immunologic: Negative.  Negative for environmental allergies, food allergies and immunocompromised state.  Neurological: Positive for headaches.  Hematological: Does not bruise/bleed easily.  Psychiatric/Behavioral: Negative for dysphoric mood. The patient is not nervous/anxious.        Objective:   Physical Exam        Assessment & Plan:

## 2017-05-24 NOTE — Progress Notes (Signed)
[facility]  Chief Complaint  Patient presents with  . sleep consult    Pt referred by Dr. Martinique MD. Pt wakes up 2-3 times each night, pt wakes up not breathing well and feels fatigued. Pt has some wheezing, snores loudly, stops breathing at times during her sleep.    Vital signs: BP 126/80 (BP Location: Left Arm, Cuff Size: Normal)   Pulse 87   Ht 5\' 9"  (1.753 m)   Wt 220 lb (99.8 kg)   LMP 01/07/2010 (LMP Unknown)   SpO2 100%   BMI 32.49 kg/m   History of Present Illness: Brianna Foster is a 62 y.o. female for evaluation of sleep problems.  She has been concerned about her sleep for a while.  This has been getting worse.  She snores, and will wake up feeling like she can't breath.  She has trouble sleeping on her back.  She wakes up frequently in the middle of dreams.  Her mouth gets dry at night.  She has trouble staying focused at times during the day.  She goes to sleep at 10 pm.  She falls asleep after 30 minutes.  She wakes up 2 or 3 times to use the bathroom.  She gets out of bed at 645 am.  She feels tired in the morning.  She gets morning headache several times per week.  She does not use anything to help her stay awake.  She will take an OTC sleep aide occasionally.  She does get leg cramps about once per week.  She denies sleep walking, sleep talking, bruxism, or nightmares.  There is no history of restless legs.  She denies sleep hallucinations, sleep paralysis, or cataplexy.  The Epworth score is 10 out of 24.   Physical Exam:  General - pleasant Eyes - pupils reactive ENT - no sinus tenderness, no oral exudate, no LAN, MP 3, 2+ tonsils, extensive dental work Cardiac - regular, no murmur Chest - no wheeze, rales Abd - soft, non tender Ext - no edema Skin - no rashes Neuro - normal strength Psych - normal mood  Discussion: She has snoring, sleep disruption, daytime sleepiness and witnessed apnea.  She has history of  HTN and gets frequent headaches.  I am concerned she could have sleep apnea.  We discussed how sleep apnea can affect various health problems, including risks for hypertension, cardiovascular disease, and diabetes.  We also discussed how sleep disruption can increase risks for accidents, such as while driving.  Weight loss as a means of improving sleep apnea was also reviewed.  Additional treatment options discussed were CPAP therapy, oral appliance, and surgical intervention.  She expressed concerns about claustrophobia and ability to wear a CPAP mask if she is found to have sleep apnea.  Assessment/Plan:  Suspected sleep apnea. - will arrange for home sleep study   Patient Instructions  Will arrange for home sleep study Will call to arrange for follow up after sleep study reviewed    Chesley Mires, MD [place] 05/24/2017, 4:48 PM Pager:  [phone]  Flow Sheet  Sleep tests:  Review of Systems: Constitutional: Negative for fever and unexpected weight change.  HENT: Positive for sinus pressure. Negative for congestion, dental problem, ear pain, nosebleeds, postnasal drip, rhinorrhea, sneezing, sore throat and trouble swallowing.   Eyes: Negative for redness and itching.  Respiratory: Positive for shortness of breath and wheezing. Negative for cough and chest tightness.   Cardiovascular: Negative for palpitations and  leg swelling.  Gastrointestinal: Negative for nausea and vomiting.  Genitourinary: Negative for dysuria.  Musculoskeletal: Negative for joint swelling.  Skin: Negative for rash.  Allergic/Immunologic: Negative.  Negative for environmental allergies, food allergies and immunocompromised state.  Neurological: Positive for headaches.  Hematological: Does not bruise/bleed easily.  Psychiatric/Behavioral: Negative for dysphoric mood. The patient is not nervous/anxious.    Past Medical History: She  has a past medical history of GERD  (gastroesophageal reflux disease), Headache(784.0), and Hypertension.  Past Surgical History: She  has a past surgical history that includes neck fusion and left heart catheterization with coronary angiogram (N/A, 05/11/2013).  Family History: Her family history includes Breast cancer in her sister; Esophageal cancer in her brother; Heart disease in her brother; Lung cancer in her mother; Stomach cancer in her father.  Social History: She  reports that she has been smoking cigarettes.  She has a 4.00 pack-year smoking history. she has never used smokeless tobacco. She reports that she does not drink alcohol or use drugs.  Medications: Allergies as of 05/24/2017   No Known Allergies     Medication List        Accurate as of 05/24/17  4:48 PM. Always use your most recent med list.          albuterol 108 (90 Base) MCG/ACT inhaler Commonly known as:  PROVENTIL HFA;VENTOLIN HFA Inhale 2 puffs into the lungs every 6 (six) hours as needed for wheezing or shortness of breath.   amLODipine 5 MG tablet Commonly known as:  NORVASC TAKE 1 TABLET (5 MG TOTAL) BY MOUTH DAILY.   atorvastatin 20 MG tablet Commonly known as:  LIPITOR Take 1 tablet (20 mg total) by mouth daily.   famotidine 20 MG tablet Commonly known as:  PEPCID Take 1 tablet (20 mg total) by mouth 2 (two) times daily.   fluticasone 50 MCG/ACT nasal spray Commonly known as:  FLONASE Place 1 spray into both nostrils 2 (two) times daily.   meclizine 25 MG tablet Commonly known as:  ANTIVERT Take 1 tablet (25 mg total) by mouth 3 (three) times daily as needed for dizziness.   pantoprazole 40 MG tablet Commonly known as:  PROTONIX Take 1 tablet (40 mg total) by mouth daily.   triamcinolone cream 0.1 % Commonly known as:  KENALOG Apply 1 application topically 2 (two) times daily as needed.   triamterene-hydrochlorothiazide 37.5-25 MG tablet Commonly known as:  MAXZIDE-25 Take 1 tablet by mouth daily.

## 2017-05-24 NOTE — Patient Instructions (Signed)
Will arrange for home sleep study Will call to arrange for follow up after sleep study reviewed  

## 2017-05-29 ENCOUNTER — Other Ambulatory Visit: Payer: Self-pay | Admitting: Family Medicine

## 2017-05-29 DIAGNOSIS — Z1231 Encounter for screening mammogram for malignant neoplasm of breast: Secondary | ICD-10-CM

## 2017-06-18 ENCOUNTER — Ambulatory Visit
Admission: RE | Admit: 2017-06-18 | Discharge: 2017-06-18 | Disposition: A | Discharge status: Home / Self Care | Payer: 59 | Source: Ambulatory Visit | Attending: Family Medicine | Admitting: Family Medicine

## 2017-06-18 ENCOUNTER — Ambulatory Visit: Payer: 59

## 2017-06-18 DIAGNOSIS — Z1231 Encounter for screening mammogram for malignant neoplasm of breast: Secondary | ICD-10-CM | POA: Diagnosis not present

## 2017-06-20 ENCOUNTER — Other Ambulatory Visit: Payer: Self-pay | Admitting: Family Medicine

## 2017-06-20 DIAGNOSIS — K2100 Gastro-esophageal reflux disease with esophagitis, without bleeding: Secondary | ICD-10-CM

## 2017-07-09 DIAGNOSIS — R112 Nausea with vomiting, unspecified: Secondary | ICD-10-CM | POA: Diagnosis not present

## 2017-07-09 DIAGNOSIS — R109 Unspecified abdominal pain: Secondary | ICD-10-CM | POA: Diagnosis not present

## 2017-07-11 ENCOUNTER — Emergency Department (HOSPITAL_BASED_OUTPATIENT_CLINIC_OR_DEPARTMENT_OTHER): Payer: 59

## 2017-07-11 ENCOUNTER — Ambulatory Visit (HOSPITAL_COMMUNITY)
Admission: EM | Disposition: A | Discharge status: Home / Self Care | Payer: Self-pay | Source: Home / Self Care | Attending: Physician Assistant

## 2017-07-11 ENCOUNTER — Other Ambulatory Visit: Payer: Self-pay

## 2017-07-11 ENCOUNTER — Observation Stay (HOSPITAL_BASED_OUTPATIENT_CLINIC_OR_DEPARTMENT_OTHER)
Admission: EM | Admit: 2017-07-11 | Discharge: 2017-07-12 | Disposition: A | Discharge status: Home / Self Care | Payer: 59 | Attending: Medical | Admitting: Medical

## 2017-07-11 ENCOUNTER — Encounter (HOSPITAL_BASED_OUTPATIENT_CLINIC_OR_DEPARTMENT_OTHER): Payer: Self-pay

## 2017-07-11 DIAGNOSIS — R9431 Abnormal electrocardiogram [ECG] [EKG]: Secondary | ICD-10-CM | POA: Insufficient documentation

## 2017-07-11 DIAGNOSIS — E876 Hypokalemia: Secondary | ICD-10-CM | POA: Diagnosis not present

## 2017-07-11 DIAGNOSIS — I447 Left bundle-branch block, unspecified: Secondary | ICD-10-CM | POA: Insufficient documentation

## 2017-07-11 DIAGNOSIS — R0602 Shortness of breath: Secondary | ICD-10-CM | POA: Diagnosis not present

## 2017-07-11 DIAGNOSIS — I200 Unstable angina: Secondary | ICD-10-CM | POA: Diagnosis not present

## 2017-07-11 DIAGNOSIS — K219 Gastro-esophageal reflux disease without esophagitis: Secondary | ICD-10-CM | POA: Insufficient documentation

## 2017-07-11 DIAGNOSIS — R0601 Orthopnea: Secondary | ICD-10-CM | POA: Insufficient documentation

## 2017-07-11 DIAGNOSIS — R197 Diarrhea, unspecified: Secondary | ICD-10-CM | POA: Diagnosis not present

## 2017-07-11 DIAGNOSIS — Z7951 Long term (current) use of inhaled steroids: Secondary | ICD-10-CM | POA: Diagnosis not present

## 2017-07-11 DIAGNOSIS — R0789 Other chest pain: Secondary | ICD-10-CM | POA: Diagnosis not present

## 2017-07-11 DIAGNOSIS — R079 Chest pain, unspecified: Secondary | ICD-10-CM | POA: Diagnosis not present

## 2017-07-11 DIAGNOSIS — E785 Hyperlipidemia, unspecified: Secondary | ICD-10-CM | POA: Diagnosis not present

## 2017-07-11 DIAGNOSIS — I10 Essential (primary) hypertension: Secondary | ICD-10-CM | POA: Insufficient documentation

## 2017-07-11 DIAGNOSIS — F17210 Nicotine dependence, cigarettes, uncomplicated: Secondary | ICD-10-CM | POA: Diagnosis not present

## 2017-07-11 HISTORY — PX: LEFT HEART CATH AND CORONARY ANGIOGRAPHY: CATH118249

## 2017-07-11 LAB — CBC WITH DIFFERENTIAL/PLATELET
BASOS ABS: 0 10*3/uL (ref 0.0–0.1)
Basophils Relative: 0 %
EOS ABS: 0.1 10*3/uL (ref 0.0–0.7)
Eosinophils Relative: 1 %
HCT: 41.2 % (ref 36.0–46.0)
Hemoglobin: 14.1 g/dL (ref 12.0–15.0)
LYMPHS PCT: 21 %
Lymphs Abs: 1.4 10*3/uL (ref 0.7–4.0)
MCH: 30.5 pg (ref 26.0–34.0)
MCHC: 34.2 g/dL (ref 30.0–36.0)
MCV: 89.2 fL (ref 78.0–100.0)
Monocytes Absolute: 1.1 10*3/uL — ABNORMAL HIGH (ref 0.1–1.0)
Monocytes Relative: 17 %
Neutro Abs: 4.1 10*3/uL (ref 1.7–7.7)
Neutrophils Relative %: 61 %
PLATELETS: 259 10*3/uL (ref 150–400)
RBC: 4.62 MIL/uL (ref 3.87–5.11)
RDW: 12.5 % (ref 11.5–15.5)
WBC: 6.7 10*3/uL (ref 4.0–10.5)

## 2017-07-11 LAB — COMPREHENSIVE METABOLIC PANEL
ALT: 25 U/L (ref 14–54)
AST: 27 U/L (ref 15–41)
Albumin: 4 g/dL (ref 3.5–5.0)
Alkaline Phosphatase: 73 U/L (ref 38–126)
Anion gap: 8 (ref 5–15)
BILIRUBIN TOTAL: 0.5 mg/dL (ref 0.3–1.2)
BUN: 13 mg/dL (ref 6–20)
CO2: 24 mmol/L (ref 22–32)
Calcium: 8.9 mg/dL (ref 8.9–10.3)
Chloride: 104 mmol/L (ref 101–111)
Creatinine, Ser: 0.93 mg/dL (ref 0.44–1.00)
Glucose, Bld: 107 mg/dL — ABNORMAL HIGH (ref 65–99)
POTASSIUM: 3.3 mmol/L — AB (ref 3.5–5.1)
Sodium: 136 mmol/L (ref 135–145)
TOTAL PROTEIN: 8 g/dL (ref 6.5–8.1)

## 2017-07-11 LAB — TROPONIN I: Troponin I: <0.03 ng/mL (ref <0.03)

## 2017-07-11 LAB — PROTIME-INR
INR: 1.08
PROTHROMBIN TIME: 13.9 s (ref 11.4–15.2)

## 2017-07-11 LAB — MRSA PCR SCREENING: MRSA by PCR: NEGATIVE

## 2017-07-11 SURGERY — LEFT HEART CATH AND CORONARY ANGIOGRAPHY
Anesthesia: LOCAL

## 2017-07-11 MED ORDER — HEPARIN (PORCINE) IN NACL 2-0.9 UNIT/ML-% IJ SOLN
INTRAMUSCULAR | Status: AC
  Filled 2017-07-11: qty 1000

## 2017-07-11 MED ORDER — HEPARIN BOLUS VIA INFUSION
4000.0000 [IU] | Freq: Once | INTRAVENOUS | Status: AC
  Administered 2017-07-11: 4000 [IU] via INTRAVENOUS

## 2017-07-11 MED ORDER — ASPIRIN 81 MG PO CHEW: 324.0000 mg | CHEWABLE_TABLET | ORAL | Status: DC

## 2017-07-11 MED ORDER — ONDANSETRON HCL 4 MG/2ML IJ SOLN
4.0000 mg | Freq: Four times a day (QID) | INTRAMUSCULAR | Status: DC | PRN
  Administered 2017-07-11 – 2017-07-12 (×2): 4 mg via INTRAVENOUS
  Filled 2017-07-11 (×2): qty 2

## 2017-07-11 MED ORDER — HEPARIN SODIUM (PORCINE) 1000 UNIT/ML IJ SOLN
INTRAMUSCULAR | Status: DC | PRN
  Administered 2017-07-11: 5000 [IU] via INTRAVENOUS

## 2017-07-11 MED ORDER — ASPIRIN 81 MG PO CHEW
324.0000 mg | CHEWABLE_TABLET | Freq: Once | ORAL | Status: AC
  Administered 2017-07-11: 324 mg via ORAL
  Filled 2017-07-11: qty 4

## 2017-07-11 MED ORDER — MORPHINE SULFATE (PF) 4 MG/ML IV SOLN
INTRAVENOUS | Status: AC
  Administered 2017-07-11: 4 mg
  Filled 2017-07-11: qty 1

## 2017-07-11 MED ORDER — ATORVASTATIN CALCIUM 80 MG PO TABS
80.0000 mg | ORAL_TABLET | Freq: Every day | ORAL | Status: DC
  Administered 2017-07-11: 80 mg via ORAL
  Filled 2017-07-11: qty 1

## 2017-07-11 MED ORDER — MIDAZOLAM HCL 2 MG/2ML IJ SOLN
INTRAMUSCULAR | Status: AC
  Filled 2017-07-11: qty 2

## 2017-07-11 MED ORDER — SODIUM CHLORIDE 0.9% FLUSH: 3.0000 mL | INTRAVENOUS | Status: DC | PRN

## 2017-07-11 MED ORDER — ALBUTEROL SULFATE (2.5 MG/3ML) 0.083% IN NEBU: 2.5000 mg | INHALATION_SOLUTION | Freq: Four times a day (QID) | RESPIRATORY_TRACT | Status: DC | PRN

## 2017-07-11 MED ORDER — AMLODIPINE BESYLATE 5 MG PO TABS
5.0000 mg | ORAL_TABLET | Freq: Every day | ORAL | Status: DC
  Administered 2017-07-12: 5 mg via ORAL
  Filled 2017-07-11: qty 1

## 2017-07-11 MED ORDER — LIDOCAINE HCL (PF) 1 % IJ SOLN
INTRAMUSCULAR | Status: DC | PRN
  Administered 2017-07-11: 2 mL

## 2017-07-11 MED ORDER — VERAPAMIL HCL 2.5 MG/ML IV SOLN
INTRAVENOUS | Status: AC
  Filled 2017-07-11: qty 2

## 2017-07-11 MED ORDER — ALPRAZOLAM 0.25 MG PO TABS
0.2500 mg | ORAL_TABLET | Freq: Every evening | ORAL | Status: DC | PRN
Start: 2017-07-11 — End: 2017-07-12
  Administered 2017-07-11: 0.25 mg via ORAL
  Filled 2017-07-11: qty 1

## 2017-07-11 MED ORDER — HEPARIN (PORCINE) IN NACL 2-0.9 UNIT/ML-% IJ SOLN
INTRAMUSCULAR | Status: AC | PRN
  Administered 2017-07-11 (×2): 500 mL

## 2017-07-11 MED ORDER — PANTOPRAZOLE SODIUM 40 MG PO TBEC
40.0000 mg | DELAYED_RELEASE_TABLET | Freq: Every day | ORAL | Status: DC
  Administered 2017-07-12: 40 mg via ORAL
  Filled 2017-07-11: qty 1

## 2017-07-11 MED ORDER — ASPIRIN 81 MG PO CHEW: 81.0000 mg | CHEWABLE_TABLET | ORAL | Status: DC

## 2017-07-11 MED ORDER — ACETAMINOPHEN 325 MG PO TABS: 650.0000 mg | ORAL_TABLET | ORAL | Status: DC | PRN

## 2017-07-11 MED ORDER — SODIUM CHLORIDE 0.9% FLUSH: 3.0000 mL | Freq: Two times a day (BID) | INTRAVENOUS | Status: DC

## 2017-07-11 MED ORDER — SODIUM CHLORIDE 0.9 % IV BOLUS
1000.0000 mL | Freq: Once | INTRAVENOUS | Status: AC
  Administered 2017-07-11: 1000 mL via INTRAVENOUS

## 2017-07-11 MED ORDER — HEPARIN SODIUM (PORCINE) 1000 UNIT/ML IJ SOLN
INTRAMUSCULAR | Status: AC
  Filled 2017-07-11: qty 1

## 2017-07-11 MED ORDER — VERAPAMIL HCL 2.5 MG/ML IV SOLN
INTRAVENOUS | Status: DC | PRN
  Administered 2017-07-11: 10 mL via INTRA_ARTERIAL

## 2017-07-11 MED ORDER — HEPARIN SODIUM (PORCINE) 5000 UNIT/ML IJ SOLN
5000.0000 [IU] | Freq: Three times a day (TID) | INTRAMUSCULAR | Status: DC
  Administered 2017-07-11 – 2017-07-12 (×2): 5000 [IU] via SUBCUTANEOUS
  Filled 2017-07-11 (×2): qty 1

## 2017-07-11 MED ORDER — LIDOCAINE HCL (PF) 1 % IJ SOLN
INTRAMUSCULAR | Status: AC
  Filled 2017-07-11: qty 30

## 2017-07-11 MED ORDER — SODIUM CHLORIDE 0.9 % IV SOLN: 250.0000 mL | INTRAVENOUS | Status: DC | PRN

## 2017-07-11 MED ORDER — IOHEXOL 350 MG/ML SOLN
INTRAVENOUS | Status: DC | PRN
  Administered 2017-07-11: 75 mL

## 2017-07-11 MED ORDER — NITROGLYCERIN IN D5W 200-5 MCG/ML-% IV SOLN
0.0000 ug/min | Freq: Once | INTRAVENOUS | Status: AC
  Administered 2017-07-11: 16.667 ug/min via INTRAVENOUS
  Filled 2017-07-11: qty 250

## 2017-07-11 MED ORDER — MIDAZOLAM HCL 2 MG/2ML IJ SOLN
INTRAMUSCULAR | Status: DC | PRN
  Administered 2017-07-11: 1 mg via INTRAVENOUS

## 2017-07-11 MED ORDER — NITROGLYCERIN 0.4 MG SL SUBL: 0.4000 mg | SUBLINGUAL_TABLET | SUBLINGUAL | Status: DC | PRN

## 2017-07-11 MED ORDER — ASPIRIN EC 81 MG PO TBEC
81.0000 mg | DELAYED_RELEASE_TABLET | Freq: Every day | ORAL | Status: DC
  Administered 2017-07-12: 81 mg via ORAL
  Filled 2017-07-11: qty 1

## 2017-07-11 MED ORDER — FENTANYL CITRATE (PF) 100 MCG/2ML IJ SOLN
INTRAMUSCULAR | Status: DC | PRN
  Administered 2017-07-11: 25 ug via INTRAVENOUS

## 2017-07-11 MED ORDER — METOPROLOL TARTRATE 12.5 MG HALF TABLET
12.5000 mg | ORAL_TABLET | Freq: Two times a day (BID) | ORAL | Status: DC
  Administered 2017-07-11 – 2017-07-12 (×2): 12.5 mg via ORAL
  Filled 2017-07-11 (×2): qty 1

## 2017-07-11 MED ORDER — SODIUM CHLORIDE 0.9 % WEIGHT BASED INFUSION: 1.0000 mL/kg/h | INTRAVENOUS | Status: DC

## 2017-07-11 MED ORDER — SODIUM CHLORIDE 0.9 % WEIGHT BASED INFUSION: 3.0000 mL/kg/h | INTRAVENOUS | Status: DC

## 2017-07-11 MED ORDER — FENTANYL CITRATE (PF) 100 MCG/2ML IJ SOLN
INTRAMUSCULAR | Status: AC
  Filled 2017-07-11: qty 2

## 2017-07-11 MED ORDER — ASPIRIN 300 MG RE SUPP: 300.0000 mg | RECTAL | Status: DC

## 2017-07-11 MED ORDER — HEPARIN (PORCINE) IN NACL 100-0.45 UNIT/ML-% IJ SOLN
1100.0000 [IU]/h | INTRAMUSCULAR | Status: DC
  Administered 2017-07-11: 1100 [IU]/h via INTRAVENOUS
  Filled 2017-07-11: qty 250

## 2017-07-11 MED ORDER — SODIUM CHLORIDE 0.9% FLUSH
3.0000 mL | Freq: Two times a day (BID) | INTRAVENOUS | Status: DC
  Administered 2017-07-11 – 2017-07-12 (×2): 3 mL via INTRAVENOUS

## 2017-07-11 MED ORDER — SODIUM CHLORIDE 0.9 % WEIGHT BASED INFUSION
1.0000 mL/kg/h | INTRAVENOUS | Status: AC
  Administered 2017-07-11: 1 mL/kg/h via INTRAVENOUS

## 2017-07-11 SURGICAL SUPPLY — 13 items
BAND CMPR LRG ZPHR (HEMOSTASIS) ×1
BAND ZEPHYR COMPRESS 30 LONG (HEMOSTASIS) ×1 IMPLANT
CATH IMPULSE 5F ANG/FL3.5 (CATHETERS) ×2 IMPLANT
GUIDEWIRE INQWIRE 1.5J.035X260 (WIRE) IMPLANT
INQWIRE 1.5J .035X260CM (WIRE) ×2
KIT HEART LEFT (KITS) ×2 IMPLANT
NDL PERC 21GX4CM (NEEDLE) IMPLANT
NEEDLE PERC 21GX4CM (NEEDLE) ×2 IMPLANT
PACK CARDIAC CATHETERIZATION (CUSTOM PROCEDURE TRAY) ×2 IMPLANT
SHEATH RAIN RADIAL 21G 6FR (SHEATH) ×1 IMPLANT
SYR MEDRAD MARK V 150ML (SYRINGE) ×2 IMPLANT
TRANSDUCER W/STOPCOCK (MISCELLANEOUS) ×2 IMPLANT
TUBING CIL FLEX 10 FLL-RA (TUBING) ×2 IMPLANT

## 2017-07-11 NOTE — ED Provider Notes (Signed)
Brianna Foster   CSN: [id number] Arrival date & time: 07/11/17  0708     History   Chief Complaint Chief Complaint  Patient presents with  . Chest Pain    HPI Brianna Foster is a 62 y.o. female.  HPI   Patient is a 62 year old female presenting with chest pain.  Patient had chest pain that was worse this morning.  She woke up and she had a heaviness in her chest.  Did not radiate to her arms.  Patient remarked that she had shortness of breath.  Patient had worsening symptoms on walking from the car in here.  Diagnosed with a virus in urgent care 2 days ago after having diarrhea and nausea and vomiting.  Has never seen a cardiologist, does have history of hypertension and GERD.     Past Medical History:  Diagnosis Date  . GERD (gastroesophageal reflux disease)   . Headache(784.0)   . Hypertension     Patient Active Problem List   Diagnosis Date Noted  . De Quervain's tenosynovitis, right 05/08/2016  . Cervical radiculopathy 05/08/2016  . Benign paroxysmal positional vertigo 03/20/2016  . Eczema 03/20/2016  . Hyperlipidemia, unspecified 03/20/2016  . Vertigo 10/28/2015  . Headache 10/28/2015  . Tobacco abuse 07/23/2012  . Gastroesophageal reflux disease 06/18/2011  . Essential hypertension 06/18/2011    Past Surgical History:  Procedure Laterality Date  . LEFT HEART CATHETERIZATION WITH CORONARY ANGIOGRAM N/A 05/11/2013   Procedure: LEFT HEART CATHETERIZATION WITH CORONARY ANGIOGRAM;  Surgeon: Burnell Blanks, MD;  Location[facility] CATH LAB;  Service: Cardiovascular;  Laterality: N/A;  . neck fusion       OB History   None      Home Medications    Prior to Admission medications   Medication Sig Start Date End Date Taking? Authorizing Provider  albuterol (PROVENTIL HFA;VENTOLIN HFA) 108 (90 Base) MCG/ACT inhaler Inhale 2 puffs into the lungs every 6 (six) hours as needed for wheezing or shortness of breath.  05/08/17   Martinique, Betty G, MD  amLODipine (NORVASC) 5 MG tablet TAKE 1 TABLET (5 MG TOTAL) BY MOUTH DAILY. 01/23/17   Martinique, Betty G, MD  atorvastatin (LIPITOR) 20 MG tablet Take 1 tablet (20 mg total) by mouth daily. 05/15/17   Martinique, Betty G, MD  famotidine (PEPCID) 20 MG tablet Take 1 tablet (20 mg total) by mouth 2 (two) times daily. 05/02/16   Fredia Sorrow, MD  fluticasone (FLONASE) 50 MCG/ACT nasal spray Place 1 spray into both nostrils 2 (two) times daily. 05/08/17   Martinique, Betty G, MD  meclizine (ANTIVERT) 25 MG tablet Take 1 tablet (25 mg total) by mouth 3 (three) times daily as needed for dizziness. 05/08/17   Martinique, Betty G, MD  pantoprazole (PROTONIX) 40 MG tablet Take 1 tablet (40 mg total) by mouth daily. 12/11/16   Martinique, Betty G, MD  pantoprazole (PROTONIX) 40 MG tablet TAKE 1 TABLET (40 MG TOTAL) BY MOUTH DAILY. 06/21/17   Martinique, Betty G, MD  triamcinolone cream (KENALOG) 0.1 % Apply 1 application topically 2 (two) times daily as needed. 03/20/16   Martinique, Betty G, MD  triamterene-hydrochlorothiazide (MAXZIDE-25) 37.5-25 MG tablet Take 1 tablet by mouth daily. 01/23/17   Martinique, Betty G, MD    Family History Family History  Problem Relation Age of Onset  . Lung cancer Mother   . Stomach cancer Father   . Heart disease Brother        Pacemaker  .  Esophageal cancer Brother   . Breast cancer Sister     Social History Social History   Tobacco Use  . Smoking status: Current Every Day Smoker    Packs/day: 0.25    Years: 16.00    Pack years: 4.00    Types: Cigarettes  . Smokeless tobacco: Never Used  Substance Use Topics  . Alcohol use: No    Comment: Occassional Use  . Drug use: No     Allergies   Patient has no known allergies.   Review of Systems Review of Systems  Constitutional: Positive for fatigue. Negative for activity change.  Respiratory: Positive for chest tightness and shortness of breath.   Cardiovascular: Positive for chest pain.    Gastrointestinal: Positive for diarrhea. Negative for abdominal pain.  All other systems reviewed and are negative.    Physical Exam Updated Vital Signs BP (!) 144/79 (BP Location: Right Arm)   Pulse 76   Temp 98.5 F (36.9 C) (Oral)   Resp 18   LMP 01/07/2010 (LMP Unknown)   SpO2 99%   Physical Exam  Constitutional: She is oriented to person, place, and time. She appears well-developed and well-nourished.  HENT:  Head: Normocephalic and atraumatic.  Eyes: Right eye exhibits no discharge.  Cardiovascular: Normal rate, regular rhythm and normal heart sounds.  No murmur heard. Pulmonary/Chest: Effort normal and breath sounds normal. She has no wheezes. She has no rales.  Abdominal: Soft. She exhibits no distension. There is no tenderness.  Neurological: She is oriented to person, place, and time.  Skin: Skin is warm and dry. She is not diaphoretic.  Psychiatric: She has a normal mood and affect.  Nursing Foster and vitals reviewed.    ED Treatments / Results  Labs (all labs ordered are listed, but only abnormal results are displayed) Labs Reviewed  COMPREHENSIVE METABOLIC PANEL  CBC WITH DIFFERENTIAL/PLATELET  TROPONIN I    EKG None  Radiology No results found.  Procedures Procedures (including critical care time)  CRITICAL CARE Performed by: Gardiner Sleeper Total critical care time: 60 minutes Critical care time was exclusive of separately billable procedures and treating other patients. Critical care was necessary to treat or prevent imminent or life-threatening deterioration. Critical care was time spent personally by me on the following activities: development of treatment plan with patient and/or surrogate as well as nursing, discussions with consultants, evaluation of patient's response to treatment, examination of patient, obtaining history from patient or surrogate, ordering and performing treatments and interventions, ordering and review of laboratory  studies, ordering and review of radiographic studies, pulse oximetry and re-evaluation of patient's condition.   Medications Ordered in ED Medications  sodium chloride 0.9 % bolus 1,000 mL (has no administration in time range)  aspirin chewable tablet 324 mg (has no administration in time range)     Initial Impression / Assessment and Plan / ED Course  I have reviewed the triage vital signs and the nursing notes.  Pertinent labs & imaging results that were available during my care of the patient were reviewed by me and considered in my medical decision making (see chart for details).     Patient is a 62 year old female presenting with chest pain.  Patient had chest pain that was worse this morning.  She woke up and she had a heaviness in her chest.  Did not radiate to her arms.  Patient remarked that she had shortness of breath.  Patient had worsening symptoms on walking from the car in here.  Diagnosed with a virus in urgent care 2 days ago after having diarrhea and nausea and vomiting.  Has never seen a cardiologist, does have history of hypertension and GERD.   7:34 AM I was concerned about acute ongoing ischemia given EKG, discussed with Dr. Martinique on for cardiology.  He reports no evidence of STEMI requiring intervention, recommends heparin discussion with regular cardiology.  Cardiology paged.  Heparin started aspirin given.   With Dr. Duwayne Heck.  He will put in admissions orders.   10:28 AM Initial troponin is negative.  However patient began having more chest pain around 10 AM.  EKG shot.  Shows a left bundle now.   Patient having pain underneath the left breast.  Call Dr. Debara Pickett.  He will change the bed to ICU bed so the patient is able to get there faster.  Also discussed with Dr. Ellender Hose in the emergency department to do an ED to ED transfer if not able to get a bed upstairs.  I think patient is better served in a place where she can immediately get a cath in case this  continues to progress.  Final Clinical Impressions(s) / ED Diagnoses   Final diagnoses:  None    ED Discharge Orders    None       Macarthur Critchley, MD 07/12/17 [phone]

## 2017-07-11 NOTE — H&P (Addendum)
Cardiology Admission History and Physical:   Patient ID: Brianna Foster; MRN: [id number]; DOB: [birth year]   Admission date: 07/11/2017  Primary Care Provider: Martinique, Betty G, MD Primary Cardiologist: Kirk Ruths, MD  Primary Electrophysiologist:    Chief Complaint:  Chest pain  Patient Profile:   Brianna Foster is a 62 y.o. female with a history of heart cath in 2015 negative for obstructive disease,   History of Present Illness:   Brianna Foster had a left heart cath in 2015 with nonobstructive disease. She has done well since then.  She reports diagnosis of stomach bug by urgent care 2 days ago. She never got better. Last evening at approximately 10pm, she developed substernal chest pain that turned into a pressure. She tried to sleep, but had a very restless night. She awoke this morning with worsening pain rated as a 8/10 that turned into pressure prompting her to report to [facility]. EKG at [facility] was negative for STEMI, discussed with Dr. Martinique. EKG notable for TWI in precordial leads. A recurrence of chest pain that radiated under her left breast to her back prompted a repeat EKG with new LBBB. She was emergently transferred to [facility] to ICU bed and will be taken to heart catheterization. Morphine and nitro drip have eased her pain. She was also started on heparin.  She denies bleeding problems. She reports ongoing nausea and SOB with the chest pain. She also reports a new onset orthopnea and tender lower extremities.   Past Medical History:  Diagnosis Date  . GERD (gastroesophageal reflux disease)   . Headache(784.0)   . Hypertension     Past Surgical History:  Procedure Laterality Date  . LEFT HEART CATHETERIZATION WITH CORONARY ANGIOGRAM N/A 05/11/2013   Procedure: LEFT HEART CATHETERIZATION WITH CORONARY ANGIOGRAM;  Surgeon: Burnell Blanks, MD;  Location: [facility] CATH LAB;  Service: Cardiovascular;  Laterality: N/A;  . neck fusion        Medications Prior to Admission: Prior to Admission medications   Medication Sig Start Date End Date Taking? Authorizing Provider  albuterol (PROVENTIL HFA;VENTOLIN HFA) 108 (90 Base) MCG/ACT inhaler Inhale 2 puffs into the lungs every 6 (six) hours as needed for wheezing or shortness of breath. 05/08/17   Martinique, Betty G, MD  amLODipine (NORVASC) 5 MG tablet TAKE 1 TABLET (5 MG TOTAL) BY MOUTH DAILY. 01/23/17   Martinique, Betty G, MD  atorvastatin (LIPITOR) 20 MG tablet Take 1 tablet (20 mg total) by mouth daily. 05/15/17   Martinique, Betty G, MD  famotidine (PEPCID) 20 MG tablet Take 1 tablet (20 mg total) by mouth 2 (two) times daily. 05/02/16   Fredia Sorrow, MD  fluticasone (FLONASE) 50 MCG/ACT nasal spray Place 1 spray into both nostrils 2 (two) times daily. 05/08/17   Martinique, Betty G, MD  meclizine (ANTIVERT) 25 MG tablet Take 1 tablet (25 mg total) by mouth 3 (three) times daily as needed for dizziness. 05/08/17   Martinique, Betty G, MD  pantoprazole (PROTONIX) 40 MG tablet Take 1 tablet (40 mg total) by mouth daily. 12/11/16   Martinique, Betty G, MD  pantoprazole (PROTONIX) 40 MG tablet TAKE 1 TABLET (40 MG TOTAL) BY MOUTH DAILY. 06/21/17   Martinique, Betty G, MD  triamcinolone cream (KENALOG) 0.1 % Apply 1 application topically 2 (two) times daily as needed. 03/20/16   Martinique, Betty G, MD  triamterene-hydrochlorothiazide (MAXZIDE-25) 37.5-25 MG tablet Take 1 tablet by mouth daily. 01/23/17   Martinique, Betty G, MD  Allergies:   No Known Allergies  Social History:   Social History   Socioeconomic History  . Marital status: Divorced    Spouse name: Not on file  . Number of children: 1  . Years of education: College  . Highest education level: Not on file  Occupational History    Comment: [employer]  Social Needs  . Financial resource strain: Not on file  . Food insecurity:    Worry: Not on file    Inability: Not on file  . Transportation needs:    Medical: Not on file    Non-medical:  Not on file  Tobacco Use  . Smoking status: Current Every Day Smoker    Packs/day: 0.25    Years: 16.00    Pack years: 4.00    Types: Cigarettes  . Smokeless tobacco: Never Used  Substance and Sexual Activity  . Alcohol use: No    Comment: Occassional Use  . Drug use: No  . Sexual activity: Not Currently    Birth control/protection: Post-menopausal  Lifestyle  . Physical activity:    Days per week: Not on file    Minutes per session: Not on file  . Stress: Not on file  Relationships  . Social connections:    Talks on phone: Not on file    Gets together: Not on file    Attends religious service: Not on file    Active member of club or organization: Not on file    Attends meetings of clubs or organizations: Not on file    Relationship status: Not on file  . Intimate partner violence:    Fear of current or ex partner: Not on file    Emotionally abused: Not on file    Physically abused: Not on file    Forced sexual activity: Not on file  Other Topics Concern  . Not on file  Social History Narrative   Lives at home alone   Right-handed   Drinks decaf coffee    Family History:   The patient's family history includes Breast cancer in her sister; Esophageal cancer in her brother; Heart disease in her brother; Lung cancer in her mother; Stomach cancer in her father.    ROS:  Please see the history of present illness.  All other ROS reviewed and negative.     Physical Exam/Data:   Vitals:   07/11/17 0930 07/11/17 0945 07/11/17 1020 07/11/17 1045  BP: 129/80 121/81 (!) 142/80 (!) 155/94  Pulse: 66 80 72 73  Resp: 14 16 (!) 21 (!) 21  Temp:      TempSrc:      SpO2: 96% 91% 97% 98%    Intake/Output Summary (Last 24 hours) at 07/11/2017 1229 Last data filed at 07/11/2017 0847 Gross per 24 hour  Intake 1000 ml  Output -  Net 1000 ml   There were no vitals filed for this visit. There is no height or weight on file to calculate BMI.  General:  Well nourished, well  developed, in no acute distress HEENT: normal Neck: no JVD Vascular: No carotid bruits Cardiac:  normal S1, S2; RRR; no murmur Lungs:  clear to auscultation bilaterally, no wheezing, rhonchi or rales  Abd: soft, nontender, no hepatomegaly  Ext: trace edema Musculoskeletal:  No deformities, BUE and BLE strength normal and equal Skin: warm and dry  Neuro:  CNs 2-12 intact, no focal abnormalities noted Psych:  Normal affect    EKG:  The ECG that was done and was  personally reviewed and demonstrates TWI in inferior and anteroseptal leads with development of LBBB  Relevant CV Studies:  Heart cath 05/11/13: No significant CAD  Due to late hour will keep over night     Laboratory Data:  Chemistry Recent Labs  Lab 07/11/17 0729  NA 136  K 3.3*  CL 104  CO2 24  GLUCOSE 107*  BUN 13  CREATININE 0.93  CALCIUM 8.9  GFRNONAA >60  GFRAA >60  ANIONGAP 8    Recent Labs  Lab 07/11/17 0729  PROT 8.0  ALBUMIN 4.0  AST 27  ALT 25  ALKPHOS 73  BILITOT 0.5   Hematology Recent Labs  Lab 07/11/17 0729  WBC 6.7  RBC 4.62  HGB 14.1  HCT 41.2  MCV 89.2  MCH 30.5  MCHC 34.2  RDW 12.5  PLT 259   Cardiac Enzymes Recent Labs  Lab 07/11/17 0729  TROPONINI <0.03   No results for input(s): TROPIPOC in the last 168 hours.  BNPNo results for input(s): BNP, PROBNP in the last 168 hours.  DDimer No results for input(s): DDIMER in the last 168 hours.  Radiology/Studies:  Dg Chest Portable 1 View  Result Date: 07/11/2017 CLINICAL DATA:  62 year old female with vomiting since 07/09/2017 and substernal chest pain. EXAM: PORTABLE CHEST 1 VIEW COMPARISON:  05/02/2016 and earlier. FINDINGS: Portable AP view at 0729 hours. Normal lung volumes. Allowing for portable technique the lungs are clear. No pneumothorax or pneumoperitoneum. Mediastinal contours are stable and within normal limits. Visualized tracheal air column is within normal limits. Partially visible cervical ACDF. Negative  visible bowel gas pattern. No acute osseous abnormality identified. IMPRESSION: Negative.  No acute cardiopulmonary abnormality. Electronically Signed   By: Genevie Ann M.D.   On: 07/11/2017 08:06    Assessment and Plan:   1. Chest pain EKG and symptoms are concerning for unstable angina. Troponins have been negative. However, given her EKG changes suspicious ACS, she will be taken to heart catheterization. Nitro and morphine have decreased her chest pain. She was also started on heparin.  2. New onset orthopnea Will obtain echocardiogram following heart cath  3. HTN Home: norvasc, maxzide  4. HLD On lipitor 20 mg Will repeat lipid panel here.    Severity of Illness: The appropriate patient status for this patient is INPATIENT. Inpatient status is judged to be reasonable and necessary in order to provide the required intensity of service to ensure the patient's safety. The patient's presenting symptoms, physical exam findings, and initial radiographic and laboratory data in the context of their chronic comorbidities is felt to place them at high risk for further clinical deterioration. Furthermore, it is not anticipated that the patient will be medically stable for discharge from the hospital within 2 midnights of admission. The following factors support the patient status of inpatient.   " The patient's presenting symptoms include chest pain . " The worrisome physical exam findings include unstable angina . " The initial radiographic and laboratory data are worrisome because of abnormal EKG " The chronic co-morbidities include HTN, HLD   * I certify that at the point of admission it is my clinical judgment that the patient will require inpatient hospital care spanning beyond 2 midnights from the point of admission due to high intensity of service, high risk for further deterioration and high frequency of surveillance required.*    For questions or updates, please contact [facility] Please consult [email or web address] for contact info under Cardiology/STEMI.    Signed, Tami Lin  [facility], PA  07/11/2017 12:29 PM  As above, patient seen and examined.  Briefly she is a 62 year old female with past medical history of hypertension, gastroesophageal reflux disease with unstable angina.  Patient states she developed abdominal pain and diarrhea 48 hours ago.  Last evening she developed chest pressure radiating under her left breast.  The pain increases with inspiration.  It was intermittent throughout the night and would not resolve this morning.  She went to [facility] med center and EKG abnormal.  She was transferred for further evaluation.  She has ongoing chest pain.  No recent travel.  She notes some dyspnea on exertion.  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  Laboratories show potassium 3.3, creatinine 0.93, hemoglobin 14.1.  Electrocardiogram shows sinus rhythm with anterior and inferior T wave inversion.  Follow-up ECG shows sinus rhythm, intermittent left bundle branch block with septal T wave changes.  1 unstable angina-patient's symptoms are atypical but electrocardiogram is extremely concerning.  Feel definitive evaluation is warranted given ongoing chest pain with markedly abnormal electrocardiogram.  Plan cardiac catheterization.  The risks and benefits including myocardial infarction, CVA and death discussed and she agrees to proceed.  Treat with aspirin, heparin and nitroglycerin. Check echocardiogram.  2 Hypokalemia-supplement  3 Hypertension- follow BP and adjust regimen as needed.  4 tobacco abuse-patient counseled on discontinuing.  Kirk Ruths, MD

## 2017-07-11 NOTE — ED Triage Notes (Signed)
Pt states went to UC 2days ago for vomiting, dx with virus. States this am has diarrhea and center chest pain, denies pain radiating

## 2017-07-11 NOTE — Interval H&P Note (Signed)
History and Physical Interval Note:  07/11/2017 1:41 PM  Brianna Foster  has presented today for surgery, with the diagnosis of cp  The various methods of treatment have been discussed with the patient and family. After consideration of risks, benefits and other options for treatment, the patient has consented to  Procedure(s): LEFT HEART CATH AND CORONARY ANGIOGRAPHY (N/A) as a surgical intervention .  The patient's history has been reviewed, patient examined, no change in status, stable for surgery.  I have reviewed the patient's chart and labs.  Questions were answered to the patient's satisfaction.   Cath Lab Visit (complete for each Cath Lab visit)  Clinical Evaluation Leading to the Procedure:   ACS: Yes.    Non-ACS:    Anginal Classification: CCS IV  Anti-ischemic medical therapy: No Therapy  Non-Invasive Test Results: No non-invasive testing performed  Prior CABG: No previous CABG        Col[facility] 07/11/2017 1:41 PM

## 2017-07-11 NOTE — Progress Notes (Signed)
ANTICOAGULATION CONSULT NOTE - Initial Consult  Pharmacy Consult for Heparin Indication: chest pain/ACS  No Known Allergies  Patient Measurements:  Actual body weight: 99.8 kg (from 05/24/17) Heparin Dosing Weight: 88 kg   Vital Signs: Temp: 98.5 F (36.9 C) (04/04 0717) Temp Source: Oral (04/04 0717) BP: 144/79 (04/04 0717) Pulse Rate: 76 (04/04 0717)  Labs: Recent Labs    07/11/17 0729  HGB 14.1  HCT 41.2  PLT 259    CrCl cannot be calculated (Patient's most recent lab result is older than the maximum 21 days allowed.).   Medical History: Past Medical History:  Diagnosis Date  . GERD (gastroesophageal reflux disease)   . Headache(784.0)   . Hypertension     Medications:   (Not in a hospital admission)  Assessment: 31 YOF who presented with chest pain. Pharmacy consulted for IV heparin for ACS. H/H and Plt wnl. Pt is not on any anticoagulation prior to admission   Goal of Therapy:  Heparin level 0.3-0.7 units/ml Monitor platelets by anticoagulation protocol: Yes   Plan:  -Heparin 4000 units IV once, then start heparin infusion at 1100 units/hr  -F/u 6 hr HL -Monitor daily HL, CBC and s/s of bleeding  Albertina Parr, PharmD., BCPS Clinical Pharmacist Clinical phone for 07/11/17 until 3:30pm: [phone]

## 2017-07-12 ENCOUNTER — Encounter (HOSPITAL_COMMUNITY): Payer: Self-pay | Admitting: Cardiology

## 2017-07-12 DIAGNOSIS — I10 Essential (primary) hypertension: Secondary | ICD-10-CM | POA: Diagnosis not present

## 2017-07-12 DIAGNOSIS — I200 Unstable angina: Secondary | ICD-10-CM | POA: Diagnosis not present

## 2017-07-12 DIAGNOSIS — E876 Hypokalemia: Secondary | ICD-10-CM | POA: Diagnosis not present

## 2017-07-12 DIAGNOSIS — E785 Hyperlipidemia, unspecified: Secondary | ICD-10-CM | POA: Diagnosis not present

## 2017-07-12 LAB — LIPID PANEL
CHOL/HDL RATIO: 3.4 ratio
CHOLESTEROL: 142 mg/dL (ref 0–200)
HDL: 42 mg/dL (ref >40)
LDL Cholesterol: 77 mg/dL (ref 0–99)
Triglycerides: 113 mg/dL (ref <150)
VLDL: 23 mg/dL (ref 0–40)

## 2017-07-12 LAB — BASIC METABOLIC PANEL
Anion gap: 9 (ref 5–15)
BUN: 8 mg/dL (ref 6–20)
CALCIUM: 8.6 mg/dL — AB (ref 8.9–10.3)
CO2: 25 mmol/L (ref 22–32)
Chloride: 103 mmol/L (ref 101–111)
Creatinine, Ser: 0.73 mg/dL (ref 0.44–1.00)
GFR calc Af Amer: >60 mL/min (ref >60)
GLUCOSE: 94 mg/dL (ref 65–99)
Potassium: 3.2 mmol/L — ABNORMAL LOW (ref 3.5–5.1)
SODIUM: 137 mmol/L (ref 135–145)

## 2017-07-12 LAB — TROPONIN I

## 2017-07-12 LAB — PROTIME-INR
INR: 1.05
PROTHROMBIN TIME: 13.6 s (ref 11.4–15.2)

## 2017-07-12 MED ORDER — METOPROLOL TARTRATE 25 MG PO TABS: 12.5000 mg | ORAL_TABLET | Freq: Two times a day (BID) | ORAL | 3 refills | Status: DC

## 2017-07-12 MED ORDER — POTASSIUM CHLORIDE CRYS ER 20 MEQ PO TBCR
40.0000 meq | EXTENDED_RELEASE_TABLET | Freq: Once | ORAL | Status: AC
Start: 2017-07-12 — End: 2017-07-12
  Administered 2017-07-12: 40 meq via ORAL
  Filled 2017-07-12: qty 2

## 2017-07-12 MED FILL — Heparin Sodium (Porcine) 2 Unit/ML in Sodium Chloride 0.9%: INTRAMUSCULAR | Qty: 1000 | Status: AC

## 2017-07-12 NOTE — Discharge Summary (Signed)
Discharge Summary    Patient ID: Brianna Foster,  MRN: [id number], DOB/AGE: 1956/03/31 62 y.o.  Admit date: 07/11/2017 Discharge date: 07/12/2017  Primary Care Provider: Martin[place], Betty Foster Primary Cardiologist: Brianna Ruths, MD  Discharge Diagnoses    Active Problems:   Unstable angina [facility])   Allergies No Known Allergies  Diagnostic Studies/Procedures    Left Heart Catheterization 07/11/17:  The left ventricular systolic function is normal.  LV end diastolic pressure is normal.  The left ventricular ejection fraction is 55-65% by visual estimate.   1. Normal coronary anatomy 2. Normal LV function 3. Normal LVEDP  Plan: medical management.  _____________   History of Present Illness     Brianna Foster had a left heart cath in 2015 with nonobstructive disease. She has done well since then.  She reports diagnosis of stomach bug by urgent care 2 days ago. She never got better. Last evening at approximately 10pm, she developed substernal chest pain that turned into a pressure. She tried to sleep, but had a very restless night. She awoke this morning with worsening pain rated as a 8/10 that turned into pressure prompting her to report to [employer]. EKG at [facility] was negative for STEMI, discussed with Brianna Foster. EKG notable for TWI in precordial leads. A recurrence of chest pain that radiated under her left breast to her back prompted a repeat EKG with new LBBB. She was emergently transferred to [facility] to ICU bed and will be taken to heart catheterization. Morphine and nitro drip have eased her pain. She was also started on heparin.  She denies bleeding problems. She reports ongoing nausea and SOB with the chest pain. She also reports a new onset orthopnea and tender lower extremities.     Hospital Course     Consultants: None   1. Unstable angina: EKG with new LBBB. Troponins negative. LHC with normal coronary anatomy; EF 55-65%. Patient was  maintained on heparin and nitro gtt prior to cath.   2. HTN: Maxzide discontinued. BP remained stable on amlodipine which can be uptitrated outpatient if needed.  3. HLD: Continued on lipitor at discharge  4. Hypokalemia: discharged on po supplement   5. Tobacco abuse: continue to encourage smoking cessation _____________  Discharge Vitals Blood pressure 132/70, pulse (!) 57, temperature 98 F (36.7 C), temperature source Oral, resp. rate 18, height 5\' 9"  (1.753 m), weight 213 lb 8 oz (96.8 kg), last menstrual period 01/07/2010, SpO2 98 %.  Filed Weights   07/11/17 1300 07/11/17 1557 07/12/17 0624  Weight: 216 lb 7.9 oz (98.2 kg) 212 lb 8 oz (96.4 kg) 213 lb 8 oz (96.8 kg)    Labs & Radiologic Studies    CBC Recent Labs    07/11/17 0729  WBC 6.7  NEUTROABS 4.1  HGB 14.1  HCT 41.2  MCV 89.2  PLT 250   Basic Metabolic Panel Recent Labs    07/11/17 0729 07/12/17 0407  NA 136 137  K 3.3* 3.2*  CL 104 103  CO2 24 25  GLUCOSE 107* 94  BUN 13 8  CREATININE 0.93 0.73  CALCIUM 8.9 8.6*   Liver Function Tests Recent Labs    07/11/17 0729  AST 27  ALT 25  ALKPHOS 73  BILITOT 0.5  PROT 8.0  ALBUMIN 4.0   No results for input(s): LIPASE, AMYLASE in the last 72 hours. Cardiac Enzymes Recent Labs    07/11/17 1553 07/11/17 2141 07/12/17 0407  TROPONINI <0.03 <  0.03 <0.03   BNP Invalid input(s): POCBNP D-Dimer No results for input(s): DDIMER in the last 72 hours. Hemoglobin A1C No results for input(s): HGBA1C in the last 72 hours. Fasting Lipid Panel Recent Labs    07/12/17 0407  CHOL 142  HDL 42  LDLCALC 77  TRIG 113  CHOLHDL 3.4   Thyroid Function Tests No results for input(s): TSH, T4TOTAL, T3FREE, THYROIDAB in the last 72 hours.  Invalid input(s): FREET3 _____________  Dg Chest Portable 1 View  Result Date: 07/11/2017 CLINICAL DATA:  62 year old female with vomiting since 07/09/2017 and substernal chest pain. EXAM: PORTABLE CHEST 1 VIEW  COMPARISON:  05/02/2016 and earlier. FINDINGS: Portable AP view at 0729 hours. Normal lung volumes. Allowing for portable technique the lungs are clear. No pneumothorax or pneumoperitoneum. Mediastinal contours are stable and within normal limits. Visualized tracheal air column is within normal limits. Partially visible cervical ACDF. Negative visible bowel gas pattern. No acute osseous abnormality identified. IMPRESSION: Negative.  No acute cardiopulmonary abnormality. Electronically Signed   By: Genevie Ann M.D.   On: 07/11/2017 08:06   Mm Digital Screening Bilateral  Result Date: 06/18/2017 CLINICAL DATA:  Screening. EXAM: DIGITAL SCREENING BILATERAL MAMMOGRAM WITH CAD COMPARISON:  Previous exam(s). ACR Breast Density Category a: The breast tissue is almost entirely fatty. FINDINGS: There are no findings suspicious for malignancy. Images were processed with CAD. IMPRESSION: No mammographic evidence of malignancy. A result letter of this screening mammogram will be mailed directly to the patient. RECOMMENDATION: Screening mammogram in one year. (Code:SM-B-01Y) BI-RADS CATEGORY  1: Negative. Electronically Signed   By: Ammie Ferrier M.D.   On: 06/18/2017 11:04   Disposition   Patient was seen and examined by Brianna Foster who deemed patient as stable for discharge. Follow-up has been arranged. Discharge medications as listed below.   Follow-up Plans & Appointments    Follow-up Information    Foster, Betty G, MD Follow up.   Specialty:  Family Medicine Why:  Please call to schedule an appointment to be seen within 2 weeks of discharge for post-hospital follow-up.  Contact information: [place] [id number] [phone]          Discharge Instructions    Diet - low sodium heart healthy   Complete by:  As directed    Increase activity slowly   Complete by:  As directed       Discharge Medications   Allergies as of 07/12/2017   No Known Allergies       Medication List    STOP taking these medications   ondansetron 4 MG disintegrating tablet Commonly known as:  ZOFRAN-ODT   triamterene-hydrochlorothiazide 37.5-25 MG tablet Commonly known as:  MAXZIDE-25     TAKE these medications   albuterol 108 (90 Base) MCG/ACT inhaler Commonly known as:  PROVENTIL HFA;VENTOLIN HFA Inhale 2 puffs into the lungs every 6 (six) hours as needed for wheezing or shortness of breath.   amLODipine 5 MG tablet Commonly known as:  NORVASC TAKE 1 TABLET (5 MG TOTAL) BY MOUTH DAILY.   atorvastatin 20 MG tablet Commonly known as:  LIPITOR Take 1 tablet (20 mg total) by mouth daily.   diphenhydrAMINE 25 MG tablet Commonly known as:  SOMINEX Take 25 mg by mouth at bedtime as needed for sleep.   famotidine 20 MG tablet Commonly known as:  PEPCID Take 1 tablet (20 mg total) by mouth 2 (two) times daily.   fluticasone 50 MCG/ACT nasal spray Commonly known as:  FLONASE Place 1 spray into both nostrils 2 (two) times daily.   meclizine 25 MG tablet Commonly known as:  ANTIVERT Take 1 tablet (25 mg total) by mouth 3 (three) times daily as needed for dizziness.   metoprolol tartrate 25 MG tablet Commonly known as:  LOPRESSOR Take 0.5 tablets (12.5 mg total) by mouth 2 (two) times daily.   pantoprazole 40 MG tablet Commonly known as:  PROTONIX Take 1 tablet (40 mg total) by mouth daily. What changed:  Another medication with the same name was removed. Continue taking this medication, and follow the directions you see here.   triamcinolone cream 0.1 % Commonly known as:  KENALOG Apply 1 application topically 2 (two) times daily as needed.       Outstanding Labs/Studies   None  Duration of Discharge Encounter   Greater than 30 minutes including physician time.  Signed, Abigail Butts PA-C 07/12/2017, 2:53 PM

## 2017-07-12 NOTE — Progress Notes (Addendum)
Progress Note  Patient Name: Brianna Foster Date of Encounter: 07/12/2017  Primary Cardiologist: Kirk Ruths, MD   Subjective   No chest pain or dyspnea  Inpatient Medications    Scheduled Meds: . amLODipine  5 mg Oral Daily  . aspirin EC  81 mg Oral Daily  . atorvastatin  80 mg Oral Q2000  . heparin  5,000 Units Subcutaneous Q8H  . metoprolol tartrate  12.5 mg Oral BID  . pantoprazole  40 mg Oral Daily  . sodium chloride flush  3 mL Intravenous Q12H   Continuous Infusions: . sodium chloride     PRN Meds: sodium chloride, acetaminophen, albuterol, ALPRAZolam, nitroGLYCERIN, ondansetron (ZOFRAN) IV, sodium chloride flush   Vital Signs    Vitals:   07/11/17 1520 07/11/17 1557 07/11/17 2101 07/12/17 0624  BP: 140/74 128/68 124/69 132/70  Pulse: 60  66 (!) 57  Resp: 15 15 16 18   Temp:  98.1 F (36.7 C) 98.5 F (36.9 C) 98 F (36.7 C)  TempSrc:   Oral Oral  SpO2: 92% 97% 95% 98%  Weight:  212 lb 8 oz (96.4 kg)  213 lb 8 oz (96.8 kg)  Height:  5\' 9"  (1.753 m)      Intake/Output Summary (Last 24 hours) at 07/12/2017 0913 Last data filed at 07/12/2017 0600 Gross per 24 hour  Intake 600 ml  Output 0 ml  Net 600 ml   Filed Weights   07/11/17 1300 07/11/17 1557 07/12/17 0624  Weight: 216 lb 7.9 oz (98.2 kg) 212 lb 8 oz (96.4 kg) 213 lb 8 oz (96.8 kg)    Telemetry    Sinus- Personally Reviewed   Physical Exam   GEN: No acute distress.   Neck: No JVD Cardiac: RRR, no murmurs, rubs, or gallops.  Respiratory: Clear to auscultation bilaterally. GI: Soft, nontender, non-distended  MS: No edema; radial cath site with no hematoma Neuro:  Nonfocal  Psych: Normal affect   Labs    Chemistry Recent Labs  Lab 07/11/17 0729 07/12/17 0407  NA 136 137  K 3.3* 3.2*  CL 104 103  CO2 24 25  GLUCOSE 107* 94  BUN 13 8  CREATININE 0.93 0.73  CALCIUM 8.9 8.6*  PROT 8.0  --   ALBUMIN 4.0  --   AST 27  --   ALT 25  --   ALKPHOS 73  --   BILITOT 0.5  --     GFRNONAA >60 >60  GFRAA >60 >60  ANIONGAP 8 9     Hematology Recent Labs  Lab 07/11/17 0729  WBC 6.7  RBC 4.62  HGB 14.1  HCT 41.2  MCV 89.2  MCH 30.5  MCHC 34.2  RDW 12.5  PLT 259    Cardiac Enzymes Recent Labs  Lab 07/11/17 0729 07/11/17 1553 07/11/17 2141 07/12/17 0407  TROPONINI <0.03 <0.03 <0.03 <0.03    Radiology    Dg Chest Portable 1 View  Result Date: 07/11/2017 CLINICAL DATA:  62 year old female with vomiting since 07/09/2017 and substernal chest pain. EXAM: PORTABLE CHEST 1 VIEW COMPARISON:  05/02/2016 and earlier. FINDINGS: Portable AP view at 0729 hours. Normal lung volumes. Allowing for portable technique the lungs are clear. No pneumothorax or pneumoperitoneum. Mediastinal contours are stable and within normal limits. Visualized tracheal air column is within normal limits. Partially visible cervical ACDF. Negative visible bowel gas pattern. No acute osseous abnormality identified. IMPRESSION: Negative.  No acute cardiopulmonary abnormality. Electronically Signed   By: Herminio Heads.D.  On: 07/11/2017 08:06    Patient Profile     62 y.o. female admitted with chest pain and abnormal ECG.   Assessment & Plan    1 unstable angina-enzymes negative; cath reveals normal LV function and no CAD; plan DC today.   2 Hypokalemia-supplement  3 Hypertension- DC maxzide; follow BP and adjust regimen as needed (amlodipine could be increased if needed).  4 tobacco abuse-patient counseled on discontinuing.  For questions or updates, please contact [place] Please consult [email or web address] for contact info under Cardiology/STEMI.  DC today and fu with primary care > 30 min PA and physician time D2    Signed, Kirk Ruths, MD  07/12/2017, 9:13 AM

## 2017-07-15 ENCOUNTER — Telehealth: Payer: Self-pay | Admitting: Family Medicine

## 2017-07-15 NOTE — Telephone Encounter (Signed)
Transition Care Management Follow-up Telephone Call    Patient ID: Brianna Foster,  MRN: [id number], DOB/AGE: Oct 12, 1955 63 y.o.  Admit date: 07/11/2017 Discharge date: 07/12/2017  Primary Care Provider: Martin[place], Betty G Primary Cardiologist: Kirk Ruths, MD  Discharge Diagnoses    Active Problems:   Unstable angina [facility])   Allergies No Known Allergies  Diagnostic Studies/Procedures    Left Heart Catheterization 07/11/17:  The left ventricular systolic function is normal.  LV end diastolic pressure is normal.  The left ventricular ejection fraction is 55-65% by visual estimate.  1. Normal coronary anatomy 2. Normal LV function 3. Normal LVEDP  Plan: medical management.    How have you been since you were released from the hospital? "good"   Do you understand why you were in the hospital? yes   Do you understand the discharge instructions? yes   Where were you discharged to? Home   Items Reviewed:  Medications reviewed: yes  Allergies reviewed: yes  Dietary changes reviewed: yes  Referrals reviewed: yes   Functional Questionnaire:   Activities of Daily Living (ADLs):   She states they are independent in the following: ambulation, bathing and hygiene, feeding, continence, grooming, toileting and dressing States they require assistance with the following: none   Any transportation issues/concerns?: no   Any patient concerns? no   Confirmed importance and date/time of follow-up visits scheduled yes Provider Appointment booked with Dr. Martinique on 07/19/2017 Friday at 7:00 am  Confirmed with patient if condition begins to worsen call PCP or go to the ER.  Patient was given the office number and encouraged to call back with question or concerns.  : yes

## 2017-07-15 NOTE — Telephone Encounter (Signed)
Continue medications as recommended at the time of ehr discharged, we will adjust medications during follow up. Valtrex has not been filled in this office.  Thanks, BJ

## 2017-07-15 NOTE — Telephone Encounter (Signed)
Pt recently released from hospital, has a follow up with Dr. Martinique on Friday 07/19/2017, patient is currently taking Maxide daily, she forgot to update at the hospital, should she be taking this daily? Also request a refill on Valtrex as soon as possible please to [facility].

## 2017-07-15 NOTE — Telephone Encounter (Signed)
Message sent to Dr. Jordan for review and approval. 

## 2017-07-16 ENCOUNTER — Other Ambulatory Visit: Payer: Self-pay | Admitting: *Deleted

## 2017-07-16 NOTE — Telephone Encounter (Signed)
Patient stated that she really need medication today and cannot come in until her appointment on Friday. She also stated that she doesn't know how the medication is not on her list and the reason she hasn't asked for it before was because she had some left. Patient stated that she was taking the generic brand of Valtrex.

## 2017-07-18 NOTE — Progress Notes (Signed)
HPI:   [name] is a 62 y.o. female, who is here today to follow on recent hospitalization.  She was last seen on 05/08/17 for acute visit.   She was admitted on 07/11/17 and discharged on 07/12/17.  She presented to the ER on 4/4/19with substernal chest pain,pressure. + Nausea and dyspnea. Initial EKG inverted T waves in precordial leads and 2nd EKG was done because pain radiated under left breast, it showed new LBBB. Left heart cath on 11/08/83 normal LV systolic and diastolic function.LVEF 55-65%  Diagnosis on discharge: unstable angina Troponin x 3 negative.  She had similar symptoms in 05/2013. Left heart cath with no significant CAD.   According to patient, he was not recommended to follow with cardiologist.  She is now on daily Aspirin.  She had one episode of mid substernal "little chest pressure", mild, she went to bed with pain.  No associated symptoms, she thinks she ate something that aggravated her GERD.  HTN: Maxzide was discontinued. She is on Amlodipine 5 mg daily and Metoprolol 12.5 mg bid. She has not noted palpitations, diaphoresis, dyspnea, cough, or wheezing.  HypoK+, started on K+ supplementation, she did not take medication instead she is eating bananas daily.  Lab Results  Component Value Date   CREATININE 0.73 07/12/2017   BUN 8 07/12/2017   NA 137 07/12/2017   K 3.2 (L) 07/12/2017   CL 103 07/12/2017   CO2 25 07/12/2017    HLD: She is on Lipitor 20 mg daily..   Lab Results  Component Value Date   CHOL 142 07/12/2017   HDL 42 07/12/2017   LDLCALC 77 07/12/2017   LDLDIRECT 145.2 07/23/2012   TRIG 113 07/12/2017   CHOLHDL 3.4 07/12/2017    She has not smoked since hospital discharge.  For the first couple days after hospital discharge she had some fatigue but now she is feeling "great." She went back to work 5 days ago.   She is also requesting refill on Valtrex, which she took a few years ago for recurrent genital  herpes.  She had not outbreaks in a few years. About a week ago she noted tender, vesicular lesion on external genital.  She has been treated lesion with topical steroid, lesion is healing and pain improved. She has not noted vaginal discharge or bleeding.  Back pain: She is also complaining of intermittent left lower back pain that started a few days ago.  She has history of back pain, intermittently for a couple years. No history of recent trauma. Pain is not radiated, sharp, most of the time 4/10 but can be 10/10 depending of how she moves. She usually takes OTC ibuprofen. No saddle anesthesia, lower extremity numbness or tingling, or associated urine/bowel incontinence. Pain is exacerbated by prolonged walking and now also when standing up after sitting for long time.  She has follow with orthopedist for chronic cervical pain.  Review of Systems  Constitutional: Negative for activity change, appetite change, fatigue and fever.  HENT: Negative for mouth sores, nosebleeds and trouble swallowing.   Eyes: Negative for redness and visual disturbance.  Respiratory: Negative for cough, shortness of breath and wheezing.   Cardiovascular: Negative for palpitations and leg swelling.  Gastrointestinal: Negative for abdominal pain, nausea and vomiting.       Negative for changes in bowel habits.  Genitourinary: Negative for decreased urine volume, dysuria and hematuria.  Musculoskeletal: Positive for back pain. Negative for gait problem.  Skin: Positive for  rash. Negative for wound.  Neurological: Negative for syncope, weakness, numbness and headaches.  Psychiatric/Behavioral: Negative for confusion and sleep disturbance.      Current Outpatient Medications on File Prior to Visit  Medication Sig Dispense Refill  . albuterol (PROVENTIL HFA;VENTOLIN HFA) 108 (90 Base) MCG/ACT inhaler Inhale 2 puffs into the lungs every 6 (six) hours as needed for wheezing or shortness of breath. 1 Inhaler 0  .  amLODipine (NORVASC) 5 MG tablet TAKE 1 TABLET (5 MG TOTAL) BY MOUTH DAILY. 90 tablet 2  . atorvastatin (LIPITOR) 20 MG tablet Take 1 tablet (20 mg total) by mouth daily. 90 tablet 1  . diphenhydrAMINE (SOMINEX) 25 MG tablet Take 25 mg by mouth at bedtime as needed for sleep.    . famotidine (PEPCID) 20 MG tablet Take 1 tablet (20 mg total) by mouth 2 (two) times daily. 30 tablet 0  . fluticasone (FLONASE) 50 MCG/ACT nasal spray Place 1 spray into both nostrils 2 (two) times daily. 16 g 3  . meclizine (ANTIVERT) 25 MG tablet Take 1 tablet (25 mg total) by mouth 3 (three) times daily as needed for dizziness. 30 tablet 2  . metoprolol tartrate (LOPRESSOR) 25 MG tablet Take 0.5 tablets (12.5 mg total) by mouth 2 (two) times daily. 60 tablet 3  . pantoprazole (PROTONIX) 40 MG tablet Take 1 tablet (40 mg total) by mouth daily. 90 tablet 2  . triamcinolone cream (KENALOG) 0.1 % Apply 1 application topically 2 (two) times daily as needed. 45 g 1   No current facility-administered medications on file prior to visit.      Past Medical History:  Diagnosis Date  . GERD (gastroesophageal reflux disease)   . Headache(784.0)   . Hypertension    No Known Allergies  Social History   Socioeconomic History  . Marital status: Divorced    Spouse name: Not on file  . Number of children: 1  . Years of education: College  . Highest education level: Not on file  Occupational History    Comment: [employer]  Social Needs  . Financial resource strain: Not on file  . Food insecurity:    Worry: Not on file    Inability: Not on file  . Transportation needs:    Medical: Not on file    Non-medical: Not on file  Tobacco Use  . Smoking status: Former Smoker    Packs/day: 0.25    Years: 16.00    Pack years: 4.00    Types: Cigarettes  . Smokeless tobacco: Never Used  Substance and Sexual Activity  . Alcohol use: No    Comment: Occassional Use  . Drug use: No  . Sexual activity: Not Currently     Birth control/protection: Post-menopausal  Lifestyle  . Physical activity:    Days per week: Not on file    Minutes per session: Not on file  . Stress: Not on file  Relationships  . Social connections:    Talks on phone: Not on file    Gets together: Not on file    Attends religious service: Not on file    Active member of club or organization: Not on file    Attends meetings of clubs or organizations: Not on file    Relationship status: Not on file  Other Topics Concern  . Not on file  Social History Narrative   Lives at home alone   Right-handed   Drinks decaf coffee    Vitals:   07/19/17 0707  BP:  118/80  Pulse: 70  Resp: 12  Temp: 98 F (36.7 C)  SpO2: 98%   Body mass index is 31.45 kg/m.    Physical Exam  Nursing note and vitals reviewed. Constitutional: She is oriented to person, place, and time. She appears well-developed. No distress.  HENT:  Head: Normocephalic and atraumatic.  Mouth/Throat: Oropharynx is clear and moist and mucous membranes are normal.  Eyes: Pupils are equal, round, and reactive to light. Conjunctivae are normal.  Neck: No JVD present.  Cardiovascular: Normal rate and regular rhythm.  No murmur heard. Pulses:      Dorsalis pedis pulses are 2+ on the right side, and 2+ on the left side.  Respiratory: Effort normal and breath sounds normal. No respiratory distress.  GI: Soft. She exhibits no mass. There is no hepatomegaly. There is no tenderness.  Genitourinary:  Genitourinary Comments: 3 mm erythematous papular lesion on left major labia, mildly tender.  Musculoskeletal: She exhibits no edema.       Lumbar back: She exhibits tenderness. She exhibits normal range of motion.       Back:  Tenderness upon palpation of SI joint left side.  Lymphadenopathy:    She has no cervical adenopathy.  Neurological: She is alert and oriented to person, place, and time. She has normal strength. Coordination and gait normal.  Reflex Scores:       Patellar reflexes are 2+ on the right side and 2+ on the left side. SLR negative bilaterally.  Skin: Skin is warm. No erythema.  Psychiatric: She has a normal mood and affect.  Well groomed, good eye contact.    ASSESSMENT AND PLAN:  Brianna Foster was seen today for hospitalization follow-up.  Orders Placed This Encounter  Procedures  . Potassium    Chest pain, unspecified type  Cardiac cath negative for CAD. Other possible etiologies: Musculoskeletal and GI. We will continue monitoring.  Hypokalemia  Further recommendation will be given according to lab results.  -     Potassium  Left-sided low back pain without sciatica, unspecified chronicity  We discussed side effects of chronic use of NSAIDs. She can use OTC topical icy hot or Aspercreme with lidocaine, local ice, and Tylenol arthritis 3 times per day. If she still having pain 1-2 weeks, recommended follow with her orthopedist.   Essential hypertension Adequately controlled. No changes in current management. DASH-low salt diet recommended. Eye exam recommended annually. F/U in 4 months, before if needed.   Recurrent genital herpes Current episode is improving, since he has been over 48 hours explained that Valtrex will not help. She can continue Valtrex as needed for future outbreaks. Follow-up as needed.  Hyperlipidemia, unspecified No changes in current management, Lipitor 20 mg daily. We discussed the importance of following a healthy, low-fat diet consistently.       Elmo Rio G. Martinique, MD  [facility]. [place] office.

## 2017-07-19 ENCOUNTER — Encounter: Payer: Self-pay | Admitting: Family Medicine

## 2017-07-19 ENCOUNTER — Ambulatory Visit: Payer: 59 | Admitting: Family Medicine

## 2017-07-19 VITALS — BP 118/80 | HR 70 | Temp 98.0°F | Resp 12 | Ht 69.0 in | Wt 213.0 lb

## 2017-07-19 DIAGNOSIS — M545 Low back pain, unspecified: Secondary | ICD-10-CM

## 2017-07-19 DIAGNOSIS — E785 Hyperlipidemia, unspecified: Secondary | ICD-10-CM | POA: Diagnosis not present

## 2017-07-19 DIAGNOSIS — R079 Chest pain, unspecified: Secondary | ICD-10-CM | POA: Diagnosis not present

## 2017-07-19 DIAGNOSIS — E876 Hypokalemia: Secondary | ICD-10-CM | POA: Diagnosis not present

## 2017-07-19 DIAGNOSIS — A6000 Herpesviral infection of urogenital system, unspecified: Secondary | ICD-10-CM

## 2017-07-19 DIAGNOSIS — I10 Essential (primary) hypertension: Secondary | ICD-10-CM

## 2017-07-19 LAB — POTASSIUM: Potassium: 4.4 mEq/L (ref 3.5–5.1)

## 2017-07-19 MED ORDER — VALACYCLOVIR HCL 500 MG PO TABS: ORAL_TABLET | ORAL | 1 refills | Status: DC

## 2017-07-19 NOTE — Assessment & Plan Note (Signed)
Adequately controlled. No changes in current management. DASH-low salt diet recommended. Eye exam recommended annually. F/U in 4 months, before if needed.

## 2017-07-19 NOTE — Assessment & Plan Note (Signed)
No changes in current management, Lipitor 20 mg daily. We discussed the importance of following a healthy, low-fat diet consistently.

## 2017-07-19 NOTE — Patient Instructions (Addendum)
A few things to remember from today's visit:   Hypokalemia - Plan: Potassium  Essential hypertension  Chest pain, unspecified type  No changes today. I will see her back in 4 months. Congratulations before quitting smoking.   I will be sending Valtrex to your pharmacy to take as needed for future outbreaks.    Please be sure medication list is accurate. If a new problem present, please set up appointment sooner than planned today.

## 2017-07-19 NOTE — Assessment & Plan Note (Signed)
Current episode is improving, since he has been over 48 hours explained that Valtrex will not help. She can continue Valtrex as needed for future outbreaks. Follow-up as needed.

## 2017-07-21 ENCOUNTER — Encounter: Payer: Self-pay | Admitting: Family Medicine

## 2017-08-26 DIAGNOSIS — G4733 Obstructive sleep apnea (adult) (pediatric): Secondary | ICD-10-CM | POA: Diagnosis not present

## 2017-08-28 ENCOUNTER — Encounter: Payer: Self-pay | Admitting: Pulmonary Disease

## 2017-08-28 ENCOUNTER — Other Ambulatory Visit: Payer: Self-pay | Admitting: *Deleted

## 2017-08-28 ENCOUNTER — Telehealth: Payer: Self-pay | Admitting: Pulmonary Disease

## 2017-08-28 DIAGNOSIS — R29818 Other symptoms and signs involving the nervous system: Secondary | ICD-10-CM

## 2017-08-28 DIAGNOSIS — G4733 Obstructive sleep apnea (adult) (pediatric): Secondary | ICD-10-CM

## 2017-08-28 HISTORY — DX: Obstructive sleep apnea (adult) (pediatric): G47.33

## 2017-08-28 NOTE — Telephone Encounter (Signed)
HST 08/26/17 >> AHI 30.3, SaO2 low 71%.   Will have my nurse inform pt that sleep study shows severe sleep apnea.  Options are 1) CPAP now, 2) ROV first.  If She is agreeable to CPAP, then please send order for auto CPAP range 5 to 15 cm H2O with heated humidity and mask of choice.  Have download sent 1 month after starting CPAP and set up ROV 2 months after starting CPAP.  ROV can be with me or NP.

## 2017-08-30 DIAGNOSIS — G4733 Obstructive sleep apnea (adult) (pediatric): Secondary | ICD-10-CM | POA: Diagnosis not present

## 2017-09-05 NOTE — Telephone Encounter (Signed)
Left voice mail on machine for patient to return phone call back regarding results. X1  

## 2017-09-09 NOTE — Telephone Encounter (Signed)
Patient is returning call. CB is [phone]

## 2017-09-09 NOTE — Telephone Encounter (Signed)
Returned Patient call.  Gave her VS results and recommendations from sleep test.  Patient stated understanding and she stated that she would call for follow up appointment for 2 months after starting CPAP.  Order placed.  Nothing further needed at this time.

## 2017-09-28 ENCOUNTER — Other Ambulatory Visit: Payer: Self-pay | Admitting: Family Medicine

## 2017-09-28 DIAGNOSIS — K2100 Gastro-esophageal reflux disease with esophagitis, without bleeding: Secondary | ICD-10-CM

## 2017-10-01 NOTE — Telephone Encounter (Signed)
She needs a follow-up appointment in 11/2017 to follow-up on hypertension. Thanks, BJ

## 2017-10-01 NOTE — Telephone Encounter (Signed)
Last physical 04/2016.  Called pt to schedule.  She states she was told that she does not need to come in when seen 04/2017.  Please advise.  Thanks!!

## 2017-12-02 ENCOUNTER — Other Ambulatory Visit: Payer: Self-pay | Admitting: Family Medicine

## 2017-12-02 DIAGNOSIS — K2100 Gastro-esophageal reflux disease with esophagitis, without bleeding: Secondary | ICD-10-CM

## 2018-02-05 ENCOUNTER — Other Ambulatory Visit: Payer: Self-pay | Admitting: Family Medicine

## 2018-02-05 DIAGNOSIS — I10 Essential (primary) hypertension: Secondary | ICD-10-CM

## 2018-03-14 ENCOUNTER — Other Ambulatory Visit: Payer: Self-pay | Admitting: *Deleted

## 2018-03-17 MED ORDER — METOPROLOL TARTRATE 25 MG PO TABS: 12.5000 mg | ORAL_TABLET | Freq: Two times a day (BID) | ORAL | 3 refills | Status: DC

## 2018-03-18 ENCOUNTER — Other Ambulatory Visit: Payer: Self-pay | Admitting: Family Medicine

## 2018-03-18 DIAGNOSIS — K2100 Gastro-esophageal reflux disease with esophagitis, without bleeding: Secondary | ICD-10-CM

## 2018-05-07 ENCOUNTER — Other Ambulatory Visit: Payer: Self-pay | Admitting: Family Medicine

## 2018-05-07 DIAGNOSIS — I10 Essential (primary) hypertension: Secondary | ICD-10-CM

## 2018-05-14 ENCOUNTER — Telehealth: Payer: Self-pay | Admitting: Pulmonary Disease

## 2018-05-14 NOTE — Telephone Encounter (Signed)
Spoke to Ballinger at [facility].  [name] stated pt needs a new OV and order for CPAP and supplies.  [name] did say she received the sleep study.  Attempted to call pt and received a busy signal x3.  Will try later.

## 2018-05-15 NOTE — Telephone Encounter (Signed)
ATC pt x4 and received busy signal. Will call back.

## 2018-05-16 NOTE — Telephone Encounter (Signed)
ATC pt, received a busy signal x2. °Will try back. °

## 2018-05-19 NOTE — Telephone Encounter (Signed)
We have attempted to contact pt several times with no success. Per triage protocol, message will be closed.  

## 2018-05-25 ENCOUNTER — Encounter (HOSPITAL_COMMUNITY): Payer: Self-pay | Admitting: Emergency Medicine

## 2018-05-25 ENCOUNTER — Other Ambulatory Visit: Payer: Self-pay

## 2018-05-25 ENCOUNTER — Emergency Department (HOSPITAL_COMMUNITY): Payer: 59

## 2018-05-25 ENCOUNTER — Emergency Department (HOSPITAL_COMMUNITY)
Admission: EM | Admit: 2018-05-25 | Discharge: 2018-05-25 | Disposition: A | Discharge status: Home / Self Care | Payer: 59 | Attending: Emergency Medicine | Admitting: Emergency Medicine

## 2018-05-25 DIAGNOSIS — Z79899 Other long term (current) drug therapy: Secondary | ICD-10-CM | POA: Insufficient documentation

## 2018-05-25 DIAGNOSIS — I10 Essential (primary) hypertension: Secondary | ICD-10-CM | POA: Diagnosis not present

## 2018-05-25 DIAGNOSIS — R079 Chest pain, unspecified: Secondary | ICD-10-CM | POA: Insufficient documentation

## 2018-05-25 DIAGNOSIS — Z87891 Personal history of nicotine dependence: Secondary | ICD-10-CM | POA: Diagnosis not present

## 2018-05-25 LAB — CBC
HEMATOCRIT: 40.3 % (ref 36.0–46.0)
Hemoglobin: 13.7 g/dL (ref 12.0–15.0)
MCH: 30.4 pg (ref 26.0–34.0)
MCHC: 34 g/dL (ref 30.0–36.0)
MCV: 89.4 fL (ref 80.0–100.0)
Platelets: 272 10*3/uL (ref 150–400)
RBC: 4.51 MIL/uL (ref 3.87–5.11)
RDW: 11.9 % (ref 11.5–15.5)
WBC: 5.9 10*3/uL (ref 4.0–10.5)
nRBC: 0 % (ref 0.0–0.2)

## 2018-05-25 LAB — BASIC METABOLIC PANEL
ANION GAP: 10 (ref 5–15)
BUN: 9 mg/dL (ref 8–23)
CALCIUM: 8.8 mg/dL — AB (ref 8.9–10.3)
CHLORIDE: 104 mmol/L (ref 98–111)
CO2: 23 mmol/L (ref 22–32)
Creatinine, Ser: 0.69 mg/dL (ref 0.44–1.00)
GLUCOSE: 91 mg/dL (ref 70–99)
POTASSIUM: 3.4 mmol/L — AB (ref 3.5–5.1)
Sodium: 137 mmol/L (ref 135–145)

## 2018-05-25 LAB — I-STAT TROPONIN, ED
Troponin i, poc: 0 ng/mL (ref 0.00–0.08)
Troponin i, poc: 0.01 ng/mL (ref 0.00–0.08)

## 2018-05-25 MED ORDER — ALUM & MAG HYDROXIDE-SIMETH 200-200-20 MG/5ML PO SUSP
30.0000 mL | Freq: Once | ORAL | Status: AC
  Administered 2018-05-25: 30 mL via ORAL
  Filled 2018-05-25: qty 30

## 2018-05-25 MED ORDER — NITROGLYCERIN 0.4 MG SL SUBL
0.4000 mg | SUBLINGUAL_TABLET | SUBLINGUAL | Status: DC | PRN
  Administered 2018-05-25 (×2): 0.4 mg via SUBLINGUAL

## 2018-05-25 MED ORDER — ASPIRIN 81 MG PO CHEW
324.0000 mg | CHEWABLE_TABLET | Freq: Once | ORAL | Status: AC
  Administered 2018-05-25: 324 mg via ORAL
  Filled 2018-05-25: qty 4

## 2018-05-25 MED ORDER — LIDOCAINE VISCOUS HCL 2 % MT SOLN
15.0000 mL | Freq: Once | OROMUCOSAL | Status: AC
  Administered 2018-05-25: 15 mL via ORAL
  Filled 2018-05-25: qty 15

## 2018-05-25 MED ORDER — NITROGLYCERIN 0.4 MG SL SUBL
SUBLINGUAL_TABLET | SUBLINGUAL | Status: AC
  Administered 2018-05-25: 0.4 mg via SUBLINGUAL
  Filled 2018-05-25: qty 1

## 2018-05-25 NOTE — ED Triage Notes (Signed)
Woke up this am with chest pressure-- drove from [place] today -  C/o diaphoresis, nausea, midsternal chest pain, 9/10 - had similar experience April 2019. Had cath with no stents.

## 2018-05-25 NOTE — Discharge Instructions (Addendum)
Your evaluation here did not show any acute or cardiac cause of chest pain. You appear stable for disharge Please return if you are worse at any time REcheck with your doctor this week.

## 2018-05-25 NOTE — ED Provider Notes (Signed)
[facility] Provider Note   CSN: [id number] Arrival date & time: 05/25/18  1716     History   Chief Complaint Chief Complaint  Patient presents with  . Chest Pain    HPI Brianna Foster is a 63 y.o. female.  HPI  63 year old female running of substernal chest pain that began at 9:30 in the morning which is greater than 6 hours prior to arrival.  Has been present all day.  Is 9 out of 10.  She describes it as burning and pressure-like.  She has had some associated shortness of breath but no diaphoresis.  She states this is similar to pain she had previously when she was seen and had a catheterization performed last April.  Denies any history of DVT or PE.  She has not had any injury to her chest.  She denies fever or cough.  Past Medical History:  Diagnosis Date  . GERD (gastroesophageal reflux disease)   . Headache(784.0)   . Hypertension   . OSA (obstructive sleep apnea) 08/28/2017    Patient Active Problem List   Diagnosis Date Noted  . OSA (obstructive sleep apnea) 08/28/2017  . Recurrent genital herpes 07/19/2017  . Unstable angina ([place]) 07/11/2017  . De Quervain's tenosynovitis, right 05/08/2016  . Cervical radiculopathy 05/08/2016  . Benign paroxysmal positional vertigo 03/20/2016  . Eczema 03/20/2016  . Hyperlipidemia, unspecified 03/20/2016  . Vertigo 10/28/2015  . Headache 10/28/2015  . Tobacco abuse 07/23/2012  . Gastroesophageal reflux disease 06/18/2011  . Essential hypertension 06/18/2011    Past Surgical History:  Procedure Laterality Date  . LEFT HEART CATH AND CORONARY ANGIOGRAPHY N/A 07/11/2017   Procedure: LEFT HEART CATH AND CORONARY ANGIOGRAPHY;  Surgeon: Martinique, Peter M, MD;  Location: [facility];  Service: Cardiovascular;  Laterality: N/A;  . LEFT HEART CATHETERIZATION WITH CORONARY ANGIOGRAM N/A 05/11/2013   Procedure: LEFT HEART CATHETERIZATION WITH CORONARY ANGIOGRAM;  Surgeon: Burnell Blanks, MD;  Location: [facility] CATH LAB;  Service: Cardiovascular;  Laterality: N/A;  . neck fusion       OB History   No obstetric history on file.      Home Medications    Prior to Admission medications   Medication Sig Start Date End Date Taking? Authorizing Provider  amLODipine (NORVASC) 5 MG tablet TAKE 1 TABLET BY MOUTH EVERY DAY Patient taking differently: Take 5 mg by mouth daily.  05/08/18  Yes Martinique, Betty G, MD  atorvastatin (LIPITOR) 20 MG tablet Take 1 tablet (20 mg total) by mouth daily. 05/15/17  Yes Martinique, Betty G, MD  fluticasone [employer]) 50 MCG/ACT nasal spray Place 1 spray into both nostrils 2 (two) times daily. Patient taking differently: Place 1 spray into both nostrils 2 (two) times daily as needed for allergies or rhinitis.  05/08/17  Yes Martinique, Betty G, MD  meclizine (ANTIVERT) 25 MG tablet Take 1 tablet (25 mg total) by mouth 3 (three) times daily as needed for dizziness. 05/08/17  Yes Martinique, Betty G, MD  metoprolol tartrate (LOPRESSOR) 25 MG tablet Take 0.5 tablets (12.5 mg total) by mouth 2 (two) times daily. 03/17/18  Yes Lelon Perla, MD  pantoprazole (PROTONIX) 40 MG tablet TAKE 1 TABLET (40 MG TOTAL) BY MOUTH DAILY. 03/18/18  Yes Martinique, Betty G, MD  triamcinolone cream (KENALOG) 0.1 % Apply 1 application topically 2 (two) times daily as needed. Patient taking differently: Apply 1 application topically 2 (two) times daily as needed (to affected sites- for  itching).  03/20/16  Yes Martinique, Betty G, MD  valACYclovir (VALTREX) 500 MG tablet 1 tab bid for 3 days with outbreaks, start within 48 hours after onset. Patient taking differently: Take 500 mg by mouth See admin instructions. Take 500 mg by mouth two times a day for 3 days with outbreaks/start within 48 hours after onset 07/19/17  Yes Martinique, Betty G, MD  albuterol (PROVENTIL HFA;VENTOLIN HFA) 108 (90 Base) MCG/ACT inhaler Inhale 2 puffs into the lungs every 6 (six) hours as needed for wheezing or shortness  of breath. Patient not taking: Reported on 05/25/2018 05/08/17   Martinique, Betty G, MD  famotidine (PEPCID) 20 MG tablet Take 1 tablet (20 mg total) by mouth 2 (two) times daily. Patient not taking: Reported on 05/25/2018 05/02/16   Fredia Sorrow, MD  pantoprazole (PROTONIX) 40 MG tablet Take 1 tablet (40 mg total) by mouth daily. Patient not taking: Reported on 05/25/2018 12/11/16   Martinique, Betty G, MD    Family History Family History  Problem Relation Age of Onset  . Lung cancer Mother   . Stomach cancer Father   . Heart disease Brother        Pacemaker  . Esophageal cancer Brother   . Breast cancer Sister     Social History Social History   Tobacco Use  . Smoking status: Former Smoker    Packs/day: 0.25    Years: 16.00    Pack years: 4.00    Types: Cigarettes  . Smokeless tobacco: Never Used  Substance Use Topics  . Alcohol use: No    Comment: Occassional Use  . Drug use: No     Allergies   Other   Review of Systems Review of Systems  All other systems reviewed and are negative.    Physical Exam Updated Vital Signs BP 134/73   Pulse (!) 58   Temp 98.2 F (36.8 C) (Oral)   Resp 15   Ht 1.753 m (5\' 9" )   Wt 100.2 kg   LMP 01/07/2010 (LMP Unknown)   SpO2 96%   BMI 32.64 kg/m   Physical Exam Vitals signs and nursing note reviewed.  Constitutional:      Appearance: She is well-developed. She is obese.  HENT:     Head: Atraumatic.  Eyes:     Pupils: Pupils are equal, round, and reactive to light.  Neck:     Musculoskeletal: Normal range of motion.  Cardiovascular:     Rate and Rhythm: Normal rate and regular rhythm.     Pulses:          Carotid pulses are 1+ on the right side and 1+ on the left side.      Radial pulses are 1+ on the right side and 1+ on the left side.       Dorsalis pedis pulses are 1+ on the right side and 1+ on the left side.       Posterior tibial pulses are 1+ on the right side and 1+ on the left side.     Heart sounds: Normal  heart sounds.  Pulmonary:     Effort: Pulmonary effort is normal.     Breath sounds: Normal breath sounds.  Abdominal:     General: Bowel sounds are normal.     Palpations: Abdomen is soft.  Musculoskeletal: Normal range of motion.     Right lower leg: She exhibits no tenderness. No edema.     Left lower leg: She exhibits no tenderness. No edema.  Skin:    General: Skin is warm and dry.     Capillary Refill: Capillary refill takes less than 2 seconds.  Neurological:     General: No focal deficit present.     Mental Status: She is alert.  Psychiatric:        Mood and Affect: Mood normal.      ED Treatments / Results  Labs (all labs ordered are listed, but only abnormal results are displayed) Labs Reviewed  BASIC METABOLIC PANEL - Abnormal; Notable for the following components:      Result Value   Potassium 3.4 (*)    Calcium 8.8 (*)    All other components within normal limits  CBC  I-STAT TROPONIN, ED  CBG MONITORING, ED    EKG EKG Interpretation  Date/Time:  Sunday May 25 2018 17:28:06 EST Ventricular Rate:  71 PR Interval:    QRS Duration: 102 QT Interval:  411 QTC Calculation: 447 R Axis:   19 Text Interpretation:  Normal sinus rhythm T wave abnormality has resolved since first prior tracing Confirmed by Pattricia Boss [phone]) on 05/25/2018 7:11:47 PM   Radiology Dg Chest Portable 1 View  Result Date: 05/25/2018 CLINICAL DATA:  Woke up this am with chest pressure-- drove from [place] today - C/o diaphoresis, nausea, midsternal chest pain, 9/10 - had similar experience April 2019. Had cath with no stents. chest pain EXAM: PORTABLE CHEST 1 VIEW COMPARISON:  None. FINDINGS: Normal mediastinum and cardiac silhouette. Normal pulmonary vasculature. No evidence of effusion, infiltrate, or pneumothorax. No acute bony abnormality. Anterior cervical fusion. IMPRESSION: No acute cardiopulmonary process. Electronically Signed   By: Suzy Bouchard M.D.   On: 05/25/2018  18:22    Procedures Procedures (including critical care time)  Medications Ordered in ED Medications  nitroGLYCERIN (NITROSTAT) SL tablet 0.4 mg (0.4 mg Sublingual Given 05/25/18 1806)  alum & mag hydroxide-simeth (MAALOX/MYLANTA) 200-200-20 MG/5ML suspension 30 mL (has no administration in time range)    And  lidocaine (XYLOCAINE) 2 % viscous mouth solution 15 mL (has no administration in time range)  aspirin chewable tablet 324 mg (324 mg Oral Given 05/25/18 1744)     Initial Impression / Assessment and Plan / ED Course  I have reviewed the triage vital signs and the nursing notes.  Pertinent labs & imaging results that were available during my care of the patient were reviewed by me and considered in my medical decision making (see chart for details).     Left Main  Vessel was injected. Vessel is normal in caliber. Vessel is angiographically normal.  Left Anterior Descending  Vessel was injected. Vessel is normal in caliber. Vessel is angiographically normal.  Ramus Intermedius  Vessel was injected. Vessel is large. Vessel is angiographically normal.  Left Circumflex  Vessel was injected. Vessel is normal in caliber. Vessel is angiographically normal.  Right Coronary Artery  Vessel was injected. Vessel is normal in caliber. Vessel is angiographically normal.  Intervention   No interventions have been documented.  Wall Motion              Left Heart   Left Ventricle The left ventricular size is normal. The left ventricular systolic function is normal. LV end diastolic pressure is normal. The left ventricular ejection fraction is 55-65% by visual estimate. No regional wall motion abnormalities.  Coronary Diagrams   Diagnostic  Dominance: Right    Intervention   Implants      She here with chest pain.  Initial troponin is  normal.  She had some pain relief with nitro and is given a GI cocktail.  I reviewed her previous evaluations that include rectal temperature  obtained and is April of 2019 a cardiac catheterization last April which was normal Plan recheck troponin and patient will be discharged to home 8:22 PM Patient with resolution of pain after GI cocktail Repeat troponin normal Patient discharged home in improved condition.  We have discussed return precautions and need for follow-up and she voices understanding. Final Clinical Impressions(s) / ED Diagnoses   Final diagnoses:  Chest pain, unspecified type    ED Discharge Orders    None       Pattricia Boss, MD 05/25/18 2023

## 2018-05-25 NOTE — ED Notes (Signed)
Patient verbalizes understanding of medications and discharge instructions. No further questions at this time. VSS and patient ambulatory at discharge.   

## 2018-06-23 ENCOUNTER — Ambulatory Visit: Payer: 59 | Admitting: Family Medicine

## 2018-06-23 ENCOUNTER — Other Ambulatory Visit: Payer: Self-pay

## 2018-06-23 ENCOUNTER — Encounter: Payer: Self-pay | Admitting: Family Medicine

## 2018-06-23 ENCOUNTER — Encounter: Payer: Self-pay | Admitting: *Deleted

## 2018-06-23 VITALS — BP 122/72 | HR 60 | Temp 98.2°F | Resp 12 | Ht 69.0 in | Wt 227.2 lb

## 2018-06-23 DIAGNOSIS — E876 Hypokalemia: Secondary | ICD-10-CM

## 2018-06-23 DIAGNOSIS — H8110 Benign paroxysmal vertigo, unspecified ear: Secondary | ICD-10-CM | POA: Diagnosis not present

## 2018-06-23 DIAGNOSIS — J309 Allergic rhinitis, unspecified: Secondary | ICD-10-CM | POA: Insufficient documentation

## 2018-06-23 DIAGNOSIS — J301 Allergic rhinitis due to pollen: Secondary | ICD-10-CM | POA: Diagnosis not present

## 2018-06-23 DIAGNOSIS — R42 Dizziness and giddiness: Secondary | ICD-10-CM | POA: Diagnosis not present

## 2018-06-23 DIAGNOSIS — I10 Essential (primary) hypertension: Secondary | ICD-10-CM

## 2018-06-23 MED ORDER — MECLIZINE HCL 25 MG PO TABS: 25.0000 mg | ORAL_TABLET | Freq: Three times a day (TID) | ORAL | 2 refills | Status: DC | PRN

## 2018-06-23 MED ORDER — AMLODIPINE BESYLATE 5 MG PO TABS: 5.0000 mg | ORAL_TABLET | Freq: Every day | ORAL | 2 refills | Status: DC

## 2018-06-23 MED ORDER — FLUTICASONE PROPIONATE 50 MCG/ACT NA SUSP: 1.0000 | Freq: Two times a day (BID) | NASAL | 5 refills | Status: DC | PRN

## 2018-06-23 NOTE — Patient Instructions (Addendum)
A few things to remember from today's visit:   Vertigo  Essential hypertension - Plan: amLODipine (NORVASC) 5 MG tablet  Seasonal allergic rhinitis due to pollen  Benign paroxysmal positional vertigo, unspecified laterality - Plan: meclizine (ANTIVERT) 25 MG tablet Fall precautions. For 1 to 2 days takes meclizine every 8 hours.  Nasal irrigation with saline several times per day. Start Flonase nasal spray. Over-the-counter antihistaminic may also help. Plain Mucinex with Sudafed or Mucinex D for 7 to 10 days. Monitor blood pressure, decongestant can increase blood pressure numbers.   Please be sure medication list is accurate. If a new problem present, please set up appointment sooner than planned today.

## 2018-06-23 NOTE — Progress Notes (Signed)
ACUTE VISIT  HPI:  Chief Complaint  Patient presents with   Headache    x2 days, used Sudafed iwth some relief   Facial Pain   Dizziness    used OTC Dramamine with relief    Brianna Foster is a 63 y.o.female here today complaining of 2 days of respiratory symptoms and dizziness.  She describes dizziness as a spinning sensation, it happens when she is in bed, getting up, and rapid head movement. No associated hearing changes, nausea, vomiting. She took OTC Dramamine. In the past she has taken meclizine for vertigo. Episodes are similar to symptoms she has had in the past for vertigo.  Mild pressure-like headache. She is not sure about exacerbating or alleviating factors. It is not associated with visual changes, focal neurologic deficit.  Headache   This is a new problem. The current episode started in the past 7 days. The problem has been waxing and waning. The pain is located in the frontal region. The pain quality is similar to prior headaches. The pain is at a severity of 1/10. Associated symptoms include coughing, dizziness, ear pain, rhinorrhea, sinus pressure and tinnitus (chronic). Pertinent negatives include no abdominal pain, blurred vision, eye pain, eye redness, eye watering, facial sweating, fever, hearing loss, insomnia, muscle aches, nausea, neck pain, numbness, phonophobia, photophobia, scalp tenderness, sore throat, swollen glands, visual change, vomiting or weakness. The symptoms are aggravated by coughing. Her past medical history is significant for hypertension and sinus disease.  Dizziness  This is a recurrent problem. The current episode started in the past 7 days. The problem occurs intermittently. The problem has been gradually improving. Associated symptoms include congestion, coughing, headaches and vertigo. Pertinent negatives include no abdominal pain, chest pain, fatigue, fever, myalgias, nausea, neck pain, numbness, rash, sore throat,  swollen glands, visual change, vomiting or weakness. The symptoms are aggravated by bending and twisting. Treatments tried: Dramamine. The treatment provided mild relief.   No Hx of recent travel. Some coworkers have been sick. No known insect bite.  Hx of allergies: Allergy rhinitis, currently she is not on treatment.  OTC medications for this problem: Sudafed.  Hypertension: Currently she is on amlodipine 10 mg daily and metoprolol tartrate 25 mg 0.5 tablet twice daily. Since her last visit, 07/19/2017, she has follow-up with pulmonologist, Dx with OSA and on CPAP.   She is not checking BPs at home. She denies chest pain, dyspnea, palpitations, or edema.  Lab Results  Component Value Date   CREATININE 0.69 05/25/2018   BUN 9 05/25/2018   NA 137 05/25/2018   K 3.4 (L) 05/25/2018   CL 104 05/25/2018   CO2 23 05/25/2018    Review of Systems  Constitutional: Negative for fatigue and fever.  HENT: Positive for congestion, ear pain, rhinorrhea, sinus pressure and tinnitus (chronic). Negative for hearing loss and sore throat.   Eyes: Negative for blurred vision, photophobia, pain and redness.  Respiratory: Positive for cough. Negative for shortness of breath and wheezing.   Cardiovascular: Negative for chest pain, palpitations and leg swelling.  Gastrointestinal: Negative for abdominal pain, nausea and vomiting.  Genitourinary: Negative for decreased urine volume and hematuria.  Musculoskeletal: Negative for myalgias and neck pain.  Skin: Negative for rash.  Allergic/Immunologic: Positive for environmental allergies.  Neurological: Positive for dizziness, vertigo and headaches. Negative for weakness and numbness.  Psychiatric/Behavioral: Negative for confusion. The patient is nervous/anxious. The patient does not have insomnia.     Current Outpatient Medications  on File Prior to Visit  Medication Sig Dispense Refill   albuterol (PROVENTIL HFA;VENTOLIN HFA) 108 (90 Base)  MCG/ACT inhaler Inhale 2 puffs into the lungs every 6 (six) hours as needed for wheezing or shortness of breath. 1 Inhaler 0   atorvastatin (LIPITOR) 20 MG tablet Take 1 tablet (20 mg total) by mouth daily. 90 tablet 1   famotidine (PEPCID) 20 MG tablet Take 1 tablet (20 mg total) by mouth 2 (two) times daily. 30 tablet 0   metoprolol tartrate (LOPRESSOR) 25 MG tablet Take 0.5 tablets (12.5 mg total) by mouth 2 (two) times daily. 60 tablet 3   pantoprazole (PROTONIX) 40 MG tablet Take 1 tablet (40 mg total) by mouth daily. 90 tablet 2   pantoprazole (PROTONIX) 40 MG tablet TAKE 1 TABLET (40 MG TOTAL) BY MOUTH DAILY. 30 tablet 2   triamcinolone cream (KENALOG) 0.1 % Apply 1 application topically 2 (two) times daily as needed. (Patient taking differently: Apply 1 application topically 2 (two) times daily as needed (to affected sites- for itching). ) 45 g 1   valACYclovir (VALTREX) 500 MG tablet 1 tab bid for 3 days with outbreaks, start within 48 hours after onset. (Patient taking differently: Take 500 mg by mouth See admin instructions. Take 500 mg by mouth two times a day for 3 days with outbreaks/start within 48 hours after onset) 18 tablet 1   No current facility-administered medications on file prior to visit.      Past Medical History:  Diagnosis Date   GERD (gastroesophageal reflux disease)    Headache(784.0)    Hypertension    OSA (obstructive sleep apnea) 08/28/2017   Allergies  Allergen Reactions   Other Anaphylaxis, Shortness Of Breath and Swelling    [place] NUTS    Social History   Socioeconomic History   Marital status: Divorced    Spouse name: Not on file   Number of children: 1   Years of education: College   Highest education level: Not on file  Occupational History    Comment: Glass blower/designer  Social Needs   Financial resource strain: Not on file   Food insecurity:    Worry: Not on file    Inability: Not on file   Transportation needs:     Medical: Not on file    Non-medical: Not on file  Tobacco Use   Smoking status: Former Smoker    Packs/day: 0.25    Years: 16.00    Pack years: 4.00    Types: Cigarettes   Smokeless tobacco: Never Used  Substance and Sexual Activity   Alcohol use: No    Comment: Occassional Use   Drug use: No   Sexual activity: Not Currently    Birth control/protection: Post-menopausal  Lifestyle   Physical activity:    Days per week: Not on file    Minutes per session: Not on file   Stress: Not on file  Relationships   Social connections:    Talks on phone: Not on file    Gets together: Not on file    Attends religious service: Not on file    Active member of club or organization: Not on file    Attends meetings of clubs or organizations: Not on file    Relationship status: Not on file  Other Topics Concern   Not on file  Social History Narrative   Lives at home alone   Right-handed   Drinks decaf coffee    Vitals:   06/23/18 [phone]  BP: 122/72  Pulse: 60  Resp: 12  Temp: 98.2 F (36.8 C)  SpO2: 98%   Body mass index is 33.55 kg/m.  Physical Exam  Nursing note and vitals reviewed. Constitutional: She is oriented to person, place, and time. She appears well-developed. She does not appear ill. No distress.  HENT:  Head: Normocephalic and atraumatic.  Right Ear: Tympanic membrane, external ear and ear canal normal.  Left Ear: External ear and ear canal normal. Tympanic membrane is not erythematous. A middle ear effusion is present.  Nose: Rhinorrhea present. Right sinus exhibits no maxillary sinus tenderness and no frontal sinus tenderness. Left sinus exhibits no maxillary sinus tenderness and no frontal sinus tenderness.  Mouth/Throat: Oropharynx is clear and moist and mucous membranes are normal.  Hypertrophic turbinates. Postnasal drainage.  Refuse apple maneuver.  Eyes: Pupils are equal, round, and reactive to light. Conjunctivae and EOM are normal.    Cardiovascular: Normal rate and regular rhythm.  No murmur heard. Respiratory: Effort normal and breath sounds normal. No respiratory distress.  Lymphadenopathy:       Head (right side): No submandibular adenopathy present.       Head (left side): No submandibular adenopathy present.    She has no cervical adenopathy.  Neurological: She is alert and oriented to person, place, and time. She has normal strength. No cranial nerve deficit. Gait normal.  Reflex Scores:      Patellar reflexes are 2+ on the right side and 2+ on the left side. Skin: Skin is warm. No rash noted. No erythema.  Psychiatric: She has a normal mood and affect.  Well groomed, good eye contact.    ASSESSMENT AND PLAN:   Ms. Lucrezia was seen today for headache, facial pain and dizziness.  Diagnoses and all orders for this visit:  Vertigo Possible etiologies discussed. Instructed about warning signs.  Hypokalemia Mild. Continue potassium rich diet. It can be rechecked with next blood work.  Essential hypertension Adequately controlled. No changes in amlodipine or metoprolol. Continue low-salt diet.  Vertigo Dizziness she reported today suggest vertigo. We discussed signs, symptoms, and treatment options. Side effects of meclizine reviewed. Fall precautions. Instructed about warning signs. Follow-up as needed.  Rhinitis, allergic I do not think she has a bacterial sinusitis, so antibiotic is not necessary at this time. Nasal saline irrigations as needed. Resume Flonase nasal spray. Continue Sudafed for 7 to 10 days or Mucinex D for 7 to 10 days, we discussed side effects. OTC antihistaminic may also help. Follow-up as needed.    Return if symptoms worsen or fail to improve.    Oseas Detty G. Martinique, MD  [facility]. [place] office.

## 2018-06-23 NOTE — Assessment & Plan Note (Signed)
I do not think she has a bacterial sinusitis, so antibiotic is not necessary at this time. Nasal saline irrigations as needed. Resume Flonase nasal spray. Continue Sudafed for 7 to 10 days or Mucinex D for 7 to 10 days, we discussed side effects. OTC antihistaminic may also help. Follow-up as needed.

## 2018-06-23 NOTE — Assessment & Plan Note (Signed)
Adequately controlled. No changes in amlodipine or metoprolol. Continue low-salt diet.

## 2018-06-23 NOTE — Assessment & Plan Note (Signed)
Dizziness she reported today suggest vertigo. We discussed signs, symptoms, and treatment options. Side effects of meclizine reviewed. Fall precautions. Instructed about warning signs. Follow-up as needed.

## 2018-07-01 ENCOUNTER — Telehealth: Payer: Self-pay | Admitting: Pulmonary Disease

## 2018-07-01 NOTE — Telephone Encounter (Signed)
Sent message to [facility] to see what happened to this order from June 2019

## 2018-07-01 NOTE — Telephone Encounter (Signed)
Called and spoke with patient she is aware and verbalized understanding. Nothing further needed.  

## 2018-07-01 NOTE — Telephone Encounter (Signed)
This is being processed and [facility] is waiting on old sleep study to be found from her old paper chart will contact pt as soon as they have it Brianna Foster

## 2018-07-01 NOTE — Telephone Encounter (Signed)
Returned call to patient: Order placed for new cpap in June.  DME:[facility] now [facility]. Pt states she never heard from them and is inquiring about how she will get her cpap. Please advise.

## 2018-07-05 ENCOUNTER — Other Ambulatory Visit: Payer: Self-pay | Admitting: Family Medicine

## 2018-07-05 DIAGNOSIS — K2100 Gastro-esophageal reflux disease with esophagitis, without bleeding: Secondary | ICD-10-CM

## 2018-07-22 ENCOUNTER — Telehealth: Payer: Self-pay | Admitting: Pulmonary Disease

## 2018-07-22 NOTE — Telephone Encounter (Signed)
Patient would like recommendations for sleep apnea until she receives a cpap machine. Patient is aware she will have to repeat the sleep study and in office visit once Covid-19 restrictions are lifted. Sleep study printed.   Beth please advise. Thanks.

## 2018-07-22 NOTE — Telephone Encounter (Signed)
Brianna Foster, Brianna Foster        This pt wants to start the process over after covid-19 has settled down however wants advise as to what she can do until then will send a message to triage to see what a NP can suggest will make a message out of this   Previous Messages    ----- Message -----  From: Harland German  Sent: 07/22/2018  9:05 AM EDT  To: [name]  Subject: FW: cpap                     Lib I worked on this the other day and just not got to see the message.  ----- Message -----  From: Elon Alas  Sent: 07/16/2018 12:36 PM EDT  To: Linward Natal  Subject: RE: cpap                     Okay, I went back to our old system and we had orders in June 2019 for a CPAP and the patient declined at that time.   Unfortunately, this patient will have to start the process all over again as her current paperwork is too old per her insurance guidelines. A new OV and sleep study will need to be scheduled.   ----- Message -----  From: Harland German  Sent: 07/16/2018 12:06 PM EDT  To: Elon Alas  Subject: RE: cpap                     This patient called in this morning stated that [facility] needed [place] to resend the order so that is what I did. Thanks  ----- Message -----  From: Elon Alas  Sent: 07/16/2018 11:55 AM EDT  To: Darlina Guys, Elon Alas, *  Subject: RE: cpap                     Is this a different [name]? I processed an order for a Replacement CPAP from Dr. [name] for a patient with this name on 3/24 with a different DOB (11/27/1953). She was originally scheduled for set up on 4/3, but she is now going to do it via a telepap service and is scheduled for 5/22. Please advise if this is a different patient.    ----- Message -----  From: Darlina Guys  Sent: 07/16/2018 11:00 AM EDT  To: Darlina Guys, Elon Alas, *  Subject: RE:  cpap                     Thanks [name]!  forwarding to [name]   ----- Message -----  From: Harland German  Sent: 07/16/2018  9:51 AM EDT  To: Darlina Guys, [name]  Subject: cpap                       20-Mar-1956  Order placed by Dr [name] please advise. Thanks [name]  Pt called stating that [facility] needed it resent.

## 2018-07-22 NOTE — Telephone Encounter (Signed)
Work on weight loss, stay active, sleep on her side. Avoid sedating medication, tobacco and alcohol prior to bedtime. There are some over the counter options for nasal or oral devices she could try.

## 2018-07-22 NOTE — Telephone Encounter (Signed)
Call made to patient, made aware of recommendations. Voiced understanding. Recall placed to have patient repeat OV and sleep study. Nothing further is needed at this time.

## 2018-08-10 ENCOUNTER — Other Ambulatory Visit: Payer: Self-pay | Admitting: Family Medicine

## 2018-08-10 DIAGNOSIS — A6000 Herpesviral infection of urogenital system, unspecified: Secondary | ICD-10-CM

## 2018-08-22 ENCOUNTER — Ambulatory Visit (INDEPENDENT_AMBULATORY_CARE_PROVIDER_SITE_OTHER): Payer: 59 | Admitting: Family Medicine

## 2018-08-22 ENCOUNTER — Ambulatory Visit: Payer: Self-pay | Admitting: Family Medicine

## 2018-08-22 ENCOUNTER — Other Ambulatory Visit: Payer: Self-pay

## 2018-08-22 ENCOUNTER — Encounter: Payer: Self-pay | Admitting: *Deleted

## 2018-08-22 ENCOUNTER — Encounter: Payer: Self-pay | Admitting: Family Medicine

## 2018-08-22 VITALS — Temp 98.3°F

## 2018-08-22 DIAGNOSIS — R05 Cough: Secondary | ICD-10-CM | POA: Diagnosis not present

## 2018-08-22 DIAGNOSIS — J301 Allergic rhinitis due to pollen: Secondary | ICD-10-CM | POA: Diagnosis not present

## 2018-08-22 DIAGNOSIS — J069 Acute upper respiratory infection, unspecified: Secondary | ICD-10-CM

## 2018-08-22 DIAGNOSIS — R059 Cough, unspecified: Secondary | ICD-10-CM

## 2018-08-22 MED ORDER — BENZONATATE 100 MG PO CAPS: 200.0000 mg | ORAL_CAPSULE | Freq: Two times a day (BID) | ORAL | 0 refills | Status: AC | PRN

## 2018-08-22 NOTE — Progress Notes (Signed)
Virtual Visit via Video Note   I connected with Ms Brianna Foster on 08/22/18 at 11:15 AM EDT by a video enabled telemedicine application and verified that I am speaking with the correct person using two identifiers.  Location patient: home Location provider:work Persons participating in the virtual visit: patient, provider  I discussed the limitations of evaluation and management by telemedicine and the availability of in person appointments. The patient expressed understanding and agreed to proceed.   HPI: She is a 63 yo female  complaining about fever, body aches, and fatigue that is started within 24 hours ago. Yesterday temperature was 100.77F, she took Aleve. Early this morning temperature 99.0 F had a couple hours ago 98.3 F  She denies sick exposure or recent travel. She had not been in contact with somebody known to have COVID-19. She would like to be tested for COVID-19.  " Little" sore throat.Denies dysphagia,stridor, or earache.  She is having "little cough", nonproductive, exacerbated by lying down. Sometimes "little shortness of breath" for about a week, exacerbated by exertion. She has not noted chest pain or wheezing.  She denies having anosmia or decreased in taste.   Nasal congestion, rhinorrhea, postnasal drainage.She thinks it may be her "sinuses." She has history of allergy rhinitis, she uses Flonase nasal spray.  2 days ago she had mild frontotemporal headache, resolved.  She denies nausea, vomiting, abdominal pain, changes in bowel habits, or urinary symptoms.   ROS: See pertinent positives and negatives per HPI.  Past Medical History:  Diagnosis Date  . GERD (gastroesophageal reflux disease)   . Headache(784.0)   . Hypertension   . OSA (obstructive sleep apnea) 08/28/2017    Past Surgical History:  Procedure Laterality Date  . LEFT HEART CATH AND CORONARY ANGIOGRAPHY N/A 07/11/2017   Procedure: LEFT HEART CATH AND CORONARY ANGIOGRAPHY;  Surgeon: Martinique,  Peter M, MD;  Location: [place] [facility];  Service: Cardiovascular;  Laterality: N/A;  . LEFT HEART CATHETERIZATION WITH CORONARY ANGIOGRAM N/A 05/11/2013   Procedure: LEFT HEART CATHETERIZATION WITH CORONARY ANGIOGRAM;  Surgeon: Burnell Blanks, MD;  Location: [facility] CATH LAB;  Service: Cardiovascular;  Laterality: N/A;  . neck fusion      Family History  Problem Relation Age of Onset  . Lung cancer Mother   . Stomach cancer Father   . Heart disease Brother        Pacemaker  . Esophageal cancer Brother   . Breast cancer Sister     Social History   Socioeconomic History  . Marital status: Divorced    Spouse name: Not on file  . Number of children: 1  . Years of education: College  . Highest education level: Not on file  Occupational History    Comment: [employer]  Social Needs  . Financial resource strain: Not on file  . Food insecurity:    Worry: Not on file    Inability: Not on file  . Transportation needs:    Medical: Not on file    Non-medical: Not on file  Tobacco Use  . Smoking status: Former Smoker    Packs/day: 0.25    Years: 16.00    Pack years: 4.00    Types: Cigarettes  . Smokeless tobacco: Never Used  Substance and Sexual Activity  . Alcohol use: No    Comment: Occassional Use  . Drug use: No  . Sexual activity: Not Currently    Birth control/protection: Post-menopausal  Lifestyle  . Physical activity:    Days per  week: Not on file    Minutes per session: Not on file  . Stress: Not on file  Relationships  . Social connections:    Talks on phone: Not on file    Gets together: Not on file    Attends religious service: Not on file    Active member of club or organization: Not on file    Attends meetings of clubs or organizations: Not on file    Relationship status: Not on file  . Intimate partner violence:    Fear of current or ex partner: Not on file    Emotionally abused: Not on file    Physically abused: Not on file    Forced  sexual activity: Not on file  Other Topics Concern  . Not on file  Social History Narrative   Lives at home alone   Right-handed   Drinks decaf coffee      Current Outpatient Medications:  .  albuterol (PROVENTIL HFA;VENTOLIN HFA) 108 (90 Base) MCG/ACT inhaler, Inhale 2 puffs into the lungs every 6 (six) hours as needed for wheezing or shortness of breath., Disp: 1 Inhaler, Rfl: 0 .  amLODipine (NORVASC) 5 MG tablet, Take 1 tablet (5 mg total) by mouth daily., Disp: 90 tablet, Rfl: 2 .  atorvastatin (LIPITOR) 20 MG tablet, Take 1 tablet (20 mg total) by mouth daily., Disp: 90 tablet, Rfl: 1 .  benzonatate (TESSALON) 100 MG capsule, Take 2 capsules (200 mg total) by mouth 2 (two) times daily as needed for up to 10 days., Disp: 40 capsule, Rfl: 0 .  famotidine (PEPCID) 20 MG tablet, Take 1 tablet (20 mg total) by mouth 2 (two) times daily., Disp: 30 tablet, Rfl: 0 .  fluticasone (FLONASE) 50 MCG/ACT nasal spray, Place 1 spray into both nostrils 2 (two) times daily as needed for allergies or rhinitis., Disp: 16 g, Rfl: 5 .  meclizine (ANTIVERT) 25 MG tablet, Take 1 tablet (25 mg total) by mouth 3 (three) times daily as needed for dizziness., Disp: 30 tablet, Rfl: 2 .  metoprolol tartrate (LOPRESSOR) 25 MG tablet, Take 0.5 tablets (12.5 mg total) by mouth 2 (two) times daily., Disp: 60 tablet, Rfl: 3 .  pantoprazole (PROTONIX) 40 MG tablet, Take 1 tablet (40 mg total) by mouth daily., Disp: 90 tablet, Rfl: 2 .  pantoprazole (PROTONIX) 40 MG tablet, TAKE 1 TABLET (40 MG TOTAL) BY MOUTH DAILY., Disp: 30 tablet, Rfl: 2 .  triamcinolone cream (KENALOG) 0.1 %, Apply 1 application topically 2 (two) times daily as needed. (Patient taking differently: Apply 1 application topically 2 (two) times daily as needed (to affected sites- for itching). ), Disp: 45 g, Rfl: 1 .  valACYclovir (VALTREX) 500 MG tablet, 1 TAB TWICE DAILY FOR 3 DAYS WITH OUTBREAKS, START WITHIN 48 HOURS AFTER ONSET., Disp: 18 tablet,  Rfl: 1  EXAM:  VITALS per patient if applicable:Temp 40.9 F (36.8 C)   LMP 01/07/2010 (LMP Unknown)   GENERAL: alert, oriented, appears well and in no acute distress  HEENT: atraumatic, conjunttiva clear, no obvious facial abnormalities on inspection.   NECK: normal movements of the head and neck  LUNGS: on inspection no signs of respiratory distress, breathing rate appears normal, no obvious gross SOB, gasping or wheezing  CV: no obvious cyanosis  MS: moves all visible extremities without noticeable abnormality  PSYCH/NEURO: pleasant and cooperative, no obvious depression,she is anxious.Speech and thought processing grossly intact  ASSESSMENT AND PLAN:  Discussed the following assessment and plan:  URI, acute  Cough - Plan: benzonatate (TESSALON) 100 MG capsule  Symptoms suggest viral etiology. Information about public site for COVID 19 testing given,phone number for her to call and arrange app. Quarantine recommended until test result is back. Symptomatic treatment recommended. Monitor for new symptoms. Acetaminophen 500 mg qid as needed recommend,check temp before taking med/ Adequate hydration and rest.  Excuse letter will be provided. Clearly instructed about warning signs.    Rhinitis, allergic Continue Flonase nasal spray. Nasal irrigations with saline as needed may also help. Explained that symptoms could be aggravated by URI symptoms.    I discussed the assessment and treatment plan with the patient. She was provided an opportunity to ask questions and all were answered. The patient agreed with the plan and demonstrated an understanding of the instructions.   The patient was advised to call back or seek an in-person evaluation if the symptoms worsen or if the condition fails to improve as anticipated.  Return if symptoms worsen or fail to improve.    Betty Martinique, MD

## 2018-08-22 NOTE — Assessment & Plan Note (Signed)
Continue Flonase nasal spray. Nasal irrigations with saline as needed may also help. Explained that symptoms could be aggravated by URI symptoms.

## 2018-08-22 NOTE — Telephone Encounter (Signed)
Scheduled a virtual appt with Dr Martinique

## 2018-08-22 NOTE — Telephone Encounter (Signed)
Pt called in c/o having a fever 100.3 yesterday afternoon late and body aches with chills.  No exposures to COVID-19 that she is aware of.  See triage notes.  I let the pt know someone would call her back after the office opens at 8:00 AM to schedule her for a virtual visit.   (Due to COVID-19 pandemic)  She was agreeable to this.   I verified her phone number and e mail.  I sent these notes to the [facility] office.     Reason for Disposition . Fever present > 3 days (72 hours)    Had fever last night 100.3 with body aches and chills.   Feeling very fatigued.  Answer Assessment - Initial Assessment Questions 1. TEMPERATURE: "What is the most recent temperature?"  "How was it measured?"    98.3 this morning.   Yesterday it was 100.3.     **2. ONSET: "When did the fever start?"      Yesterday 3. SYMPTOMS: "Do you have any other symptoms besides the fever?"  (e.g., colds, headache, sore throat, earache, cough, rash, diarrhea, vomiting, abdominal pain)     I'm having sinus problems.   I used my Flonase.   I started having body aches.   I was cold and got under the covers.    I started coughing.   I'm very fatigued.   I work at [employer] so people are in and out.    My nose runs a lot.   4. CAUSE: If there are no symptoms, ask: "What do you think is causing the fever?"      I don't know. 5. CONTACTS: "Does anyone else in the family have an infection?"     No 6. TREATMENT: "What have you done so far to treat this fever?" (e.g., medications)     I took Aleve last night. 7. IMMUNOCOMPROMISE: "Do you have of the following: diabetes, HIV positive, splenectomy, cancer chemotherapy, chronic steroid treatment, transplant patient, etc."     No 8. PREGNANCY: "Is there any chance you are pregnant?" "When was your last menstrual period?"     Not askedd 9. TRAVEL: "Have you traveled out of the country in the last month?" (e.g., travel history, exposures)     No travels  Protocols used:  FEVER-A-AH

## 2018-08-25 ENCOUNTER — Other Ambulatory Visit: Payer: Self-pay

## 2018-08-25 ENCOUNTER — Emergency Department (HOSPITAL_COMMUNITY): Payer: 59

## 2018-08-25 ENCOUNTER — Emergency Department (HOSPITAL_COMMUNITY)
Admission: EM | Admit: 2018-08-25 | Discharge: 2018-08-25 | Disposition: A | Discharge status: Home / Self Care | Payer: 59 | Attending: Emergency Medicine | Admitting: Emergency Medicine

## 2018-08-25 DIAGNOSIS — R509 Fever, unspecified: Secondary | ICD-10-CM

## 2018-08-25 DIAGNOSIS — Z20828 Contact with and (suspected) exposure to other viral communicable diseases: Secondary | ICD-10-CM | POA: Diagnosis not present

## 2018-08-25 DIAGNOSIS — M7918 Myalgia, other site: Secondary | ICD-10-CM | POA: Diagnosis not present

## 2018-08-25 DIAGNOSIS — Z87891 Personal history of nicotine dependence: Secondary | ICD-10-CM | POA: Diagnosis not present

## 2018-08-25 DIAGNOSIS — N3001 Acute cystitis with hematuria: Secondary | ICD-10-CM | POA: Insufficient documentation

## 2018-08-25 DIAGNOSIS — R110 Nausea: Secondary | ICD-10-CM | POA: Insufficient documentation

## 2018-08-25 DIAGNOSIS — I10 Essential (primary) hypertension: Secondary | ICD-10-CM | POA: Insufficient documentation

## 2018-08-25 DIAGNOSIS — Z79899 Other long term (current) drug therapy: Secondary | ICD-10-CM | POA: Diagnosis not present

## 2018-08-25 DIAGNOSIS — R05 Cough: Secondary | ICD-10-CM | POA: Diagnosis not present

## 2018-08-25 LAB — BASIC METABOLIC PANEL
Anion gap: 11 (ref 5–15)
BUN: 7 mg/dL — ABNORMAL LOW (ref 8–23)
CO2: 25 mmol/L (ref 22–32)
Calcium: 9 mg/dL (ref 8.9–10.3)
Chloride: 100 mmol/L (ref 98–111)
Creatinine, Ser: 0.96 mg/dL (ref 0.44–1.00)
GFR calc Af Amer: >60 mL/min (ref >60)
GFR calc non Af Amer: >60 mL/min (ref >60)
Glucose, Bld: 118 mg/dL — ABNORMAL HIGH (ref 70–99)
Potassium: 4.5 mmol/L (ref 3.5–5.1)
Sodium: 136 mmol/L (ref 135–145)

## 2018-08-25 LAB — SARS CORONAVIRUS 2 BY RT PCR (HOSPITAL ORDER, PERFORMED IN ~~LOC~~ HOSPITAL LAB): SARS Coronavirus 2: NEGATIVE

## 2018-08-25 LAB — URINALYSIS, ROUTINE W REFLEX MICROSCOPIC
Bilirubin Urine: NEGATIVE
Glucose, UA: NEGATIVE mg/dL
Ketones, ur: NEGATIVE mg/dL
Nitrite: NEGATIVE
Protein, ur: 30 mg/dL — AB
RBC / HPF: >50 RBC/hpf — ABNORMAL HIGH (ref 0–5)
Specific Gravity, Urine: 1.014 (ref 1.005–1.030)
pH: 8 (ref 5.0–8.0)

## 2018-08-25 LAB — CBC WITH DIFFERENTIAL/PLATELET
Abs Immature Granulocytes: 0.06 10*3/uL (ref 0.00–0.07)
Basophils Absolute: 0 10*3/uL (ref 0.0–0.1)
Basophils Relative: 0 %
Eosinophils Absolute: 0 10*3/uL (ref 0.0–0.5)
Eosinophils Relative: 0 %
HCT: 38.1 % (ref 36.0–46.0)
Hemoglobin: 12.7 g/dL (ref 12.0–15.0)
Immature Granulocytes: 0 %
Lymphocytes Relative: 11 %
Lymphs Abs: 1.6 10*3/uL (ref 0.7–4.0)
MCH: 30.4 pg (ref 26.0–34.0)
MCHC: 33.3 g/dL (ref 30.0–36.0)
MCV: 91.1 fL (ref 80.0–100.0)
Monocytes Absolute: 2.3 10*3/uL — ABNORMAL HIGH (ref 0.1–1.0)
Monocytes Relative: 16 %
Neutro Abs: 10.6 10*3/uL — ABNORMAL HIGH (ref 1.7–7.7)
Neutrophils Relative %: 73 %
Platelets: 301 10*3/uL (ref 150–400)
RBC: 4.18 MIL/uL (ref 3.87–5.11)
RDW: 11.9 % (ref 11.5–15.5)
WBC: 14.5 10*3/uL — ABNORMAL HIGH (ref 4.0–10.5)
nRBC: 0 % (ref 0.0–0.2)

## 2018-08-25 MED ORDER — SODIUM CHLORIDE 0.9 % IV SOLN
1.0000 g | Freq: Once | INTRAVENOUS | Status: AC
  Administered 2018-08-25: 12:00:00 1 g via INTRAVENOUS
  Filled 2018-08-25: qty 10

## 2018-08-25 MED ORDER — ACETAMINOPHEN 325 MG PO TABS
650.0000 mg | ORAL_TABLET | Freq: Once | ORAL | Status: AC
  Administered 2018-08-25: 650 mg via ORAL
  Filled 2018-08-25: qty 2

## 2018-08-25 MED ORDER — CEPHALEXIN 500 MG PO CAPS: 500.0000 mg | ORAL_CAPSULE | Freq: Three times a day (TID) | ORAL | 0 refills | Status: DC

## 2018-08-25 NOTE — ED Notes (Signed)
EDP at bedside  

## 2018-08-25 NOTE — ED Triage Notes (Signed)
3 day hx of fever, body aches, minimal cough, midsternal CP and nausea.

## 2018-08-27 ENCOUNTER — Ambulatory Visit: Payer: Self-pay

## 2018-08-27 ENCOUNTER — Ambulatory Visit (INDEPENDENT_AMBULATORY_CARE_PROVIDER_SITE_OTHER): Payer: 59 | Admitting: Family Medicine

## 2018-08-27 ENCOUNTER — Encounter: Payer: Self-pay | Admitting: Family Medicine

## 2018-08-27 VITALS — Temp 103.6°F

## 2018-08-27 DIAGNOSIS — R1032 Left lower quadrant pain: Secondary | ICD-10-CM | POA: Diagnosis not present

## 2018-08-27 DIAGNOSIS — N390 Urinary tract infection, site not specified: Secondary | ICD-10-CM

## 2018-08-27 DIAGNOSIS — R509 Fever, unspecified: Secondary | ICD-10-CM

## 2018-08-27 LAB — URINE CULTURE: Culture: 60000 — AB

## 2018-08-27 NOTE — Progress Notes (Signed)
Virtual Visit via Video Note   I connected with Brianna Foster on 08/27/18 at  3:15 PM EDT by a video enabled telemedicine application and verified that I am speaking with the correct person using two identifiers.  Location patient: home Location provider:home office Persons participating in the virtual visit: patient, provider  I discussed the limitations of evaluation and management by telemedicine and the availability of in person appointments. The patient expressed understanding and agreed to proceed.   HPI: Brianna Foster is a 63 yo female c/o recurrent fever. I saw her on 08/22/18 when she was c/o a day of cough,body aches,and fever that started on 08/21/18. Explained that it was most likely viral,symptomatic treatment recommended,and information about how she can be tested for COVID 19 given.  She presented to the ER on 08/25/2018 because of fever of 104 F, she was diagnosed with a UTI and discharged home on cephalexin 500 mg 3 times daily.  Today she had fever again, 103.9, F oral temperature. Mild nasal congestion and rhinorrhea, attributed to allergies.  She is concerned because she is still having fever, she does not feel like Tylenol is helping. She took Tylenol about 20 minutes ago and still feels chills. Ucx grew E. coli 60,000 UFC. Cultures x2 no growth so far.  Lab Results  Component Value Date   WBC 14.5 (H) 08/25/2018   HGB 12.7 08/25/2018   HCT 38.1 08/25/2018   MCV 91.1 08/25/2018   PLT 301 08/25/2018   Lab Results  Component Value Date   CREATININE 0.96 08/25/2018   BUN 7 (L) 08/25/2018   NA 136 08/25/2018   K 4.5 08/25/2018   CL 100 08/25/2018   CO2 25 08/25/2018   CXR: Negative for acute cardiopulmonary process. COVID 19 negative.  She states that in general she does not feel bad, mild chills. She had night sweats, resolved.  She denies headache, sore throat, chest pain, dyspnea, wheezing, nausea, vomiting, or urinary symptoms.  Yesterday she had LLQ  abdominal pain, cramps, and an episode of diarrhea. Today she is feeling better.  ROS: See pertinent positives and negatives per HPI.  Past Medical History:  Diagnosis Date  . GERD (gastroesophageal reflux disease)   . Headache(784.0)   . Hypertension   . OSA (obstructive sleep apnea) 08/28/2017    Past Surgical History:  Procedure Laterality Date  . LEFT HEART CATH AND CORONARY ANGIOGRAPHY N/A 07/11/2017   Procedure: LEFT HEART CATH AND CORONARY ANGIOGRAPHY;  Surgeon: Martinique, Peter M, MD;  Location: [place] [facility];  Service: Cardiovascular;  Laterality: N/A;  . LEFT HEART CATHETERIZATION WITH CORONARY ANGIOGRAM N/A 05/11/2013   Procedure: LEFT HEART CATHETERIZATION WITH CORONARY ANGIOGRAM;  Surgeon: Burnell Blanks, MD;  Location: [facility] CATH LAB;  Service: Cardiovascular;  Laterality: N/A;  . neck fusion      Family History  Problem Relation Age of Onset  . Lung cancer Mother   . Stomach cancer Father   . Heart disease Brother        Pacemaker  . Esophageal cancer Brother   . Breast cancer Sister     Social History   Socioeconomic History  . Marital status: Divorced    Spouse name: Not on file  . Number of children: 1  . Years of education: College  . Highest education level: Not on file  Occupational History    Comment: [employer]  Social Needs  . Financial resource strain: Not on file  . Food insecurity:    Worry:  Not on file    Inability: Not on file  . Transportation needs:    Medical: Not on file    Non-medical: Not on file  Tobacco Use  . Smoking status: Former Smoker    Packs/day: 0.25    Years: 16.00    Pack years: 4.00    Types: Cigarettes  . Smokeless tobacco: Never Used  Substance and Sexual Activity  . Alcohol use: No    Comment: Occassional Use  . Drug use: No  . Sexual activity: Not Currently    Birth control/protection: Post-menopausal  Lifestyle  . Physical activity:    Days per week: Not on file    Minutes per session: Not  on file  . Stress: Not on file  Relationships  . Social connections:    Talks on phone: Not on file    Gets together: Not on file    Attends religious service: Not on file    Active member of club or organization: Not on file    Attends meetings of clubs or organizations: Not on file    Relationship status: Not on file  . Intimate partner violence:    Fear of current or ex partner: Not on file    Emotionally abused: Not on file    Physically abused: Not on file    Forced sexual activity: Not on file  Other Topics Concern  . Not on file  Social History Narrative   Lives at home alone   Right-handed   Drinks decaf coffee     Current Outpatient Medications:  .  acetaminophen (TYLENOL) 500 MG tablet, Take 500 mg by mouth every 6 (six) hours as needed for mild pain., Disp: , Rfl:  .  albuterol (PROVENTIL HFA;VENTOLIN HFA) 108 (90 Base) MCG/ACT inhaler, Inhale 2 puffs into the lungs every 6 (six) hours as needed for wheezing or shortness of breath. (Patient not taking: Reported on 08/25/2018), Disp: 1 Inhaler, Rfl: 0 .  amLODipine (NORVASC) 5 MG tablet, Take 1 tablet (5 mg total) by mouth daily., Disp: 90 tablet, Rfl: 2 .  atorvastatin (LIPITOR) 20 MG tablet, Take 1 tablet (20 mg total) by mouth daily. (Patient not taking: Reported on 08/25/2018), Disp: 90 tablet, Rfl: 1 .  benzonatate (TESSALON) 100 MG capsule, Take 2 capsules (200 mg total) by mouth 2 (two) times daily as needed for up to 10 days. (Patient not taking: Reported on 08/25/2018), Disp: 40 capsule, Rfl: 0 .  cephALEXin (KEFLEX) 500 MG capsule, Take 1 capsule (500 mg total) by mouth 3 (three) times daily., Disp: 15 capsule, Rfl: 0 .  famotidine (PEPCID) 20 MG tablet, Take 1 tablet (20 mg total) by mouth 2 (two) times daily. (Patient not taking: Reported on 08/25/2018), Disp: 30 tablet, Rfl: 0 .  fluticasone (FLONASE) 50 MCG/ACT nasal spray, Place 1 spray into both nostrils 2 (two) times daily as needed for allergies or rhinitis.,  Disp: 16 g, Rfl: 5 .  meclizine (ANTIVERT) 25 MG tablet, Take 1 tablet (25 mg total) by mouth 3 (three) times daily as needed for dizziness., Disp: 30 tablet, Rfl: 2 .  metoprolol tartrate (LOPRESSOR) 25 MG tablet, Take 0.5 tablets (12.5 mg total) by mouth 2 (two) times daily., Disp: 60 tablet, Rfl: 3 .  pantoprazole (PROTONIX) 40 MG tablet, Take 1 tablet (40 mg total) by mouth daily., Disp: 90 tablet, Rfl: 2 .  pantoprazole (PROTONIX) 40 MG tablet, TAKE 1 TABLET (40 MG TOTAL) BY MOUTH DAILY. (Patient not taking: Reported on 08/25/2018), Disp:  30 tablet, Rfl: 2 .  triamcinolone cream (KENALOG) 0.1 %, Apply 1 application topically 2 (two) times daily as needed. (Patient taking differently: Apply 1 application topically 2 (two) times daily as needed (to affected sites- for itching). ), Disp: 45 g, Rfl: 1 .  valACYclovir (VALTREX) 500 MG tablet, 1 TAB TWICE DAILY FOR 3 DAYS WITH OUTBREAKS, START WITHIN 48 HOURS AFTER ONSET. (Patient taking differently: Take 500 mg by mouth 2 (two) times daily. FOR 3 DAYS WITH OUTBREAKS, START WITHIN 48 HOURS AFTER ONSET.), Disp: 18 tablet, Rfl: 1  EXAM:  VITALS per patient if applicable:Temp (!) 295.2 F (39.8 C)   LMP 01/07/2010 (LMP Unknown)   GENERAL: alert, oriented, appears well and in no acute distress  HEENT: atraumatic, conjunctiva clear, no obvious abnormalities on inspection of external nose and ears  NECK: normal movements of the head and neck  LUNGS: on inspection no signs of respiratory distress, breathing rate appears normal, no obvious gross SOB, gasping or wheezing  CV: no obvious cyanosis  Brianna: moves all visible extremities without noticeable abnormality  PSYCH/NEURO: pleasant and cooperative, no obvious depression, she is anxous. Speech and thought processing grossly intact  ASSESSMENT AND PLAN:  Discussed the following assessment and plan:  Fever, unspecified fever cause I am not convinced fever is caused by UTI, I still think it is due  to a viral illness. Recommend continue checking temperature.She is not sure if thermometer is accurate,so will get a new one.  She can alternate with acetaminophen and ibuprofen, we discussed some side effects of ibuprofen.  In 4 days she will let me know if she is still having fever, we may need to repeat blood work.  She was clearly instructed about warning signs.  Urinary tract infection without hematuria, site unspecified She is not having urinary symptoms. Recommend completing antibiotic treatment. Adequate hydration. UA > 50 RBC; we will plan on repeating UA next f/u visit.  Follow-up as needed.  LLQ abdominal pain Today she is not symptomatic. Possible etiology discussed. I do not think imaging is needed at this time. Colonoscopy in 04/2015 no diverticulosis mentioned, 7 polyps found. Instructed about warning signs.    25 min face to face OV. > 50% was dedicated to discussion of differential Dx, prognosis, treatment options, and some side effects of medications. Very anxious, she needed a lot of reassurance.  I discussed the assessment and treatment plan with the patient. She was provided an opportunity to ask questions and all were answered. The patient agreed with the plan and demonstrated an understanding of the instructions.   The patient was advised to call back or seek an in-person evaluation if the symptoms worsen or if the condition fails to improve as anticipated.  Return if symptoms worsen or fail to improve.    Betty Martinique, MD

## 2018-08-27 NOTE — Telephone Encounter (Signed)
Patient scheduled virtual visit at 3:15 pm today with Dr. Martinique.

## 2018-08-27 NOTE — Telephone Encounter (Signed)
Pt called stating that she has been sick wit fever since last Thursday.  She states she had a virtual visit with Dr Martinique who had her set for COVID-19 testing Monday.  She states she was so sick on Monday that she called 911.  She was tested for COVID-19 in the ER and states her test was negative.  She was Dx with a UTI and give antibiotic.  She states that she was feeling better but today her fever has returned 103.9. She has chills. Last night she had left lower quadrant abdominal pain and then diarrhea x 2 stools.  She states that the pain has gone today. Care advice read to patient. Patient verbalized understanding of instructions.  Call transferred to office for scheduling.  Reason for Disposition . [1] Fever > 101 F (38.3 C) AND [2] age > 14  Answer Assessment - Initial Assessment Questions 1. TEMPERATURE: "What is the most recent temperature?"  "How was it measured?"      103.9 2. ONSET: "When did the fever start?"      Last Thursday 3. SYMPTOMS: "Do you have any other symptoms besides the fever?"  (e.g., colds, headache, sore throat, earache, cough, rash, diarrhea, vomiting, abdominal pain)     Abdominal pain 4. CAUSE: If there are no symptoms, ask: "What do you think is causing the fever?"      Urinary infection Dx at hospital 5. CONTACTS: "Does anyone else in the family have an infection?"     none 6. TREATMENT: "What have you done so far to treat this fever?" (e.g., medications)     Tylenol, Cephalexin 7. IMMUNOCOMPROMISE: "Do you have of the following: diabetes, HIV positive, splenectomy, cancer chemotherapy, chronic steroid treatment, transplant patient, etc."    no 8. PREGNANCY: "Is there any chance you are pregnant?" "When was your last menstrual period?"     N/A 9. TRAVEL: "Have you traveled out of the country in the last month?" (e.g., travel history, exposures)     No  Protocols used: FEVER-A-AH

## 2018-08-28 ENCOUNTER — Telehealth: Payer: Self-pay | Admitting: *Deleted

## 2018-08-28 NOTE — ED Provider Notes (Signed)
[facility] EMERGENCY DEPARTMENT Provider Note   CSN: [id number] Arrival date & time: 08/25/18  0932    History   Chief Complaint No chief complaint on file.   HPI Brianna Foster is a 63 y.o. female.     HPI   62yF with fever and body aches. Onset about 3d ago. Persistent since then. Mild occasional cough. Nausea. No v/d. No urinary complaints. No rash. No sick contacts that she is aware of.   Past Medical History:  Diagnosis Date  . GERD (gastroesophageal reflux disease)   . Headache(784.0)   . Hypertension   . OSA (obstructive sleep apnea) 08/28/2017    Patient Active Problem List   Diagnosis Date Noted  . Rhinitis, allergic 06/23/2018  . OSA (obstructive sleep apnea) 08/28/2017  . Recurrent genital herpes 07/19/2017  . Unstable angina ([place]) 07/11/2017  . De Quervain's tenosynovitis, right 05/08/2016  . Cervical radiculopathy 05/08/2016  . Benign paroxysmal positional vertigo 03/20/2016  . Eczema 03/20/2016  . Hyperlipidemia, unspecified 03/20/2016  . Vertigo 10/28/2015  . Headache 10/28/2015  . Tobacco abuse 07/23/2012  . Gastroesophageal reflux disease 06/18/2011  . Essential hypertension 06/18/2011    Past Surgical History:  Procedure Laterality Date  . LEFT HEART CATH AND CORONARY ANGIOGRAPHY N/A 07/11/2017   Procedure: LEFT HEART CATH AND CORONARY ANGIOGRAPHY;  Surgeon: Martinique, Peter M, MD;  Location: [facility];  Service: Cardiovascular;  Laterality: N/A;  . LEFT HEART CATHETERIZATION WITH CORONARY ANGIOGRAM N/A 05/11/2013   Procedure: LEFT HEART CATHETERIZATION WITH CORONARY ANGIOGRAM;  Surgeon: Burnell Blanks, MD;  Location: [facility] CATH [facility];  Service: Cardiovascular;  Laterality: N/A;  . neck fusion       OB History   No obstetric history on file.      Home Medications    Prior to Admission medications   Medication Sig Start Date End Date Taking? Authorizing Provider  acetaminophen (TYLENOL) 500 MG tablet Take  500 mg by mouth every 6 (six) hours as needed for mild pain.   Yes [provider]  amLODipine (NORVASC) 5 MG tablet Take 1 tablet (5 mg total) by mouth daily. 06/23/18  Yes Martinique, Betty G, MD  fluticasone [facility]) 50 MCG/ACT nasal spray Place 1 spray into both nostrils 2 (two) times daily as needed for allergies or rhinitis. 06/23/18  Yes Martinique, Betty G, MD  meclizine (ANTIVERT) 25 MG tablet Take 1 tablet (25 mg total) by mouth 3 (three) times daily as needed for dizziness. 06/23/18  Yes Martinique, Betty G, MD  metoprolol tartrate (LOPRESSOR) 25 MG tablet Take 0.5 tablets (12.5 mg total) by mouth 2 (two) times daily. 03/17/18  Yes Lelon Perla, MD  pantoprazole (PROTONIX) 40 MG tablet Take 1 tablet (40 mg total) by mouth daily. 12/11/16  Yes Martinique, Betty G, MD  triamcinolone cream (KENALOG) 0.1 % Apply 1 application topically 2 (two) times daily as needed. Patient taking differently: Apply 1 application topically 2 (two) times daily as needed (to affected sites- for itching).  03/20/16  Yes Martinique, Betty G, MD  valACYclovir (VALTREX) 500 MG tablet 1 TAB TWICE DAILY FOR 3 DAYS WITH OUTBREAKS, START WITHIN 48 HOURS AFTER ONSET. Patient taking differently: Take 500 mg by mouth 2 (two) times daily. FOR 3 DAYS WITH OUTBREAKS, START WITHIN 48 HOURS AFTER ONSET. 08/13/18  Yes Martinique, Betty G, MD  albuterol (PROVENTIL HFA;VENTOLIN HFA) 108 (90 Base) MCG/ACT inhaler Inhale 2 puffs into the lungs every 6 (six) hours as needed for wheezing  or shortness of breath. Patient not taking: Reported on 08/25/2018 05/08/17   Martinique, Betty G, MD  atorvastatin (LIPITOR) 20 MG tablet Take 1 tablet (20 mg total) by mouth daily. Patient not taking: Reported on 08/25/2018 05/15/17   Martinique, Betty G, MD  benzonatate (TESSALON) 100 MG capsule Take 2 capsules (200 mg total) by mouth 2 (two) times daily as needed for up to 10 days. Patient not taking: Reported on 08/25/2018 08/22/18 09/01/18  Martinique, Betty G, MD  cephALEXin  (KEFLEX) 500 MG capsule Take 1 capsule (500 mg total) by mouth 3 (three) times daily. 08/25/18   Virgel Manifold, MD  famotidine (PEPCID) 20 MG tablet Take 1 tablet (20 mg total) by mouth 2 (two) times daily. Patient not taking: Reported on 08/25/2018 05/02/16   Fredia Sorrow, MD  pantoprazole (PROTONIX) 40 MG tablet TAKE 1 TABLET (40 MG TOTAL) BY MOUTH DAILY. Patient not taking: Reported on 08/25/2018 07/07/18   Martinique, Betty G, MD    Family History Family History  Problem Relation Age of Onset  . Lung cancer Mother   . Stomach cancer Father   . Heart disease Brother        Pacemaker  . Esophageal cancer Brother   . Breast cancer Sister     Social History Social History   Tobacco Use  . Smoking status: Former Smoker    Packs/day: 0.25    Years: 16.00    Pack years: 4.00    Types: Cigarettes  . Smokeless tobacco: Never Used  Substance Use Topics  . Alcohol use: No    Comment: Occassional Use  . Drug use: No     Allergies   Other   Review of Systems Review of Systems  All systems reviewed and negative, other than as noted in HPI.  Physical Exam Updated Vital Signs BP (!) 157/75   Pulse 76   Temp (!) 101.3 F (38.5 C) (Oral)   Resp (!) 23   Ht 5\' 8"  (1.727 m)   Wt 107.5 kg   LMP 01/07/2010 (LMP Unknown)   SpO2 97%   BMI 36.04 kg/m   Physical Exam Vitals signs and nursing note reviewed.  Constitutional:      General: She is not in acute distress.    Appearance: She is well-developed.  HENT:     Head: Normocephalic and atraumatic.  Eyes:     General:        Right eye: No discharge.        Left eye: No discharge.     Conjunctiva/sclera: Conjunctivae normal.  Neck:     Musculoskeletal: Neck supple.  Cardiovascular:     Rate and Rhythm: Normal rate and regular rhythm.     Heart sounds: Normal heart sounds. No murmur. No friction rub. No gallop.   Pulmonary:     Effort: Pulmonary effort is normal. No respiratory distress.     Breath sounds: Normal  breath sounds.  Abdominal:     General: There is no distension.     Palpations: Abdomen is soft.     Tenderness: There is no abdominal tenderness.  Musculoskeletal:        General: No tenderness.  Skin:    General: Skin is warm and dry.  Neurological:     Mental Status: She is alert.  Psychiatric:        Behavior: Behavior normal.        Thought Content: Thought content normal.      ED Treatments / Results  Labs (all labs ordered are listed, but only abnormal results are displayed) Labs Reviewed  URINE CULTURE - Abnormal; Notable for the following components:      Result Value   Culture 60,000 COLONIES/mL ESCHERICHIA COLI (*)    Organism ID, Bacteria ESCHERICHIA COLI (*)    All other components within normal limits  CBC WITH DIFFERENTIAL/PLATELET - Abnormal; Notable for the following components:   WBC 14.5 (*)    Neutro Abs 10.6 (*)    Monocytes Absolute 2.3 (*)    All other components within normal limits  BASIC METABOLIC PANEL - Abnormal; Notable for the following components:   Glucose, Bld 118 (*)    BUN 7 (*)    All other components within normal limits  URINALYSIS, ROUTINE W REFLEX MICROSCOPIC - Abnormal; Notable for the following components:   Hgb urine dipstick MODERATE (*)    Protein, ur 30 (*)    Leukocytes,Ua TRACE (*)    RBC / HPF >50 (*)    Bacteria, UA RARE (*)    All other components within normal limits  CULTURE, BLOOD (ROUTINE X 2)  CULTURE, BLOOD (ROUTINE X 2)  SARS CORONAVIRUS 2 (HOSPITAL ORDER, [facility])    EKG EKG Interpretation  Date/Time:  Monday Aug 25 2018 09:39:16 EDT Ventricular Rate:  85 PR Interval:    QRS Duration: 94 QT Interval:  353 QTC Calculation: 420 R Axis:   25 Text Interpretation:  Sinus rhythm Biatrial enlargement No significant change since last tracing Confirmed by Virgel Manifold [phone]) on 08/25/2018 9:43:02 AM   Radiology No results found.  Procedures Procedures (including critical  care time)  Medications Ordered in ED Medications  acetaminophen (TYLENOL) tablet 650 mg (650 mg Oral Given 08/25/18 1022)  cefTRIAXone (ROCEPHIN) 1 g in sodium chloride 0.9 % 100 mL IVPB (0 g Intravenous Stopped 08/25/18 1256)     Initial Impression / Assessment and Plan / ED Course  I have reviewed the triage vital signs and the nursing notes.  Pertinent labs & imaging results that were available during my care of the patient were reviewed by me and considered in my medical decision making (see chart for details).       62yF with fever and body aches. Looks like she has a UTI although no specific urinary complaints. I do not have a clear source of her symptoms otherwise. Will send culture and place on abx.   Brianna Foster was evaluated in Emergency Department on 08/28/2018 for the symptoms described in the history of present illness. She was evaluated in the context of the global COVID-19 pandemic, which necessitated consideration that the patient might be at risk for infection with the SARS-CoV-2 virus that causes COVID-19. Institutional protocols and algorithms that pertain to the evaluation of patients at risk for COVID-19 are in a state of rapid change based on information released by regulatory bodies including the CDC and federal and state organizations. These policies and algorithms were followed during the patient's care in the ED.   Final Clinical Impressions(s) / ED Diagnoses   Final diagnoses:  Acute cystitis with hematuria  Febrile illness    ED Discharge Orders         Ordered    cephALEXin (KEFLEX) 500 MG capsule  3 times daily     08/25/18 1238           Virgel Manifold, MD 08/28/18 1247

## 2018-08-28 NOTE — Telephone Encounter (Signed)
Post ED Visit - Positive Culture Follow-up  Culture report reviewed by antimicrobial stewardship pharmacist: [facility] Team []  Elenor Quinones, Pharm.D. []  Heide Guile, Pharm.D., BCPS AQ-ID []  Parks Neptune, Pharm.D., BCPS []  Alycia Rossetti, Pharm.D., BCPS []  [place], Pharm.D., BCPS, AAHIVP []  Legrand Como, Pharm.D., BCPS, AAHIVP [x]  Salome Arnt, PharmD, BCPS []  Johnnette Gourd, PharmD, BCPS []  Hughes Better, PharmD, BCPS []  Leeroy Cha, PharmD []  Laqueta Linden, PharmD, BCPS []  Albertina Parr, PharmD  Helper Team []  Leodis Sias, PharmD []  Lindell Spar, PharmD []  Royetta Asal, PharmD []  Graylin Shiver, Rph []  Rema Fendt) Glennon Mac, PharmD []  Arlyn Dunning, PharmD []  Netta Cedars, PharmD []  Dia Sitter, PharmD []  Leone Haven, PharmD []  Gretta Arab, PharmD []  Theodis Shove, PharmD []  Peggyann Juba, PharmD []  Reuel Boom, PharmD   Positive urine culture Treated with Cephalexin, organism sensitive to the same and no further patient follow-up is required at this time.  Harlon Flor [facility] 08/28/2018, 10:03 AM

## 2018-08-30 LAB — CULTURE, BLOOD (ROUTINE X 2)
Culture: NO GROWTH
Culture: NO GROWTH

## 2018-09-09 MED FILL — Ceftriaxone Sodium For Inj 1 GM: INTRAMUSCULAR | Qty: 1 | Status: AC

## 2018-09-29 ENCOUNTER — Other Ambulatory Visit: Payer: Self-pay | Admitting: Family Medicine

## 2018-09-29 DIAGNOSIS — K2100 Gastro-esophageal reflux disease with esophagitis, without bleeding: Secondary | ICD-10-CM

## 2018-10-02 ENCOUNTER — Encounter: Payer: Self-pay | Admitting: Family Medicine

## 2018-10-02 ENCOUNTER — Ambulatory Visit (INDEPENDENT_AMBULATORY_CARE_PROVIDER_SITE_OTHER): Payer: 59 | Admitting: Family Medicine

## 2018-10-02 ENCOUNTER — Other Ambulatory Visit: Payer: Self-pay

## 2018-10-02 DIAGNOSIS — S91209A Unspecified open wound of unspecified toe(s) with damage to nail, initial encounter: Secondary | ICD-10-CM | POA: Diagnosis not present

## 2018-10-02 DIAGNOSIS — L03032 Cellulitis of left toe: Secondary | ICD-10-CM

## 2018-10-02 DIAGNOSIS — M25471 Effusion, right ankle: Secondary | ICD-10-CM

## 2018-10-02 DIAGNOSIS — I10 Essential (primary) hypertension: Secondary | ICD-10-CM | POA: Diagnosis not present

## 2018-10-02 DIAGNOSIS — M25472 Effusion, left ankle: Secondary | ICD-10-CM

## 2018-10-02 MED ORDER — BLOOD PRESSURE MONITOR AUTOMAT DEVI: 1.0000 | Freq: Every day | 0 refills | Status: DC

## 2018-10-02 MED ORDER — CEPHALEXIN 500 MG PO CAPS: 500.0000 mg | ORAL_CAPSULE | Freq: Two times a day (BID) | ORAL | 0 refills | Status: DC

## 2018-10-02 NOTE — Assessment & Plan Note (Signed)
Recommend monitoring BP regularly. Rx for BP monitor sent to her pharmacy. No changes in current management,side effects of meds discussed. Continue low salt diet.

## 2018-10-02 NOTE — Progress Notes (Signed)
Virtual Visit via Video Note   I connected with Brianna Foster on 10/02/18 at  8:30 AM EDT by a video enabled telemedicine application and verified that I am speaking with the correct person using two identifiers.  Location patient: home Location provider:home office Persons participating in the virtual visit: patient, provider  I discussed the limitations of evaluation and management by telemedicine and the availability of in person appointments. The patient expressed understanding and agreed to proceed.   HPI: Brianna Foster is a 63 yo female with Hx of HTN,HLD, and OSA who is concerned about bilateral peri ankle edema left>right. It has been more noticeable for the past week. Last week she moved to a new place,long hours standing up. Noted that edema is alleviated by elevation and worse at the end of the day. Denies pain on area or erythema. Negative for orthopnea or PND.  She has not tried OTC medications.  HTN,she is not checking BP's at home. She is on Amlodipine 5 mg daily and Metoprolol Tartrate 12.5 mg bid.  Denies severe/frequent headache, visual changes, chest pain, dyspnea, palpitation, claudication, focal weakness,gross hematuria,decreased urine output,or foam in urine. Lab Results  Component Value Date   CREATININE 0.96 08/25/2018   BUN 7 (L) 08/25/2018   NA 136 08/25/2018   K 4.5 08/25/2018   CL 100 08/25/2018   CO2 25 08/25/2018    Also left great toenail tenderness. Tip of toenail bend up last week, she thinks tennis shoes were pressing on front of toenail. Toenail tender and periungual erythema.  She has not noted fever,chills,body aches,unusual fatigue, or cyanosis. She has not tried OTC medications.   ROS: See pertinent positives and negatives per HPI.  Past Medical History:  Diagnosis Date  . GERD (gastroesophageal reflux disease)   . Headache(784.0)   . Hypertension   . OSA (obstructive sleep apnea) 08/28/2017    Past Surgical History:  Procedure  Laterality Date  . LEFT HEART CATH AND CORONARY ANGIOGRAPHY N/A 07/11/2017   Procedure: LEFT HEART CATH AND CORONARY ANGIOGRAPHY;  Surgeon: Martinique, Peter M, MD;  Location: [place] [facility];  Service: Cardiovascular;  Laterality: N/A;  . LEFT HEART CATHETERIZATION WITH CORONARY ANGIOGRAM N/A 05/11/2013   Procedure: LEFT HEART CATHETERIZATION WITH CORONARY ANGIOGRAM;  Surgeon: Burnell Blanks, MD;  Location: [facility] CATH LAB;  Service: Cardiovascular;  Laterality: N/A;  . neck fusion      Family History  Problem Relation Age of Onset  . Lung cancer Mother   . Stomach cancer Father   . Heart disease Brother        Pacemaker  . Esophageal cancer Brother   . Breast cancer Sister     Social History   Socioeconomic History  . Marital status: Divorced    Spouse name: Not on file  . Number of children: 1  . Years of education: College  . Highest education level: Not on file  Occupational History    Comment: [employer]  Social Needs  . Financial resource strain: Not on file  . Food insecurity    Worry: Not on file    Inability: Not on file  . Transportation needs    Medical: Not on file    Non-medical: Not on file  Tobacco Use  . Smoking status: Former Smoker    Packs/day: 0.25    Years: 16.00    Pack years: 4.00    Types: Cigarettes  . Smokeless tobacco: Never Used  Substance and Sexual Activity  . Alcohol use:  No    Comment: Occassional Use  . Drug use: No  . Sexual activity: Not Currently    Birth control/protection: Post-menopausal  Lifestyle  . Physical activity    Days per week: Not on file    Minutes per session: Not on file  . Stress: Not on file  Relationships  . Social Herbalist on phone: Not on file    Gets together: Not on file    Attends religious service: Not on file    Active member of club or organization: Not on file    Attends meetings of clubs or organizations: Not on file    Relationship status: Not on file  . Intimate partner  violence    Fear of current or ex partner: Not on file    Emotionally abused: Not on file    Physically abused: Not on file    Forced sexual activity: Not on file  Other Topics Concern  . Not on file  Social History Narrative   Lives at home alone   Right-handed   Drinks decaf coffee      Current Outpatient Medications:  .  acetaminophen (TYLENOL) 500 MG tablet, Take 500 mg by mouth every 6 (six) hours as needed for mild pain., Disp: , Rfl:  .  albuterol (PROVENTIL HFA;VENTOLIN HFA) 108 (90 Base) MCG/ACT inhaler, Inhale 2 puffs into the lungs every 6 (six) hours as needed for wheezing or shortness of breath. (Patient not taking: Reported on 08/25/2018), Disp: 1 Inhaler, Rfl: 0 .  amLODipine (NORVASC) 5 MG tablet, Take 1 tablet (5 mg total) by mouth daily., Disp: 90 tablet, Rfl: 2 .  atorvastatin (LIPITOR) 20 MG tablet, Take 1 tablet (20 mg total) by mouth daily. (Patient not taking: Reported on 08/25/2018), Disp: 90 tablet, Rfl: 1 .  cephALEXin (KEFLEX) 500 MG capsule, Take 1 capsule (500 mg total) by mouth 2 (two) times daily for 7 days., Disp: 14 capsule, Rfl: 0 .  famotidine (PEPCID) 20 MG tablet, Take 1 tablet (20 mg total) by mouth 2 (two) times daily. (Patient not taking: Reported on 08/25/2018), Disp: 30 tablet, Rfl: 0 .  fluticasone (FLONASE) 50 MCG/ACT nasal spray, Place 1 spray into both nostrils 2 (two) times daily as needed for allergies or rhinitis., Disp: 16 g, Rfl: 5 .  meclizine (ANTIVERT) 25 MG tablet, Take 1 tablet (25 mg total) by mouth 3 (three) times daily as needed for dizziness., Disp: 30 tablet, Rfl: 2 .  metoprolol tartrate (LOPRESSOR) 25 MG tablet, Take 0.5 tablets (12.5 mg total) by mouth 2 (two) times daily., Disp: 60 tablet, Rfl: 3 .  pantoprazole (PROTONIX) 40 MG tablet, Take 1 tablet (40 mg total) by mouth daily., Disp: 90 tablet, Rfl: 2 .  pantoprazole (PROTONIX) 40 MG tablet, TAKE 1 TABLET BY MOUTH EVERY DAY, Disp: 30 tablet, Rfl: 2 .  triamcinolone cream  (KENALOG) 0.1 %, Apply 1 application topically 2 (two) times daily as needed. (Patient taking differently: Apply 1 application topically 2 (two) times daily as needed (to affected sites- for itching). ), Disp: 45 g, Rfl: 1 .  valACYclovir (VALTREX) 500 MG tablet, 1 TAB TWICE DAILY FOR 3 DAYS WITH OUTBREAKS, START WITHIN 48 HOURS AFTER ONSET. (Patient taking differently: Take 500 mg by mouth 2 (two) times daily. FOR 3 DAYS WITH OUTBREAKS, START WITHIN 48 HOURS AFTER ONSET.), Disp: 18 tablet, Rfl: 1  EXAM:  VITALS per patient if applicable:N/A  GENERAL: alert, oriented, appears well and in no acute distress  HEENT: atraumatic,normocephalic, conjunctiva clear.  LUNGS: on inspection no signs of respiratory distress, breathing rate appears normal, no obvious gross SOB, gasping or wheezing  CV: no obvious cyanosis. Left medial malleolus edema,trace,not pitting noted when she applies pressure with finger for a few seconds. No erythema, some telangiectasis noted.  Brianna: moves all visible extremities without noticeable abnormality  SKIN: Left great toenail with periungual erythema,lost tip of toenail. Upper aspect of nail bed exposed and ecchymosis appreciated.  PSYCH/NEURO: pleasant and cooperative, no obvious depression,+ anxious. Speech and thought processing grossly intact  ASSESSMENT AND PLAN: Brianna Foster was seen today because LE edema and HTN.  Discussed the following assessment and plan:  Ankle edema, bilateral - Plan: We dicussed possible etiologies. History does not suggest a serious process. ? Vein disease. Lower extremity elevation a few times during the day. I do not think diuretic is needed at this time. Amlodipine could aggravate problem. Adequate skin care. Instructed about warning signs. F/U as needed.  Traumatic avulsion of nail plate of toe, initial encounter -  Continue monitoring. I do not think imaging is needed today,no Hx of trauma.  Wear wide shoe. Keep toenail  trimmed down.  Paronychia of great toe of left foot - Plan: cephALEXin (KEFLEX) 500 MG capsule. Recommend soaking foot in warm water with Epson salt. Some side effects of abx discussed. Instructed about warning signs. F/U as needed.   Essential hypertension Recommend monitoring BP regularly. Rx for BP monitor sent to her pharmacy. No changes in current management,side effects of meds discussed. Continue low salt diet.     I discussed the assessment and treatment plan with the patient. She was provided an opportunity to ask questions and all were answered. The patient agreed with the plan and demonstrated an understanding of the instructions.   The patient was advised to call back or seek an in-person evaluation if the symptoms worsen or if the condition fails to improve as anticipated.  No follow-ups on file.    Lili Harts Martinique, MD

## 2018-10-03 ENCOUNTER — Telehealth: Payer: Self-pay | Admitting: *Deleted

## 2018-10-03 ENCOUNTER — Other Ambulatory Visit: Payer: Self-pay | Admitting: *Deleted

## 2018-10-03 DIAGNOSIS — L03032 Cellulitis of left toe: Secondary | ICD-10-CM

## 2018-10-03 MED ORDER — CEPHALEXIN 500 MG PO CAPS: 500.0000 mg | ORAL_CAPSULE | Freq: Two times a day (BID) | ORAL | 0 refills | Status: AC

## 2018-10-03 NOTE — Telephone Encounter (Signed)
Rx corrected to normal delivery and sent to the pharmacy.

## 2018-10-03 NOTE — Telephone Encounter (Signed)
Copied from [facility] [id number]. Topic: Quick Communication - Rx Refill/Question >> Oct 02, 2018  4:48 PM Erick Blinks wrote: Medication: cephALEXin (KEFLEX) 500 MG capsule Pt states that pharmacy received blood pressure monitor but they did not receive antibiotic. Please advise.

## 2018-10-21 ENCOUNTER — Telehealth: Payer: Self-pay | Admitting: Pulmonary Disease

## 2018-10-21 DIAGNOSIS — G4719 Other hypersomnia: Secondary | ICD-10-CM

## 2018-10-21 NOTE — Telephone Encounter (Signed)
There is not an active order for a home sleep study for this patient.

## 2018-10-21 NOTE — Telephone Encounter (Signed)
Pt was informed my her insurance she needs new study as old study greater than 1 year. Order placed for HST. Nothing further needed.

## 2018-10-21 NOTE — Telephone Encounter (Signed)
Order was placed but denied. Pt needs office visit. Ov scheduled Nothing further needed.

## 2018-10-26 NOTE — Progress Notes (Deleted)
@Patient  ID: Brianna Foster, female    DOB: Dec 25, 1955, 63 y.o.   MRN: [id number]  No chief complaint on file.   Referring provider: Martinique, Betty G, MD  HPI:  63 year old female with severe obstructive sleep apnea on May/2019 home sleep study.  Patient never started CPAP therapy.  PMH:  Smoker/ Smoking History:  Maintenance:   Pt of: Dr. [name]  10/26/2018  - Visit   HPI  Tests:   HST 08/26/17 >> AHI 30.3, SaO2 low 71%.  Results of the Epworth flowsheet 05/24/2017  Sitting and reading 0  Watching TV 3  Sitting, inactive in a public place (e.g. a theatre or a meeting) 1  As a passenger in a car for an hour without a break 0  Lying down to rest in the afternoon when circumstances permit 3  Sitting and talking to someone 0  Sitting quietly after a lunch without alcohol 3  In a car, while stopped for a few minutes in traffic 0  Total score 10    FENO:  No results found for: NITRICOXIDE  PFT: No flowsheet data found.  Imaging: No results found.    Specialty Problems      Pulmonary Problems   OSA (obstructive sleep apnea)   Rhinitis, allergic      Allergies  Allergen Reactions  . Other Anaphylaxis, Shortness Of Breath and Swelling    [place] NUTS    Immunization History  Administered Date(s) Administered  . Tdap 12/11/2016    Past Medical History:  Diagnosis Date  . GERD (gastroesophageal reflux disease)   . Headache(784.0)   . Hypertension   . OSA (obstructive sleep apnea) 08/28/2017    Tobacco History: Social History   Tobacco Use  Smoking Status Former Smoker  . Packs/day: 0.25  . Years: 16.00  . Pack years: 4.00  . Types: Cigarettes  Smokeless Tobacco Never Used   Counseling given: Not Answered   Continue to not smoke  Outpatient Encounter Medications as of 10/28/2018  Medication Sig  . acetaminophen (TYLENOL) 500 MG tablet Take 500 mg by mouth every 6 (six) hours as needed for mild pain.  [name] albuterol (PROVENTIL HFA;VENTOLIN  HFA) 108 (90 Base) MCG/ACT inhaler Inhale 2 puffs into the lungs every 6 (six) hours as needed for wheezing or shortness of breath. (Patient not taking: Reported on 08/25/2018)  . amLODipine (NORVASC) 5 MG tablet Take 1 tablet (5 mg total) by mouth daily.  [name] atorvastatin (LIPITOR) 20 MG tablet Take 1 tablet (20 mg total) by mouth daily. (Patient not taking: Reported on 08/25/2018)  . Blood Pressure Monitoring (BLOOD PRESSURE MONITOR AUTOMAT) DEVI 1 Device by Does not apply route daily.  . famotidine (PEPCID) 20 MG tablet Take 1 tablet (20 mg total) by mouth 2 (two) times daily. (Patient not taking: Reported on 08/25/2018)  . fluticasone (FLONASE) 50 MCG/ACT nasal spray Place 1 spray into both nostrils 2 (two) times daily as needed for allergies or rhinitis.  [name] meclizine (ANTIVERT) 25 MG tablet Take 1 tablet (25 mg total) by mouth 3 (three) times daily as needed for dizziness.  . metoprolol tartrate (LOPRESSOR) 25 MG tablet Take 0.5 tablets (12.5 mg total) by mouth 2 (two) times daily.  . pantoprazole (PROTONIX) 40 MG tablet Take 1 tablet (40 mg total) by mouth daily.  . pantoprazole (PROTONIX) 40 MG tablet TAKE 1 TABLET BY MOUTH EVERY DAY  . triamcinolone cream (KENALOG) 0.1 % Apply 1 application topically 2 (two) times daily as needed. (Patient taking  differently: Apply 1 application topically 2 (two) times daily as needed (to affected sites- for itching). )  . valACYclovir (VALTREX) 500 MG tablet 1 TAB TWICE DAILY FOR 3 DAYS WITH OUTBREAKS, START WITHIN 48 HOURS AFTER ONSET. (Patient taking differently: Take 500 mg by mouth 2 (two) times daily. FOR 3 DAYS WITH OUTBREAKS, START WITHIN 48 HOURS AFTER ONSET.)   No facility-administered encounter medications on file as of 10/28/2018.      Review of Systems  Review of Systems   Physical Exam  LMP 01/07/2010 (LMP Unknown)   Wt Readings from Last 5 Encounters:  08/25/18 237 lb (107.5 kg)  06/23/18 227 lb 3.2 oz (103.1 kg)  05/25/18 221 lb (100.2  kg)  07/19/17 213 lb (96.6 kg)  07/12/17 213 lb 8 oz (96.8 kg)     Physical Exam   Lab Results:  CBC    Component Value Date/Time   WBC 14.5 (H) 08/25/2018 1003   RBC 4.18 08/25/2018 1003   HGB 12.7 08/25/2018 1003   HCT 38.1 08/25/2018 1003   PLT 301 08/25/2018 1003   MCV 91.1 08/25/2018 1003   MCV 95.2 10/06/2012 1813   MCH 30.4 08/25/2018 1003   MCHC 33.3 08/25/2018 1003   RDW 11.9 08/25/2018 1003   LYMPHSABS 1.6 08/25/2018 1003   MONOABS 2.3 (H) 08/25/2018 1003   EOSABS 0.0 08/25/2018 1003   BASOSABS 0.0 08/25/2018 1003    BMET    Component Value Date/Time   NA 136 08/25/2018 1003   K 4.5 08/25/2018 1003   CL 100 08/25/2018 1003   CO2 25 08/25/2018 1003   GLUCOSE 118 (H) 08/25/2018 1003   BUN 7 (L) 08/25/2018 1003   CREATININE 0.96 08/25/2018 1003   CALCIUM 9.0 08/25/2018 1003   GFRNONAA >60 08/25/2018 1003   GFRAA >60 08/25/2018 1003    BNP No results found for: BNP  ProBNP No results found for: PROBNP    Assessment & Plan:   No problem-specific Assessment & Plan notes found for this encounter.    No follow-ups on file.   Lauraine Rinne, NP 10/26/2018   This appointment was *** minutes long with over 50% of the time in direct face-to-face patient care, assessment, plan of care, and follow-up.

## 2018-10-26 NOTE — Telephone Encounter (Signed)
10/26/2018 2113  To more accurately document.  The HST order was placed without providers knowledge or approval.  That is why the order was denied.   Wyn Quaker FNP

## 2018-10-28 ENCOUNTER — Ambulatory Visit: Payer: 59 | Admitting: Pulmonary Disease

## 2018-11-06 ENCOUNTER — Ambulatory Visit: Payer: 59 | Admitting: Pulmonary Disease

## 2018-11-07 ENCOUNTER — Telehealth: Payer: Self-pay

## 2018-11-07 NOTE — Telephone Encounter (Signed)
Please advise. Should this be a virtual visit?

## 2018-11-07 NOTE — Telephone Encounter (Signed)
Copied from [place] [id number]. Topic: Appointment Scheduling - Scheduling Inquiry for Clinic >> Nov 07, 2018  1:47 PM Virl Axe D wrote: Reason for CRM: Pt called to schedule appt. Experiencing sore throat with white patches. Possibly Strep. No answer on FC line x3. Please return call. CB#[phone]

## 2018-11-10 NOTE — Telephone Encounter (Signed)
Patient called in stating she has still not gotten a call back from office to schedule appointment. Attempted FC line3x. Please advise and call back. Call back is [phone].

## 2018-11-10 NOTE — Progress Notes (Signed)
Virtual Visit via Telephone Note  I connected with Brianna Foster on 11/12/18 at  9:00 AM EDT by telephone and verified that I am speaking with the correct person using two identifiers.  Location: Patient: Home Provider: [facility] - [address], [place], [place], [place] [address]   I discussed the limitations, risks, security and privacy concerns of performing an evaluation and management service by telephone and the availability of in person appointments. I also discussed with the patient that there may be a patient responsible charge related to this service. The patient expressed understanding and agreed to proceed.  Patient consented to consult via telephone: Yes People present and their role in pt care: Pt   History of Present Illness: 63 year old female with severe obstructive sleep apnea on May/2019 home sleep study.  Patient never started CPAP therapy.  PMH: Hypertension, vertigo, hyperlipidemia, eczema Smoker/ Smoking History: Former Smoker. Quit 2015. 4 pack years.  Maintenance:  None  Pt of: Dr. [name]  Chief complaint: Severe OSA    63 year old former smoker. Pt was completed a telephone visit with our office today.  Patient is currently having symptoms of a sore throat.  Patient completed a video visit with primary care yesterday and was prescribed amoxicillin for 7 days.  Patient is looking to be retested for obstructive sleep apnea with a home sleep study.  Patient had a home sleep study done in May/2019 that showed severe obstructive sleep apnea with an AHI of 30.3.  Patient was never started on CPAP therapy.  Patient admits she is hesitant to start CPAP therapy as she sometimes has symptoms of claustrophobia when things are touching her nose.  She is also interested in potentially an oral appliance.  We discussed today that CPAP may be a better option for her to try initially based off of the severity of her sleep apnea in 2019.  But we can always  reevaluate based off the home sleep study that she performs this year.  Observations/Objective:  HST 08/26/17 >> AHI 30.3, SaO2 low 71%.  Assessment and Plan:  OSA (obstructive sleep apnea) Severe obstructive sleep apnea in 2019 home sleep study, never started on CPAP Patient has concerns of claustrophobia  Plan: Complete home sleep study in about 2 weeks after patient is finished antibiotics, is afebrile and has had at least no symptoms for 72 hours Patient likely will need follow-up with our office after completing home sleep study to discuss options   Follow Up Instructions:  Return in about 6 weeks (around 12/24/2018), or if symptoms worsen or fail to improve, for Follow up with Wyn Quaker FNP-C, Follow up with Dr. [name], after home sleep study.   I discussed the assessment and treatment plan with the patient. The patient was provided an opportunity to ask questions and all were answered. The patient agreed with the plan and demonstrated an understanding of the instructions.   The patient was advised to call back or seek an in-person evaluation if the symptoms worsen or if the condition fails to improve as anticipated.  I provided 23 minutes of non-face-to-face time during this encounter.   Lauraine Rinne, NP

## 2018-11-11 ENCOUNTER — Other Ambulatory Visit: Payer: Self-pay

## 2018-11-11 ENCOUNTER — Telehealth (INDEPENDENT_AMBULATORY_CARE_PROVIDER_SITE_OTHER): Payer: 59 | Admitting: Internal Medicine

## 2018-11-11 DIAGNOSIS — I10 Essential (primary) hypertension: Secondary | ICD-10-CM

## 2018-11-11 DIAGNOSIS — J020 Streptococcal pharyngitis: Secondary | ICD-10-CM | POA: Diagnosis not present

## 2018-11-11 MED ORDER — METOPROLOL TARTRATE 25 MG PO TABS: 12.5000 mg | ORAL_TABLET | Freq: Two times a day (BID) | ORAL | 3 refills | Status: DC

## 2018-11-11 MED ORDER — AMOXICILLIN 500 MG PO CAPS: 500.0000 mg | ORAL_CAPSULE | Freq: Three times a day (TID) | ORAL | 0 refills | Status: AC

## 2018-11-11 NOTE — Telephone Encounter (Signed)
Patient had a visit with Dr. Jerilee Hoh today

## 2018-11-11 NOTE — Progress Notes (Signed)
Virtual Visit via Video Note  I connected with Brianna Foster on 11/11/18 at  1:30 PM EDT by a video enabled telemedicine application and verified that I am speaking with the correct person using two identifiers.  Location patient: home Location provider: work office Persons participating in the virtual visit: patient, provider  I discussed the limitations of evaluation and management by telemedicine and the availability of in person appointments. The patient expressed understanding and agreed to proceed.   HPI: She has scheduled this acute visit to discuss a sore throat. She first noticed it last week. She has had no fevers, chills, SOB or cough. She has noticed some white spots in the back of her throat. A little painful to swallow. She has been doing salt water gargles and Aleve with some relief. Tried getting a appt last week with PCP but was unable to. No known COVID exposures. Wears a mask at work.  No other URI or GI symptoms.   ROS: Constitutional: Denies fever, chills, diaphoresis, appetite change and fatigue.  HEENT: Denies photophobia, eye pain, redness, hearing loss, ear pain, congestion,  rhinorrhea, sneezing, mouth sores, trouble swallowing, neck pain, neck stiffness and tinnitus.   Respiratory: Denies SOB, DOE, cough, chest tightness,  and wheezing.   Cardiovascular: Denies chest pain, palpitations and leg swelling.  Gastrointestinal: Denies nausea, vomiting, abdominal pain, diarrhea, constipation, blood in stool and abdominal distention.  Genitourinary: Denies dysuria, urgency, frequency, hematuria, flank pain and difficulty urinating.  Endocrine: Denies: hot or cold intolerance, sweats, changes in hair or nails, polyuria, polydipsia. Musculoskeletal: Denies myalgias, back pain, joint swelling, arthralgias and gait problem.  Skin: Denies pallor, rash and wound.  Neurological: Denies dizziness, seizures, syncope, weakness, light-headedness, numbness and headaches.   Hematological: Denies adenopathy. Easy bruising, personal or family bleeding history  Psychiatric/Behavioral: Denies suicidal ideation, mood changes, confusion, nervousness, sleep disturbance and agitation   Past Medical History:  Diagnosis Date  . GERD (gastroesophageal reflux disease)   . Headache(784.0)   . Hypertension   . OSA (obstructive sleep apnea) 08/28/2017    Past Surgical History:  Procedure Laterality Date  . LEFT HEART CATH AND CORONARY ANGIOGRAPHY N/A 07/11/2017   Procedure: LEFT HEART CATH AND CORONARY ANGIOGRAPHY;  Surgeon: Martinique, Peter M, MD;  Location: [facility];  Service: Cardiovascular;  Laterality: N/A;  . LEFT HEART CATHETERIZATION WITH CORONARY ANGIOGRAM N/A 05/11/2013   Procedure: LEFT HEART CATHETERIZATION WITH CORONARY ANGIOGRAM;  Surgeon: Burnell Blanks, MD;  Location: [facility];  Service: Cardiovascular;  Laterality: N/A;  . neck fusion      Family History  Problem Relation Age of Onset  . Lung cancer Mother   . Stomach cancer Father   . Heart disease Brother        Pacemaker  . Esophageal cancer Brother   . Breast cancer Sister     SOCIAL HX:   reports that she has quit smoking. Her smoking use included cigarettes. She has a 4.00 pack-year smoking history. She has never used smokeless tobacco. She reports that she does not drink alcohol or use drugs.   Current Outpatient Medications:  .  acetaminophen (TYLENOL) 500 MG tablet, Take 500 mg by mouth every 6 (six) hours as needed for mild pain., Disp: , Rfl:  .  albuterol (PROVENTIL HFA;VENTOLIN HFA) 108 (90 Base) MCG/ACT inhaler, Inhale 2 puffs into the lungs every 6 (six) hours as needed for wheezing or shortness of breath. (Patient not taking: Reported on 08/25/2018), Disp: 1  Inhaler, Rfl: 0 .  amLODipine (NORVASC) 5 MG tablet, Take 1 tablet (5 mg total) by mouth daily., Disp: 90 tablet, Rfl: 2 .  amoxicillin (AMOXIL) 500 MG capsule, Take 1 capsule (500 mg total) by mouth 3 (three)  times daily for 7 days., Disp: 21 capsule, Rfl: 0 .  atorvastatin (LIPITOR) 20 MG tablet, Take 1 tablet (20 mg total) by mouth daily. (Patient not taking: Reported on 08/25/2018), Disp: 90 tablet, Rfl: 1 .  Blood Pressure Monitoring (BLOOD PRESSURE MONITOR AUTOMAT) DEVI, 1 Device by Does not apply route daily., Disp: 1 Device, Rfl: 0 .  famotidine (PEPCID) 20 MG tablet, Take 1 tablet (20 mg total) by mouth 2 (two) times daily. (Patient not taking: Reported on 08/25/2018), Disp: 30 tablet, Rfl: 0 .  fluticasone (FLONASE) 50 MCG/ACT nasal spray, Place 1 spray into both nostrils 2 (two) times daily as needed for allergies or rhinitis., Disp: 16 g, Rfl: 5 .  meclizine (ANTIVERT) 25 MG tablet, Take 1 tablet (25 mg total) by mouth 3 (three) times daily as needed for dizziness., Disp: 30 tablet, Rfl: 2 .  metoprolol tartrate (LOPRESSOR) 25 MG tablet, Take 0.5 tablets (12.5 mg total) by mouth 2 (two) times daily., Disp: 60 tablet, Rfl: 3 .  pantoprazole (PROTONIX) 40 MG tablet, Take 1 tablet (40 mg total) by mouth daily., Disp: 90 tablet, Rfl: 2 .  pantoprazole (PROTONIX) 40 MG tablet, TAKE 1 TABLET BY MOUTH EVERY DAY, Disp: 30 tablet, Rfl: 2 .  triamcinolone cream (KENALOG) 0.1 %, Apply 1 application topically 2 (two) times daily as needed. (Patient taking differently: Apply 1 application topically 2 (two) times daily as needed (to affected sites- for itching). ), Disp: 45 g, Rfl: 1 .  valACYclovir (VALTREX) 500 MG tablet, 1 TAB TWICE DAILY FOR 3 DAYS WITH OUTBREAKS, START WITHIN 48 HOURS AFTER ONSET. (Patient taking differently: Take 500 mg by mouth 2 (two) times daily. FOR 3 DAYS WITH OUTBREAKS, START WITHIN 48 HOURS AFTER ONSET.), Disp: 18 tablet, Rfl: 1  EXAM:   VITALS per patient if applicable: none reported  GENERAL: alert, oriented, appears well and in no acute distress  HEENT: have examined to the best of my ability over video. She does appear to have pharyngeal erythema and tonsillar exudates.   NECK: normal movements of the head and neck  LUNGS: on inspection no signs of respiratory distress, breathing rate appears normal, no obvious gross increased work of breathing, gasping or wheezing  CV: no obvious cyanosis  MS: moves all visible extremities without noticeable abnormality  PSYCH/NEURO: pleasant and cooperative, no obvious depression or anxiety, speech and thought processing grossly intact  ASSESSMENT AND PLAN:   Strep throat  -Based on limited virtual evaluation, cannot r/o strep throat. -Given tonsillar exudates, will elect to treat as such empirically with amoxicillin for 7 days. -Unlikely COVID, but advised to contact us if no improvement in 7-10 days.  Essential hypertension -Requesting metoprolol refills today. -Will continue f/u with PCP.    I discussed the assessment and treatment plan with the patient. The patient was provided an opportunity to ask questions and all were answered. The patient agreed with the plan and demonstrated an understanding of the instructions.   The patient was advised to call back or seek an in-person evaluation if the symptoms worsen or if the condition fails to improve as anticipated.    Lelon Frohlich, MD  [facility]

## 2018-11-11 NOTE — Telephone Encounter (Signed)
Because sore throat we can not see her in the clinic. It could be viral. Can she have a doxy appt? Another options is to go to acute care facility ,where she could be screened for strep.  Thanks, BJ

## 2018-11-12 ENCOUNTER — Encounter: Payer: Self-pay | Admitting: Pulmonary Disease

## 2018-11-12 ENCOUNTER — Ambulatory Visit: Payer: 59

## 2018-11-12 ENCOUNTER — Other Ambulatory Visit: Payer: Self-pay

## 2018-11-12 ENCOUNTER — Ambulatory Visit (INDEPENDENT_AMBULATORY_CARE_PROVIDER_SITE_OTHER): Payer: 59 | Admitting: Pulmonary Disease

## 2018-11-12 DIAGNOSIS — G4733 Obstructive sleep apnea (adult) (pediatric): Secondary | ICD-10-CM

## 2018-11-12 NOTE — Patient Instructions (Signed)
We will proceed forward with a home sleep study in August/2020 after you have completed your antibiotics and you are afebrile and have not had any symptoms for 72 hours  Our patient care coordinator will contact you to get this scheduled  We can plan on having a video visit to discuss treatment options after we have received her home sleep study results  Return in about 6 weeks (around 12/24/2018), or if symptoms worsen or fail to improve, for Follow up with Brianna Foster, Follow up with Dr. [name], after home sleep study.  Coronavirus (COVID-19) Are you at risk?  Are you at risk for the Coronavirus (COVID-19)?  To be considered HIGH RISK for Coronavirus (COVID-19), you have to meet the following criteria:  . Traveled to [place], [place], [place], [place] or [place]; or in the [place] to [place], [place], [place], or [place]; and have fever, cough, and shortness of breath within the last 2 weeks of travel OR . Been in close contact with a person diagnosed with COVID-19 within the last 2 weeks and have fever, cough, and shortness of breath . IF YOU DO NOT MEET THESE CRITERIA, YOU ARE CONSIDERED LOW RISK FOR COVID-19.  What to do if you are HIGH RISK for COVID-19?  [name] If you are having a medical emergency, call 911. . Seek medical care right away. Before you go to a doctor's office, urgent care or emergency department, call ahead and tell them about your recent travel, contact with someone diagnosed with COVID-19, and your symptoms. You should receive instructions from your physician's office regarding next steps of care.  . When you arrive at healthcare provider, tell the healthcare staff immediately you have returned from visiting [place], [place], [place], [place] or [place]; or traveled in the [place] to [place], [place], [place], or [place]; in the last two weeks or you have been in close contact with a person diagnosed with COVID-19 in the last 2 weeks.   . Tell the  health care staff about your symptoms: fever, cough and shortness of breath. . After you have been seen by a medical provider, you will be either: o Tested for (COVID-19) and discharged home on quarantine except to seek medical care if symptoms worsen, and asked to  - Stay home and avoid contact with others until you get your results (4-5 days)  - Avoid travel on public transportation if possible (such as bus, train, or airplane) or o Sent to the Emergency Department by EMS for evaluation, COVID-19 testing, and possible admission depending on your condition and test results.  What to do if you are LOW RISK for COVID-19?  Reduce your risk of any infection by using the same precautions used for avoiding the common cold or flu:  [name] Wash your hands often with soap and warm water for at least 20 seconds.  If soap and water are not readily available, use an alcohol-based hand sanitizer with at least 60% alcohol.  . If coughing or sneezing, cover your mouth and nose by coughing or sneezing into the elbow areas of your shirt or coat, into a tissue or into your sleeve (not your hands). . Avoid shaking hands with others and consider head nods or verbal greetings only. . Avoid touching your eyes, nose, or mouth with unwashed hands.  . Avoid close contact with people who are sick. . Avoid places or events with large numbers of people in one location, like concerts or sporting  events. . Carefully consider travel plans you have or are making. . If you are planning any travel outside or inside the [place], visit the CDC's Travelers' Health webpage for the latest health notices. . If you have some symptoms but not all symptoms, continue to monitor at home and seek medical attention if your symptoms worsen. . If you are having a medical emergency, call 911.   [place] / e-Visit: [email or web address]         [facility]: [facility]: [phone]                   [facility]: [phone]           It is flu season:   >>> Best ways to protect herself from the flu: Receive the yearly flu vaccine, practice good hand hygiene washing with soap and also using hand sanitizer when available, eat a nutritious meals, get adequate rest, hydrate appropriately   Please contact the office if your symptoms worsen or you have concerns that you are not improving.   Thank you for choosing [facility] for your healthcare, and for allowing [place] to partner with you on your healthcare journey. I am thankful to be able to provide care to you today.   Brianna Foster

## 2018-11-12 NOTE — Assessment & Plan Note (Signed)
Severe obstructive sleep apnea in 2019 home sleep study, never started on CPAP Patient has concerns of claustrophobia  Plan: Complete home sleep study in about 2 weeks after patient is finished antibiotics, is afebrile and has had at least no symptoms for 72 hours Patient likely will need follow-up with our office after completing home sleep study to discuss options

## 2018-11-18 ENCOUNTER — Telehealth: Payer: Self-pay | Admitting: *Deleted

## 2018-11-18 NOTE — Telephone Encounter (Signed)
Copied from [place] [id number]. Topic: Appointment Scheduling - Scheduling Inquiry for Clinic >> Nov 18, 2018  3:13 PM Erick Blinks wrote: Reason for CRM: Pt just left the ER, was advised to have endoscopy done. Please advise. BP was elevated, was advised to add water pill. She says her feet have been swelling as well. [phone] Best contact

## 2018-11-18 NOTE — Telephone Encounter (Signed)
Please advise 

## 2018-11-19 ENCOUNTER — Encounter: Payer: Self-pay | Admitting: Gastroenterology

## 2018-11-19 NOTE — Telephone Encounter (Signed)
Pt has been scheduled. No further action needed.

## 2018-11-19 NOTE — Telephone Encounter (Signed)
Most likely she was advised to arrange an ER follow up visit. Recommend checking BP daily and have reading available if we do a virtual visit. Thanks, BJ

## 2018-11-24 ENCOUNTER — Other Ambulatory Visit: Payer: Self-pay

## 2018-11-24 ENCOUNTER — Telehealth (INDEPENDENT_AMBULATORY_CARE_PROVIDER_SITE_OTHER): Payer: 59 | Admitting: Family Medicine

## 2018-11-24 DIAGNOSIS — J358 Other chronic diseases of tonsils and adenoids: Secondary | ICD-10-CM

## 2018-11-24 DIAGNOSIS — R079 Chest pain, unspecified: Secondary | ICD-10-CM

## 2018-11-24 DIAGNOSIS — I10 Essential (primary) hypertension: Secondary | ICD-10-CM | POA: Diagnosis not present

## 2018-11-24 DIAGNOSIS — K219 Gastro-esophageal reflux disease without esophagitis: Secondary | ICD-10-CM | POA: Diagnosis not present

## 2018-11-24 DIAGNOSIS — M542 Cervicalgia: Secondary | ICD-10-CM | POA: Diagnosis not present

## 2018-11-24 MED ORDER — AMLODIPINE BESYLATE 5 MG PO TABS: 2.5000 mg | ORAL_TABLET | Freq: Every day | ORAL | 2 refills | Status: DC

## 2018-11-24 MED ORDER — FAMOTIDINE 40 MG PO TABS: 40.0000 mg | ORAL_TABLET | Freq: Every day | ORAL | 0 refills | Status: DC

## 2018-11-24 MED ORDER — LOSARTAN POTASSIUM-HCTZ 50-12.5 MG PO TABS: 1.0000 | ORAL_TABLET | Freq: Every day | ORAL | 0 refills | Status: DC

## 2018-11-24 MED ORDER — FAMOTIDINE 20 MG PO TABS: 20.0000 mg | ORAL_TABLET | Freq: Two times a day (BID) | ORAL | 0 refills | Status: DC

## 2018-11-24 NOTE — Assessment & Plan Note (Signed)
Problem is not well controlled. Continue Protonix 40 mg daily. Pepcid 40 mg added to take at bedtime. GERD precautions discussed. GI referral placed.

## 2018-11-24 NOTE — Progress Notes (Signed)
Virtual Visit via Video Note   I connected with Brianna Foster on 11/24/18 by a video enabled telemedicine application and verified that I am speaking with the correct person using two identifiers.  Location patient: home Location provider:work office Persons participating in the virtual visit: patient, provider  I discussed the limitations of evaluation and management by telemedicine and the availability of in person appointments. The patient expressed understanding and agreed to proceed.   HPI: Brianna Foster is a 63 yo female who is following on recent ER visit. He was evaluated on 11/18/2018 with a complaint of chest pain. She has had similar symptoms in the past. 11/18/2018 labs done in the ED:  CBC AND DIFFERENTIAL - Abnormal  Result Value  WBC 5.9  RBC 4.51  HGB 13.9  HCT 40.1  MCV 89  MCH 30.8  MCHC 34.7  Plt Ct 263  RDW SD 40.4  MPV 9.2  NRBC% 0.0  NRBC 0.000  NEUTROPHIL % 50.5  LYMPHOCYTE % 33.4  MONOCYTE % 14.3 (*)  Eosinophil % 1.2  BASOPHIL % 0.3  IG% 0.300  ABSOLUTE NEUTROPHIL COUNT 2.95  ABSOLUTE LYMPHOCYTE COUNT 2.0  MONO ABSOLUTE 0.8  EOS ABSOLUTE 0.1  BASO ABSOLUTE 0.0  IG ABSOLUTE 0.020  TROPONIN T - Normal  Trop T <0.010   Mg 2.0  COMPREHENSIVE METABOLIC PANEL  Na 676  Potassium 4.4  Cl 102  CO2 24  Glucose 96  BUN 13  Creatinine 0.62  Ca 9.4  ALK PHOS 92  T Bili 0.28  Total Protein 8.1  Alb 4.4  GLOBULIN 3.7  ALBUMIN/GLOBULIN RATIO 1.2  BUN/CREAT RATIO 21.0  ALT 22  AST 24  GFR AFRICAN AMERICAN 113    CXR and EKG otherwise negative.  Left Heart and coronary angiography done in 07/2017.  The left ventricular systolic function is normal.  LV end diastolic pressure is normal.  The left ventricular ejection fraction is 55-65% by visual estimate.   1. Normal coronary anatomy 2. Normal LV function 3. Normal LVEDP    + Heartburn. CP and heartburn "complely resolved" after taking Protonix + Tums. In the past she has tried  omeprazole. She states that Nexium may have helped before, not sure. In the past Pepcid 20 mg twice daily has been prescribed but she did not start.  She denies abdominal pain, nausea, vomiting, changes in bowel habits, or melena. Negative for associated palpitations, diaphoresis, or dyspnea.  Concern about lower extremity edema, worse at the end of the day. She denies erythema or pain. She states that in the past she was taking a "fluid pill", which was helping with swelling. She has not noted gross hematuria, foam in urine, or decreased urine output.  Hypertension, currently she is on amlodipine 5 mg and metoprolol tartrate 12.5 mg twice daily. BP was elevated at 178/77 and 198/92 during ER visit. She has not monitor BP at home.  Denies unusual headache, visual changes, orthopnea, or PND.   Lab Results  Component Value Date   CREATININE 0.96 08/25/2018   BUN 7 (L) 08/25/2018   NA 136 08/25/2018   K 4.5 08/25/2018   CL 100 08/25/2018   CO2 25 08/25/2018   Intermittent episodes of sore throat. According to patient, she was treated for strep but symptoms have not resolved.  She has seen "white spots" on tonsils. Negative for sick contact. Denies fever, chills, body aches, cough, or wheezing.  Left-sided neck pain, exacerbated by movement and alleviated by opening mouth, TMJ ROM movement.  Problem has been going on for a few days. Pain is not radiated. Denies upper extremity numbness or tingling. Negative for rash. No history of trauma. She has not tried OTC medication.   ROS: See pertinent positives and negatives per HPI.  Past Medical History:  Diagnosis Date  . GERD (gastroesophageal reflux disease)   . Headache(784.0)   . Hypertension   . OSA (obstructive sleep apnea) 08/28/2017    Past Surgical History:  Procedure Laterality Date  . LEFT HEART CATH AND CORONARY ANGIOGRAPHY N/A 07/11/2017   Procedure: LEFT HEART CATH AND CORONARY ANGIOGRAPHY;  Surgeon: Martinique,  Peter M, MD;  Location: [facility];  Service: Cardiovascular;  Laterality: N/A;  . LEFT HEART CATHETERIZATION WITH CORONARY ANGIOGRAM N/A 05/11/2013   Procedure: LEFT HEART CATHETERIZATION WITH CORONARY ANGIOGRAM;  Surgeon: Burnell Blanks, MD;  Location: [facility] CATH [facility];  Service: Cardiovascular;  Laterality: N/A;  . neck fusion      Family History  Problem Relation Age of Onset  . Lung cancer Mother   . Stomach cancer Father   . Heart disease Brother        Pacemaker  . Esophageal cancer Brother   . Breast cancer Sister     Social History   Socioeconomic History  . Marital status: Divorced    Spouse name: Not on file  . Number of children: 1  . Years of education: College  . Highest education level: Not on file  Occupational History    Comment: [employer]  Social Needs  . Financial resource strain: Not on file  . Food insecurity    Worry: Not on file    Inability: Not on file  . Transportation needs    Medical: Not on file    Non-medical: Not on file  Tobacco Use  . Smoking status: Former Smoker    Packs/day: 0.25    Years: 16.00    Pack years: 4.00    Types: Cigarettes    Quit date: 04/09/2013    Years since quitting: 5.6  . Smokeless tobacco: Never Used  Substance and Sexual Activity  . Alcohol use: No    Comment: Occassional Use  . Drug use: No  . Sexual activity: Not Currently    Birth control/protection: Post-menopausal  Lifestyle  . Physical activity    Days per week: Not on file    Minutes per session: Not on file  . Stress: Not on file  Relationships  . Social Herbalist on phone: Not on file    Gets together: Not on file    Attends religious service: Not on file    Active member of club or organization: Not on file    Attends meetings of clubs or organizations: Not on file    Relationship status: Not on file  . Intimate partner violence    Fear of current or ex partner: Not on file    Emotionally abused: Not on file     Physically abused: Not on file    Forced sexual activity: Not on file  Other Topics Concern  . Not on file  Social History Narrative   Lives at home alone   Right-handed   Drinks decaf coffee    Current Outpatient Medications:  .  acetaminophen (TYLENOL) 500 MG tablet, Take 500 mg by mouth every 6 (six) hours as needed for mild pain., Disp: , Rfl:  .  albuterol (PROVENTIL HFA;VENTOLIN HFA) 108 (90 Base) MCG/ACT inhaler, Inhale 2 puffs into  the lungs every 6 (six) hours as needed for wheezing or shortness of breath., Disp: 1 Inhaler, Rfl: 0 .  amLODipine (NORVASC) 5 MG tablet, Take 0.5 tablets (2.5 mg total) by mouth daily., Disp: 90 tablet, Rfl: 2 .  atorvastatin (LIPITOR) 20 MG tablet, Take 1 tablet (20 mg total) by mouth daily., Disp: 90 tablet, Rfl: 1 .  Blood Pressure Monitoring (BLOOD PRESSURE MONITOR AUTOMAT) DEVI, 1 Device by Does not apply route daily., Disp: 1 Device, Rfl: 0 .  famotidine (PEPCID) 40 MG tablet, Take 1 tablet (40 mg total) by mouth at bedtime., Disp: 90 tablet, Rfl: 0 .  fluticasone (FLONASE) 50 MCG/ACT nasal spray, Place 1 spray into both nostrils 2 (two) times daily as needed for allergies or rhinitis., Disp: 16 g, Rfl: 5 .  losartan-hydrochlorothiazide (HYZAAR) 50-12.5 MG tablet, Take 1 tablet by mouth daily., Disp: 90 tablet, Rfl: 0 .  meclizine (ANTIVERT) 25 MG tablet, Take 1 tablet (25 mg total) by mouth 3 (three) times daily as needed for dizziness., Disp: 30 tablet, Rfl: 2 .  metoprolol tartrate (LOPRESSOR) 25 MG tablet, Take 0.5 tablets (12.5 mg total) by mouth 2 (two) times daily., Disp: 60 tablet, Rfl: 3 .  pantoprazole (PROTONIX) 40 MG tablet, Take 1 tablet (40 mg total) by mouth daily., Disp: 90 tablet, Rfl: 2 .  pantoprazole (PROTONIX) 40 MG tablet, TAKE 1 TABLET BY MOUTH EVERY DAY, Disp: 30 tablet, Rfl: 2 .  triamcinolone cream (KENALOG) 0.1 %, Apply 1 application topically 2 (two) times daily as needed. (Patient taking differently: Apply 1 application  topically 2 (two) times daily as needed (to affected sites- for itching). ), Disp: 45 g, Rfl: 1 .  valACYclovir (VALTREX) 500 MG tablet, 1 TAB TWICE DAILY FOR 3 DAYS WITH OUTBREAKS, START WITHIN 48 HOURS AFTER ONSET. (Patient taking differently: Take 500 mg by mouth 2 (two) times daily. FOR 3 DAYS WITH OUTBREAKS, START WITHIN 48 HOURS AFTER ONSET.), Disp: 18 tablet, Rfl: 1  EXAM:  VITALS per patient if applicable:LMP 09/98/3382 (LMP Unknown)   GENERAL: alert, oriented, appears well and in no acute distress  HEENT: atraumatic, conjunctiva clear, no obvious abnormalities on inspection except for whitish small rounded matter imbedded in right tonsil. No edema or erythema.  NECK: normal movements of the head and neck Neck ROM elicits left-sided neck pain.  LUNGS: on inspection no signs of respiratory distress, breathing rate appears normal, no obvious gross SOB, gasping or wheezing  CV: no obvious cyanosis  Brianna: moves all visible extremities without noticeable abnormality  PSYCH/NEURO: pleasant and cooperative, no obvious depression, + anxious. Speech and thought processing grossly intact  ASSESSMENT AND PLAN:  Discussed the following assessment and plan: Orders Placed This Encounter  Procedures  . Basic metabolic panel  . Ambulatory referral to Gastroenterology    Chest pain, unspecified type - Plan: We reviewed possible etiologies. She already had cardiac work-up negative. It seems to be related to GERD.  Neck pain on left side - Plan: Reassured,?  Muscle strain. No history of direct trauma, so I do not think imaging is needed at this time. Recommend topical IcyHot or Aspercreme. Local massage and ice may also help. Follow-up as needed.  Tonsil stone - Plan: Educated about diagnosis, prognosis, and treatment options. Explained that this is not an effective specific treatment. Recommend gargles with water. If problem is persistent, we could arrange ENT evaluation. She prefers  to hold on referral for now.  Gastroesophageal reflux disease Problem is not well controlled. Continue  Protonix 40 mg daily. Pepcid 40 mg added to take at bedtime. GERD precautions discussed. GI referral placed.  Essential hypertension BP was elevated during ER visit. Recommend monitoring BP regularly. Losartan-HCTZ 50-12.5 mg added today.BMP in 7-10 days. Amlodipine decreased from 63m to 2.5 mg daily. No changes in metoprolol for now. Continue low-salt diet. Follow-up in 6 to 8 weeks.    I discussed the assessment and treatment plan with the patient. She was provided an opportunity to ask questions and all were answered. She agreed with the plan and demonstrated an understanding of the instructions.   The patient was advised to call back or seek an in-person evaluation if the symptoms worsen or if the condition fails to improve as anticipated.   Betty JMartinique MD

## 2018-11-24 NOTE — Assessment & Plan Note (Addendum)
BP was elevated during ER visit. Recommend monitoring BP regularly. Losartan-HCTZ 50-12.5 mg added today.BMP in 7-10 days. Amlodipine decreased from 5mg  to 2.5 mg daily. No changes in metoprolol for now. Continue low-salt diet. Follow-up in 6 to 8 weeks.

## 2018-11-25 ENCOUNTER — Telehealth: Payer: Self-pay | Admitting: Pulmonary Disease

## 2018-11-25 NOTE — Telephone Encounter (Signed)
R/s to 9/1 @ 4:30 PM

## 2018-11-30 NOTE — Progress Notes (Signed)
Reviewed and agree with assessment/plan.   Sorayah Schrodt, MD [facility] 04/04/2016, 12:24 PM Pager:  [phone]  

## 2018-12-09 ENCOUNTER — Telehealth: Payer: Self-pay | Admitting: Pulmonary Disease

## 2018-12-09 ENCOUNTER — Telehealth: Payer: Self-pay

## 2018-12-09 NOTE — Telephone Encounter (Signed)
Error

## 2018-12-09 NOTE — Telephone Encounter (Signed)
R/S to 9/1 @ 4:30.  Nothing further needed at this time.

## 2018-12-19 ENCOUNTER — Ambulatory Visit: Payer: 59

## 2018-12-19 ENCOUNTER — Other Ambulatory Visit: Payer: Self-pay

## 2018-12-19 DIAGNOSIS — G4719 Other hypersomnia: Secondary | ICD-10-CM

## 2018-12-21 DIAGNOSIS — G4733 Obstructive sleep apnea (adult) (pediatric): Secondary | ICD-10-CM | POA: Diagnosis not present

## 2018-12-22 ENCOUNTER — Telehealth: Payer: Self-pay | Admitting: Pulmonary Disease

## 2018-12-22 DIAGNOSIS — G4733 Obstructive sleep apnea (adult) (pediatric): Secondary | ICD-10-CM

## 2018-12-22 NOTE — Telephone Encounter (Signed)
Noted. Thanks  B

## 2018-12-22 NOTE — Telephone Encounter (Signed)
Thank you   B

## 2018-12-22 NOTE — Telephone Encounter (Signed)
Noted. Will be happy to see her. From what I remember she is agreeable to CPAP start. You fine with APAP - 5-15? Aaron Edelman

## 2018-12-22 NOTE — Telephone Encounter (Signed)
Spoke with pt and advised of results and recommendations per Dr [name]. Appt made with Wyn Quaker, NP 12/24/18 9:00. Pt verbalized understanding. Nothing further needed at this time.

## 2018-12-22 NOTE — Telephone Encounter (Signed)
APAP 5 to 15 cm H2O is fine to start with.

## 2018-12-22 NOTE — Progress Notes (Signed)
@Patient  ID: Brianna Foster, female    DOB: 1955/04/23, 63 y.o.   MRN: [id number]  Chief Complaint  Patient presents with  . office visit    Here to discuss sleep study results and tx options. Feels SOB when first gets up in the.    Referring provider: Martinique, Betty G, MD  HPI:  63 year old female with severe obstructive sleep apnea on May/2019 home sleep study.  Patient never started CPAP therapy.  PMH: Hypertension, vertigo, hyperlipidemia, eczema Smoker/ Smoking History: Former Smoker. Quit 2015. 4 pack years.  Maintenance:  None  Pt of: Dr. [name]  12/24/2018  - Visit   63 year old female former smoker followed in our office for severe obstructive sleep apnea.  Patient is recently completed a home sleep study that confirmed once again that she has severe obstructive sleep apnea.  Recent sleep studies over the last 2 years are listed below:  HST 08/26/17 >> AHI 30.3, SaO2 low 71%.  HST 12/21/18 >> AHI 59.9, SpO2 low 76%  Patient is ready to start CPAP therapy she is interested in discussing other treatment options.  She had seen on TV that there is a surgical intervention for obstructive sleep apnea.  She is also been followed by primary care for recurrent tonsil stones or potential tonsillitis.  She was referred to an ENT this morning by primary care.   Tests:   HST 08/26/17 >> AHI 30.3, SaO2 low 71%.  HST 12/21/18 >> AHI 59.9, SpO2 low 76%  Results of the Epworth flowsheet 05/24/2017  Sitting and reading 0  Watching TV 3  Sitting, inactive in a public place (e.g. a theatre or a meeting) 1  As a passenger in a car for an hour without a break 0  Lying down to rest in the afternoon when circumstances permit 3  Sitting and talking to someone 0  Sitting quietly after a lunch without alcohol 3  In a car, while stopped for a few minutes in traffic 0  Total score 10     FENO:  No results found for: NITRICOXIDE  PFT: No flowsheet data found.  Imaging: No results  found.    Specialty Problems      Pulmonary Problems   OSA (obstructive sleep apnea)    HST 08/26/17 >> AHI 30.3, SaO2 low 71%.  HST 12/21/18 >> AHI 59.9, SpO2 low 76%       Rhinitis, allergic      Allergies  Allergen Reactions  . Other Anaphylaxis, Shortness Of Breath and Swelling    [place] NUTS    Immunization History  Administered Date(s) Administered  . Influenza Split 02/03/2008  . Tdap 12/11/2016    Past Medical History:  Diagnosis Date  . GERD (gastroesophageal reflux disease)   . Headache(784.0)   . Hypertension   . OSA (obstructive sleep apnea) 08/28/2017    Tobacco History: Social History   Tobacco Use  Smoking Status Former Smoker  . Packs/day: 0.25  . Years: 16.00  . Pack years: 4.00  . Types: Cigarettes  . Quit date: 03/09/2018  . Years since quitting: 0.7  Smokeless Tobacco Never Used   Counseling given: Not Answered   Continue to not smoke  Outpatient Encounter Medications as of 12/24/2018  Medication Sig  . acetaminophen (TYLENOL) 500 MG tablet Take 500 mg by mouth every 6 (six) hours as needed for mild pain.  [name] albuterol (PROVENTIL HFA;VENTOLIN HFA) 108 (90 Base) MCG/ACT inhaler Inhale 2 puffs into the lungs every 6 (six) hours as  needed for wheezing or shortness of breath.  [name] amLODipine (NORVASC) 5 MG tablet Take 0.5 tablets (2.5 mg total) by mouth daily.  [name] atorvastatin (LIPITOR) 20 MG tablet Take 1 tablet (20 mg total) by mouth daily.  . Blood Pressure Monitoring (BLOOD PRESSURE MONITOR AUTOMAT) DEVI 1 Device by Does not apply route daily.  . cephALEXin (KEFLEX) 500 MG capsule Take 1 capsule (500 mg total) by mouth 3 (three) times daily for 7 days.  . famotidine (PEPCID) 40 MG tablet Take 1 tablet (40 mg total) by mouth at bedtime.  . fluticasone (FLONASE) 50 MCG/ACT nasal spray Place 1 spray into both nostrils 2 (two) times daily as needed for allergies or rhinitis.  . hydrochlorothiazide (HYDRODIURIL) 25 MG tablet Take 1 tablet (25 mg  total) by mouth daily.  [name] losartan-hydrochlorothiazide (HYZAAR) 50-12.5 MG tablet Take 1 tablet by mouth daily.  . meclizine (ANTIVERT) 25 MG tablet Take 1 tablet (25 mg total) by mouth 3 (three) times daily as needed for dizziness.  . metoprolol tartrate (LOPRESSOR) 25 MG tablet Take 0.5 tablets (12.5 mg total) by mouth 2 (two) times daily.  . pantoprazole (PROTONIX) 40 MG tablet Take 1 tablet (40 mg total) by mouth daily.  . pantoprazole (PROTONIX) 40 MG tablet TAKE 1 TABLET BY MOUTH EVERY DAY  . triamcinolone cream (KENALOG) 0.1 % Apply 1 application topically 2 (two) times daily as needed. (Patient taking differently: Apply 1 application topically 2 (two) times daily as needed (to affected sites- for itching). )  . valACYclovir (VALTREX) 500 MG tablet 1 TAB TWICE DAILY FOR 3 DAYS WITH OUTBREAKS, START WITHIN 48 HOURS AFTER ONSET. (Patient taking differently: Take 500 mg by mouth 2 (two) times daily. FOR 3 DAYS WITH OUTBREAKS, START WITHIN 48 HOURS AFTER ONSET.)   No facility-administered encounter medications on file as of 12/24/2018.      Review of Systems  Review of Systems  Constitutional: Negative for activity change, fatigue and fever.  HENT: Negative for sinus pressure, sinus pain and sore throat.   Respiratory: Negative for cough, shortness of breath and wheezing.   Cardiovascular: Negative for chest pain and palpitations.  Gastrointestinal: Negative for diarrhea, nausea and vomiting.       Persistent acid reflux Establishing with GI doc on 12/25/2018  Musculoskeletal: Negative for arthralgias.  Neurological: Negative for dizziness.  Psychiatric/Behavioral: Positive for sleep disturbance. The patient is not nervous/anxious.      Physical Exam  BP 140/78 (BP Location: Left Arm, Cuff Size: Normal)   Pulse 76   Temp (!) 97 F (36.1 C) (Oral)   Ht 5\' 8"  (1.727 m)   Wt 249 lb (112.9 kg)   LMP 01/07/2010 (LMP Unknown)   SpO2 98%   BMI 37.86 kg/m   Wt Readings from Last 5  Encounters:  12/24/18 249 lb (112.9 kg)  12/24/18 250 lb (113.4 kg)  08/25/18 237 lb (107.5 kg)  06/23/18 227 lb 3.2 oz (103.1 kg)  05/25/18 221 lb (100.2 kg)   Discussed recent weight gain with patient.  She reports that with COVID-19 and home isolation her weight has trended up.  Physical Exam Vitals signs and nursing note reviewed.  Constitutional:      General: She is not in acute distress.    Appearance: Normal appearance. She is obese.  HENT:     Head: Normocephalic and atraumatic.     Right Ear: External ear normal.     Left Ear: External ear normal.     Nose: Nose normal.  No congestion.     Mouth/Throat:     Mouth: Mucous membranes are moist.     Pharynx: Oropharynx is clear.     Comments: Mallampati 1 Eyes:     Pupils: Pupils are equal, round, and reactive to light.  Neck:     Musculoskeletal: Normal range of motion.  Cardiovascular:     Rate and Rhythm: Normal rate and regular rhythm.     Pulses: Normal pulses.     Heart sounds: Normal heart sounds. No murmur.  Pulmonary:     Effort: Pulmonary effort is normal. No respiratory distress.     Breath sounds: Normal breath sounds. No decreased air movement. No decreased breath sounds, wheezing or rales.  Skin:    General: Skin is warm and dry.     Capillary Refill: Capillary refill takes less than 2 seconds.  Neurological:     General: No focal deficit present.     Mental Status: She is alert and oriented to person, place, and time. Mental status is at baseline.     Gait: Gait normal.  Psychiatric:        Mood and Affect: Mood normal.        Behavior: Behavior normal.        Thought Content: Thought content normal.        Judgment: Judgment normal.      Lab Results:  CBC    Component Value Date/Time   WBC 14.5 (H) 08/25/2018 1003   RBC 4.18 08/25/2018 1003   HGB 12.7 08/25/2018 1003   HCT 38.1 08/25/2018 1003   PLT 301 08/25/2018 1003   MCV 91.1 08/25/2018 1003   MCV 95.2 10/06/2012 1813   MCH 30.4  08/25/2018 1003   MCHC 33.3 08/25/2018 1003   RDW 11.9 08/25/2018 1003   LYMPHSABS 1.6 08/25/2018 1003   MONOABS 2.3 (H) 08/25/2018 1003   EOSABS 0.0 08/25/2018 1003   BASOSABS 0.0 08/25/2018 1003    BMET    Component Value Date/Time   NA 136 08/25/2018 1003   K 4.5 08/25/2018 1003   CL 100 08/25/2018 1003   CO2 25 08/25/2018 1003   GLUCOSE 118 (H) 08/25/2018 1003   BUN 7 (L) 08/25/2018 1003   CREATININE 0.96 08/25/2018 1003   CALCIUM 9.0 08/25/2018 1003   GFRNONAA >60 08/25/2018 1003   GFRAA >60 08/25/2018 1003    BNP No results found for: BNP  ProBNP No results found for: PROBNP    Assessment & Plan:   OSA (obstructive sleep apnea) Plan: We will start CPAP therapy today APAP setting 5-15 Patient needs 52-month follow-up with our office  Patient to proceed forward with a ENT referral I would prefer the patient establish with Dr. Redmond Baseman at [facility] as patient has interest as well as potential questions regarding the inspire device which she may be a candidate for if she loses weight as well as failed CPAP therapy.  Gastroesophageal reflux disease Plan: Continue Protonix Continue Pepcid Proceed forward with GI referral on 12/25/2018 Review AVS material on acid reflux diet  Healthcare maintenance Patient declines flu vaccine today    Return in about 2 months (around 02/23/2019), or if symptoms worsen or fail to improve, for Follow up with Dr. [name], Follow up with Wyn Quaker FNP-C.   Lauraine Rinne, NP 12/24/2018   This appointment was 28 minutes long with over 50% of the time in direct face-to-face patient care, assessment, plan of care, and follow-up.

## 2018-12-22 NOTE — Telephone Encounter (Signed)
HST 12/21/18 >> AHI 59.9, SpO2 low 76%   Please inform her that her sleep study shows very severe obstructive sleep apnea.  Please arrange for ROV with me or Wyn Quaker to discuss treatment options.

## 2018-12-24 ENCOUNTER — Encounter: Payer: Self-pay | Admitting: Family Medicine

## 2018-12-24 ENCOUNTER — Other Ambulatory Visit: Payer: Self-pay

## 2018-12-24 ENCOUNTER — Ambulatory Visit (INDEPENDENT_AMBULATORY_CARE_PROVIDER_SITE_OTHER): Payer: 59 | Admitting: Pulmonary Disease

## 2018-12-24 ENCOUNTER — Encounter: Payer: Self-pay | Admitting: Pulmonary Disease

## 2018-12-24 ENCOUNTER — Ambulatory Visit: Payer: 59 | Admitting: Family Medicine

## 2018-12-24 VITALS — BP 140/78 | HR 76 | Temp 97.0°F | Ht 68.0 in | Wt 249.0 lb

## 2018-12-24 VITALS — BP 130/86 | HR 74 | Temp 98.0°F | Resp 12 | Ht 68.0 in | Wt 250.0 lb

## 2018-12-24 DIAGNOSIS — R600 Localized edema: Secondary | ICD-10-CM

## 2018-12-24 DIAGNOSIS — L03031 Cellulitis of right toe: Secondary | ICD-10-CM | POA: Diagnosis not present

## 2018-12-24 DIAGNOSIS — G4733 Obstructive sleep apnea (adult) (pediatric): Secondary | ICD-10-CM

## 2018-12-24 DIAGNOSIS — I10 Essential (primary) hypertension: Secondary | ICD-10-CM | POA: Diagnosis not present

## 2018-12-24 DIAGNOSIS — Z0000 Encounter for general adult medical examination without abnormal findings: Secondary | ICD-10-CM

## 2018-12-24 DIAGNOSIS — K219 Gastro-esophageal reflux disease without esophagitis: Secondary | ICD-10-CM

## 2018-12-24 DIAGNOSIS — J358 Other chronic diseases of tonsils and adenoids: Secondary | ICD-10-CM | POA: Diagnosis not present

## 2018-12-24 LAB — BASIC METABOLIC PANEL
BUN: 16 mg/dL (ref 6–23)
CO2: 28 mEq/L (ref 19–32)
Calcium: 9.5 mg/dL (ref 8.4–10.5)
Chloride: 104 mEq/L (ref 96–112)
Creatinine, Ser: 0.75 mg/dL (ref 0.40–1.20)
GFR: 94.8 mL/min (ref >60.00)
Glucose, Bld: 101 mg/dL — ABNORMAL HIGH (ref 70–99)
Potassium: 4.3 mEq/L (ref 3.5–5.1)
Sodium: 140 mEq/L (ref 135–145)

## 2018-12-24 LAB — TSH: TSH: 2.03 u[IU]/mL (ref 0.35–4.50)

## 2018-12-24 MED ORDER — HYDROCHLOROTHIAZIDE 25 MG PO TABS: 25.0000 mg | ORAL_TABLET | Freq: Every day | ORAL | 0 refills | Status: DC

## 2018-12-24 MED ORDER — CEPHALEXIN 500 MG PO CAPS: 500.0000 mg | ORAL_CAPSULE | Freq: Three times a day (TID) | ORAL | 0 refills | Status: AC

## 2018-12-24 NOTE — Patient Instructions (Addendum)
You were seen today by Lauraine Rinne, NP  for:   1. OSA (obstructive sleep apnea)  New CPAP start today DME: [employer] or  [facility] APAP setting 5-15 Mask of choice Supplies  We recommend that you start using your CPAP daily >>>Keep up the hard work using your device >>> Goal should be wearing this for the entire night that you are sleeping, at least 4 to 6 hours  Remember:   Do not drive or operate heavy machinery if tired or drowsy.   Please notify the supply company and office if you are unable to use your device regularly due to missing supplies or machine being broken.   Work on maintaining a healthy weight and following your recommended nutrition plan   Maintain proper daily exercise and movement   Maintaining proper use of your device can also help improve management of other chronic illnesses such as: Blood pressure, blood sugars, and weight management.   BiPAP/ CPAP Cleaning:  >>>Clean weekly, with Dawn soap, and bottle brush.  Set up to air dry.  - Ambulatory referral to ENT - [facility] or Wilburn Cornelia at [facility] - prefer [facility] as you have questions about inspire device in future   2. Gastroesophageal reflux disease, esophagitis presence not specified  Continue Protonix and Pepcid as ordered by primary care  Continue forward with GI referral tomorrow  GERD management: >>>Avoid laying flat until 2 hours after meals >>>Elevate head of the bed including entire chest >>>Reduce size of meals and amount of fat, acid, spices, caffeine and sweets >>>If you are smoking, Please stop! >>>Decrease alcohol consumption >>>Work on maintaining a healthy weight with normal BMI      We recommend today:  Orders Placed This Encounter  Procedures   Ambulatory referral to ENT    Referral Priority:   Routine    Referral Type:   Consultation    Referral Reason:   Specialty Services Required    Requested Specialty:   Otolaryngology    Number of Visits Requested:   1   Orders Placed  This Encounter  Procedures   Ambulatory referral to ENT   No orders of the defined types were placed in this encounter.   Follow Up:    Return in about 2 months (around 02/23/2019), or if symptoms worsen or fail to improve, for Follow up with Dr. [name], Follow up with Wyn Quaker FNP-C.   Please do your part to reduce the spread of COVID-19:      Reduce your risk of any infection  and COVID19 by using the similar precautions used for avoiding the common cold or flu:   Wash your hands often with soap and warm water for at least 20 seconds.  If soap and water are not readily available, use an alcohol-based hand sanitizer with at least 60% alcohol.   If coughing or sneezing, cover your mouth and nose by coughing or sneezing into the elbow areas of your shirt or coat, into a tissue or into your sleeve (not your hands).  WEAR A MASK when in public   Avoid shaking hands with others and consider head nods or verbal greetings only.  Avoid touching your eyes, nose, or mouth with unwashed hands.   Avoid close contact with people who are sick.  Avoid places or events with large numbers of people in one location, like concerts or sporting events.  If you have some symptoms but not all symptoms, continue to monitor at home and seek medical attention if  your symptoms worsen.  If you are having a medical emergency, call 911.   [place] / e-Visit: [email or web address]         [facility]: [facility]: [id number]                   [facility]: [id number]     It is flu season:   >>> Best ways to protect herself from the flu: Receive the yearly flu vaccine, practice good hand hygiene washing with soap and also using hand sanitizer when available, eat a nutritious meals, get adequate rest, hydrate appropriately   Please contact  the office if your symptoms worsen or you have concerns that you are not improving.   Thank you for choosing [facility] for your healthcare, and for allowing [place] to partner with you on your healthcare journey. I am thankful to be able to provide care to you today.   Wyn Quaker FNP-C     Living With Sleep Apnea Sleep apnea is a condition in which breathing pauses or becomes shallow during sleep. Sleep apnea is most commonly caused by a collapsed or blocked airway. People with sleep apnea snore loudly and have times when they gasp and stop breathing for 10 seconds or more during sleep. This happens over and over during the night. This disrupts your sleep and keeps your body from getting the rest that it needs, which can cause tiredness and lack of energy (fatigue) during the day. The breaks in breathing also interrupt the deep sleep that you need to feel rested. Even if you do not completely wake up from the gaps in breathing, your sleep may not be restful. You may also have a headache in the morning and low energy during the day, and you may feel anxious or depressed. How can sleep apnea affect me? Sleep apnea increases your chances of extreme tiredness during the day (daytime fatigue). It can also increase your risk for health conditions, such as:  Heart attack.  Stroke.  Diabetes.  Heart failure.  Irregular heartbeat.  High blood pressure. If you have daytime fatigue as a result of sleep apnea, you may be more likely to:  Perform poorly at school or work.  Fall asleep while driving.  Have difficulty with attention.  Develop depression or anxiety.  Become severely overweight (obese).  Have sexual dysfunction. What actions can I take to manage sleep apnea? Sleep apnea treatment   If you were given a device to open your airway while you sleep, use it only as told by your health care provider. You may be given: ? An oral appliance. This is a custom-made mouthpiece  that shifts your lower jaw forward. ? A continuous positive airway pressure (CPAP) device. This device blows air through a mask when you breathe out (exhale). ? A nasal expiratory positive airway pressure (EPAP) device. This device has valves that you put into each nostril. ? A bi-level positive airway pressure (BPAP) device. This device blows air through a mask when you breathe in (inhale) and breathe out (exhale).  You may need surgery if other treatments do not work for you. Sleep habits  Go to sleep and wake up at the same time every day. This helps set your internal clock (circadian rhythm) for sleeping. ? If you stay up later than usual, such as on weekends, try to get up in the morning within 2 hours of  your normal wake time.  Try to get at least 7-9 hours of sleep each night.  Stop computer, tablet, and mobile phone use a few hours before bedtime.  Do not take long naps during the day. If you nap, limit it to 30 minutes.  Have a relaxing bedtime routine. Reading or listening to music may relax you and help you sleep.  Use your bedroom only for sleep. ? Keep your television and computer out of your bedroom. ? Keep your bedroom cool, dark, and quiet. ? Use a supportive mattress and pillows.  Follow your health care provider's instructions for other changes to sleep habits. Nutrition  Do not eat heavy meals in the evening.  Do not have caffeine in the later part of the day. The effects of caffeine can last for more than 5 hours.  Follow your health care provider's or dietitian's instructions for any diet changes. Lifestyle      Do not drink alcohol before bedtime. Alcohol can cause you to fall asleep at first, but then it can cause you to wake up in the middle of the night and have trouble getting back to sleep.  Do not use any products that contain nicotine or tobacco, such as cigarettes and e-cigarettes. If you need help quitting, ask your health care  provider. Medicines  Take over-the-counter and prescription medicines only as told by your health care provider.  Do not use over-the-counter sleep medicine. You can become dependent on this medicine, and it can make sleep apnea worse.  Do not use medicines, such as sedatives and narcotics, unless told by your health care provider. Activity  Exercise on most days, but avoid exercising in the evening. Exercising near bedtime can interfere with sleeping.  If possible, spend time outside every day. Natural light helps regulate your circadian rhythm. General information  Lose weight if you need to, and maintain a healthy weight.  Keep all follow-up visits as told by your health care provider. This is important.  If you are having surgery, make sure to tell your health care provider that you have sleep apnea. You may need to bring your device with you. Where to find more information Learn more about sleep apnea and daytime fatigue from:  American Sleep Association: sleepassociation.[place]: sleepfoundation.org  National Heart, Lung, and Blood Institute: https://www.[place].biz/ Summary  Sleep apnea can cause daytime fatigue and other serious health conditions.  Both sleep apnea and daytime fatigue can be bad for your health and well-being.  You may need to wear a device while sleeping to help keep your airway open.  If you are having surgery, make sure to tell your health care provider that you have sleep apnea. You may need to bring your device with you.  Making changes to sleep habits, diet, lifestyle, and activity can help you manage sleep apnea. This information is not intended to replace advice given to you by your health care provider. Make sure you discuss any questions you have with your health care provider. Document Released: 06/20/2017 Document Revised: 07/18/2018 Document Reviewed: 06/20/2017 Elsevier Patient Education  [place].    CPAP and  BPAP Information CPAP and BPAP are methods of helping a person breathe with the use of air pressure. CPAP stands for "continuous positive airway pressure." BPAP stands for "bi-level positive airway pressure." In both methods, air is blown through your nose or mouth and into your air passages to help you breathe well. CPAP and BPAP use different amounts of  pressure to blow air. With CPAP, the amount of pressure stays the same while you breathe in and out. With BPAP, the amount of pressure is increased when you breathe in (inhale) so that you can take larger breaths. Your health care provider will recommend whether CPAP or BPAP would be more helpful for you. Why are CPAP and BPAP treatments used? CPAP or BPAP can be helpful if you have:  Sleep apnea.  Chronic obstructive pulmonary disease (COPD).  Heart failure.  Medical conditions that weaken the muscles of the chest including muscular dystrophy, or neurological diseases such as amyotrophic lateral sclerosis (ALS).  Other problems that cause breathing to be weak, abnormal, or difficult. CPAP is most commonly used for obstructive sleep apnea (OSA) to keep the airways from collapsing when the muscles relax during sleep. How is CPAP or BPAP administered? Both CPAP and BPAP are provided by a small machine with a flexible plastic tube that attaches to a plastic mask. You wear the mask. Air is blown through the mask into your nose or mouth. The amount of pressure that is used to blow the air can be adjusted on the machine. Your health care provider will determine the pressure setting that should be used based on your individual needs. When should CPAP or BPAP be used? In most cases, the mask only needs to be worn during sleep. Generally, the mask needs to be worn throughout the night and during any daytime naps. People with certain medical conditions may also need to wear the mask at other times when they are awake. Follow instructions from your health  care provider about when to use the machine. What are some tips for using the mask?   Because the mask needs to be snug, some people feel trapped or closed-in (claustrophobic) when first using the mask. If you feel this way, you may need to get used to the mask. One way to do this is by holding the mask loosely over your nose or mouth and then gradually applying the mask more snugly. You can also gradually increase the amount of time that you use the mask.  Masks are available in various types and sizes. Some fit over your mouth and nose while others fit over just your nose. If your mask does not fit well, talk with your health care provider about getting a different one.  If you are using a mask that fits over your nose and you tend to breathe through your mouth, a chin strap may be applied to help keep your mouth closed.  The CPAP and BPAP machines have alarms that may sound if the mask comes off or develops a leak.  If you have trouble with the mask, it is very important that you talk with your health care provider about finding a way to make the mask easier to tolerate. Do not stop using the mask. Stopping the use of the mask could have a negative impact on your health. What are some tips for using the machine?  Place your CPAP or BPAP machine on a secure table or stand near an electrical outlet.  Know where the on/off switch is located on the machine.  Follow instructions from your health care provider about how to set the pressure on your machine and when you should use it.  Do not eat or drink while the CPAP or BPAP machine is on. Food or fluids could get pushed into your lungs by the pressure of the CPAP or BPAP.  Do  not smoke. Tobacco smoke residue can damage the machine.  For home use, CPAP and BPAP machines can be rented or purchased through home health care companies. Many different brands of machines are available. Renting a machine before purchasing may help you find out which  particular machine works well for you.  Keep the CPAP or BPAP machine and attachments clean. Ask your health care provider for specific instructions. Get help right away if:  You have redness or open areas around your nose or mouth where the mask fits.  You have trouble using the CPAP or BPAP machine.  You cannot tolerate wearing the CPAP or BPAP mask.  You have pain, discomfort, and bloating in your abdomen. Summary  CPAP and BPAP are methods of helping a person breathe with the use of air pressure.  Both CPAP and BPAP are provided by a small machine with a flexible plastic tube that attaches to a plastic mask.  If you have trouble with the mask, it is very important that you talk with your health care provider about finding a way to make the mask easier to tolerate. This information is not intended to replace advice given to you by your health care provider. Make sure you discuss any questions you have with your health care provider. Document Released: 12/23/2003 Document Revised: 07/16/2018 Document Reviewed: 02/13/2016 Elsevier Patient Education  [place].    Gastroesophageal Reflux Disease, Adult Gastroesophageal reflux (GER) happens when acid from the stomach flows up into the tube that connects the mouth and the stomach (esophagus). Normally, food travels down the esophagus and stays in the stomach to be digested. With GER, food and stomach acid sometimes move back up into the esophagus. You may have a disease called gastroesophageal reflux disease (GERD) if the reflux:  Happens often.  Causes frequent or very bad symptoms.  Causes problems such as damage to the esophagus. When this happens, the esophagus becomes sore and swollen (inflamed). Over time, GERD can make small holes (ulcers) in the lining of the esophagus. What are the causes? This condition is caused by a problem with the muscle between the esophagus and the stomach. When this muscle is weak or not  normal, it does not close properly to keep food and acid from coming back up from the stomach. The muscle can be weak because of:  Tobacco use.  Pregnancy.  Having a certain type of hernia (hiatal hernia).  Alcohol use.  Certain foods and drinks, such as coffee, chocolate, onions, and peppermint. What increases the risk? You are more likely to develop this condition if you:  Are overweight.  Have a disease that affects your connective tissue.  Use NSAID medicines. What are the signs or symptoms? Symptoms of this condition include:  Heartburn.  Difficult or painful swallowing.  The feeling of having a lump in the throat.  A bitter taste in the mouth.  Bad breath.  Having a lot of saliva.  Having an upset or bloated stomach.  Belching.  Chest pain. Different conditions can cause chest pain. Make sure you see your doctor if you have chest pain.  Shortness of breath or noisy breathing (wheezing).  Ongoing (chronic) cough or a cough at night.  Wearing away of the surface of teeth (tooth enamel).  Weight loss. How is this treated? Treatment will depend on how bad your symptoms are. Your doctor may suggest:  Changes to your diet.  Medicine.  Surgery. Follow these instructions at home: Eating and drinking  Follow a diet as told by your doctor. You may need to avoid foods and drinks such as: ? Coffee and tea (with or without caffeine). ? Drinks that contain alcohol. ? Energy drinks and sports drinks. ? Bubbly (carbonated) drinks or sodas. ? Chocolate and cocoa. ? Peppermint and mint flavorings. ? Garlic and onions. ? Horseradish. ? Spicy and acidic foods. These include peppers, chili powder, curry powder, vinegar, hot sauces, and BBQ sauce. ? Citrus fruit juices and citrus fruits, such as oranges, lemons, and limes. ? Tomato-based foods. These include red sauce, chili, salsa, and pizza with red sauce. ? Fried and fatty foods. These include donuts, french  fries, potato chips, and high-fat dressings. ? High-fat meats. These include hot dogs, rib eye steak, sausage, ham, and bacon. ? High-fat dairy items, such as whole milk, butter, and cream cheese.  Eat small meals often. Avoid eating large meals.  Avoid drinking large amounts of liquid with your meals.  Avoid eating meals during the 2-3 hours before bedtime.  Avoid lying down right after you eat.  Do not exercise right after you eat. Lifestyle   Do not use any products that contain nicotine or tobacco. These include cigarettes, e-cigarettes, and chewing tobacco. If you need help quitting, ask your doctor.  Try to lower your stress. If you need help doing this, ask your doctor.  If you are overweight, lose an amount of weight that is healthy for you. Ask your doctor about a safe weight loss goal. General instructions  Pay attention to any changes in your symptoms.  Take over-the-counter and prescription medicines only as told by your doctor. Do not take aspirin, ibuprofen, or other NSAIDs unless your doctor says it is okay.  Wear loose clothes. Do not wear anything tight around your waist.  Raise (elevate) the head of your bed about 6 inches (15 cm).  Avoid bending over if this makes your symptoms worse.  Keep all follow-up visits as told by your doctor. This is important. Contact a doctor if:  You have new symptoms.  You lose weight and you do not know why.  You have trouble swallowing or it hurts to swallow.  You have wheezing or a cough that keeps happening.  Your symptoms do not get better with treatment.  You have a hoarse voice. Get help right away if:  You have pain in your arms, neck, jaw, teeth, or back.  You feel sweaty, dizzy, or light-headed.  You have chest pain or shortness of breath.  You throw up (vomit) and your throw-up looks like blood or coffee grounds.  You pass out (faint).  Your poop (stool) is bloody or black.  You cannot swallow,  drink, or eat. Summary  If a person has gastroesophageal reflux disease (GERD), food and stomach acid move back up into the esophagus and cause symptoms or problems such as damage to the esophagus.  Treatment will depend on how bad your symptoms are.  Follow a diet as told by your doctor.  Take all medicines only as told by your doctor. This information is not intended to replace advice given to you by your health care provider. Make sure you discuss any questions you have with your health care provider. Document Released: 09/12/2007 Document Revised: 10/02/2017 Document Reviewed: 10/02/2017 Elsevier Patient Education  2020 [place] for Gastroesophageal Reflux Disease, Adult When you have gastroesophageal reflux disease (GERD), the foods you eat and your eating habits are very important. Choosing the  right foods can help ease your discomfort. Think about working with a nutrition specialist (dietitian) to help you make good choices. What are tips for following this plan?  Meals  Choose healthy foods that are low in fat, such as fruits, vegetables, whole grains, low-fat dairy products, and lean meat, fish, and poultry.  Eat small meals often instead of 3 large meals a day. Eat your meals slowly, and in a place where you are relaxed. Avoid bending over or lying down until 2-3 hours after eating.  Avoid eating meals 2-3 hours before bed.  Avoid drinking a lot of liquid with meals.  Cook foods using methods other than frying. Bake, grill, or broil food instead.  Avoid or limit: ? Chocolate. ? Peppermint or spearmint. ? Alcohol. ? Pepper. ? Black and decaffeinated coffee. ? Black and decaffeinated tea. ? Bubbly (carbonated) soft drinks. ? Caffeinated energy drinks and soft drinks.  Limit high-fat foods such as: ? Fatty meat or fried foods. ? Whole milk, cream, butter, or ice cream. ? Nuts and nut butters. ? Pastries, donuts, and sweets made with butter or  shortening.  Avoid foods that cause symptoms. These foods may be different for everyone. Common foods that cause symptoms include: ? Tomatoes. ? Oranges, lemons, and limes. ? Peppers. ? Spicy food. ? Onions and garlic. ? Vinegar. Lifestyle  Maintain a healthy weight. Ask your doctor what weight is healthy for you. If you need to lose weight, work with your doctor to do so safely.  Exercise for at least 30 minutes for 5 or more days each week, or as told by your doctor.  Wear loose-fitting clothes.  Do not smoke. If you need help quitting, ask your doctor.  Sleep with the head of your bed higher than your feet. Use a wedge under the mattress or blocks under the bed frame to raise the head of the bed. Summary  When you have gastroesophageal reflux disease (GERD), food and lifestyle choices are very important in easing your symptoms.  Eat small meals often instead of 3 large meals a day. Eat your meals slowly, and in a place where you are relaxed.  Limit high-fat foods such as fatty meat or fried foods.  Avoid bending over or lying down until 2-3 hours after eating.  Avoid peppermint and spearmint, caffeine, alcohol, and chocolate. This information is not intended to replace advice given to you by your health care provider. Make sure you discuss any questions you have with your health care provider. Document Released: 09/25/2011 Document Revised: 07/17/2018 Document Reviewed: 05/01/2016 Elsevier Patient Education  2020 [employer].

## 2018-12-24 NOTE — Progress Notes (Signed)
HPI:  Chief Complaint  Patient presents with  . White bumps in mouth    Brianna Foster is a 63 y.o. female, who is here today with above concern. For a while she has had intermittent sore throat. A few days ago she noted whitish lesions on tonsils,no associated sore throat,fever,chills, stridor,cough,wheezing,or dyspnea. She has done gargles.  She is also concerned about intermittent LE edema that seems to be more frequent lately. Exacerbated by prolonged standing and walking. Alleviated by LE elevation. It is not present in the morning.  She has not noted LE erythema or pain. She is on Amlodipine 5 mg daily and Losartan-HCTZ 50-12.5 mg daily. Denies severe/frequent headache, visual changes, chest pain, dyspnea, palpitation, claudication,or focal weakness. Negative for PND or orthopnea. She was recently dx with severe OSA, pending sleep study with CPAP.   Lab Results  Component Value Date   TSH 1.55 01/05/2015   Also c/o "toenail infection." For years she has had problem with toenails,falling and growing back with changes in texture. A few weeks of right great toenail loose toenail and drainage. Mild pain. No Hx of trauma.  She has not tried OTC medications.   Review of Systems  Constitutional: Positive for fatigue. Negative for activity change, appetite change, fever and unexpected weight change.  HENT: Negative for mouth sores, nosebleeds and trouble swallowing.   Eyes: Negative for redness and visual disturbance.  Gastrointestinal: Negative for abdominal pain, nausea and vomiting.       Negative for changes in bowel habits.  Endocrine: Negative for cold intolerance and heat intolerance.  Genitourinary: Negative for decreased urine volume and hematuria.  Musculoskeletal: Negative for gait problem and myalgias.  Allergic/Immunologic: Positive for environmental allergies.  Neurological: Negative for syncope and facial asymmetry.  Psychiatric/Behavioral:  The patient is nervous/anxious.   Rest see pertinent positives and negatives per HPI.   Current Outpatient Medications on File Prior to Visit  Medication Sig Dispense Refill  . acetaminophen (TYLENOL) 500 MG tablet Take 500 mg by mouth every 6 (six) hours as needed for mild pain.    [name] albuterol (PROVENTIL HFA;VENTOLIN HFA) 108 (90 Base) MCG/ACT inhaler Inhale 2 puffs into the lungs every 6 (six) hours as needed for wheezing or shortness of breath. 1 Inhaler 0  . amLODipine (NORVASC) 5 MG tablet Take 0.5 tablets (2.5 mg total) by mouth daily. 90 tablet 2  . atorvastatin (LIPITOR) 20 MG tablet Take 1 tablet (20 mg total) by mouth daily. 90 tablet 1  . Blood Pressure Monitoring (BLOOD PRESSURE MONITOR AUTOMAT) DEVI 1 Device by Does not apply route daily. 1 Device 0  . famotidine (PEPCID) 40 MG tablet Take 1 tablet (40 mg total) by mouth at bedtime. 90 tablet 0  . fluticasone (FLONASE) 50 MCG/ACT nasal spray Place 1 spray into both nostrils 2 (two) times daily as needed for allergies or rhinitis. 16 g 5  . losartan-hydrochlorothiazide (HYZAAR) 50-12.5 MG tablet Take 1 tablet by mouth daily. 90 tablet 0  . meclizine (ANTIVERT) 25 MG tablet Take 1 tablet (25 mg total) by mouth 3 (three) times daily as needed for dizziness. 30 tablet 2  . metoprolol tartrate (LOPRESSOR) 25 MG tablet Take 0.5 tablets (12.5 mg total) by mouth 2 (two) times daily. 60 tablet 3  . triamcinolone cream (KENALOG) 0.1 % Apply 1 application topically 2 (two) times daily as needed. (Patient taking differently: Apply 1 application topically 2 (two) times daily as needed (to affected sites- for itching). )  45 g 1  . valACYclovir (VALTREX) 500 MG tablet 1 TAB TWICE DAILY FOR 3 DAYS WITH OUTBREAKS, START WITHIN 48 HOURS AFTER ONSET. (Patient taking differently: Take 500 mg by mouth 2 (two) times daily. FOR 3 DAYS WITH OUTBREAKS, START WITHIN 48 HOURS AFTER ONSET.) 18 tablet 1   No current facility-administered medications on file prior  to visit.      Past Medical History:  Diagnosis Date  . GERD (gastroesophageal reflux disease)   . Headache(784.0)   . Hypertension   . OSA (obstructive sleep apnea) 08/28/2017   Allergies  Allergen Reactions  . Other Anaphylaxis, Shortness Of Breath and Swelling    [place] NUTS    Social History   Socioeconomic History  . Marital status: Divorced    Spouse name: Not on file  . Number of children: 1  . Years of education: College  . Highest education level: Not on file  Occupational History    Comment: [employer]  Social Needs  . Financial resource strain: Not on file  . Food insecurity    Worry: Not on file    Inability: Not on file  . Transportation needs    Medical: Not on file    Non-medical: Not on file  Tobacco Use  . Smoking status: Former Smoker    Packs/day: 0.25    Years: 16.00    Pack years: 4.00    Types: Cigarettes    Quit date: 03/09/2018    Years since quitting: 0.8  . Smokeless tobacco: Never Used  Substance and Sexual Activity  . Alcohol use: No    Comment: Occassional Use  . Drug use: No  . Sexual activity: Not Currently    Birth control/protection: Post-menopausal  Lifestyle  . Physical activity    Days per week: Not on file    Minutes per session: Not on file  . Stress: Not on file  Relationships  . Social Herbalist on phone: Not on file    Gets together: Not on file    Attends religious service: Not on file    Active member of club or organization: Not on file    Attends meetings of clubs or organizations: Not on file    Relationship status: Not on file  Other Topics Concern  . Not on file  Social History Narrative   Lives at home alone   Right-handed   Drinks decaf coffee    Vitals:   12/24/18 0734  BP: 130/86  Pulse: 74  Resp: 12  Temp: 98 F (36.7 C)  SpO2: 98%   Body mass index is 38.01 kg/m.  Physical Exam  Nursing note and vitals reviewed. Constitutional: She is oriented to person, place,  and time. She appears well-developed. No distress.  HENT:  Head: Normocephalic and atraumatic.  Mouth/Throat: Uvula is midline, oropharynx is clear and moist and mucous membranes are normal.  Eyes: Pupils are equal, round, and reactive to light. Conjunctivae are normal.  Cardiovascular: Normal rate and regular rhythm.  No murmur heard. Pulses:      Dorsalis pedis pulses are 2+ on the right side and 2+ on the left side.  Varicose veins LE,bilateral.  Respiratory: Effort normal and breath sounds normal. No respiratory distress.  GI: Soft. She exhibits no mass. There is no hepatomegaly. There is no abdominal tenderness.  Musculoskeletal:        General: Edema (1+ pitting LE edema,bilateral.) present.  Lymphadenopathy:    She has no cervical adenopathy.  Neurological: She is alert and oriented to person, place, and time. She has normal strength. No cranial nerve deficit. Gait normal.  Skin: Skin is warm. No rash noted. No erythema.  Right great toenail with nail polish ,so I cannot evaluate for toenail abnormalities. Noted that distal aspect of toenail with detached from bed nail and upon pressure there is yellowish drainage. No periungual edema or erythema.  Psychiatric: Her mood appears anxious.  Well groomed, good eye contact.    ASSESSMENT AND PLAN:   Brianna Foster was seen today for white bumps in mouth.  Diagnoses and all orders for this visit:  Lab Results  Component Value Date   CREATININE 0.75 12/24/2018   BUN 16 12/24/2018   NA 140 12/24/2018   K 4.3 12/24/2018   CL 104 12/24/2018   CO2 28 12/24/2018   Lab Results  Component Value Date   TSH 2.03 12/24/2018    Tonsil stone Today exam negative. I have seen what looks like tonsil stone during prior video visit. Problem is recurrent ,so ENT referral placed. Educated about problem,possible trigger factors to avoid and treatment options.  -     Ambulatory referral to ENT  Bilateral lower extremity edema Educated  about possible causes. ? Medications and vain disease. Hx and examination today do not suggest a serious process. Compression stocking and LE elevation. HCTZ dose increased from 12.5 mg  To 75 mg daily (HCTZ 25 mg added). Instructed about warning signs.  -     hydrochlorothiazide (HYDRODIURIL) 25 MG tablet; Take 1 tablet (25 mg total) by mouth daily. -     TSH  Essential hypertension DBP mildly elevated. HCTZ 25 mg added today. No changes in rest of medications. Low salt diet to continue. Recommend monitoring BP regularly. F/U in 3 months.  She is following with pulmonologist today for OSA.  -     Basic metabolic panel -     hydrochlorothiazide (HYDRODIURIL) 25 MG tablet; Take 1 tablet (25 mg total) by mouth daily.  Paronychia of great toe of right foot Abx treatment recommended. Soaking foot in warm water with Epson salt. She would like toenail removed,possible complications discussed. Podiatric evaluation recommended.  -     cephALEXin (KEFLEX) 500 MG capsule; Take 1 capsule (500 mg total) by mouth 3 (three) times daily for 7 days.    Return in about 3 months (around 03/25/2019).   -Brianna Foster was advised to seek immediate medical attention if sudden worsening symptoms or to follow if they persist or if new concerns arise.       Bronwyn Belasco G. Martinique, MD  [facility]. [place] office.

## 2018-12-24 NOTE — Assessment & Plan Note (Signed)
Plan: We will start CPAP therapy today APAP setting 5-15 Patient needs 43-month follow-up with our office  Patient to proceed forward with a ENT referral I would prefer the patient establish with Dr. Redmond Baseman at [facility] as patient has interest as well as potential questions regarding the inspire device which she may be a candidate for if she loses weight as well as failed CPAP therapy.

## 2018-12-24 NOTE — Progress Notes (Signed)
Reviewed and agree with assessment/plan.   Kegan Mckeithan, MD [facility] 04/04/2016, 12:24 PM Pager:  [phone]  

## 2018-12-24 NOTE — Patient Instructions (Signed)
A few things to remember from today's visit:   Tonsil stone - Plan: Ambulatory referral to ENT  Bilateral lower extremity edema - Plan: hydrochlorothiazide (HYDRODIURIL) 25 MG tablet, TSH  Essential hypertension - Plan: Basic metabolic panel, hydrochlorothiazide (HYDRODIURIL) 25 MG tablet  Paronychia of great toe of right foot - Plan: cephALEXin (KEFLEX) 500 MG capsule  Paronychia Paronychia is an infection of the skin. It happens near a fingernail or toenail. It may cause pain and swelling around the nail. In some cases, a fluid-filled bump (abscess) can form near or under the nail. Usually, this condition is not serious, and it clears up with treatment. Follow these instructions at home: Wound care  Keep the affected area clean.  Soak the fingers or toes in warm water as told by your doctor. You may be told to do this for 20 minutes, 2-3 times a day.  Keep the area dry when you are not soaking it.  Do not try to drain a fluid-filled bump on your own.  Follow instructions from your doctor about how to take care of the affected area. Make sure you: ? Wash your hands with soap and water before you change your bandage (dressing). If you cannot use soap and water, use hand sanitizer. ? Change your bandage as told by your doctor.  If you had a fluid-filled bump and your doctor drained it, check the area every day for signs of infection. Check for: ? Redness, swelling, or pain. ? Fluid or blood. ? Warmth. ? Pus or a bad smell. Medicines   Take over-the-counter and prescription medicines only as told by your doctor.  If you were prescribed an antibiotic medicine, take it as told by your doctor. Do not stop taking it even if you start to feel better. General instructions  Avoid touching any chemicals.  Do not pick at the affected area. Prevention  To prevent this condition from happening again: ? Wear rubber gloves when putting your hands in water for washing dishes or other  tasks. ? Wear gloves if your hands might touch cleaners or chemicals. ? Avoid injuring your nails or fingertips. ? Do not bite your nails or tear hangnails. ? Do not cut your nails very short. ? Do not cut the skin at the base and sides of the nail (cuticles). ? Use clean nail clippers or scissors when trimming nails. Contact a doctor if:  You feel worse.  You do not get better.  You have more fluid, blood, or pus coming from the affected area.  Your finger or knuckle is swollen or is hard to move. Get help right away if you have:  A fever or chills.  Redness spreading from the affected area.  Pain in a joint or muscle. Summary  Paronychia is an infection of the skin. It happens near a fingernail or toenail.  This condition may cause pain and swelling around the nail.  Soak the fingers or toes in warm water as told by your doctor.  Usually, this condition is not serious, and it clears up with treatment. This information is not intended to replace advice given to you by your health care provider. Make sure you discuss any questions you have with your health care provider. Document Released: 03/14/2009 Document Revised: 04/12/2017 Document Reviewed: 04/08/2017 Elsevier Patient Education  2020 [employer].   Please be sure medication list is accurate. If a new problem present, please set up appointment sooner than planned today.

## 2018-12-24 NOTE — Assessment & Plan Note (Signed)
Patient declines flu vaccine today.

## 2018-12-24 NOTE — Assessment & Plan Note (Signed)
Plan: Continue Protonix Continue Pepcid Proceed forward with GI referral on 12/25/2018 Review AVS material on acid reflux diet

## 2018-12-25 ENCOUNTER — Ambulatory Visit (INDEPENDENT_AMBULATORY_CARE_PROVIDER_SITE_OTHER): Payer: 59 | Admitting: Gastroenterology

## 2018-12-25 ENCOUNTER — Encounter: Payer: Self-pay | Admitting: Gastroenterology

## 2018-12-25 VITALS — BP 122/80 | HR 84 | Temp 98.4°F | Ht 68.5 in | Wt 249.5 lb

## 2018-12-25 DIAGNOSIS — R0789 Other chest pain: Secondary | ICD-10-CM | POA: Diagnosis not present

## 2018-12-25 DIAGNOSIS — K219 Gastro-esophageal reflux disease without esophagitis: Secondary | ICD-10-CM | POA: Diagnosis not present

## 2018-12-25 MED ORDER — PANTOPRAZOLE SODIUM 40 MG PO TBEC: 40.0000 mg | DELAYED_RELEASE_TABLET | Freq: Two times a day (BID) | ORAL | 5 refills | Status: DC

## 2018-12-25 MED ORDER — SUCRALFATE 1 GM/10ML PO SUSP: 1.0000 g | Freq: Four times a day (QID) | ORAL | 3 refills | Status: DC | PRN

## 2018-12-25 NOTE — Progress Notes (Signed)
HPI :  63 year old female with a history of GERD, hypertension, OSA, referred here by Betty Martinique, MD for atypical chest pain.  I previously know this patient from colonoscopy performed in January 2017, have not seen her since that time.  She reports she has had ongoing intermittent chest discomfort for some time.  This is brought her to the emergency room on multiple occasions for which she has been ruled out for MI.  She had a negative cardiac cath in April 2019.  She states she has discomfort in her mid chest that occurs, feels like a pressure.  She denies any associated pyrosis or regurgitation.  She denies any dysphasia.  No nausea, no vomiting.  She denies any associated pain with this.  This will occur usually after she eats something.  She denies any nocturnal symptoms.  She states that of a 5-day stretch, she will have symptoms about 3 of those days.  She states when in the emergency department in the past when given a GI cocktail that helped relieved her symptoms on multiple occasions.  She has been maintained on Protonix 40 mg once a day which has not provided much benefit.  She was on Nexium remotely back in 2002 which worked okay for her, although the symptom seems different.  She has since resumed Nexium over-the-counter to try it but has not provided any help.  She has had an EGD remotely several years ago, unclear where this was done, no report available.  She denies any shortness of breath or chest pain otherwise.  She denies any exertional component to her symptoms.  She reports she has been evaluated by cardiology and told she does not have cardiac etiology for her symptoms.  Of note she was recently diagnosed with obstructive sleep apnea and is pending to be fitted for a CPAP device.  He otherwise does endorse some weight gain over time.  Her colonoscopy with me showed multiple small polyps, 2 of them were adenomatous.  Report as below.  Cardiac cath 07/11/2017 - normal  Colonoscopy  04/27/2015 - A 40mm sessile polyp was noted in the hepatic flexure and removed via cold snare. A 75mm sessile polyp was noted in the splenic flexure and removed via cold snare. A 17mm sessile polyp was noted in the descending colon and removed via cold snare. Four x 41mm sessile polyps were noted in the rectum and 3 removed via cold snare, one removed via cold forceps given the snare could not grasp it. The remainder of the examined colon was normal. The ileum was normal. Retroflexed views revealed internal hemorrhoids. Path shows a few adenomas -    Past Medical History:  Diagnosis Date  . GERD (gastroesophageal reflux disease)   . Headache(784.0)   . Hypertension   . OSA (obstructive sleep apnea) 08/28/2017     Past Surgical History:  Procedure Laterality Date  . LEFT HEART CATH AND CORONARY ANGIOGRAPHY N/A 07/11/2017   Procedure: LEFT HEART CATH AND CORONARY ANGIOGRAPHY;  Surgeon: Martinique, Peter M, MD;  Location: [facility];  Service: Cardiovascular;  Laterality: N/A;  . LEFT HEART CATHETERIZATION WITH CORONARY ANGIOGRAM N/A 05/11/2013   Procedure: LEFT HEART CATHETERIZATION WITH CORONARY ANGIOGRAM;  Surgeon: Burnell Blanks, MD;  Location: [facility];  Service: Cardiovascular;  Laterality: N/A;  . neck fusion     Family History  Problem Relation Age of Onset  . Lung cancer Mother   . Stomach cancer Father   . Heart disease Brother  Pacemaker  . Esophageal cancer Brother   . Breast cancer Sister    Social History   Tobacco Use  . Smoking status: Former Smoker    Packs/day: 0.25    Years: 16.00    Pack years: 4.00    Types: Cigarettes    Quit date: 03/09/2018    Years since quitting: 0.7  . Smokeless tobacco: Never Used  Substance Use Topics  . Alcohol use: No    Comment: Occassional Use  . Drug use: No   Current Outpatient Medications  Medication Sig Dispense Refill  . acetaminophen (TYLENOL) 500 MG tablet Take 500 mg by mouth every 6 (six) hours as  needed for mild pain.    [name] albuterol (PROVENTIL HFA;VENTOLIN HFA) 108 (90 Base) MCG/ACT inhaler Inhale 2 puffs into the lungs every 6 (six) hours as needed for wheezing or shortness of breath. 1 Inhaler 0  . amLODipine (NORVASC) 5 MG tablet Take 0.5 tablets (2.5 mg total) by mouth daily. 90 tablet 2  . atorvastatin (LIPITOR) 20 MG tablet Take 1 tablet (20 mg total) by mouth daily. 90 tablet 1  . Blood Pressure Monitoring (BLOOD PRESSURE MONITOR AUTOMAT) DEVI 1 Device by Does not apply route daily. 1 Device 0  . cephALEXin (KEFLEX) 500 MG capsule Take 1 capsule (500 mg total) by mouth 3 (three) times daily for 7 days. 21 capsule 0  . famotidine (PEPCID) 40 MG tablet Take 1 tablet (40 mg total) by mouth at bedtime. 90 tablet 0  . fluticasone (FLONASE) 50 MCG/ACT nasal spray Place 1 spray into both nostrils 2 (two) times daily as needed for allergies or rhinitis. 16 g 5  . hydrochlorothiazide (HYDRODIURIL) 25 MG tablet Take 1 tablet (25 mg total) by mouth daily. 90 tablet 0  . losartan-hydrochlorothiazide (HYZAAR) 50-12.5 MG tablet Take 1 tablet by mouth daily. 90 tablet 0  . meclizine (ANTIVERT) 25 MG tablet Take 1 tablet (25 mg total) by mouth 3 (three) times daily as needed for dizziness. 30 tablet 2  . metoprolol tartrate (LOPRESSOR) 25 MG tablet Take 0.5 tablets (12.5 mg total) by mouth 2 (two) times daily. 60 tablet 3  . pantoprazole (PROTONIX) 40 MG tablet Take 1 tablet (40 mg total) by mouth daily. 90 tablet 2  . pantoprazole (PROTONIX) 40 MG tablet TAKE 1 TABLET BY MOUTH EVERY DAY 30 tablet 2  . triamcinolone cream (KENALOG) 0.1 % Apply 1 application topically 2 (two) times daily as needed. (Patient taking differently: Apply 1 application topically 2 (two) times daily as needed (to affected sites- for itching). ) 45 g 1  . valACYclovir (VALTREX) 500 MG tablet 1 TAB TWICE DAILY FOR 3 DAYS WITH OUTBREAKS, START WITHIN 48 HOURS AFTER ONSET. (Patient taking differently: Take 500 mg by mouth 2 (two)  times daily. FOR 3 DAYS WITH OUTBREAKS, START WITHIN 48 HOURS AFTER ONSET.) 18 tablet 1   No current facility-administered medications for this visit.    Allergies  Allergen Reactions  . Other Anaphylaxis, Shortness Of Breath and Swelling    [place] NUTS     Review of Systems: All systems reviewed and negative except where noted in HPI.   Lab Results  Component Value Date   WBC 14.5 (H) 08/25/2018   HGB 12.7 08/25/2018   HCT 38.1 08/25/2018   MCV 91.1 08/25/2018   PLT 301 08/25/2018    Lab Results  Component Value Date   ALT 25 07/11/2017   AST 27 07/11/2017   ALKPHOS 73 07/11/2017   BILITOT  0.5 07/11/2017    Lab Results  Component Value Date   CREATININE 0.75 12/24/2018   BUN 16 12/24/2018   NA 140 12/24/2018   K 4.3 12/24/2018   CL 104 12/24/2018   CO2 28 12/24/2018     Physical Exam: BP 122/80 (BP Location: Left Arm, Patient Position: Sitting, Cuff Size: Large)   Pulse 84   Temp 98.4 F (36.9 C) (Oral)   Ht 5' 8.5" (1.74 m)   Wt 249 lb 8 oz (113.2 kg)   LMP 01/07/2010 (LMP Unknown)   BMI 37.38 kg/m  Constitutional: Pleasant,well-developed, female in no acute distress. HEENT: Normocephalic and atraumatic. Conjunctivae are normal. No scleral icterus. Neck supple.  Cardiovascular: Normal rate, regular rhythm.  Pulmonary/chest: Effort normal and breath sounds normal. No wheezing, rales or rhonchi. Abdominal: Soft, nondistended, protuberant, nontender. There are no masses palpable. No hepatomegaly. Extremities: no edema Lymphadenopathy: No cervical adenopathy noted. Neurological: Alert and oriented to person place and time. Skin: Skin is warm and dry. No rashes noted. Psychiatric: Normal mood and affect. Behavior is normal.   ASSESSMENT AND PLAN: 63 year old female here to reestablish care for the following:  Atypical chest pain / GERD - as above, longstanding intermittent chest pressure, often relieved with GI cocktail, negative cardiac evaluation,  symptoms seem to be related to eating.  Protonix has not provided much benefit yet in controlling these episodes.  Discussed differential diagnosis with her. GERD is certainly possible although she does not have other more typical symptoms of associated reflux.  Esophageal spasm also possible, EoE, etc.  I am recommending an EGD to further evaluate this issue given her ongoing symptoms despite moderate dose PPI.  I discussed risk and benefits of EGD and anesthesia with her and she wanted to proceed.  In the interim recommend she increase her Protonix to 40 mg twice a day, take 30 to 60 minutes prior to eating, as a trial to see if she responds better to high-dose acid suppression.  I also will give her some liquid Carafate to use every 6 hours as needed to see if that helps.  She does have recently diagnosed obstructive sleep apnea, perhaps CPAP device will help reduce her reflux symptoms.  I also counseled her that weight loss in general can help reduce reflux and potentially could improve her symptoms as well.  She agreed with the plan, further recommendations pending the result.  Satanta Cellar, MD [facility]  CC: Martinique, Betty G, MD

## 2018-12-25 NOTE — Patient Instructions (Signed)
If you are age 62 or older, your body mass index should be between 23-30. Your Body mass index is 37.38 kg/m. If this is out of the aforementioned range listed, please consider follow up with your Primary Care Provider.  If you are age 32 or younger, your body mass index should be between 19-25. Your Body mass index is 37.38 kg/m. If this is out of the aformentioned range listed, please consider follow up with your Primary Care Provider.   To help prevent the possible spread of infection to our patients, communities, and staff; we will be implementing the following measures:  As of now we are not allowing any visitors/family members to accompany you to any upcoming appointments with [facility]. If you have any concerns about this please contact our office to discuss prior to the appointment.   You have been scheduled for an endoscopy. Please follow written instructions given to you at your visit today. If you use inhalers (even only as needed), please bring them with you on the day of your procedure. Your physician has requested that you go to www.startemmi.com and enter the access code given to you at your visit today. This web site gives a general overview about your procedure. However, you should still follow specific instructions given to you by our office regarding your preparation for the procedure.  We have sent the following medications to your pharmacy for you to pick up at your convenience: Protonix 40mg : Take twice a day, 30 to 60 minutes before a meal  Carafate Suspension: Take 36ml every 6 hours as needed  Thank you for entrusting me with your care and for choosing [employer], Dr. Shelocta Cellar

## 2018-12-28 ENCOUNTER — Encounter: Payer: Self-pay | Admitting: Family Medicine

## 2019-01-09 ENCOUNTER — Encounter: Payer: Self-pay | Admitting: Gastroenterology

## 2019-01-16 ENCOUNTER — Encounter: Payer: 59 | Admitting: Gastroenterology

## 2019-02-09 ENCOUNTER — Encounter: Payer: Self-pay | Admitting: Family Medicine

## 2019-02-09 ENCOUNTER — Other Ambulatory Visit (INDEPENDENT_AMBULATORY_CARE_PROVIDER_SITE_OTHER): Payer: 59

## 2019-02-09 ENCOUNTER — Telehealth (INDEPENDENT_AMBULATORY_CARE_PROVIDER_SITE_OTHER): Payer: 59 | Admitting: Family Medicine

## 2019-02-09 ENCOUNTER — Other Ambulatory Visit: Payer: Self-pay

## 2019-02-09 DIAGNOSIS — R102 Pelvic and perineal pain: Secondary | ICD-10-CM

## 2019-02-09 DIAGNOSIS — R300 Dysuria: Secondary | ICD-10-CM | POA: Diagnosis not present

## 2019-02-09 DIAGNOSIS — R350 Frequency of micturition: Secondary | ICD-10-CM

## 2019-02-09 DIAGNOSIS — N390 Urinary tract infection, site not specified: Secondary | ICD-10-CM | POA: Diagnosis not present

## 2019-02-09 LAB — POCT URINALYSIS DIPSTICK
Glucose, UA: NEGATIVE
Leukocytes, UA: NEGATIVE
Protein, UA: POSITIVE — AB
Spec Grav, UA: >=1.03 — AB (ref 1.010–1.025)
Urobilinogen, UA: 0.2 E.U./dL
pH, UA: 6 (ref 5.0–8.0)

## 2019-02-09 MED ORDER — NITROFURANTOIN MONOHYD MACRO 100 MG PO CAPS: 100.0000 mg | ORAL_CAPSULE | Freq: Two times a day (BID) | ORAL | 0 refills | Status: AC

## 2019-02-09 NOTE — Progress Notes (Signed)
Virtual Visit via Video Note   I connected with Brianna Foster on 02/09/19 by a video enabled telemedicine application and verified that I am speaking with the correct person using two identifiers.  Location patient: home Location provider:work office Persons participating in the virtual visit: patient, provider  I discussed the limitations of evaluation and management by telemedicine and the availability of in person appointments. The patient expressed understanding and agreed to proceed.   HPI: Brianna Foster is a 63 yo female c/o 2-3 days of constant suprapubic pain.  Cramps-like pain, 8/10, no radiated.  Exacerbated by walking. Alleviated by bending over and rest. No Hx of similar pain.  + Urgency and frequency. Denies dysuria,gross hematuria,or decreased urine output.  Negative for fever,chills,unusual fatigue,body aches.changes in bowel habits,N/V,vaginal bleeding,vaginal discharge,skin rash,or edema.  Last bowel movement was yesterday. No blood in stool.  Problem is stable. UCx 08/25/18 grw E. Coli 60,000 CFU.  She is not sexually active.   ROS: See pertinent positives and negatives per HPI.  Past Medical History:  Diagnosis Date  . GERD (gastroesophageal reflux disease)   . Headache(784.0)   . Hypertension   . OSA (obstructive sleep apnea) 08/28/2017    Past Surgical History:  Procedure Laterality Date  . LEFT HEART CATH AND CORONARY ANGIOGRAPHY N/A 07/11/2017   Procedure: LEFT HEART CATH AND CORONARY ANGIOGRAPHY;  Surgeon: Martinique, Peter M, MD;  Location: [place];  Service: Cardiovascular;  Laterality: N/A;  . LEFT HEART CATHETERIZATION WITH CORONARY ANGIOGRAM N/A 05/11/2013   Procedure: LEFT HEART CATHETERIZATION WITH CORONARY ANGIOGRAM;  Surgeon: Burnell Blanks, MD;  Location: [facility];  Service: Cardiovascular;  Laterality: N/A;  . neck fusion      Family History  Problem Relation Age of Onset  . Lung cancer Mother   . Stomach cancer Father    . Heart disease Brother        Pacemaker  . Esophageal cancer Brother   . Breast cancer Sister     Social History   Socioeconomic History  . Marital status: Divorced    Spouse name: Not on file  . Number of children: 1  . Years of education: College  . Highest education level: Not on file  Occupational History    Comment: [employer]  Social Needs  . Financial resource strain: Not on file  . Food insecurity    Worry: Not on file    Inability: Not on file  . Transportation needs    Medical: Not on file    Non-medical: Not on file  Tobacco Use  . Smoking status: Former Smoker    Packs/day: 0.25    Years: 16.00    Pack years: 4.00    Types: Cigarettes    Quit date: 03/09/2018    Years since quitting: 0.9  . Smokeless tobacco: Never Used  Substance and Sexual Activity  . Alcohol use: No    Comment: Occassional Use  . Drug use: No  . Sexual activity: Not Currently    Birth control/protection: Post-menopausal  Lifestyle  . Physical activity    Days per week: Not on file    Minutes per session: Not on file  . Stress: Not on file  Relationships  . Social Herbalist on phone: Not on file    Gets together: Not on file    Attends religious service: Not on file    Active member of club or organization: Not on file    Attends meetings of  clubs or organizations: Not on file    Relationship status: Not on file  . Intimate partner violence    Fear of current or ex partner: Not on file    Emotionally abused: Not on file    Physically abused: Not on file    Forced sexual activity: Not on file  Other Topics Concern  . Not on file  Social History Narrative   Lives at home alone   Right-handed   Drinks decaf coffee    Current Outpatient Medications:  .  acetaminophen (TYLENOL) 500 MG tablet, Take 500 mg by mouth every 6 (six) hours as needed for mild pain., Disp: , Rfl:  .  albuterol (PROVENTIL HFA;VENTOLIN HFA) 108 (90 Base) MCG/ACT inhaler, Inhale 2  puffs into the lungs every 6 (six) hours as needed for wheezing or shortness of breath., Disp: 1 Inhaler, Rfl: 0 .  amLODipine (NORVASC) 5 MG tablet, Take 0.5 tablets (2.5 mg total) by mouth daily., Disp: 90 tablet, Rfl: 2 .  atorvastatin (LIPITOR) 20 MG tablet, Take 1 tablet (20 mg total) by mouth daily., Disp: 90 tablet, Rfl: 1 .  Blood Pressure Monitoring (BLOOD PRESSURE MONITOR AUTOMAT) DEVI, 1 Device by Does not apply route daily., Disp: 1 Device, Rfl: 0 .  famotidine (PEPCID) 40 MG tablet, Take 1 tablet (40 mg total) by mouth at bedtime., Disp: 90 tablet, Rfl: 0 .  fluticasone (FLONASE) 50 MCG/ACT nasal spray, Place 1 spray into both nostrils 2 (two) times daily as needed for allergies or rhinitis., Disp: 16 g, Rfl: 5 .  hydrochlorothiazide (HYDRODIURIL) 25 MG tablet, Take 1 tablet (25 mg total) by mouth daily., Disp: 90 tablet, Rfl: 0 .  losartan-hydrochlorothiazide (HYZAAR) 50-12.5 MG tablet, Take 1 tablet by mouth daily., Disp: 90 tablet, Rfl: 0 .  meclizine (ANTIVERT) 25 MG tablet, Take 1 tablet (25 mg total) by mouth 3 (three) times daily as needed for dizziness., Disp: 30 tablet, Rfl: 2 .  metoprolol tartrate (LOPRESSOR) 25 MG tablet, Take 0.5 tablets (12.5 mg total) by mouth 2 (two) times daily., Disp: 60 tablet, Rfl: 3 .  nitrofurantoin, macrocrystal-monohydrate, (MACROBID) 100 MG capsule, Take 1 capsule (100 mg total) by mouth 2 (two) times daily for 5 days., Disp: 10 capsule, Rfl: 0 .  pantoprazole (PROTONIX) 40 MG tablet, Take 1 tablet (40 mg total) by mouth 2 (two) times daily. Take 30 to 60 minutes before a meal, Disp: 60 tablet, Rfl: 5 .  sucralfate (CARAFATE) 1 GM/10ML suspension, Take 10 mLs (1 g total) by mouth every 6 (six) hours as needed., Disp: 420 mL, Rfl: 3 .  triamcinolone cream (KENALOG) 0.1 %, Apply 1 application topically 2 (two) times daily as needed. (Patient taking differently: Apply 1 application topically 2 (two) times daily as needed (to affected sites- for  itching). ), Disp: 45 g, Rfl: 1 .  valACYclovir (VALTREX) 500 MG tablet, 1 TAB TWICE DAILY FOR 3 DAYS WITH OUTBREAKS, START WITHIN 48 HOURS AFTER ONSET. (Patient taking differently: Take 500 mg by mouth 2 (two) times daily. FOR 3 DAYS WITH OUTBREAKS, START WITHIN 48 HOURS AFTER ONSET.), Disp: 18 tablet, Rfl: 1  EXAM:  VITALS per patient if applicable:N/A  GENERAL: alert, oriented, appears well and in no acute distress  HEENT: atraumatic, normocephalic,conjunctiva clear, no obvious abnormalities on inspection.  LUNGS: on inspection no signs of respiratory distress, breathing rate appears normal, no obvious gross SOB, gasping or wheezing  CV: no obvious cyanosis  Brianna: moves all visible extremities without noticeable abnormality  PSYCH/NEURO: pleasant and cooperative, no obvious depression or anxiety, speech and thought processing grossly intact  ASSESSMENT AND PLAN:  Discussed the following assessment and plan:  Suprapubic abdominal pain Possible etiologies discussed. I do not think imaging is needed at this time. Because associated urinary symptoms will treat as UTI. Clearly instructed about warning signs.  Urine frequency - Plan:Culture, Urine Urine dipstick mildly abnormal. Negative for Leuk,RBC's,and nitrite. + Protein and bili. Ucx sent.  Urinary tract infection without hematuria, site unspecified - Plan: nitrofurantoin, macrocrystal-monohydrate, (MACROBID) 100 MG capsule Empiric abx treatment started today and will be tailored according to Ucx results and susceptibility report.  Clearly instructed about warning signs. F/U if symptoms persist.   I discussed the assessment and treatment plan with the patient. She was provided an opportunity to ask questions and all were answered. She agreed with the plan and demonstrated an understanding of the instructions.   The patient was advised to call back or seek an in-person evaluation if the symptoms worsen or if the condition  fails to improve as anticipated.  Return if symptoms worsen or fail to improve.    Anjolie Majer Martinique, MD

## 2019-02-09 NOTE — Progress Notes (Unsigned)
akw

## 2019-02-11 ENCOUNTER — Encounter: Payer: 59 | Admitting: Gastroenterology

## 2019-02-11 LAB — URINE CULTURE
MICRO NUMBER:: 1056201
Result:: NO GROWTH
SPECIMEN QUALITY:: ADEQUATE

## 2019-02-17 ENCOUNTER — Encounter: Payer: Self-pay | Admitting: Family Medicine

## 2019-02-24 ENCOUNTER — Ambulatory Visit: Payer: 59 | Admitting: Pulmonary Disease

## 2019-02-24 ENCOUNTER — Other Ambulatory Visit: Payer: Self-pay | Admitting: Internal Medicine

## 2019-02-24 DIAGNOSIS — I10 Essential (primary) hypertension: Secondary | ICD-10-CM

## 2019-03-12 ENCOUNTER — Ambulatory Visit (INDEPENDENT_AMBULATORY_CARE_PROVIDER_SITE_OTHER): Payer: 59 | Admitting: Pulmonary Disease

## 2019-03-12 ENCOUNTER — Other Ambulatory Visit: Payer: Self-pay

## 2019-03-12 ENCOUNTER — Encounter: Payer: Self-pay | Admitting: Pulmonary Disease

## 2019-03-12 VITALS — Ht 68.5 in | Wt 249.0 lb

## 2019-03-12 DIAGNOSIS — G4730 Sleep apnea, unspecified: Secondary | ICD-10-CM | POA: Diagnosis not present

## 2019-03-12 DIAGNOSIS — R05 Cough: Secondary | ICD-10-CM

## 2019-03-12 DIAGNOSIS — E669 Obesity, unspecified: Secondary | ICD-10-CM | POA: Diagnosis not present

## 2019-03-12 DIAGNOSIS — R058 Other specified cough: Secondary | ICD-10-CM

## 2019-03-12 DIAGNOSIS — G4733 Obstructive sleep apnea (adult) (pediatric): Secondary | ICD-10-CM

## 2019-03-12 MED ORDER — ALBUTEROL SULFATE HFA 108 (90 BASE) MCG/ACT IN AERS: 2.0000 | INHALATION_SPRAY | Freq: Four times a day (QID) | RESPIRATORY_TRACT | 5 refills | Status: DC | PRN

## 2019-03-12 NOTE — Patient Instructions (Signed)
Follow up in 1 year.

## 2019-03-12 NOTE — Progress Notes (Signed)
[facility]  Chief Complaint  Patient presents with  . Follow-up    sleep apnea    Constitutional:  Ht 5' 8.5" (1.74 m)   Wt 249 lb (112.9 kg)   LMP 01/07/2010 (LMP Unknown)   BMI 37.31 kg/m    Past Medical History:  HTN, HA, GERD  Brief Summary:  Brianna Foster is a 63 y.o. female with obstructive sleep apnea.  Virtual Visit via Telephone Note  I connected with Brianna Foster on 03/12/19 at  9:15 AM EST by telephone and verified that I am speaking with the correct person using two identifiers.  Location: Patient: home Provider: medical office   I discussed the limitations, risks, security and privacy concerns of performing an evaluation and management service by telephone and the availability of in person appointments. I also discussed with the patient that there may be a patient responsible charge related to this service. The patient expressed understanding and agreed to proceed.  Since her last visit she was started on CPAP.  Sleeping much better.  Using nasal pillows.  No issues with mask fit or air leak.  She had sore throat and wheeze few days ago.  This started after she had to turn her heater on.  Started using flonase and this helped.  Feels better now.  Had an albuterol inhaler previously and this would help if her symptoms settled into her chest.  She doesn't have albuterol at home currently.  Denies fever, sputum, chest pain, or hemoptysis.   Physical Exam:  Deferred.  Assessment/Plan:   Obstructive sleep apnea. - she is compliant with CPAP and reports benefit - continue auto CPAP  Obesity. - discussed importance of weight loss  Upper airway cough with post nasal drip. - likely has an allergic component and perhaps mild, intermittent asthma - continue flonase - will refill prn albuterol - if symptoms progress, then might need further allergy/asthma assessment    Patient Instructions  Follow up in 1  year     I discussed the assessment and treatment plan with the patient. The patient was provided an opportunity to ask questions and all were answered. The patient agreed with the plan and demonstrated an understanding of the instructions.   The patient was advised to call back or seek an in-person evaluation if the symptoms worsen or if the condition fails to improve as anticipated.  I provided 16 minutes of non-face-to-face time during this encounter.  Chesley Mires, MD [place] Pager: [phone] 03/12/2019, 9:34 AM  Flow Sheet      Sleep tests:  HST 08/26/17 >> AHI 30.3, SaO2 low 71%. HST 12/21/18 >> AHI 59.9, SpO2 low 76% Auto CPAP 02/09/19 to 03/10/19 >> used on 30 of 30 nights with average 8 hrs 14 min.  Average AHI 0.8 with median CPAP 8 and 95 th percentile CPAP 11 cm H2O.  Medications:   Allergies as of 03/12/2019      Reactions   Other Anaphylaxis, Shortness Of Breath, Swelling   [place] NUTS      Medication List       Accurate as of March 12, 2019  9:34 AM. If you have any questions, ask your nurse or doctor.        acetaminophen 500 MG tablet Commonly known as: TYLENOL Take 500 mg by mouth every 6 (six) hours as needed for mild pain.   albuterol 108 (90 Base) MCG/ACT inhaler Commonly known as: VENTOLIN HFA Inhale 2 puffs  into the lungs every 6 (six) hours as needed for wheezing or shortness of breath.   amLODipine 5 MG tablet Commonly known as: NORVASC Take 0.5 tablets (2.5 mg total) by mouth daily.   atorvastatin 20 MG tablet Commonly known as: LIPITOR Take 1 tablet (20 mg total) by mouth daily.   Blood Pressure Monitor Automat Devi 1 Device by Does not apply route daily.   famotidine 40 MG tablet Commonly known as: Pepcid Take 1 tablet (40 mg total) by mouth at bedtime.   fluticasone 50 MCG/ACT nasal spray Commonly known as: Flonase Place 1 spray into both nostrils 2 (two) times daily as needed for allergies or  rhinitis.   hydrochlorothiazide 25 MG tablet Commonly known as: HYDRODIURIL Take 1 tablet (25 mg total) by mouth daily.   losartan-hydrochlorothiazide 50-12.5 MG tablet Commonly known as: HYZAAR Take 1 tablet by mouth daily.   meclizine 25 MG tablet Commonly known as: ANTIVERT Take 1 tablet (25 mg total) by mouth 3 (three) times daily as needed for dizziness.   metoprolol tartrate 25 MG tablet Commonly known as: LOPRESSOR TAKE 0.5 TABLETS (12.5 MG TOTAL) BY MOUTH 2 (TWO) TIMES DAILY.   pantoprazole 40 MG tablet Commonly known as: PROTONIX Take 1 tablet (40 mg total) by mouth 2 (two) times daily. Take 30 to 60 minutes before a meal   sucralfate 1 GM/10ML suspension Commonly known as: CARAFATE Take 10 mLs (1 g total) by mouth every 6 (six) hours as needed.   triamcinolone cream 0.1 % Commonly known as: KENALOG Apply 1 application topically 2 (two) times daily as needed. What changed: reasons to take this   valACYclovir 500 MG tablet Commonly known as: VALTREX 1 TAB TWICE DAILY FOR 3 DAYS WITH OUTBREAKS, START WITHIN 48 HOURS AFTER ONSET. What changed:   how much to take  how to take this  when to take this  additional instructions       Past Surgical History:  She  has a past surgical history that includes neck fusion; left heart catheterization with coronary angiogram (N/A, 05/11/2013); and LEFT HEART CATH AND CORONARY ANGIOGRAPHY (N/A, 07/11/2017).  Family History:  Her family history includes Breast cancer in her sister; Esophageal cancer in her brother; Heart disease in her brother; Lung cancer in her mother; Stomach cancer in her father.  Social History:  She  reports that she quit smoking about a year ago. Her smoking use included cigarettes. She has a 4.00 pack-year smoking history. She has never used smokeless tobacco. She reports that she does not drink alcohol or use drugs.

## 2019-03-27 ENCOUNTER — Other Ambulatory Visit: Payer: Self-pay | Admitting: Family Medicine

## 2019-03-27 DIAGNOSIS — R600 Localized edema: Secondary | ICD-10-CM

## 2019-03-27 DIAGNOSIS — I10 Essential (primary) hypertension: Secondary | ICD-10-CM

## 2019-04-20 ENCOUNTER — Emergency Department (HOSPITAL_COMMUNITY)
Admission: EM | Admit: 2019-04-20 | Discharge: 2019-04-20 | Disposition: A | Discharge status: Home / Self Care | Payer: 59 | Attending: Emergency Medicine | Admitting: Emergency Medicine

## 2019-04-20 ENCOUNTER — Encounter (HOSPITAL_COMMUNITY): Payer: Self-pay | Admitting: Emergency Medicine

## 2019-04-20 ENCOUNTER — Telehealth: Payer: Self-pay | Admitting: Family Medicine

## 2019-04-20 ENCOUNTER — Emergency Department (HOSPITAL_COMMUNITY): Payer: 59

## 2019-04-20 ENCOUNTER — Encounter: Payer: Self-pay | Admitting: Family Medicine

## 2019-04-20 ENCOUNTER — Other Ambulatory Visit: Payer: Self-pay

## 2019-04-20 ENCOUNTER — Telehealth: Payer: 59 | Admitting: Family Medicine

## 2019-04-20 ENCOUNTER — Encounter: Payer: 59 | Admitting: Family Medicine

## 2019-04-20 DIAGNOSIS — R197 Diarrhea, unspecified: Secondary | ICD-10-CM | POA: Insufficient documentation

## 2019-04-20 DIAGNOSIS — R072 Precordial pain: Secondary | ICD-10-CM | POA: Diagnosis not present

## 2019-04-20 DIAGNOSIS — Z20822 Contact with and (suspected) exposure to covid-19: Secondary | ICD-10-CM | POA: Insufficient documentation

## 2019-04-20 DIAGNOSIS — I10 Essential (primary) hypertension: Secondary | ICD-10-CM | POA: Diagnosis not present

## 2019-04-20 DIAGNOSIS — Z87891 Personal history of nicotine dependence: Secondary | ICD-10-CM | POA: Diagnosis not present

## 2019-04-20 DIAGNOSIS — R0789 Other chest pain: Secondary | ICD-10-CM | POA: Diagnosis present

## 2019-04-20 LAB — CBC
HCT: 43.6 % (ref 36.0–46.0)
Hemoglobin: 14.5 g/dL (ref 12.0–15.0)
MCH: 30.7 pg (ref 26.0–34.0)
MCHC: 33.3 g/dL (ref 30.0–36.0)
MCV: 92.2 fL (ref 80.0–100.0)
Platelets: 320 10*3/uL (ref 150–400)
RBC: 4.73 MIL/uL (ref 3.87–5.11)
RDW: 12.4 % (ref 11.5–15.5)
WBC: 6.2 10*3/uL (ref 4.0–10.5)
nRBC: 0 % (ref 0.0–0.2)

## 2019-04-20 LAB — BASIC METABOLIC PANEL
Anion gap: 11 (ref 5–15)
BUN: 13 mg/dL (ref 8–23)
CO2: 25 mmol/L (ref 22–32)
Calcium: 9.6 mg/dL (ref 8.9–10.3)
Chloride: 103 mmol/L (ref 98–111)
Creatinine, Ser: 0.91 mg/dL (ref 0.44–1.00)
GFR calc Af Amer: >60 mL/min (ref >60)
GFR calc non Af Amer: >60 mL/min (ref >60)
Glucose, Bld: 103 mg/dL — ABNORMAL HIGH (ref 70–99)
Potassium: 3.8 mmol/L (ref 3.5–5.1)
Sodium: 139 mmol/L (ref 135–145)

## 2019-04-20 LAB — INFLUENZA PANEL BY PCR (TYPE A & B)
Influenza A By PCR: NEGATIVE
Influenza B By PCR: NEGATIVE

## 2019-04-20 LAB — POC SARS CORONAVIRUS 2 AG -  ED: SARS Coronavirus 2 Ag: NEGATIVE

## 2019-04-20 LAB — TROPONIN I (HIGH SENSITIVITY)
Troponin I (High Sensitivity): 4 ng/L (ref <18)
Troponin I (High Sensitivity): 4 ng/L (ref <18)

## 2019-04-20 MED ORDER — LOPERAMIDE HCL 2 MG PO CAPS: 2.0000 mg | ORAL_CAPSULE | Freq: Four times a day (QID) | ORAL | 0 refills | Status: DC | PRN

## 2019-04-20 MED ORDER — IOHEXOL 350 MG/ML SOLN
80.0000 mL | Freq: Once | INTRAVENOUS | Status: AC | PRN
  Administered 2019-04-20: 19:00:00 80 mL via INTRAVENOUS

## 2019-04-20 MED ORDER — SODIUM CHLORIDE 0.9% FLUSH: 3.0000 mL | Freq: Once | INTRAVENOUS | Status: DC

## 2019-04-20 NOTE — Progress Notes (Signed)
Appointment was canceled. Currently she is in the ER. Concerned about elevated BP, 193/100 ,and CP. I spoke with patient, she should have a CXR and EKG. Waiting to be seen by provider.  She is not in acute distress at this time. Recommend scheduling f/u appt. Brianna Fabry Martinique, MD

## 2019-04-20 NOTE — Telephone Encounter (Signed)
Patient called and would like to speak with Dr. Martinique because she is not feeling well, she was chills, put no temp, and diarrhea for 2 days.

## 2019-04-20 NOTE — ED Notes (Signed)
Pt transported to CT ?

## 2019-04-20 NOTE — ED Triage Notes (Signed)
Pt  Reports over the weekend she began to feel not well with fatigue and chills. Pt also had diarrhea starting on Saturday.  Pt woke up this am with a tightness in her chest and pain in right arm. Pt went to UC and was referred to ER.

## 2019-04-20 NOTE — Discharge Instructions (Signed)
You were seen in the emergency department today with diarrhea and chest discomfort.  Your labs and CT scans are reassuring.  I am testing you for COVID-19 and you should remain in isolation until this test results come back and you are feeling better for at least 3 days.  You can take Imodium as needed for diarrhea.  Return with any worsening chest pain, shortness of breath, fevers, abdominal pain.

## 2019-04-20 NOTE — Telephone Encounter (Signed)
I called and spoke with pt. She is scheduled for 2:30 with Dr. Martinique for a virtual appointment. She is going at 11:30 to get a rapid covid test done.

## 2019-04-20 NOTE — ED Provider Notes (Signed)
Emergency Department Provider Note   I have reviewed the triage vital signs and the nursing notes.   HISTORY  Chief Complaint Chest Pain and Diarrhea   HPI Brianna Foster is a 64 y.o. female with PMH of GERD, HTN, and OSA presents to the emergency department with diarrhea symptoms over the past 2 to 3 days with chills.  She denies fever but in the last 24 hours has developed a pain in the right arm/chest which is worse with coughing and deep breathing.  She does have some mild dyspnea which is worse with lying flat.  She is not having vomiting.  No blood or black in the diarrhea.  No sick contacts.  She initially presented to urgent care and was referred to the emergency department for further testing.  Denies active chest pain except when taking a deep breath. No radiation of symptoms or other modifying factors.   Past Medical History:  Diagnosis Date  . GERD (gastroesophageal reflux disease)   . Headache(784.0)   . Hypertension   . OSA (obstructive sleep apnea) 08/28/2017    Patient Active Problem List   Diagnosis Date Noted  . Healthcare maintenance 12/24/2018  . Rhinitis, allergic 06/23/2018  . OSA (obstructive sleep apnea) 08/28/2017  . Recurrent genital herpes 07/19/2017  . Unstable angina ([place]) 07/11/2017  . De Quervain's tenosynovitis, right 05/08/2016  . Cervical radiculopathy 05/08/2016  . Benign paroxysmal positional vertigo 03/20/2016  . Eczema 03/20/2016  . Hyperlipidemia, unspecified 03/20/2016  . Vertigo 10/28/2015  . Headache 10/28/2015  . Tobacco abuse 07/23/2012  . Gastroesophageal reflux disease 06/18/2011  . Essential hypertension 06/18/2011    Past Surgical History:  Procedure Laterality Date  . LEFT HEART CATH AND CORONARY ANGIOGRAPHY N/A 07/11/2017   Procedure: LEFT HEART CATH AND CORONARY ANGIOGRAPHY;  Surgeon: Martinique, Peter M, MD;  Location: [facility];  Service: Cardiovascular;  Laterality: N/A;  . LEFT HEART CATHETERIZATION WITH  CORONARY ANGIOGRAM N/A 05/11/2013   Procedure: LEFT HEART CATHETERIZATION WITH CORONARY ANGIOGRAM;  Surgeon: Burnell Blanks, MD;  Location: [facility];  Service: Cardiovascular;  Laterality: N/A;  . neck fusion      Allergies Other  Family History  Problem Relation Age of Onset  . Lung cancer Mother   . Stomach cancer Father   . Heart disease Brother        Pacemaker  . Esophageal cancer Brother   . Breast cancer Sister     Social History Social History   Tobacco Use  . Smoking status: Former Smoker    Packs/day: 0.25    Years: 16.00    Pack years: 4.00    Types: Cigarettes    Quit date: 03/09/2018    Years since quitting: 1.1  . Smokeless tobacco: Never Used  Substance Use Topics  . Alcohol use: No    Comment: Occassional Use  . Drug use: No    Review of Systems  Constitutional: No fever. Positive chills.  Eyes: No visual changes. ENT: No sore throat. Cardiovascular: Pleuritic chest pain. Respiratory: Mild shortness of breath and cough.  Gastrointestinal: No abdominal pain.  No nausea, no vomiting. Positive diarrhea.  No constipation. Genitourinary: Negative for dysuria. Musculoskeletal: Negative for back pain. Skin: Negative for rash. Neurological: Negative for headaches, focal weakness or numbness.  10-point ROS otherwise negative.  ____________________________________________   PHYSICAL EXAM:  VITAL SIGNS: ED Triage Vitals [04/20/19 1427]  Enc Vitals Group     BP (!) 161/88     Pulse  Rate 60     Resp 16     Temp 98.2 F (36.8 C)     Temp Source Oral     SpO2 96 %   Constitutional: Alert and oriented. Well appearing and in no acute distress. Eyes: Conjunctivae are normal.  Head: Atraumatic. Nose: No congestion/rhinnorhea. Mouth/Throat: Mucous membranes are moist.  Neck: No stridor.  Cardiovascular: Normal rate, regular rhythm. Good peripheral circulation. Grossly normal heart sounds.   Respiratory: Normal respiratory effort. No  retractions. Lungs CTAB. Gastrointestinal: Soft and nontender. No distention.  Musculoskeletal: No gross deformities of extremities. No LE edema.  Neurologic:  Normal speech and language.  Skin:  Skin is warm, dry and intact. No rash noted.  ____________________________________________   LABS (all labs ordered are listed, but only abnormal results are displayed)  Labs Reviewed  BASIC METABOLIC PANEL - Abnormal; Notable for the following components:      Result Value   Glucose, Bld 103 (*)    All other components within normal limits  NOVEL CORONAVIRUS, NAA (HOSP ORDER, SEND-OUT TO REF LAB; TAT 18-24 HRS)  CBC  INFLUENZA PANEL BY PCR (TYPE A & B)  POC SARS CORONAVIRUS 2 AG -  ED  TROPONIN I (HIGH SENSITIVITY)  TROPONIN I (HIGH SENSITIVITY)   ____________________________________________  EKG   EKG Interpretation  Date/Time:  Monday April 20 2019 14:33:05 EST Ventricular Rate:  62 PR Interval:  160 QRS Duration: 96 QT Interval:  434 QTC Calculation: 440 R Axis:   25 Text Interpretation: Normal sinus rhythm Normal ECG No STEMI Confirmed by Nanda Quinton [phone]) on 04/20/2019 4:47:24 PM       ____________________________________________  RADIOLOGY  DG Chest 2 View  Result Date: 04/20/2019 CLINICAL DATA:  Chest pain EXAM: CHEST - 2 VIEW COMPARISON:  08/25/2018 FINDINGS: Heart and mediastinal contours are within normal limits. No focal opacities or effusions. No acute bony abnormality. IMPRESSION: Negative Electronically Signed   By: Rolm Baptise M.D.   On: 04/20/2019 15:04   CT Angio Chest PE W and/or Wo Contrast  Result Date: 04/20/2019 CLINICAL DATA:  PE suspected. High pretest probability. Shortness of breath. EXAM: CT ANGIOGRAPHY CHEST WITH CONTRAST TECHNIQUE: Multidetector CT imaging of the chest was performed using the standard protocol during bolus administration of intravenous contrast. Multiplanar CT image reconstructions and MIPs were obtained to evaluate the  vascular anatomy. CONTRAST:  4mL OMNIPAQUE IOHEXOL 350 MG/ML SOLN COMPARISON:  08/03/2015. FINDINGS: Cardiovascular: Evaluation for pulmonary emboli is severely limited by suboptimal contrast bolus timing and respiratory motion artifact.Given these limitations, no large centrally located pulmonary embolism was detected. Given these limitations, no large pulmonary embolism was detected. The main pulmonary artery is within normal limits for size. There is no CT evidence of acute right heart strain. The visualized aorta is normal. Heart size is normal, without pericardial effusion. Mediastinum/Nodes: --No mediastinal or hilar lymphadenopathy. --No axillary lymphadenopathy. --No supraclavicular lymphadenopathy. --Normal thyroid gland. --The esophagus is unremarkable Lungs/Pleura: There is no pneumothorax. No significant pleural effusion. There is some atelectasis in the lingula and at the left lung bases which is overall mild. The trachea is unremarkable. Upper Abdomen: There is heterogeneous hepatic steatosis involving the partially visualized liver. There is no acute abnormality detected in the upper abdomen. Musculoskeletal: No chest wall abnormality. No acute or significant osseous findings. Review of the MIP images confirms the above findings. IMPRESSION: 1. Limited study for the detection of pulmonary emboli as detailed above. Given these limitations, no PE was identified. 2. No acute cardiopulmonary  process. The lungs are essentially clear. 3. Hepatic steatosis. Electronically Signed   By: Constance Holster M.D.   On: 04/20/2019 19:03    ____________________________________________   PROCEDURES  Procedure(s) performed:   Procedures  None  ____________________________________________   INITIAL IMPRESSION / ASSESSMENT AND PLAN / ED COURSE  Pertinent labs & imaging results that were available during my care of the patient were reviewed by me and considered in my medical decision making (see chart  for details).   Patient presents to the emergency department with chills, diarrhea, atypical chest pain.  Pain is somewhat pleuritic in nature.  My suspicion for Covid is somewhat elevated and I have sent initial testing.  Chest x-ray is clear and without infiltrate.  Initial troponin is low.  Lower suspicion for ACS clinically.  With pleuritic type pain I am sending CTA of the chest to evaluate for PE.    CTA not ideal with poorly timed bolus but no large proximal PE.  Favor MSK etiology clinically.  Repeat troponin unchanged at 4.  Plan for PCR and Covid testing along with flu testing.  Patient to remain out of work pending testing and in isolation.  Discussed ED return precautions.  Plan for supportive care at home with Imodium for diarrhea.  Low suspicion for diverticulitis clinically with no focal tenderness on abdominal exam.  Discussed ED return precautions.  Patient is pleased at discharge. ____________________________________________  FINAL CLINICAL IMPRESSION(S) / ED DIAGNOSES  Final diagnoses:  Precordial chest pain  Diarrhea of presumed infectious origin     MEDICATIONS GIVEN DURING THIS VISIT:  Medications  sodium chloride flush (NS) 0.9 % injection 3 mL (3 mLs Intravenous Not Given 04/20/19 1706)  iohexol (OMNIPAQUE) 350 MG/ML injection 80 mL (80 mLs Intravenous Contrast Given 04/20/19 1855)     NEW OUTPATIENT MEDICATIONS STARTED DURING THIS VISIT:  New Prescriptions   LOPERAMIDE (IMODIUM) 2 MG CAPSULE    Take 1 capsule (2 mg total) by mouth 4 (four) times daily as needed for diarrhea or loose stools.    Note:  This document was prepared using Dragon voice recognition software and may include unintentional dictation errors.  Nanda Quinton, MD, [facility] Emergency Medicine    Hamad Whyte, [facility], MD 04/20/19 Joen Laura

## 2019-04-21 LAB — NOVEL CORONAVIRUS, NAA (HOSP ORDER, SEND-OUT TO REF LAB; TAT 18-24 HRS): SARS-CoV-2, NAA: NOT DETECTED

## 2019-05-07 ENCOUNTER — Other Ambulatory Visit: Payer: Self-pay | Admitting: Family Medicine

## 2019-05-07 DIAGNOSIS — K2100 Gastro-esophageal reflux disease with esophagitis, without bleeding: Secondary | ICD-10-CM

## 2019-05-07 DIAGNOSIS — I10 Essential (primary) hypertension: Secondary | ICD-10-CM

## 2019-05-14 ENCOUNTER — Other Ambulatory Visit: Payer: Self-pay | Admitting: Family Medicine

## 2019-05-14 DIAGNOSIS — Z1231 Encounter for screening mammogram for malignant neoplasm of breast: Secondary | ICD-10-CM

## 2019-05-19 ENCOUNTER — Ambulatory Visit: Payer: 59 | Admitting: Family Medicine

## 2019-05-19 ENCOUNTER — Ambulatory Visit: Payer: 59

## 2019-05-20 ENCOUNTER — Other Ambulatory Visit: Payer: Self-pay

## 2019-05-20 ENCOUNTER — Ambulatory Visit (INDEPENDENT_AMBULATORY_CARE_PROVIDER_SITE_OTHER): Payer: 59 | Admitting: Family Medicine

## 2019-05-20 ENCOUNTER — Encounter: Payer: Self-pay | Admitting: Family Medicine

## 2019-05-20 VITALS — BP 128/70 | Ht 68.5 in | Wt 252.0 lb

## 2019-05-20 DIAGNOSIS — R350 Frequency of micturition: Secondary | ICD-10-CM | POA: Diagnosis not present

## 2019-05-20 DIAGNOSIS — I10 Essential (primary) hypertension: Secondary | ICD-10-CM | POA: Diagnosis not present

## 2019-05-20 DIAGNOSIS — R102 Pelvic and perineal pain: Secondary | ICD-10-CM | POA: Diagnosis not present

## 2019-05-20 MED ORDER — CYCLOBENZAPRINE HCL 10 MG PO TABS: 10.0000 mg | ORAL_TABLET | Freq: Three times a day (TID) | ORAL | 0 refills | Status: DC | PRN

## 2019-05-20 MED ORDER — CELECOXIB 100 MG PO CAPS: 100.0000 mg | ORAL_CAPSULE | Freq: Two times a day (BID) | ORAL | 0 refills | Status: AC

## 2019-05-20 MED ORDER — LOSARTAN POTASSIUM-HCTZ 50-12.5 MG PO TABS: 1.0000 | ORAL_TABLET | Freq: Every day | ORAL | 2 refills | Status: DC

## 2019-05-20 NOTE — Patient Instructions (Signed)
A few things to remember from today's visit:   Suprapubic abdominal pain - Plan: Urinalysis, Routine w reflex microscopic, US PELVIC COMPLETE WITH TRANSVAGINAL, celecoxib (CELEBREX) 100 MG capsule, cyclobenzaprine (FLEXERIL) 10 MG tablet  Urine frequency - Plan: Urinalysis, Routine w reflex microscopic  Essential hypertension - Plan: Hepatic function panel  Celebrex and Flexeril started today.   Please be sure medication list is accurate. If a new problem present, please set up appointment sooner than planned today.

## 2019-05-20 NOTE — Progress Notes (Signed)
ACUTE VISIT   HPI:  Chief Complaint  Patient presents with  . Pelvic Pain    [name] is a 64 y.o. female, who is here today complaining of 3 months of suprapubic abdominal pain,intermittent,exacerbated by getting up after long sitting /resting. Worse in the morning when she first gets up. Alleviated after a few min of being up, walking.  Negative for urinary symptoms, except for urinary frequency, which is a chronic problem and stable. Pressure like pain, 8/10, no radiated. No associated fever,chills,nausea,vomiting,or vaginal bleeding/discharge. She has not been sexually active in 2 years.  Pap smear in 04/2016 negative.  U/A in 02/2019, when she was already having pain was negative except for +protein and bili.   Lab Results  Component Value Date   ALT 25 07/11/2017   AST 27 07/11/2017   ALKPHOS 73 07/11/2017   BILITOT 0.5 07/11/2017   HTN: She is on Amlodipine 5 mg daily,Metoprolol tartrate 25 mg 1/2 tab bid,and Losartan-HCTZ 50-12.5 mg daily. BP has been "good." Negative for severe/frequent headache, visual changes, chest pain, dyspnea, palpitation, claudication, focal weakness, or edema. Lab Results  Component Value Date   CREATININE 0.91 04/20/2019   BUN 13 04/20/2019   NA 139 04/20/2019   K 3.8 04/20/2019   CL 103 04/20/2019   CO2 25 04/20/2019     Review of Systems  Constitutional: Negative for activity change, appetite change, fatigue and unexpected weight change.  HENT: Negative for mouth sores, nosebleeds and sore throat.   Respiratory: Negative for cough and wheezing.   Gastrointestinal:       Negative for changes in bowel habits.  Genitourinary: Negative for flank pain and genital sores.  Musculoskeletal: Negative for gait problem and myalgias.  Skin: Negative for rash and wound.  Neurological: Negative for syncope and facial asymmetry.  Hematological: Negative for adenopathy. Does not bruise/bleed easily.  Rest see pertinent  positives and negatives per HPI.   Current Outpatient Medications on File Prior to Visit  Medication Sig Dispense Refill  . acetaminophen (TYLENOL) 500 MG tablet Take 500 mg by mouth every 6 (six) hours as needed for mild pain.    [name] albuterol (VENTOLIN HFA) 108 (90 Base) MCG/ACT inhaler Inhale 2 puffs into the lungs every 6 (six) hours as needed for wheezing or shortness of breath. 6.7 g 5  . amLODipine (NORVASC) 5 MG tablet TAKE 1 TABLET BY MOUTH EVERY DAY 90 tablet 2  . atorvastatin (LIPITOR) 20 MG tablet Take 1 tablet (20 mg total) by mouth daily. 90 tablet 1  . Blood Pressure Monitoring (BLOOD PRESSURE MONITOR AUTOMAT) DEVI 1 Device by Does not apply route daily. 1 Device 0  . famotidine (PEPCID) 40 MG tablet Take 1 tablet (40 mg total) by mouth at bedtime. 90 tablet 0  . fluticasone (FLONASE) 50 MCG/ACT nasal spray Place 1 spray into both nostrils 2 (two) times daily as needed for allergies or rhinitis. 16 g 5  . hydrochlorothiazide (HYDRODIURIL) 25 MG tablet TAKE 1 TABLET BY MOUTH EVERY DAY 90 tablet 1  . loperamide (IMODIUM) 2 MG capsule Take 1 capsule (2 mg total) by mouth 4 (four) times daily as needed for diarrhea or loose stools. 12 capsule 0  . meclizine (ANTIVERT) 25 MG tablet Take 1 tablet (25 mg total) by mouth 3 (three) times daily as needed for dizziness. 30 tablet 2  . metoprolol tartrate (LOPRESSOR) 25 MG tablet TAKE 0.5 TABLETS (12.5 MG TOTAL) BY MOUTH 2 (TWO) TIMES DAILY. [place]  tablet 1  . pantoprazole (PROTONIX) 40 MG tablet Take 1 tablet (40 mg total) by mouth 2 (two) times daily. Take 30 to 60 minutes before a meal 60 tablet 5  . sucralfate (CARAFATE) 1 GM/10ML suspension Take 10 mLs (1 g total) by mouth every 6 (six) hours as needed. 420 mL 3  . triamcinolone cream (KENALOG) 0.1 % Apply 1 application topically 2 (two) times daily as needed. (Patient taking differently: Apply 1 application topically 2 (two) times daily as needed (to affected sites- for itching). ) 45 g 1  .  valACYclovir (VALTREX) 500 MG tablet 1 TAB TWICE DAILY FOR 3 DAYS WITH OUTBREAKS, START WITHIN 48 HOURS AFTER ONSET. (Patient taking differently: Take 500 mg by mouth 2 (two) times daily. FOR 3 DAYS WITH OUTBREAKS, START WITHIN 48 HOURS AFTER ONSET.) 18 tablet 1   No current facility-administered medications on file prior to visit.     Past Medical History:  Diagnosis Date  . GERD (gastroesophageal reflux disease)   . Headache(784.0)   . Hypertension   . OSA (obstructive sleep apnea) 08/28/2017   Allergies  Allergen Reactions  . Other Anaphylaxis, Shortness Of Breath and Swelling    [place] NUTS    Social History   Socioeconomic History  . Marital status: Divorced    Spouse name: Not on file  . Number of children: 1  . Years of education: College  . Highest education level: Not on file  Occupational History    Comment: [employer]  Tobacco Use  . Smoking status: Former Smoker    Packs/day: 0.25    Years: 16.00    Pack years: 4.00    Types: Cigarettes    Quit date: 03/09/2018    Years since quitting: 1.1  . Smokeless tobacco: Never Used  Substance and Sexual Activity  . Alcohol use: No    Comment: Occassional Use  . Drug use: No  . Sexual activity: Not Currently    Birth control/protection: Post-menopausal  Other Topics Concern  . Not on file  Social History Narrative   Lives at home alone   Right-handed   Drinks decaf coffee   Social Determinants of Health   Financial Resource Strain:   . Difficulty of Paying Living Expenses: Not on file  Food Insecurity:   . Worried About Charity fundraiser in the Last Year: Not on file  . Ran Out of Food in the Last Year: Not on file  Transportation Needs:   . Lack of Transportation (Medical): Not on file  . Lack of Transportation (Non-Medical): Not on file  Physical Activity:   . Days of Exercise per Week: Not on file  . Minutes of Exercise per Session: Not on file  Stress:   . Feeling of Stress : Not on file    Social Connections:   . Frequency of Communication with Friends and Family: Not on file  . Frequency of Social Gatherings with Friends and Family: Not on file  . Attends Religious Services: Not on file  . Active Member of Clubs or Organizations: Not on file  . Attends Archivist Meetings: Not on file  . Marital Status: Not on file    Vitals:   05/20/19 1555  BP: 128/70   Body mass index is 37.76 kg/m.   Physical Exam  Nursing note and vitals reviewed. Constitutional: She is oriented to person, place, and time. She appears well-developed. No distress.  HENT:  Head: Normocephalic and atraumatic.  Mouth/Throat: Mucous membranes  are normal.  Eyes: Pupils are equal, round, and reactive to light. Conjunctivae are normal.  Cardiovascular: Normal rate and regular rhythm.  No murmur heard. Respiratory: Effort normal and breath sounds normal. No respiratory distress.  GI: Soft. She exhibits no mass. There is no hepatomegaly. There is no abdominal tenderness. There is no CVA tenderness. No hernia. Hernia confirmed negative in the right inguinal area and confirmed negative in the left inguinal area.  Musculoskeletal:        General: No edema.     Lumbar back: No tenderness or bony tenderness.  Lymphadenopathy:    She has no cervical adenopathy.  Neurological: She is alert and oriented to person, place, and time. She has normal strength. No cranial nerve deficit. Gait normal.  Skin: Skin is warm. No rash noted. No erythema.  Psychiatric: She has a normal mood and affect.  Well groomed, good eye contact.    ASSESSMENT AND PLAN:  Ms. Kameela was seen today for pelvic pain.  Diagnoses and all orders for this visit:  Orders Placed This Encounter  Procedures  . US PELVIC COMPLETE WITH TRANSVAGINAL  . Hepatic function panel    Suprapubic abdominal pain We discussed possible etiologies. ? Musculoskeletal,gyn among some. She would like to try a muscle relaxant, Flexeril to  try. Celebrex x 7 days. Medication side effects discussed. Pelvic US will be arranged.  -     celecoxib (CELEBREX) 100 MG capsule; Take 1 capsule (100 mg total) by mouth 2 (two) times daily for 15 days. -     cyclobenzaprine (FLEXERIL) 10 MG tablet; Take 1 tablet (10 mg total) by mouth 3 (three) times daily as needed for muscle spasms.  Urine frequency Chronic and stable. ? Overactive bladder.  Essential hypertension BP adequately controlled. No changes in current management.  Continue low salt diet. Some side effects of NSAID's discussed. Monitoring BP more frequent while taking Celebrex.  -     losartan-hydrochlorothiazide (HYZAAR) 50-12.5 MG tablet; Take 1 tablet by mouth daily. -     Hepatic function panel  Return in about 2 weeks (around 06/03/2019) for cpe.   Nolawi Kanady G. Martinique, MD  [facility]. [place] office.

## 2019-05-26 ENCOUNTER — Other Ambulatory Visit: Payer: Self-pay

## 2019-05-27 ENCOUNTER — Encounter: Payer: 59 | Admitting: Family Medicine

## 2019-06-02 ENCOUNTER — Other Ambulatory Visit: Payer: 59

## 2019-06-02 ENCOUNTER — Ambulatory Visit: Payer: 59

## 2019-06-04 ENCOUNTER — Other Ambulatory Visit: Payer: Self-pay | Admitting: Family Medicine

## 2019-06-04 DIAGNOSIS — I10 Essential (primary) hypertension: Secondary | ICD-10-CM

## 2019-06-08 ENCOUNTER — Ambulatory Visit
Admission: RE | Admit: 2019-06-08 | Discharge: 2019-06-08 | Disposition: A | Discharge status: Home / Self Care | Payer: 59 | Source: Ambulatory Visit | Attending: Family Medicine | Admitting: Family Medicine

## 2019-06-08 ENCOUNTER — Other Ambulatory Visit: Payer: Self-pay

## 2019-06-08 DIAGNOSIS — Z1231 Encounter for screening mammogram for malignant neoplasm of breast: Secondary | ICD-10-CM

## 2019-06-08 DIAGNOSIS — R102 Pelvic and perineal pain: Secondary | ICD-10-CM

## 2019-06-09 ENCOUNTER — Encounter: Payer: Self-pay | Admitting: Family Medicine

## 2019-07-13 ENCOUNTER — Other Ambulatory Visit: Payer: Self-pay | Admitting: Family Medicine

## 2019-07-13 ENCOUNTER — Other Ambulatory Visit: Payer: Self-pay | Admitting: *Deleted

## 2019-07-13 ENCOUNTER — Telehealth: Payer: Self-pay | Admitting: Family Medicine

## 2019-07-13 DIAGNOSIS — R102 Pelvic and perineal pain: Secondary | ICD-10-CM

## 2019-07-13 MED ORDER — TRAMADOL HCL 50 MG PO TABS: 50.0000 mg | ORAL_TABLET | Freq: Two times a day (BID) | ORAL | 0 refills | Status: AC | PRN

## 2019-07-13 NOTE — Telephone Encounter (Signed)
Patient called office to go over results from 06/09/19. Patient stated that she has been hurting really bad over the past week and can barely walk, would like something called in for pain. Patient also stated that she would like to be seen by GYN ASAP.

## 2019-07-13 NOTE — Telephone Encounter (Signed)
[  We need to change My chart status because I sent results to her in 05/2019.] Pelvic US showed a small fibroid tumors. This could be causing or contributing a her pelvic pain.  I can send another Rx for celebrex for pain. If she doe snot have a gyn , referral can be placed. Time for appt depends on the gyn's office depending of date of onset and symptomatology.  If pain gets suddenly worse she needs to go to the ER. Continue monitoring BP. Thanks, BJ

## 2019-07-13 NOTE — Telephone Encounter (Signed)
Left patient message to return call to office.

## 2019-07-13 NOTE — Telephone Encounter (Signed)
Patient given recommendations per Dr. Martinique. Patient stated that Celebrex did not work for her. Both she and Dr. Martinique agreed on 5 days work of Tramadol. GYN referral placed.

## 2019-07-17 ENCOUNTER — Other Ambulatory Visit: Payer: Self-pay

## 2019-07-20 ENCOUNTER — Other Ambulatory Visit: Payer: Self-pay

## 2019-07-20 ENCOUNTER — Ambulatory Visit (INDEPENDENT_AMBULATORY_CARE_PROVIDER_SITE_OTHER): Payer: 59 | Admitting: Nurse Practitioner

## 2019-07-20 ENCOUNTER — Encounter: Payer: Self-pay | Admitting: Nurse Practitioner

## 2019-07-20 VITALS — BP 122/80 | Ht 68.0 in | Wt 248.0 lb

## 2019-07-20 DIAGNOSIS — M545 Low back pain, unspecified: Secondary | ICD-10-CM

## 2019-07-20 DIAGNOSIS — Z124 Encounter for screening for malignant neoplasm of cervix: Secondary | ICD-10-CM

## 2019-07-20 DIAGNOSIS — R1030 Lower abdominal pain, unspecified: Secondary | ICD-10-CM

## 2019-07-20 MED ORDER — IBUPROFEN 600 MG PO TABS: 600.0000 mg | ORAL_TABLET | Freq: Three times a day (TID) | ORAL | 1 refills | Status: DC | PRN

## 2019-07-20 NOTE — Progress Notes (Signed)
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  Describes pain as pressure/pulling, sometimes 10 out of 10 pain, worse with walking and certain positions such as lying down.  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  Complains of urinary frequency at times, denies burning with urination or incontinence, denies vaginal itching,  odor, bleeding or discharge.  Denies any GI symptoms other than acid reflux which has been worse in the last couple of months.  No history of abdominal surgeries.  Admits to gaining 30 pounds in the past year since Covid.  Postmenopausal for 6 years.  Pap in 2017 was normal, colonoscopy in 2017 showed polyps with recommendations to have next colonoscopy in 5 years.  Mammogram March 2021 was negative.  Not sexually active in 1.5 years. Has a desk job with minimal activity. No recent change in activity, exercise or routine.   Past medical history, past surgical history, family history and social history were all reviewed and documented in the EPIC chart.  ROS:  A ROS was performed and pertinent positives and negatives are included.  Exam:  Vitals:   07/20/19 1146  BP: 122/80  Weight: 248 lb (112.5 kg)  Height: 5\' 8"  (1.727 m)   Body mass index is 37.71 kg/m.   General appearance:  Normal Thyroid:  Symmetrical, normal in size, without palpable masses or nodularity. Respiratory  Auscultation:  Clear without wheezing or rhonchi Cardiovascular  Auscultation:  Regular rate, without rubs, murmurs or gallops  Edema/varicosities:  Not grossly evident Abdominal  Soft, tender to palpation of mid lower quadrant, without masses, guarding present  Liver/spleen:  No organomegaly noted  Hernia:  None appreciated  MSK  Tenderness to palpation of mid lower sacral back  Skin  Inspection:  Grossly  normal   Breasts: Examined lying and sitting.     Right: Without masses, retractions, discharge or axillary adenopathy.     Left: Without masses, retractions, discharge or axillary adenopathy. Gentitourinary   Inguinal/mons:  Normal without inguinal adenopathy  External genitalia:  Normal  BUS/Urethra/Skene's glands:  Normal  Vagina:  Normal, no discharge, or erythema  Cervix:  Normal, no discharge, no CMT  Uterus:  Anteverted, normal in size, shape and contour.  Midline and mobile  Adnexa/parametria:     Rt: Without masses or tenderness.   Lt: Without masses or tenderness.  Anus and perineum: Normal   Assessment/Plan:  64 y.o.  SBF G1P1 presents as new patient.    Persistent lower Abdominal pain Acute midline lower back pain, without sciatica Weight loss management PCP labs  Plan: Weight loss management discussed, handouts provided on low calorie diet.  Encouraged walking as much as patient can tolerate. Start with 10 minutes 2-3 times a day. Does not seem to be GYN related, but more referred pain from mid lower back due to large weight gain.  If symptoms do not improve with weight loss we should consider a consult to neurologist. Motrin 600 mg every 8 hours as needed for pain. Will call with Pap smear results.  Follow-up in 1 year for annual visit.    Tamela Gammon [facility], 11:51 AM 07/20/2019

## 2019-07-20 NOTE — Patient Instructions (Addendum)
Motrin 600 mg every 8 hours as needed for pain Weight loss recommended Multivitamin daily Follow up in 1 year for annual  Calorie Counting for Weight Loss Calories are units of energy. Your body needs a certain amount of calories from food to keep you going throughout the day. When you eat more calories than your body needs, your body stores the extra calories as fat. When you eat fewer calories than your body needs, your body burns fat to get the energy it needs. Calorie counting means keeping track of how many calories you eat and drink each day. Calorie counting can be helpful if you need to lose weight. If you make sure to eat fewer calories than your body needs, you should lose weight. Ask your health care provider what a healthy weight is for you. For calorie counting to work, you will need to eat the right number of calories in a day in order to lose a healthy amount of weight per week. A dietitian can help you determine how many calories you need in a day and will give you suggestions on how to reach your calorie goal.  A healthy amount of weight to lose per week is usually 1-2 lb (0.5-0.9 kg). This usually means that your daily calorie intake should be reduced by 500-750 calories.  Eating 1,200 - 1,500 calories per day can help most women lose weight.  Eating 1,500 - 1,800 calories per day can help most men lose weight. What is my plan? My goal is to have __________ calories per day. If I have this many calories per day, I should lose around __________ pounds per week. What do I need to know about calorie counting? In order to meet your daily calorie goal, you will need to:  Find out how many calories are in each food you would like to eat. Try to do this before you eat.  Decide how much of the food you plan to eat.  Write down what you ate and how many calories it had. Doing this is called keeping a food log. To successfully lose weight, it is important to balance calorie counting  with a healthy lifestyle that includes regular activity. Aim for 150 minutes of moderate exercise (such as walking) or 75 minutes of vigorous exercise (such as running) each week. Where do I find calorie information?  The number of calories in a food can be found on a Nutrition Facts label. If a food does not have a Nutrition Facts label, try to look up the calories online or ask your dietitian for help. Remember that calories are listed per serving. If you choose to have more than one serving of a food, you will have to multiply the calories per serving by the amount of servings you plan to eat. For example, the label on a package of bread might say that a serving size is 1 slice and that there are 90 calories in a serving. If you eat 1 slice, you will have eaten 90 calories. If you eat 2 slices, you will have eaten 180 calories. How do I keep a food log? Immediately after each meal, record the following information in your food log:  What you ate. Don't forget to include toppings, sauces, and other extras on the food.  How much you ate. This can be measured in cups, ounces, or number of items.  How many calories each food and drink had.  The total number of calories in the meal. Keep your food  log near you, such as in a small notebook in your pocket, or use a mobile app or website. Some programs will calculate calories for you and show you how many calories you have left for the day to meet your goal. What are some calorie counting tips?   Use your calories on foods and drinks that will fill you up and not leave you hungry: ? Some examples of foods that fill you up are nuts and nut butters, vegetables, lean proteins, and high-fiber foods like whole grains. High-fiber foods are foods with more than 5 g fiber per serving. ? Drinks such as sodas, specialty coffee drinks, alcohol, and juices have a lot of calories, yet do not fill you up.  Eat nutritious foods and avoid empty calories. Empty  calories are calories you get from foods or beverages that do not have many vitamins or protein, such as candy, sweets, and soda. It is better to have a nutritious high-calorie food (such as an avocado) than a food with few nutrients (such as a bag of chips).  Know how many calories are in the foods you eat most often. This will help you calculate calorie counts faster.  Pay attention to calories in drinks. Low-calorie drinks include water and unsweetened drinks.  Pay attention to nutrition labels for "low fat" or "fat free" foods. These foods sometimes have the same amount of calories or more calories than the full fat versions. They also often have added sugar, starch, or salt, to make up for flavor that was removed with the fat.  Find a way of tracking calories that works for you. Get creative. Try different apps or programs if writing down calories does not work for you. What are some portion control tips?  Know how many calories are in a serving. This will help you know how many servings of a certain food you can have.  Use a measuring cup to measure serving sizes. You could also try weighing out portions on a kitchen scale. With time, you will be able to estimate serving sizes for some foods.  Take some time to put servings of different foods on your favorite plates, bowls, and cups so you know what a serving looks like.  Try not to eat straight from a bag or box. Doing this can lead to overeating. Put the amount you would like to eat in a cup or on a plate to make sure you are eating the right portion.  Use smaller plates, glasses, and bowls to prevent overeating.  Try not to multitask (for example, watch TV or use your computer) while eating. If it is time to eat, sit down at a table and enjoy your food. This will help you to know when you are full. It will also help you to be aware of what you are eating and how much you are eating. What are tips for following this plan? Reading food  labels  Check the calorie count compared to the serving size. The serving size may be smaller than what you are used to eating.  Check the source of the calories. Make sure the food you are eating is high in vitamins and protein and low in saturated and trans fats. Shopping  Read nutrition labels while you shop. This will help you make healthy decisions before you decide to purchase your food.  Make a grocery list and stick to it. Cooking  Try to cook your favorite foods in a healthier way. For example, try baking  instead of frying.  Use low-fat dairy products. Meal planning  Use more fruits and vegetables. Half of your plate should be fruits and vegetables.  Include lean proteins like poultry and fish. How do I count calories when eating out?  Ask for smaller portion sizes.  Consider sharing an entree and sides instead of getting your own entree.  If you get your own entree, eat only half. Ask for a box at the beginning of your meal and put the rest of your entree in it so you are not tempted to eat it.  If calories are listed on the menu, choose the lower calorie options.  Choose dishes that include vegetables, fruits, whole grains, low-fat dairy products, and lean protein.  Choose items that are boiled, broiled, grilled, or steamed. Stay away from items that are buttered, battered, fried, or served with cream sauce. Items labeled "crispy" are usually fried, unless stated otherwise.  Choose water, low-fat milk, unsweetened iced tea, or other drinks without added sugar. If you want an alcoholic beverage, choose a lower calorie option such as a glass of wine or light beer.  Ask for dressings, sauces, and syrups on the side. These are usually high in calories, so you should limit the amount you eat.  If you want a salad, choose a garden salad and ask for grilled meats. Avoid extra toppings like bacon, cheese, or fried items. Ask for the dressing on the side, or ask for olive oil  and vinegar or lemon to use as dressing.  Estimate how many servings of a food you are given. For example, a serving of cooked rice is  cup or about the size of half a baseball. Knowing serving sizes will help you be aware of how much food you are eating at restaurants. The list below tells you how big or small some common portion sizes are based on everyday objects: ? 1 oz--4 stacked dice. ? 3 oz--1 deck of cards. ? 1 tsp--1 die. ? 1 Tbsp-- a ping-pong ball. ? 2 Tbsp--1 ping-pong ball. ?  cup-- baseball. ? 1 cup--1 baseball. Summary  Calorie counting means keeping track of how many calories you eat and drink each day. If you eat fewer calories than your body needs, you should lose weight.  A healthy amount of weight to lose per week is usually 1-2 lb (0.5-0.9 kg). This usually means reducing your daily calorie intake by 500-750 calories.  The number of calories in a food can be found on a Nutrition Facts label. If a food does not have a Nutrition Facts label, try to look up the calories online or ask your dietitian for help.  Use your calories on foods and drinks that will fill you up, and not on foods and drinks that will leave you hungry.  Use smaller plates, glasses, and bowls to prevent overeating. This information is not intended to replace advice given to you by your health care provider. Make sure you discuss any questions you have with your health care provider. Document Revised: 12/13/2017 Document Reviewed: 02/24/2016 Elsevier Patient Education  2020 [place] Maintenance, Female Adopting a healthy lifestyle and getting preventive care are important in promoting health and wellness. Ask your health care provider about:  The right schedule for you to have regular tests and exams.  Things you can do on your own to prevent diseases and keep yourself healthy. What should I know about diet, weight, and exercise? Eat a healthy diet   Eat a diet  that includes  plenty of vegetables, fruits, low-fat dairy products, and lean protein.  Do not eat a lot of foods that are high in solid fats, added sugars, or sodium. Maintain a healthy weight Body mass index (BMI) is used to identify weight problems. It estimates body fat based on height and weight. Your health care provider can help determine your BMI and help you achieve or maintain a healthy weight. Get regular exercise Get regular exercise. This is one of the most important things you can do for your health. Most adults should:  Exercise for at least 150 minutes each week. The exercise should increase your heart rate and make you sweat (moderate-intensity exercise).  Do strengthening exercises at least twice a week. This is in addition to the moderate-intensity exercise.  Spend less time sitting. Even light physical activity can be beneficial. Watch cholesterol and blood lipids Have your blood tested for lipids and cholesterol at 63 years of age, then have this test every 5 years. Have your cholesterol levels checked more often if:  Your lipid or cholesterol levels are high.  You are older than 63 years of age.  You are at high risk for heart disease. What should I know about cancer screening? Depending on your health history and family history, you may need to have cancer screening at various ages. This may include screening for:  Breast cancer.  Cervical cancer.  Colorectal cancer.  Skin cancer.  Lung cancer. What should I know about heart disease, diabetes, and high blood pressure? Blood pressure and heart disease  High blood pressure causes heart disease and increases the risk of stroke. This is more likely to develop in people who have high blood pressure readings, are of African descent, or are overweight.  Have your blood pressure checked: ? Every 3-5 years if you are 24-38 years of age. ? Every year if you are 1 years old or older. Diabetes Have regular diabetes screenings.  This checks your fasting blood sugar level. Have the screening done:  Once every three years after age 68 if you are at a normal weight and have a low risk for diabetes.  More often and at a younger age if you are overweight or have a high risk for diabetes. What should I know about preventing infection? Hepatitis B If you have a higher risk for hepatitis B, you should be screened for this virus. Talk with your health care provider to find out if you are at risk for hepatitis B infection. Hepatitis C Testing is recommended for:  Everyone born from [birth year] through 1964.  Anyone with known risk factors for hepatitis C. Sexually transmitted infections (STIs)  Get screened for STIs, including gonorrhea and chlamydia, if: ? You are sexually active and are younger than 63 years of age. ? You are older than 63 years of age and your health care provider tells you that you are at risk for this type of infection. ? Your sexual activity has changed since you were last screened, and you are at increased risk for chlamydia or gonorrhea. Ask your health care provider if you are at risk.  Ask your health care provider about whether you are at high risk for HIV. Your health care provider may recommend a prescription medicine to help prevent HIV infection. If you choose to take medicine to prevent HIV, you should first get tested for HIV. You should then be tested every 3 months for as long as you are taking the medicine. Pregnancy  If you are about to stop having your period (premenopausal) and you may become pregnant, seek counseling before you get pregnant.  Take 400 to 800 micrograms (mcg) of folic acid every day if you become pregnant.  Ask for birth control (contraception) if you want to prevent pregnancy. Osteoporosis and menopause Osteoporosis is a disease in which the bones lose minerals and strength with aging. This can result in bone fractures. If you are 35 years old or older, or if you are at  risk for osteoporosis and fractures, ask your health care provider if you should:  Be screened for bone loss.  Take a calcium or vitamin D supplement to lower your risk of fractures.  Be given hormone replacement therapy (HRT) to treat symptoms of menopause. Follow these instructions at home: Lifestyle  Do not use any products that contain nicotine or tobacco, such as cigarettes, e-cigarettes, and chewing tobacco. If you need help quitting, ask your health care provider.  Do not use street drugs.  Do not share needles.  Ask your health care provider for help if you need support or information about quitting drugs. Alcohol use  Do not drink alcohol if: ? Your health care provider tells you not to drink. ? You are pregnant, may be pregnant, or are planning to become pregnant.  If you drink alcohol: ? Limit how much you use to 0-1 drink a day. ? Limit intake if you are breastfeeding.  Be aware of how much alcohol is in your drink. In the [place] one drink equals one 12 oz bottle of beer (355 mL), one 5 oz glass of wine (148 mL), or one 1 oz glass of hard liquor (44 mL). General instructions  Schedule regular health, dental, and eye exams.  Stay current with your vaccines.  Tell your health care provider if: ? You often feel depressed. ? You have ever been abused or do not feel safe at home. Summary  Adopting a healthy lifestyle and getting preventive care are important in promoting health and wellness.  Follow your health care provider's instructions about healthy diet, exercising, and getting tested or screened for diseases.  Follow your health care provider's instructions on monitoring your cholesterol and blood pressure. This information is not intended to replace advice given to you by your health care provider. Make sure you discuss any questions you have with your health care provider. Document Revised: 03/19/2018 Document Reviewed: 03/19/2018 Elsevier Patient  Education  2020 [employer].

## 2019-07-22 LAB — PAP, TP IMAGING W/ HPV RNA, RFLX HPV TYPE 16,18/45: HPV DNA High Risk: NOT DETECTED

## 2019-08-27 ENCOUNTER — Other Ambulatory Visit: Payer: Self-pay | Admitting: Family Medicine

## 2019-08-27 DIAGNOSIS — I10 Essential (primary) hypertension: Secondary | ICD-10-CM

## 2019-09-17 ENCOUNTER — Other Ambulatory Visit: Payer: Self-pay | Admitting: Family Medicine

## 2019-09-17 DIAGNOSIS — R600 Localized edema: Secondary | ICD-10-CM

## 2019-09-17 DIAGNOSIS — I10 Essential (primary) hypertension: Secondary | ICD-10-CM

## 2019-09-17 NOTE — Telephone Encounter (Signed)
Patient need to schedule an ov for more refills. Cpe due 05/2019

## 2019-09-17 NOTE — Telephone Encounter (Signed)
Rx refilled for 30 days.  

## 2019-10-18 ENCOUNTER — Other Ambulatory Visit: Payer: Self-pay | Admitting: Family Medicine

## 2019-10-18 DIAGNOSIS — I10 Essential (primary) hypertension: Secondary | ICD-10-CM

## 2019-10-18 DIAGNOSIS — R600 Localized edema: Secondary | ICD-10-CM

## 2019-11-27 ENCOUNTER — Telehealth (INDEPENDENT_AMBULATORY_CARE_PROVIDER_SITE_OTHER): Payer: 59 | Admitting: Family Medicine

## 2019-11-27 ENCOUNTER — Encounter: Payer: Self-pay | Admitting: Family Medicine

## 2019-11-27 ENCOUNTER — Other Ambulatory Visit: Payer: Self-pay | Admitting: Family Medicine

## 2019-11-27 ENCOUNTER — Telehealth: Payer: Self-pay | Admitting: Family Medicine

## 2019-11-27 VITALS — Ht 68.0 in

## 2019-11-27 DIAGNOSIS — I10 Essential (primary) hypertension: Secondary | ICD-10-CM

## 2019-11-27 DIAGNOSIS — H6981 Other specified disorders of Eustachian tube, right ear: Secondary | ICD-10-CM

## 2019-11-27 DIAGNOSIS — H8110 Benign paroxysmal vertigo, unspecified ear: Secondary | ICD-10-CM

## 2019-11-27 DIAGNOSIS — J301 Allergic rhinitis due to pollen: Secondary | ICD-10-CM

## 2019-11-27 DIAGNOSIS — R600 Localized edema: Secondary | ICD-10-CM

## 2019-11-27 MED ORDER — FLUTICASONE PROPIONATE 50 MCG/ACT NA SUSP: 1.0000 | Freq: Two times a day (BID) | NASAL | 5 refills | Status: DC | PRN

## 2019-11-27 MED ORDER — PREDNISONE 20 MG PO TABS: 40.0000 mg | ORAL_TABLET | Freq: Every day | ORAL | 0 refills | Status: DC

## 2019-11-27 NOTE — Telephone Encounter (Signed)
Pt called to say she is experiencing congestion and ear pressure which is effecting her vertigo. She wants something called in to the pharmacy  [id number] [address], [address] [place] [address]  Phone:  [phone] Fax:  [phone]   Please advise

## 2019-11-27 NOTE — Telephone Encounter (Signed)
Appt scheduled, thanks!!

## 2019-11-27 NOTE — Telephone Encounter (Signed)
Pt can do 4pm virtually today

## 2019-11-27 NOTE — Progress Notes (Signed)
Virtual Visit via Video Note I connected with Brianna Foster on 11/27/19 by a video enabled telemedicine application and verified that I am speaking with the correct person using two identifiers.  Location patient: Work [facility] [employer] office Persons participating in the virtual visit: patient, provider  I discussed the limitations of evaluation and management by telemedicine and the availability of in person appointments. The patient expressed understanding and agreed to proceed.  Chief Complaint  Patient presents with   ear pressure   congestion   HPI: Brianna Foster is a 64 yo female with hx GERD,seasonal allergies,HTN,OSA,and vertigo c/o over a month of intermittent  Nasal congestion,post nasal drainage,"little" sore throat. These symptoms worse in the morning.  Negative for ageusia and anosmia. No recent sick contact but 2 coworkers were dx'ed with COVID 19 infection. Her office is far from other coworkers and she wears her mask if somebody comes to her office.  A few days ago noted right ear fullness sensation and spinning/dizziness sensation.Temporarely relief with OTC antihistaminic. Negative for earache or drainage.  Hx of vertigo, she takes Meclizine daily as needed.  No fever,chills,headache,body aches,wheezing,  ROS: See pertinent positives and negatives per HPI.  Past Medical History:  Diagnosis Date   GERD (gastroesophageal reflux disease)    Headache(784.0)    Hypertension    OSA (obstructive sleep apnea) 08/28/2017    Past Surgical History:  Procedure Laterality Date   LEFT HEART CATH AND CORONARY ANGIOGRAPHY N/A 07/11/2017   Procedure: LEFT HEART CATH AND CORONARY ANGIOGRAPHY;  Surgeon: Martinique, Peter M, MD;  Location: [facility];  Service: Cardiovascular;  Laterality: N/A;   LEFT HEART CATHETERIZATION WITH CORONARY ANGIOGRAM N/A 05/11/2013   Procedure: LEFT HEART CATHETERIZATION WITH CORONARY ANGIOGRAM;  Surgeon: Burnell Blanks, MD;   Location: [facility];  Service: Cardiovascular;  Laterality: N/A;   neck fusion      Family History  Problem Relation Age of Onset   Lung cancer Mother    Stomach cancer Father    Heart disease Brother        Pacemaker   Esophageal cancer Brother    Breast cancer Sister     Social History   Socioeconomic History   Marital status: Divorced    Spouse name: Not on file   Number of children: 1   Years of education: College   Highest education level: Not on file  Occupational History    Comment: Glass blower/designer  Tobacco Use   Smoking status: Former Smoker    Packs/day: 0.25    Years: 16.00    Pack years: 4.00    Types: Cigarettes    Quit date: 03/09/2018    Years since quitting: 1.7   Smokeless tobacco: Never Used  Vaping Use   Vaping Use: Never used  Substance and Sexual Activity   Alcohol use: Yes    Comment: Occassional Use   Drug use: No   Sexual activity: Not Currently    Birth control/protection: Post-menopausal  Other Topics Concern   Not on file  Social History Narrative   Lives at home alone   Right-handed   Drinks decaf coffee   Social Determinants of Health   Financial Resource Strain:    Difficulty of Paying Living Expenses: Not on file  Food Insecurity:    Worried About Brianna Foster in the Last Year: Not on file   [employer] of Food in the Last Year: Not on file  Transportation Needs:    Lack of  Transportation (Medical): Not on file   Lack of Transportation (Non-Medical): Not on file  Physical Activity:    Days of Exercise per Week: Not on file   Minutes of Exercise per Session: Not on file  Stress:    Feeling of Stress : Not on file  Social Connections:    Frequency of Communication with Friends and Family: Not on file   Frequency of Social Gatherings with Friends and Family: Not on file   Attends Religious Services: Not on file   Active Member of Clubs or Organizations: Not on file   Attends Theatre manager Meetings: Not on file   Marital Status: Not on file  Intimate Partner Violence:    Fear of Current or Ex-Partner: Not on file   Emotionally Abused: Not on file   Physically Abused: Not on file   Sexually Abused: Not on file    Current Outpatient Medications:    acetaminophen (TYLENOL) 500 MG tablet, Take 500 mg by mouth every 6 (six) hours as needed for mild pain., Disp: , Rfl:    albuterol (VENTOLIN HFA) 108 (90 Base) MCG/ACT inhaler, Inhale 2 puffs into the lungs every 6 (six) hours as needed for wheezing or shortness of breath., Disp: 6.7 g, Rfl: 5   amLODipine (NORVASC) 5 MG tablet, TAKE 1 TABLET BY MOUTH EVERY DAY, Disp: 30 tablet, Rfl: 2   atorvastatin (LIPITOR) 20 MG tablet, Take 1 tablet (20 mg total) by mouth daily., Disp: 90 tablet, Rfl: 1   Blood Pressure Monitoring (BLOOD PRESSURE MONITOR AUTOMAT) DEVI, 1 Device by Does not apply route daily., Disp: 1 Device, Rfl: 0   cyclobenzaprine (FLEXERIL) 10 MG tablet, Take 1 tablet (10 mg total) by mouth 3 (three) times daily as needed for muscle spasms., Disp: 30 tablet, Rfl: 0   famotidine (PEPCID) 40 MG tablet, Take 1 tablet (40 mg total) by mouth at bedtime., Disp: 90 tablet, Rfl: 0   fluticasone (FLONASE) 50 MCG/ACT nasal spray, Place 1 spray into both nostrils 2 (two) times daily as needed for allergies or rhinitis., Disp: 16 g, Rfl: 5   hydrochlorothiazide (HYDRODIURIL) 25 MG tablet, TAKE 1 TABLET BY MOUTH EVERY DAY, Disp: 30 tablet, Rfl: 2   ibuprofen (ADVIL) 600 MG tablet, Take 1 tablet (600 mg total) by mouth every 8 (eight) hours as needed for moderate pain., Disp: 60 tablet, Rfl: 1   loperamide (IMODIUM) 2 MG capsule, Take 1 capsule (2 mg total) by mouth 4 (four) times daily as needed for diarrhea or loose stools., Disp: 12 capsule, Rfl: 0   losartan-hydrochlorothiazide (HYZAAR) 50-12.5 MG tablet, Take 1 tablet by mouth daily., Disp: 90 tablet, Rfl: 2   meclizine (ANTIVERT) 25 MG tablet, Take 1  tablet (25 mg total) by mouth 3 (three) times daily as needed for dizziness., Disp: 30 tablet, Rfl: 2   metoprolol tartrate (LOPRESSOR) 25 MG tablet, TAKE 0.5 TABLETS (12.5 MG TOTAL) BY MOUTH 2 (TWO) TIMES DAILY., Disp: 90 tablet, Rfl: 1   pantoprazole (PROTONIX) 40 MG tablet, Take 1 tablet (40 mg total) by mouth 2 (two) times daily. Take 30 to 60 minutes before a meal, Disp: 60 tablet, Rfl: 5   predniSONE (DELTASONE) 20 MG tablet, Take 2 tablets (40 mg total) by mouth daily with breakfast for 3 days., Disp: 6 tablet, Rfl: 0   sucralfate (CARAFATE) 1 GM/10ML suspension, Take 10 mLs (1 g total) by mouth every 6 (six) hours as needed., Disp: 420 mL, Rfl: 3   traMADol (ULTRAM) 50  MG tablet, Take by mouth every 6 (six) hours as needed., Disp: , Rfl:    triamcinolone cream (KENALOG) 0.1 %, Apply 1 application topically 2 (two) times daily as needed. (Patient taking differently: Apply 1 application topically 2 (two) times daily as needed (to affected sites- for itching). ), Disp: 45 g, Rfl: 1   valACYclovir (VALTREX) 500 MG tablet, 1 TAB TWICE DAILY FOR 3 DAYS WITH OUTBREAKS, START WITHIN 48 HOURS AFTER ONSET. (Patient taking differently: Take 500 mg by mouth 2 (two) times daily. FOR 3 DAYS WITH OUTBREAKS, START WITHIN 48 HOURS AFTER ONSET.), Disp: 18 tablet, Rfl: 1  EXAM:  VITALS per patient if applicable:Ht 5\' 8"  (1.727 m)    LMP 01/07/2010 (LMP Unknown)    BMI 37.71 kg/m   GENERAL: alert, oriented, appears well and in no acute distress  HEENT: atraumatic, conjunctiva clear, no obvious abnormalities on inspection of external nose and ears No tenderness upon pressing maxillary and frontal sinuses.  NECK: normal movements of the head and neck  LUNGS: on inspection no signs of respiratory distress, breathing rate appears normal, no obvious gross SOB, gasping or wheezing  CV: no obvious cyanosis  Brianna: moves all visible extremities without noticeable abnormality  PSYCH/NEURO: pleasant and  cooperative, no obvious depression or anxiety, speech and thought processing grossly intact  ASSESSMENT AND PLAN:  Discussed the following assessment and plan:  Seasonal allergic rhinitis due to pollen - Plan: predniSONE (DELTASONE) 20 MG tablet, fluticasone (FLONASE) 50 MCG/ACT nasal spray Hx does not suggest an infectious process. Symptomatic treatment with nasal saline irrigations as needed. Resume Flonase nasal spray. Short course of Prednisone has helped in the past, instructed to take it with food. Monitor for worsening problem or new symptoms.  Eustachian tube dysfunction, right - Plan: predniSONE (DELTASONE) 20 MG tablet Because hx of HTN, I do not recommend decongestants. Auto inflation maneuvers a few times during the day. Prednisone and plain mucinex may help.   Benign paroxysmal positional vertigo, unspecified laterality Continue Meclizine 25 mg daily as needed. Fall precautions.  I discussed the assessment and treatment plan with the patient. Brianna Foster was provided an opportunity to ask questions and all were answered. She agreed with the plan and demonstrated an understanding of the instructions.   Return if symptoms worsen or fail to improve.  Chayse Gracey Martinique, MD

## 2019-11-27 NOTE — Telephone Encounter (Signed)
Pt needs appt. Can be virtual & I can add her on for 4:00 this afternoon if she is available.

## 2019-12-01 ENCOUNTER — Telehealth: Payer: Self-pay | Admitting: Family Medicine

## 2019-12-01 NOTE — Telephone Encounter (Signed)
Pt called to say she is feeling better with the amLODipine (NORVASC) 5 MG tablet   And thinks she needs maybe another round. She feels about 80%.  [id number] [address] [place] [address]  Phone:  [phone] Fax:  [phone]    Pleas advise

## 2019-12-02 ENCOUNTER — Other Ambulatory Visit: Payer: Self-pay | Admitting: Family Medicine

## 2019-12-02 DIAGNOSIS — J301 Allergic rhinitis due to pollen: Secondary | ICD-10-CM

## 2019-12-02 DIAGNOSIS — H6981 Other specified disorders of Eustachian tube, right ear: Secondary | ICD-10-CM

## 2019-12-02 NOTE — Telephone Encounter (Signed)
Pt asking for another round of Prednisone. She is feeling better, but not 100% yet.

## 2019-12-02 NOTE — Telephone Encounter (Signed)
Spoke with pt, Rx has been refilled.

## 2019-12-02 NOTE — Telephone Encounter (Signed)
Sent Rx for Prednisone 20 mg to take for 3 more days. Symptoms seem to be allergies. If not better , we need to consider immunologist evaluation. Thanks, BJ

## 2019-12-14 ENCOUNTER — Other Ambulatory Visit: Payer: Self-pay | Admitting: Gastroenterology

## 2020-01-12 ENCOUNTER — Encounter: Payer: Self-pay | Admitting: Adult Health

## 2020-01-12 ENCOUNTER — Telehealth (INDEPENDENT_AMBULATORY_CARE_PROVIDER_SITE_OTHER): Payer: 59 | Admitting: Adult Health

## 2020-01-12 VITALS — Temp 97.5°F

## 2020-01-12 DIAGNOSIS — J069 Acute upper respiratory infection, unspecified: Secondary | ICD-10-CM

## 2020-01-12 NOTE — Progress Notes (Signed)
Virtual Visit via Telephone Note  I connected with Brianna Foster on 01/12/20 at  4:30 PM EDT by telephone and verified that I am speaking with the correct person using two identifiers.   I discussed the limitations, risks, security and privacy concerns of performing an evaluation and management service by telephone and the availability of in person appointments. I also discussed with the patient that there may be a patient responsible charge related to this service. The patient expressed understanding and agreed to proceed.  Location patient: home Location provider: work or home office Participants present for the call: patient, provider Patient did not have a visit in the prior 7 days to address this/these issue(s).   History of Present Illness:   64 year old female who  has a past medical history of GERD (gastroesophageal reflux disease), Headache(784.0), Hypertension, and OSA (obstructive sleep apnea) (08/28/2017).  67-year-old female who is being evaluated today for an acute issue.  Her symptoms have been present for roughly 2 days.  Symptoms include nasal congestion, runny nose with clear drainage, chills( resolved), feeling congested in her ears, and a semiproductive cough that is worse in the morning.  She denies fevers, loss of taste or smell, drainage from her ears, or feeling acutely ill.  She has been using Mucinex   She was tested for Covid when her symptoms presented and her test came back negative  Orts that her symptoms have improved over the last 24 hours Observations/Objective: Patient sounds cheerful and well on the phone. I do not appreciate any SOB. Speech and thought processing are grossly intact. Patient reported vitals:  Assessment and Plan: 1. Viral upper respiratory infection -Likely viral or seasonal allergy.  Her symptoms are improving over the last 24 hours.  I do not see a need for antibiotics at this time.  She was advised to start using her prescribed  Flonase.  Follow-up if not resolved in the next 4 to 5 days.  Follow Up Instructions:   I did not refer this patient for an OV in the next 24 hours for this/these issue(s).  I discussed the assessment and treatment plan with the patient. The patient was provided an opportunity to ask questions and all were answered. The patient agreed with the plan and demonstrated an understanding of the instructions.   The patient was advised to call back or seek an in-person evaluation if the symptoms worsen or if the condition fails to improve as anticipated.  I provided 15  minutes of non-face-to-face time during this encounter.   Dorothyann Peng, NP

## 2020-01-13 ENCOUNTER — Encounter: Payer: Self-pay | Admitting: Family Medicine

## 2020-01-13 ENCOUNTER — Telehealth: Payer: Self-pay | Admitting: Family Medicine

## 2020-01-13 ENCOUNTER — Other Ambulatory Visit: Payer: Self-pay

## 2020-01-13 ENCOUNTER — Ambulatory Visit (INDEPENDENT_AMBULATORY_CARE_PROVIDER_SITE_OTHER): Payer: 59 | Admitting: Family Medicine

## 2020-01-13 VITALS — BP 140/82 | HR 57 | Temp 98.2°F | Ht 68.0 in | Wt 245.4 lb

## 2020-01-13 DIAGNOSIS — J40 Bronchitis, not specified as acute or chronic: Secondary | ICD-10-CM

## 2020-01-13 MED ORDER — ALBUTEROL SULFATE HFA 108 (90 BASE) MCG/ACT IN AERS: 2.0000 | INHALATION_SPRAY | RESPIRATORY_TRACT | 0 refills | Status: DC | PRN

## 2020-01-13 MED ORDER — AZITHROMYCIN 250 MG PO TABS: ORAL_TABLET | ORAL | 0 refills | Status: DC

## 2020-01-13 NOTE — Progress Notes (Signed)
   Subjective:    Patient ID: Brianna Foster, female    DOB: Jul 23, 1955, 64 y.o.   MRN: [id number]  HPI Here for 5 days of stuffy head, PND, and a dry cough. No fever or chest pain. She had diarrhea for one day but not now. No SOB. No body aches. [facility] She is drinking fluids and taking Mucinex.    Review of Systems  Constitutional: Negative.   HENT: Positive for congestion and postnasal drip. Negative for ear pain and sore throat.   Eyes: Negative.   Respiratory: Positive for cough and wheezing. Negative for shortness of breath.   Cardiovascular: Negative.   Gastrointestinal: Negative.        Objective:   Physical Exam Constitutional:      Appearance: Normal appearance. She is not ill-appearing.  HENT:     Right Ear: Tympanic membrane, ear canal and external ear normal.     Left Ear: Tympanic membrane, ear canal and external ear normal.     Nose: Nose normal.     Mouth/Throat:     Pharynx: Oropharynx is clear.  Eyes:     Conjunctiva/sclera: Conjunctivae normal.  Cardiovascular:     Rate and Rhythm: Normal rate and regular rhythm.     Pulses: Normal pulses.     Heart sounds: Normal heart sounds.  Pulmonary:     Effort: Pulmonary effort is normal.     Breath sounds: Normal breath sounds.  Lymphadenopathy:     Cervical: No cervical adenopathy.  Neurological:     Mental Status: She is alert.           Assessment & Plan:  Bronchitis, treat with a Zpack. Recheck prn. Written out of work yesterday and today.  Alysia Penna, MD

## 2020-01-13 NOTE — Telephone Encounter (Signed)
She can be since she had a negative covid result, but pcp is booked & won't be back until Monday.

## 2020-01-13 NOTE — Telephone Encounter (Signed)
Scheduled patient in office with Dr. Sarajane Jews today at 2:45 PM

## 2020-01-13 NOTE — Telephone Encounter (Signed)
Patient had a virtual appointment yesterday with Tommi Rumps and wants to know if she can come in the office to be seen and have her lungs listened to because when she breathes in and out her right side hurts.  Please advise

## 2020-01-18 ENCOUNTER — Other Ambulatory Visit: Payer: Self-pay

## 2020-01-18 ENCOUNTER — Ambulatory Visit (INDEPENDENT_AMBULATORY_CARE_PROVIDER_SITE_OTHER): Payer: 59 | Admitting: Family Medicine

## 2020-01-18 VITALS — BP 138/78 | HR 53 | Temp 97.9°F | Ht 68.0 in | Wt 247.1 lb

## 2020-01-18 DIAGNOSIS — H8110 Benign paroxysmal vertigo, unspecified ear: Secondary | ICD-10-CM | POA: Diagnosis not present

## 2020-01-18 DIAGNOSIS — M25461 Effusion, right knee: Secondary | ICD-10-CM

## 2020-01-18 DIAGNOSIS — L309 Dermatitis, unspecified: Secondary | ICD-10-CM

## 2020-01-18 MED ORDER — MECLIZINE HCL 25 MG PO TABS: 25.0000 mg | ORAL_TABLET | Freq: Three times a day (TID) | ORAL | 0 refills | Status: DC | PRN

## 2020-01-18 MED ORDER — TRIAMCINOLONE ACETONIDE 0.1 % EX CREA: 1.0000 "application " | TOPICAL_CREAM | Freq: Two times a day (BID) | CUTANEOUS | 1 refills | Status: DC | PRN

## 2020-01-18 MED ORDER — MELOXICAM 15 MG PO TABS: 15.0000 mg | ORAL_TABLET | Freq: Every day | ORAL | 1 refills | Status: DC

## 2020-01-18 NOTE — Progress Notes (Signed)
Established Patient Office Visit  Subjective:  Patient ID: AMIYLAH Foster, female    DOB: 29-Apr-1955  Age: 64 y.o. MRN: [id number]  CC:  Chief Complaint  Patient presents with  . Knee Pain    right knee, walking feels knee gave out on last Friday. taking motrin for the pain, helps a little bit for the pain, denies falls    HPI Brianna Foster presents for right knee pain.  She recalls last Thursday she was going down some steps and felt sharp sudden pain right knee somewhat medially.  This was followed by some edema the next day.  She has not had any sense of instability.  She has pain especially when flexing the knee.  She has tried some ice and Biofreeze along with ibuprofen 600 mg without much improvement.  She is concerned because her job requires a lot of travel with driving a lot for the next week.  She denies any prior history of knee problems.  No redness.  No heat.  No history of gout or pseudogout.  Patient also has history of eczema and intermittent vertigo.  She is requesting refills of meclizine which she uses as needed for vertigo and triamcinolone topical.  Past Medical History:  Diagnosis Date  . GERD (gastroesophageal reflux disease)   . Headache(784.0)   . Hypertension   . OSA (obstructive sleep apnea) 08/28/2017    Past Surgical History:  Procedure Laterality Date  . LEFT HEART CATH AND CORONARY ANGIOGRAPHY N/A 07/11/2017   Procedure: LEFT HEART CATH AND CORONARY ANGIOGRAPHY;  Surgeon: Martinique, Peter M, MD;  Location: [facility];  Service: Cardiovascular;  Laterality: N/A;  . LEFT HEART CATHETERIZATION WITH CORONARY ANGIOGRAM N/A 05/11/2013   Procedure: LEFT HEART CATHETERIZATION WITH CORONARY ANGIOGRAM;  Surgeon: Burnell Blanks, MD;  Location: [facility];  Service: Cardiovascular;  Laterality: N/A;  . neck fusion      Family History  Problem Relation Age of Onset  . Lung cancer Mother   . Stomach cancer Father   . Heart disease Brother         Pacemaker  . Esophageal cancer Brother   . Breast cancer Sister     Social History   Socioeconomic History  . Marital status: Divorced    Spouse name: Not on file  . Number of children: 1  . Years of education: College  . Highest education level: Not on file  Occupational History    Comment: Collection [employer]  Tobacco Use  . Smoking status: Former Smoker    Packs/day: 0.25    Years: 16.00    Pack years: 4.00    Types: Cigarettes    Quit date: 03/09/2018    Years since quitting: 1.8  . Smokeless tobacco: Never Used  Vaping Use  . Vaping Use: Never used  Substance and Sexual Activity  . Alcohol use: Yes    Comment: Occassional Use  . Drug use: No  . Sexual activity: Not Currently    Birth control/protection: Post-menopausal  Other Topics Concern  . Not on file  Social History Narrative   Lives at home alone   Right-handed   Drinks decaf coffee   Social Determinants of Health   Financial Resource Strain:   . Difficulty of Paying Living Expenses: Not on file  Food Insecurity:   . Worried About Charity fundraiser in the Last Year: Not on file  . Ran Out of Food in the Last Year: Not on file  Transportation Needs:   . Film/video editor (Medical): Not on file  . Lack of Transportation (Non-Medical): Not on file  Physical Activity:   . Days of Exercise per Week: Not on file  . Minutes of Exercise per Session: Not on file  Stress:   . Feeling of Stress : Not on file  Social Connections:   . Frequency of Communication with Friends and Family: Not on file  . Frequency of Social Gatherings with Friends and Family: Not on file  . Attends Religious Services: Not on file  . Active Member of Clubs or Organizations: Not on file  . Attends Archivist Meetings: Not on file  . Marital Status: Not on file  Intimate Partner Violence:   . Fear of Current or Ex-Partner: Not on file  . Emotionally Abused: Not on file  . Physically Abused: Not on file  .  Sexually Abused: Not on file    Outpatient Medications Prior to Visit  Medication Sig Dispense Refill  . albuterol (VENTOLIN HFA) 108 (90 Base) MCG/ACT inhaler Inhale 2 puffs into the lungs every 4 (four) hours as needed for wheezing or shortness of breath. 18 g 0  . amLODipine (NORVASC) 5 MG tablet TAKE 1 TABLET BY MOUTH EVERY DAY 30 tablet 2  . atorvastatin (LIPITOR) 20 MG tablet Take 1 tablet (20 mg total) by mouth daily. 90 tablet 1  . azithromycin (ZITHROMAX Z-PAK) 250 MG tablet As directed 6 each 0  . Blood Pressure Monitoring (BLOOD PRESSURE MONITOR AUTOMAT) DEVI 1 Device by Does not apply route daily. 1 Device 0  . famotidine (PEPCID) 40 MG tablet Take 1 tablet (40 mg total) by mouth at bedtime. 90 tablet 0  . fluticasone (FLONASE) 50 MCG/ACT nasal spray Place 1 spray into both nostrils 2 (two) times daily as needed for allergies or rhinitis. 16 g 5  . hydrochlorothiazide (HYDRODIURIL) 25 MG tablet TAKE 1 TABLET BY MOUTH EVERY DAY 30 tablet 2  . ibuprofen (ADVIL) 600 MG tablet Take 1 tablet (600 mg total) by mouth every 8 (eight) hours as needed for moderate pain. 60 tablet 1  . losartan-hydrochlorothiazide (HYZAAR) 50-12.5 MG tablet Take 1 tablet by mouth daily. 90 tablet 2  . metoprolol tartrate (LOPRESSOR) 25 MG tablet TAKE 0.5 TABLETS (12.5 MG TOTAL) BY MOUTH 2 (TWO) TIMES DAILY. 90 tablet 1  . sucralfate (CARAFATE) 1 GM/10ML suspension Take 10 mLs (1 g total) by mouth every 6 (six) hours as needed. 420 mL 3  . valACYclovir (VALTREX) 500 MG tablet 1 TAB TWICE DAILY FOR 3 DAYS WITH OUTBREAKS, START WITHIN 48 HOURS AFTER ONSET. (Patient taking differently: Take 500 mg by mouth 2 (two) times daily. FOR 3 DAYS WITH OUTBREAKS, START WITHIN 48 HOURS AFTER ONSET.) 18 tablet 1  . cyclobenzaprine (FLEXERIL) 10 MG tablet Take 1 tablet (10 mg total) by mouth 3 (three) times daily as needed for muscle spasms. 30 tablet 0  . meclizine (ANTIVERT) 25 MG tablet Take 1 tablet (25 mg total) by mouth 3  (three) times daily as needed for dizziness. 30 tablet 2  . traMADol (ULTRAM) 50 MG tablet Take by mouth every 6 (six) hours as needed.    . triamcinolone cream (KENALOG) 0.1 % Apply 1 application topically 2 (two) times daily as needed. (Patient taking differently: Apply 1 application topically 2 (two) times daily as needed (to affected sites- for itching). ) 45 g 1  . pantoprazole (PROTONIX) 40 MG tablet Take 1 tablet (40 mg total) by  mouth 2 (two) times daily before a meal. Please schedule a yearly follow up for further refills. Thank you 60 tablet 1  . acetaminophen (TYLENOL) 500 MG tablet Take 500 mg by mouth every 6 (six) hours as needed for mild pain.    [name] loperamide (IMODIUM) 2 MG capsule Take 1 capsule (2 mg total) by mouth 4 (four) times daily as needed for diarrhea or loose stools. 12 capsule 0   No facility-administered medications prior to visit.    Allergies  Allergen Reactions  . Other Anaphylaxis, Shortness Of Breath and Swelling    [place] NUTS    ROS Review of Systems  Constitutional: Negative for chills and fever.  Neurological: Negative for weakness.      Objective:    Physical Exam Vitals reviewed.  Constitutional:      Appearance: Normal appearance.  Cardiovascular:     Rate and Rhythm: Normal rate and regular rhythm.  Musculoskeletal:     Comments: Right knee reveals no warmth.  She has small to medium sized effusion.  No pain with flexion and extension.  She has some mild medial joint line tenderness.  No lateral tenderness.  Ligament testing is normal.  Neurological:     Mental Status: She is alert.     BP 138/78   Pulse (!) 53   Temp 97.9 F (36.6 C)   Ht 5\' 8"  (1.727 m)   Wt 247 lb 1.6 oz (112.1 kg)   LMP 01/07/2010 (LMP Unknown)   SpO2 94%   BMI 37.57 kg/m  Wt Readings from Last 3 Encounters:  01/18/20 247 lb 1.6 oz (112.1 kg)  01/13/20 245 lb 6.4 oz (111.3 kg)  07/20/19 248 lb (112.5 kg)     There are no preventive care reminders to  display for this patient.  There are no preventive care reminders to display for this patient.  Lab Results  Component Value Date   TSH 2.03 12/24/2018   Lab Results  Component Value Date   WBC 6.2 04/20/2019   HGB 14.5 04/20/2019   HCT 43.6 04/20/2019   MCV 92.2 04/20/2019   PLT 320 04/20/2019   Lab Results  Component Value Date   NA 139 04/20/2019   K 3.8 04/20/2019   CO2 25 04/20/2019   GLUCOSE 103 (H) 04/20/2019   BUN 13 04/20/2019   CREATININE 0.91 04/20/2019   BILITOT 0.5 07/11/2017   ALKPHOS 73 07/11/2017   AST 27 07/11/2017   ALT 25 07/11/2017   PROT 8.0 07/11/2017   ALBUMIN 4.0 07/11/2017   CALCIUM 9.6 04/20/2019   ANIONGAP 11 04/20/2019   GFR 94.80 12/24/2018   Lab Results  Component Value Date   CHOL 142 07/12/2017   Lab Results  Component Value Date   HDL 42 07/12/2017   Lab Results  Component Value Date   LDLCALC 77 07/12/2017   Lab Results  Component Value Date   TRIG 113 07/12/2017   Lab Results  Component Value Date   CHOLHDL 3.4 07/12/2017   No results found for: HGBA1C    Assessment & Plan:   #1 right knee pain with effusion.  Suspect probable medial meniscal injury.  -Continued icing 2-3 times daily -Meloxicam 15 mg once daily -Consider elastic knee sleeve for some additional support and compression -Not improving over the next few weeks consider sports medicine referral  #2 history of eczema -Refill triamcinolone cream  #3 history of vertigo -Patient requesting refills of meclizine 25 mg every 8 hours as needed for  vertigo symptoms  Meds ordered this encounter  Medications  . triamcinolone cream (KENALOG) 0.1 %    Sig: Apply 1 application topically 2 (two) times daily as needed.    Dispense:  45 g    Refill:  1  . meclizine (ANTIVERT) 25 MG tablet    Sig: Take 1 tablet (25 mg total) by mouth 3 (three) times daily as needed for dizziness.    Dispense:  30 tablet    Refill:  0  . meloxicam (MOBIC) 15 MG tablet     Sig: Take 1 tablet (15 mg total) by mouth daily.    Dispense:  30 tablet    Refill:  1    Follow-up: No follow-ups on file.    Carolann Littler, MD

## 2020-01-18 NOTE — Patient Instructions (Addendum)
Meniscus Tear  A meniscus tear is a knee injury that happens when a piece of the meniscus is torn. The meniscus is a thick, rubbery, wedge-shaped cartilage in the knee. Two menisci are located in each knee. They sit between the upper bone (femur) and lower bone (tibia) that make up the knee joint. Each meniscus acts as a shock absorber for the knee. A torn meniscus is one of the most common types of knee injuries. This injury can range from mild to severe. Surgery may be needed to repair a severe tear. What are the causes? This condition may be caused by any kneeling, squatting, twisting, or pivoting movement. Sports-related injuries are the most common cause. These often occur from:  Running and stopping suddenly. ? Changing direction. ? Being tackled or knocked off your feet.  Lifting or carrying heavy weights. As people get older, their menisci get thinner and weaker. In these people, tears can happen more easily, such as from climbing stairs. What increases the risk? You are more likely to develop this condition if you:  Play contact sports.  Have a job that requires kneeling or squatting.  Are female.  Are over 40 years old. What are the signs or symptoms? Symptoms of this condition include:  Knee pain, especially at the side of the knee joint. You may feel pain when the injury occurs, or you may only hear a pop and feel pain later.  A feeling that your knee is clicking, catching, locking, or giving way.  Not being able to fully bend or extend your knee.  Bruising or swelling in your knee. How is this diagnosed? This condition may be diagnosed based on your symptoms and a physical exam. You may also have tests, such as:  X-rays.  MRI.  A procedure to look inside your knee with a narrow surgical telescope (arthroscopy). You may be referred to a knee specialist (orthopedic surgeon). How is this treated? Treatment for this injury depends on the severity of the tear.  Treatment for a mild tear may include:  Rest.  Medicine to reduce pain and swelling. This is usually a nonsteroidal anti-inflammatory drug (NSAID), like ibuprofen.  A knee brace, sleeve, or wrap.  Using crutches or a walker to keep weight off your knee and to help you walk.  Exercises to strengthen your knee (physical therapy). You may need surgery if you have a severe tear or if other treatments are not working. Follow these instructions at home: If you have a brace, sleeve, or wrap:  Wear it as told by your health care provider. Remove it only as told by your health care provider.  Loosen the brace, sleeve, or wrap if your toes tingle, become numb, or turn cold and blue.  Keep the brace, sleeve, or wrap clean and dry.  If the brace, sleeve, or wrap is not waterproof: ? Do not let it get wet. ? Cover it with a watertight covering when you take a bath or shower. Managing pain and swelling   Take over-the-counter and prescription medicines only as told by your health care provider.  If directed, put ice on your knee: ? If you have a removable brace, sleeve, or wrap, remove it as told by your health care provider. ? Put ice in a plastic bag. ? Place a towel between your skin and the bag. ? Leave the ice on for 20 minutes, 2-3 times per day.  Move your toes often to avoid stiffness and to lessen swelling.  Raise (  elevate) the injured area above the level of your heart while you are sitting or lying down. Activity  Do not use the injured limb to support your body weight until your health care provider says that you can. Use crutches or a walker as told by your health care provider.  Return to your normal activities as told by your health care provider. Ask your health care provider what activities are safe for you.  Perform range-of-motion exercises only as told by your health care provider.  Begin doing exercises to strengthen your knee and leg muscles only as told by your  health care provider. After you recover, your health care provider may recommend these exercises to help prevent another injury. General instructions  Use a knee brace, sleeve, or wrap as told by your health care provider.  Ask your health care provider when it is safe to drive if you have a brace, sleeve, or wrap on your knee.  Do not use any products that contain nicotine or tobacco, such as cigarettes, e-cigarettes, and chewing tobacco. If you need help quitting, ask your health care provider.  Ask your health care provider if the medicine prescribed to you: ? Requires you to avoid driving or using heavy machinery. ? Can cause constipation. You may need to take these actions to prevent or treat constipation:  Drink enough fluid to keep your urine pale yellow.  Take over-the-counter or prescription medicines.  Eat foods that are high in fiber, such as beans, whole grains, and fresh fruits and vegetables.  Limit foods that are high in fat and processed sugars, such as fried or sweet foods.  Keep all follow-up visits as told by your health care provider. This is important. Contact a health care provider if:  You have a fever.  Your knee becomes red, tender, or swollen.  Your pain medicine is not helping.  Your symptoms get worse or do not improve after 2 weeks of home care. Summary  A meniscus tear is a knee injury that happens when a piece of the meniscus is torn.  Treatment for this injury depends on the severity of the tear. You may need surgery if you have a severe tear or if other treatments are not working.  Rest, ice, and raise (elevate) your injured knee as told by your health care provider. This will help lessen pain and swelling.  Contact a health care provider if you have new symptoms, or your symptoms get worse or do not improve after 2 weeks of home care.  Keep all follow-up visits as told by your health care provider. This is important. This information is not  intended to replace advice given to you by your health care provider. Make sure you discuss any questions you have with your health care provider. Document Revised: 10/08/2017 Document Reviewed: 10/08/2017 Elsevier Patient Education  2020 [employer]  

## 2020-01-22 ENCOUNTER — Ambulatory Visit: Payer: 59 | Admitting: Family Medicine

## 2020-02-19 ENCOUNTER — Other Ambulatory Visit: Payer: Self-pay | Admitting: Family Medicine

## 2020-02-19 DIAGNOSIS — I10 Essential (primary) hypertension: Secondary | ICD-10-CM

## 2020-02-19 DIAGNOSIS — R600 Localized edema: Secondary | ICD-10-CM

## 2020-02-22 ENCOUNTER — Other Ambulatory Visit: Payer: Self-pay | Admitting: Family Medicine

## 2020-02-22 DIAGNOSIS — I10 Essential (primary) hypertension: Secondary | ICD-10-CM

## 2020-03-01 NOTE — Progress Notes (Signed)
Subjective:    CC: R knee pain  I, Brianna Foster, LAT, ATC, am serving as scribe for Dr. Lynne Leader.  HPI: Pt is a 64 y/o female presenting w/ c/o R knee pain since mid October 2021 that began when she was walking downstairs and felt a sharp, sudden pain in her knee with her knee giving-way.  She locates her pain to her R ant-med knee.  R knee swelling: yes R knee mechanical symptoms: yes locking and catching R knee instability: yes Aggravating factors: walking/weight bearing; full R knee flexion and ext AROM Treatments tried: ice, Biofreeze, IBU  Pertinent review of Systems: no fever or chills  Relevant historical information: Angina, OSA   Objective:    Vitals:   03/02/20 0757  BP: 140/80  Pulse: 65  SpO2: 95%   General: Well Developed, well nourished, and in no acute distress.   MSK: right knee moderate effusion otherwise normal-appearing Range of motion 0-100 degrees with crepitation. Tender palpation medial joint line. Stable given his exam. Positive medial McMurray's test. Mild guarding with strength testing decreased quad strength  Lab and Radiology Results Xray images right knee obtained today personally and independently interpreted. Medial and patellofemoral DJD moderate.  No fracture visible.  Osteophyte superior patellar pole Await formal radiology review  Procedure: Real-time Ultrasound Guided Injection of right knee superior lateral patellar space Device: Philips Affiniti 50G Images permanently stored and available for review in PACS Ultrasound examination of knee prior to injection reveals moderate effusion.  Very narrowed medial joint line with degenerative appearing medial meniscus.  No Baker's cyst. Verbal informed consent obtained.  Discussed risks and benefits of procedure. Warned about infection bleeding damage to structures skin hypopigmentation and fat atrophy among others. Patient expresses understanding and agreement Time-out  conducted.   Noted no overlying erythema, induration, or other signs of local infection.   Skin prepped in a sterile fashion.   Local anesthesia: Topical Ethyl chloride.   With sterile technique and under real time ultrasound guidance:  40 mg of Kenalog and 2 mL of Marcaine injected into the joint. Fluid seen entering the joint capsule.   Completed without difficulty   Pain immediately resolved suggesting accurate placement of the medication.   Advised to call if fevers/chills, erythema, induration, drainage, or persistent bleeding.   Images permanently stored and available for review in the ultrasound unit.  Impression: Technically successful ultrasound guided injection.       Impression and Recommendations:    Assessment and Plan: 64 y.o. female with right knee pain with mechanical symptoms occurring about a month ago.  Concerning for meniscus tear.  Plan for injection today as well as x-ray.  Also recommend Voltaren gel and compressive knee sleeve if needed.  If not improving or if injection does not last very long next step would be MRI for surgical planning especially considering her significant mechanical symptoms.Brianna Foster  PDMP not reviewed this encounter. Orders Placed This Encounter  Procedures  . [place] LIMITED JOINT SPACE STRUCTURES LOW RIGHT(NO LINKED CHARGES)    Order Specific Question:   Reason for Exam (SYMPTOM  OR DIAGNOSIS REQUIRED)    Answer:   R knee pain    Order Specific Question:   Preferred imaging location?    Answer:   [place]  . DG Knee AP/LAT W/Sun[place] Right    Standing Status:   Future    Number of Occurrences:   1    Standing Expiration Date:   04/01/2020  Order Specific Question:   Reason for Exam (SYMPTOM  OR DIAGNOSIS REQUIRED)    Answer:   R knee pain    Order Specific Question:   Preferred imaging location?    Answer:   Brianna Foster   No orders of the defined types were placed in this encounter.   Discussed warning  signs or symptoms. Please see discharge instructions. Patient expresses understanding.   The above documentation has been reviewed and is accurate and complete Lynne Leader, M.D.

## 2020-03-02 ENCOUNTER — Encounter: Payer: Self-pay | Admitting: Family Medicine

## 2020-03-02 ENCOUNTER — Other Ambulatory Visit: Payer: Self-pay

## 2020-03-02 ENCOUNTER — Ambulatory Visit: Payer: Self-pay

## 2020-03-02 ENCOUNTER — Ambulatory Visit (INDEPENDENT_AMBULATORY_CARE_PROVIDER_SITE_OTHER): Payer: 59

## 2020-03-02 ENCOUNTER — Ambulatory Visit (INDEPENDENT_AMBULATORY_CARE_PROVIDER_SITE_OTHER): Payer: 59 | Admitting: Family Medicine

## 2020-03-02 VITALS — BP 140/80 | HR 65 | Ht 68.0 in | Wt 252.8 lb

## 2020-03-02 DIAGNOSIS — M25561 Pain in right knee: Secondary | ICD-10-CM

## 2020-03-02 NOTE — Progress Notes (Signed)
Mild arthritis of the right knee present on x-ray.

## 2020-03-02 NOTE — Patient Instructions (Addendum)
You had a R knee injection today. Call or go to the ER if you develop a large red swollen joint with extreme pain or oozing puss.   Please get an Xray today before you leave  Please use voltaren gel up to 4x daily for pain as needed.    Meniscus Tear  A meniscus tear is a knee injury that happens when a piece of the meniscus is torn. The meniscus is a thick, rubbery, wedge-shaped cartilage in the knee. Two menisci are located in each knee. They sit between the upper bone (femur) and lower bone (tibia) that make up the knee joint. Each meniscus acts as a shock absorber for the knee. A torn meniscus is one of the most common types of knee injuries. This injury can range from mild to severe. Surgery may be needed to repair a severe tear. What are the causes? This condition may be caused by any kneeling, squatting, twisting, or pivoting movement. Sports-related injuries are the most common cause. These often occur from:  Running and stopping suddenly. ? Changing direction. ? Being tackled or knocked off your feet.  Lifting or carrying heavy weights. As people get older, their menisci get thinner and weaker. In these people, tears can happen more easily, such as from climbing stairs. What increases the risk? You are more likely to develop this condition if you:  Play contact sports.  Have a job that requires kneeling or squatting.  Are female.  Are over 5 years old. What are the signs or symptoms? Symptoms of this condition include:  Knee pain, especially at the side of the knee joint. You may feel pain when the injury occurs, or you may only hear a pop and feel pain later.  A feeling that your knee is clicking, catching, locking, or giving way.  Not being able to fully bend or extend your knee.  Bruising or swelling in your knee. How is this diagnosed? This condition may be diagnosed based on your symptoms and a physical exam. You may also have tests, such  as:  X-rays.  MRI.  A procedure to look inside your knee with a narrow surgical telescope (arthroscopy). You may be referred to a knee specialist (orthopedic surgeon). How is this treated? Treatment for this injury depends on the severity of the tear. Treatment for a mild tear may include:  Rest.  Medicine to reduce pain and swelling. This is usually a nonsteroidal anti-inflammatory drug (NSAID), like ibuprofen.  A knee brace, sleeve, or wrap.  Using crutches or a walker to keep weight off your knee and to help you walk.  Exercises to strengthen your knee (physical therapy). You may need surgery if you have a severe tear or if other treatments are not working. Follow these instructions at home: If you have a brace, sleeve, or wrap:  Wear it as told by your health care provider. Remove it only as told by your health care provider.  Loosen the brace, sleeve, or wrap if your toes tingle, become numb, or turn cold and blue.  Keep the brace, sleeve, or wrap clean and dry.  If the brace, sleeve, or wrap is not waterproof: ? Do not let it get wet. ? Cover it with a watertight covering when you take a bath or shower. Managing pain and swelling   Take over-the-counter and prescription medicines only as told by your health care provider.  If directed, put ice on your knee: ? If you have a removable brace, sleeve, or  wrap, remove it as told by your health care provider. ? Put ice in a plastic bag. ? Place a towel between your skin and the bag. ? Leave the ice on for 20 minutes, 2-3 times per day.  Move your toes often to avoid stiffness and to lessen swelling.  Raise (elevate) the injured area above the level of your heart while you are sitting or lying down. Activity  Do not use the injured limb to support your body weight until your health care provider says that you can. Use crutches or a walker as told by your health care provider.  Return to your normal activities as told  by your health care provider. Ask your health care provider what activities are safe for you.  Perform range-of-motion exercises only as told by your health care provider.  Begin doing exercises to strengthen your knee and leg muscles only as told by your health care provider. After you recover, your health care provider may recommend these exercises to help prevent another injury. General instructions  Use a knee brace, sleeve, or wrap as told by your health care provider.  Ask your health care provider when it is safe to drive if you have a brace, sleeve, or wrap on your knee.  Do not use any products that contain nicotine or tobacco, such as cigarettes, e-cigarettes, and chewing tobacco. If you need help quitting, ask your health care provider.  Ask your health care provider if the medicine prescribed to you: ? Requires you to avoid driving or using heavy machinery. ? Can cause constipation. You may need to take these actions to prevent or treat constipation:  Drink enough fluid to keep your urine pale yellow.  Take over-the-counter or prescription medicines.  Eat foods that are high in fiber, such as beans, whole grains, and fresh fruits and vegetables.  Limit foods that are high in fat and processed sugars, such as fried or sweet foods.  Keep all follow-up visits as told by your health care provider. This is important. Contact a health care provider if:  You have a fever.  Your knee becomes red, tender, or swollen.  Your pain medicine is not helping.  Your symptoms get worse or do not improve after 2 weeks of home care. Summary  A meniscus tear is a knee injury that happens when a piece of the meniscus is torn.  Treatment for this injury depends on the severity of the tear. You may need surgery if you have a severe tear or if other treatments are not working.  Rest, ice, and raise (elevate) your injured knee as told by your health care provider. This will help lessen  pain and swelling.  Contact a health care provider if you have new symptoms, or your symptoms get worse or do not improve after 2 weeks of home care.  Keep all follow-up visits as told by your health care provider. This is important. This information is not intended to replace advice given to you by your health care provider. Make sure you discuss any questions you have with your health care provider. Document Revised: 10/08/2017 Document Reviewed: 10/08/2017 Elsevier Patient Education  [employer].

## 2020-03-03 ENCOUNTER — Other Ambulatory Visit: Payer: Self-pay | Admitting: Family Medicine

## 2020-03-03 DIAGNOSIS — I10 Essential (primary) hypertension: Secondary | ICD-10-CM

## 2020-03-07 ENCOUNTER — Telehealth: Payer: Self-pay | Admitting: Family Medicine

## 2020-03-07 DIAGNOSIS — M25561 Pain in right knee: Secondary | ICD-10-CM

## 2020-03-07 MED ORDER — LORAZEPAM 0.5 MG PO TABS: ORAL_TABLET | ORAL | 0 refills | Status: DC

## 2020-03-07 NOTE — Telephone Encounter (Signed)
Patient called stating that the cortisone injection that she received on Wednesday did not seem to help. She asked if there is anything else she can try? Ibuprofen has not been helping. She would also like to go ahead and have the MRI.  Please advise.

## 2020-03-07 NOTE — Telephone Encounter (Signed)
I have ordered the MRI to the [facility] as that is the fastest location to get an MRI.  If you want in [place] I can change the location.  Please let me know if you need antianxiety medication for the MRI.  As for pain next steps if medicines like ibuprofen or meloxicam are not helping would be opiates like tramadol which have a bunch of problems associated with them.  I would recommend waiting until after MRI is back to proceed with further treatment but if you need tramadol I can prescribe it.

## 2020-03-07 NOTE — Telephone Encounter (Signed)
Ativan sent

## 2020-03-07 NOTE — Telephone Encounter (Signed)
Called pt and relayed info.  She would like anti-anxiety medication for the MRI.  Please prescribe to [facility] on [address].  She'll hold on any Tramadol but let us know if she changes her mind.

## 2020-03-09 DIAGNOSIS — U071 COVID-19: Secondary | ICD-10-CM

## 2020-03-09 HISTORY — DX: COVID-19: U07.1

## 2020-03-12 ENCOUNTER — Other Ambulatory Visit: Payer: Self-pay

## 2020-03-12 ENCOUNTER — Ambulatory Visit (INDEPENDENT_AMBULATORY_CARE_PROVIDER_SITE_OTHER): Payer: 59

## 2020-03-12 DIAGNOSIS — M25561 Pain in right knee: Secondary | ICD-10-CM

## 2020-03-14 NOTE — Progress Notes (Signed)
I, Wendy Poet, LAT, ATC, am serving as scribe for Dr. Lynne Leader.  Brianna Foster is a 64 y.o. female who presents to [place] [facility] today for f/u of R knee pain and R knee MRI review.  She was last seen by Dr. Georgina Snell on 03/02/20 and had a R knee injection.  Since then, pt reports yesterday R knee felt pretty good. Pt locates pain to medial aspect of knee..   Diagnostic imaging: R knee MRI- 03/12/20; R knee XR- 03/02/20  Pertinent review of systems: No fevers or chills  Relevant historical information: Hypertension, sleep apnea   Exam:  BP 136/88 (BP Location: Right Arm, Patient Position: Sitting, Cuff Size: Normal)   Pulse 68   Ht 5\' 8"  (1.727 m)   Wt 251 lb (113.9 kg)   LMP 01/07/2010 (LMP Unknown)   SpO2 97%   BMI 38.16 kg/m  General: Well Developed, well nourished, and in no acute distress.   MSK: Right knee: Normal-appearing Tender palpation medial joint line. Normal motion with crepitation. Slight laxity to MCL stress test. Intact strength.    Lab and Radiology Results No results found for this or any previous visit (from the past 72 hour(s)). MR Knee Right Wo Contrast  Result Date: 03/13/2020 CLINICAL DATA:  Right knee pain for 2 months.  No known injury. EXAM: MRI OF THE RIGHT KNEE WITHOUT CONTRAST TECHNIQUE: Multiplanar, multisequence MR imaging of the knee was performed. No intravenous contrast was administered. COMPARISON:  None. FINDINGS: MENISCI Medial: Intact. Lateral: Intact. LIGAMENTS Cruciates: ACL and PCL are intact. Collaterals: Medial collateral ligament is intact. Lateral collateral ligament complex is intact. CARTILAGE Patellofemoral: Partial-thickness cartilage loss of the medial patellofemoral compartment. Medial: High-grade partial-thickness cartilage loss of the medial femorotibial compartment. Lateral: Partial thickness cartilage loss of the lateral femorotibial compartment with small marginal osteophytes. JOINT: Small  joint effusion. Minimal edema in Hoffa's fat. No plical thickening. POPLITEAL FOSSA: Popliteus tendon is intact. Tiny Baker's cyst. EXTENSOR MECHANISM: Intact quadriceps tendon. Intact patellar tendon. Intact lateral patellar retinaculum. Intact medial patellar retinaculum. Intact MPFL. BONES: Severe bone marrow edema in the medial tibial plateau disproportionate to the degree of cartilage loss concerning for stress reaction without a fracture. No acute fracture or dislocation. No aggressive osseous lesion. Other: No fluid collection or hematoma. Muscles are normal. IMPRESSION: 1. Severe bone marrow edema in the medial tibial plateau disproportionate to the degree of cartilage loss concerning for stress reaction without a fracture. 2. Tricompartmental cartilage abnormalities as described above. Electronically Signed   By: Kathreen Devoid   On: 03/13/2020 11:31  I, Lynne Leader, personally (independently) visualized and performed the interpretation of the images attached in this note.     Assessment and Plan: 64 y.o. female with right knee pain due to stress reaction medial tibial plateau.  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  Discussed options.  Plan for conservative management and offloading.  Relative rest for now.  She would be a good candidate for medial off loader brace but she wants to think about it for now.  Also discussed the possibility of hyaluronic acid injections in the future.  I am doubtful that would help for this particular issue right now but may be helpful for the future.  Also discussed total knee replacement in the future as well.   Discussed warning signs or symptoms. Please see discharge instructions. Patient expresses understanding.   The above documentation has been reviewed and  is accurate and complete Lynne Leader, M.D.   Total encounter time 20 minutes including face-to-face time with the patient and, reviewing past medical record, and  charting on the date of service.   Discussed MRI findings and results and plan

## 2020-03-14 NOTE — Progress Notes (Signed)
MRI knee shows high-grade cartilage loss at the medial compartment of the knee with bone marrow edema.  This is causing her knee pain.  Functionally you have a area of cartilage loss where the bone has been bruised.  Recommend return to clinic to go over the results in further detail.  For now recommend using crutches to keep the weight off the knee.

## 2020-03-15 ENCOUNTER — Other Ambulatory Visit: Payer: Self-pay

## 2020-03-15 ENCOUNTER — Ambulatory Visit (INDEPENDENT_AMBULATORY_CARE_PROVIDER_SITE_OTHER): Payer: 59 | Admitting: Family Medicine

## 2020-03-15 VITALS — BP 136/88 | HR 68 | Ht 68.0 in | Wt 251.0 lb

## 2020-03-15 DIAGNOSIS — M84361A Stress fracture, right tibia, initial encounter for fracture: Secondary | ICD-10-CM | POA: Diagnosis not present

## 2020-03-15 NOTE — Patient Instructions (Signed)
Thank you for coming in today.  You have a stress reaction to the bone in your knee.  Take it easy for a few weeks.  Take Vit D 5000 units daily.  Try using an over the counter knee brace.  If needed I can arrange for the custom medial offloader OA knee brace that we talked about. I think it will help.  Let me know.   Keep me updated.   Tylenol firstline for pain as it is safer.  Voltaren gel is ok.  If not better ok to use ibuprofen.

## 2020-03-21 ENCOUNTER — Other Ambulatory Visit: Payer: Self-pay | Admitting: Family Medicine

## 2020-03-21 DIAGNOSIS — I10 Essential (primary) hypertension: Secondary | ICD-10-CM

## 2020-03-21 DIAGNOSIS — R600 Localized edema: Secondary | ICD-10-CM

## 2020-04-04 ENCOUNTER — Other Ambulatory Visit: Payer: Self-pay

## 2020-04-04 ENCOUNTER — Encounter: Payer: Self-pay | Admitting: Family Medicine

## 2020-04-04 ENCOUNTER — Telehealth (INDEPENDENT_AMBULATORY_CARE_PROVIDER_SITE_OTHER): Payer: 59 | Admitting: Family Medicine

## 2020-04-04 DIAGNOSIS — J019 Acute sinusitis, unspecified: Secondary | ICD-10-CM | POA: Diagnosis not present

## 2020-04-04 MED ORDER — AZITHROMYCIN 250 MG PO TABS: ORAL_TABLET | ORAL | 0 refills | Status: DC

## 2020-04-04 NOTE — Progress Notes (Signed)
   Subjective:    Patient ID: Brianna Foster, female    DOB: 1956/03/19, 64 y.o.   MRN: [id number]  HPI Virtual Visit via Telephone Note  I connected with the patient on 04/04/20 at 10:15 AM EST by telephone and verified that I am speaking with the correct person using two identifiers.   I discussed the limitations, risks, security and privacy concerns of performing an evaluation and management service by telephone and the availability of in person appointments. I also discussed with the patient that there may be a patient responsible charge related to this service. The patient expressed understanding and agreed to proceed.  Location patient: home Location provider: work or home office Participants present for the call: patient, provider Patient did not have a visit in the prior 7 days to address this/these issue(s).   History of Present Illness: Here for 3 days of sinus congestion, headache, and ear pain. No fever or ST or cough. Using Mucinex and Flonase and Theraflu.    Observations/Objective: Patient sounds cheerful and well on the phone. I do not appreciate any SOB. Speech and thought processing are grossly intact. Patient reported vitals:  Assessment and Plan: Sinusitis, treat with a Zpack.  Gershon Crane, MD  Follow Up Instructions:     [phone] 11-20 [phone] 21-30 I did not refer this patient for an OV in the next 24 hours for this/these issue(s).  I discussed the assessment and treatment plan with the patient. The patient was provided an opportunity to ask questions and all were answered. The patient agreed with the plan and demonstrated an understanding of the instructions.   The patient was advised to call back or seek an in-person evaluation if the symptoms worsen or if the condition fails to improve as anticipated.  I provided 12 minutes of non-face-to-face time during this encounter.   Gershon Crane, MD    Review of Systems     Objective:    Physical Exam        Assessment & Plan:

## 2020-04-05 ENCOUNTER — Telehealth: Payer: Self-pay | Admitting: Family Medicine

## 2020-04-05 NOTE — Telephone Encounter (Signed)
Please advise 

## 2020-04-05 NOTE — Telephone Encounter (Signed)
Pt saw Dr. Fry 

## 2020-04-05 NOTE — Telephone Encounter (Signed)
The letter is ready  

## 2020-04-05 NOTE — Telephone Encounter (Signed)
Patient needs a work note for yesterdays visit 12/27 with Dr. Clent Ridges. She started running a fever last night and her job won't let her come to work today till she has a COVID test. She has a COVID test scheduled for 12/29 at 6 PM.  She needs the work note to have the dates 04/04/2020 - 04/11/2020  Pending COVID test  Also needs the doctors signature on the note and sent through [employer]

## 2020-04-06 NOTE — Telephone Encounter (Signed)
I have sent a copy of the work note with Dr. Claris Che signature to the patients MyChart.

## 2020-04-07 ENCOUNTER — Telehealth (INDEPENDENT_AMBULATORY_CARE_PROVIDER_SITE_OTHER): Payer: 59 | Admitting: Family Medicine

## 2020-04-07 DIAGNOSIS — U071 COVID-19: Secondary | ICD-10-CM | POA: Diagnosis not present

## 2020-04-07 MED ORDER — BENZONATATE 100 MG PO CAPS: 100.0000 mg | ORAL_CAPSULE | Freq: Three times a day (TID) | ORAL | 0 refills | Status: DC | PRN

## 2020-04-07 NOTE — Progress Notes (Signed)
Virtual Visit via Telephone Note  I connected with Brianna Foster on 04/07/20 at  6:00 PM EST by telephone and verified that I am speaking with the correct person using two identifiers.   I discussed the limitations, risks, security and privacy concerns of performing an evaluation and management service by telephone and the availability of in person appointments. I also discussed with the patient that there may be a patient responsible charge related to this service. The patient expressed understanding and agreed to proceed.  Location patient: home,  Location provider: work or home office Participants present for the call: patient, provider Patient did not have a visit with me in the prior 7 days to address this/these issue(s).   History of Present Illness:  Acute telemedicine visit for COVID19: -Onset: symptoms started 04/02/20 -someone came to her house sick a few days prior -she tested positive for covid yesterday -Symptoms include: sinus congestion, chills occasionally, mild cough, fever the first 2 days, diarrhea, HA the first few days -Denies: SOB, CP, vomiting, body aches, loss of taste or smell -Pertinent past medical history: HTN,  -Pertinent medication allergies: nkda -COVID-19 vaccine status: fully vaccinated but had not had booster; has not had flu shot   Observations/Objective: Patient sounds cheerful and well on the phone. I do not appreciate any SOB. Speech and thought processing are grossly intact. Patient reported vitals:  Assessment and Plan:  COVID-19  -we discussed possible serious and likely etiologies, options for evaluation and workup, limitations of telemedicine visit vs in person visit, treatment, treatment risks and precautions. Pt prefers to treat via telemedicine empirically rather than in person at this moment.  Discussed treatment options, potential complications, isolation and precautions for COVID-19 illness.  She opted for Tessalon for cough.   Other over-the-counter symptomatic care measures were summarized in patient instructions. Work/School slipped offered: Declined, reports her primary care doctor already provided work note for 10-day isolation Scheduled follow up with PCP offered: Agrees to call back if needed Advised to seek prompt in person care if worsening, new symptoms arise, or if is not improving with treatment. Advised of options for inperson care in case PCP office not available. Did let the patient know that I only do telemedicine shifts for [place] on Tuesdays and Thursdays and advised a follow up visit with PCP or at an [facility] if has further questions or concerns.   Follow Up Instructions:  I did not refer this patient for an OV with me in the next 24 hours for this/these issue(s).  I discussed the assessment and treatment plan with the patient. The patient was provided an opportunity to ask questions and all were answered. The patient agreed with the plan and demonstrated an understanding of the instructions.   I spent 17 minutes on the date of this visit in the care of this patient. See summary of tasks completed to properly care for this patient in the detailed notes above which included review of PMH, medications, allergies, evaluation of the patient and ordering and instructing patient on testing and care options.     Terressa Koyanagi, DO

## 2020-04-07 NOTE — Patient Instructions (Addendum)
°  HOME CARE TIPS:  -I sent the medication(s) we discussed to your pharmacy: Meds ordered this encounter  Medications   benzonatate (TESSALON PERLES) 100 MG capsule    Sig: Take 1 capsule (100 mg total) by mouth 3 (three) times daily as needed.    Dispense:  20 capsule    Refill:  0    -can use tylenol or aleve if needed for fevers, aches and pains per instructions  -Can use Imodium for the diarrhea if needed  -can use nasal saline a few times per day if nasal congestion, sometime a short course of Afrin nasal spray for 3 days can help as well  -stay hydrated, drink plenty of fluids and eat small healthy meals - avoid dairy  -can take 1000 IU (36mcg)Vit D3 and 500mg  Vit C daily per instructions  -If the Covid test is positive, check out the CDC website for more information on home care, transmission and treatment for COVID19  -follow up with your doctor in 2-3 days unless improving and feeling better  -stay home while sick, except to seek medical care, and if you have COVID19 please stay home for a full 10 days since the onset of symptoms PLUS one day of no fever and feeling better.  It was nice to meet you today, and I really hope you are feeling better soon. I help [facility] out with telemedicine visits on Tuesdays and Thursdays and am available for visits on those days. If you have any concerns or questions following this visit please schedule a follow up visit with your Primary Care doctor or seek care at a local urgent care clinic to avoid delays in care.    Seek in person care promptly if your symptoms worsen, new concerns arise or you are not improving with treatment. Call 911 and/or seek emergency care if you symptoms are severe or life threatening.

## 2020-04-08 ENCOUNTER — Other Ambulatory Visit: Payer: Self-pay | Admitting: Family Medicine

## 2020-04-11 ENCOUNTER — Encounter (HOSPITAL_BASED_OUTPATIENT_CLINIC_OR_DEPARTMENT_OTHER): Payer: Self-pay | Admitting: *Deleted

## 2020-04-11 ENCOUNTER — Emergency Department (HOSPITAL_BASED_OUTPATIENT_CLINIC_OR_DEPARTMENT_OTHER)
Admission: EM | Admit: 2020-04-11 | Discharge: 2020-04-11 | Disposition: A | Discharge status: Home / Self Care | Payer: 59 | Attending: Emergency Medicine | Admitting: Emergency Medicine

## 2020-04-11 ENCOUNTER — Emergency Department (HOSPITAL_BASED_OUTPATIENT_CLINIC_OR_DEPARTMENT_OTHER): Payer: 59

## 2020-04-11 ENCOUNTER — Other Ambulatory Visit: Payer: Self-pay

## 2020-04-11 DIAGNOSIS — I10 Essential (primary) hypertension: Secondary | ICD-10-CM | POA: Insufficient documentation

## 2020-04-11 DIAGNOSIS — Z87891 Personal history of nicotine dependence: Secondary | ICD-10-CM | POA: Diagnosis not present

## 2020-04-11 DIAGNOSIS — Z79899 Other long term (current) drug therapy: Secondary | ICD-10-CM | POA: Diagnosis not present

## 2020-04-11 DIAGNOSIS — E876 Hypokalemia: Secondary | ICD-10-CM | POA: Insufficient documentation

## 2020-04-11 DIAGNOSIS — U071 COVID-19: Secondary | ICD-10-CM | POA: Diagnosis not present

## 2020-04-11 DIAGNOSIS — J1282 Pneumonia due to coronavirus disease 2019: Secondary | ICD-10-CM | POA: Insufficient documentation

## 2020-04-11 DIAGNOSIS — R0602 Shortness of breath: Secondary | ICD-10-CM | POA: Diagnosis present

## 2020-04-11 LAB — CBC WITH DIFFERENTIAL/PLATELET
Abs Immature Granulocytes: 0.01 10*3/uL (ref 0.00–0.07)
Basophils Absolute: 0 10*3/uL (ref 0.0–0.1)
Basophils Relative: 0 %
Eosinophils Absolute: 0.1 10*3/uL (ref 0.0–0.5)
Eosinophils Relative: 2 %
HCT: 43.2 % (ref 36.0–46.0)
Hemoglobin: 14.5 g/dL (ref 12.0–15.0)
Immature Granulocytes: 0 %
Lymphocytes Relative: 37 %
Lymphs Abs: 2.2 10*3/uL (ref 0.7–4.0)
MCH: 30.3 pg (ref 26.0–34.0)
MCHC: 33.6 g/dL (ref 30.0–36.0)
MCV: 90.4 fL (ref 80.0–100.0)
Monocytes Absolute: 0.9 10*3/uL (ref 0.1–1.0)
Monocytes Relative: 15 %
Neutro Abs: 2.6 10*3/uL (ref 1.7–7.7)
Neutrophils Relative %: 46 %
Platelets: 307 10*3/uL (ref 150–400)
RBC: 4.78 MIL/uL (ref 3.87–5.11)
RDW: 11.9 % (ref 11.5–15.5)
WBC: 5.8 10*3/uL (ref 4.0–10.5)
nRBC: 0 % (ref 0.0–0.2)

## 2020-04-11 LAB — COMPREHENSIVE METABOLIC PANEL
ALT: 24 U/L (ref 0–44)
AST: 27 U/L (ref 15–41)
Albumin: 4.2 g/dL (ref 3.5–5.0)
Alkaline Phosphatase: 63 U/L (ref 38–126)
Anion gap: 12 (ref 5–15)
BUN: 10 mg/dL (ref 8–23)
CO2: 27 mmol/L (ref 22–32)
Calcium: 9.1 mg/dL (ref 8.9–10.3)
Chloride: 101 mmol/L (ref 98–111)
Creatinine, Ser: 0.84 mg/dL (ref 0.44–1.00)
GFR, Estimated: >60 mL/min (ref >60)
Glucose, Bld: 104 mg/dL — ABNORMAL HIGH (ref 70–99)
Potassium: 3.4 mmol/L — ABNORMAL LOW (ref 3.5–5.1)
Sodium: 140 mmol/L (ref 135–145)
Total Bilirubin: 0.4 mg/dL (ref 0.3–1.2)
Total Protein: 8.4 g/dL — ABNORMAL HIGH (ref 6.5–8.1)

## 2020-04-11 LAB — TROPONIN I (HIGH SENSITIVITY): Troponin I (High Sensitivity): 4 ng/L (ref <18)

## 2020-04-11 MED ORDER — POTASSIUM CHLORIDE CRYS ER 20 MEQ PO TBCR
40.0000 meq | EXTENDED_RELEASE_TABLET | Freq: Once | ORAL | Status: AC
  Administered 2020-04-11: 40 meq via ORAL
  Filled 2020-04-11: qty 2

## 2020-04-11 MED ORDER — DOXYCYCLINE HYCLATE 100 MG PO CAPS: 100.0000 mg | ORAL_CAPSULE | Freq: Two times a day (BID) | ORAL | 0 refills | Status: AC

## 2020-04-11 MED ORDER — ALBUTEROL SULFATE HFA 108 (90 BASE) MCG/ACT IN AERS: 1.0000 | INHALATION_SPRAY | Freq: Four times a day (QID) | RESPIRATORY_TRACT | 0 refills | Status: DC | PRN

## 2020-04-11 NOTE — Discharge Instructions (Addendum)
I prescribed you 2 medications.  First medication is called doxycycline.  This is a strong antibiotic that you will take twice a day for the next 10 days for your pneumonia.  Do not stop taking it early.  I am also prescribing you an albuterol inhaler.  Use this as needed for shortness of breath and wheezing.  Please continue to follow-up with your primary care provider regarding your symptoms as well as the medications that you have been prescribed.  If you worsen, you need to return to the emergency department for reevaluation.  It was a pleasure to meet you.

## 2020-04-11 NOTE — ED Triage Notes (Signed)
DX Covid 12/29, sent here by PMD for SOB

## 2020-04-11 NOTE — ED Notes (Signed)
Pt maintained SpO2 of 98% with ambulation

## 2020-04-11 NOTE — ED Provider Notes (Signed)
[facility] EMERGENCY DEPARTMENT Provider Note   CSN: [id number] Arrival date & time: 04/11/20  1435     History Chief Complaint  Patient presents with  . Covid Positive    SOB    Brianna Foster is a 65 y.o. female.  HPI Patient is a 65 year old female with a medical history as noted below.  She has been vaccinated for COVID-19.  She states about 8 days ago she began experiencing URI symptoms.  She tested positive for Covid 5 days ago.  She states that she was beginning to feel better but this morning was feeling a bit more weak and short of breath.  Also reports some mild chest tightness.  She discussed her symptoms with her PCP who recommended that she come to the emergency department for evaluation.  She reports continued productive cough with yellow sputum.  Also complains of diarrhea.  No leg swelling, calf pain, nausea, vomiting.    Past Medical History:  Diagnosis Date  . GERD (gastroesophageal reflux disease)   . Headache(784.0)   . Hypertension   . OSA (obstructive sleep apnea) 08/28/2017    Patient Active Problem List   Diagnosis Date Noted  . Healthcare maintenance 12/24/2018  . Rhinitis, allergic 06/23/2018  . OSA (obstructive sleep apnea) 08/28/2017  . Recurrent genital herpes 07/19/2017  . Unstable angina ([place]) 07/11/2017  . De Quervain's tenosynovitis, right 05/08/2016  . Cervical radiculopathy 05/08/2016  . Benign paroxysmal positional vertigo 03/20/2016  . Eczema 03/20/2016  . Hyperlipidemia, unspecified 03/20/2016  . Vertigo 10/28/2015  . Headache 10/28/2015  . Tobacco abuse 07/23/2012  . Gastroesophageal reflux disease 06/18/2011  . Essential hypertension 06/18/2011    Past Surgical History:  Procedure Laterality Date  . LEFT HEART CATH AND CORONARY ANGIOGRAPHY N/A 07/11/2017   Procedure: LEFT HEART CATH AND CORONARY ANGIOGRAPHY;  Surgeon: Martinique, Peter M, MD;  Location: [facility];  Service: Cardiovascular;  Laterality: N/A;  .  LEFT HEART CATHETERIZATION WITH CORONARY ANGIOGRAM N/A 05/11/2013   Procedure: LEFT HEART CATHETERIZATION WITH CORONARY ANGIOGRAM;  Surgeon: Burnell Blanks, MD;  Location: [facility] CATH LAB;  Service: Cardiovascular;  Laterality: N/A;  . neck fusion       OB History    Gravida  1   Para      Term      Preterm      AB  0   Living  1     SAB      IAB      Ectopic  0   Multiple      Live Births              Family History  Problem Relation Age of Onset  . Lung cancer Mother   . Stomach cancer Father   . Heart disease Brother        Pacemaker  . Esophageal cancer Brother   . Breast cancer Sister     Social History   Tobacco Use  . Smoking status: Former Smoker    Packs/day: 0.25    Years: 16.00    Pack years: 4.00    Types: Cigarettes    Quit date: 03/09/2018    Years since quitting: 2.0  . Smokeless tobacco: Never Used  Vaping Use  . Vaping Use: Never used  Substance Use Topics  . Alcohol use: Yes    Comment: Occassional Use  . Drug use: No    Home Medications Prior to Admission medications   Medication Sig Start  Date End Date Taking? Authorizing Provider  albuterol (VENTOLIN HFA) 108 (90 Base) MCG/ACT inhaler Inhale 2 puffs into the lungs every 4 (four) hours as needed for wheezing or shortness of breath. 01/13/20   Nelwyn Salisbury, MD  amLODipine (NORVASC) 5 MG tablet TAKE 1 TABLET BY MOUTH EVERY DAY 03/07/20   [place], Betty G, MD  atorvastatin (LIPITOR) 20 MG tablet Take 1 tablet (20 mg total) by mouth daily. 05/15/17   [place], Betty G, MD  azithromycin (ZITHROMAX Z-PAK) 250 MG tablet As directed 04/04/20   Nelwyn Salisbury, MD  benzonatate (TESSALON PERLES) 100 MG capsule Take 1 capsule (100 mg total) by mouth 3 (three) times daily as needed. 04/07/20   Terressa Koyanagi, DO  Blood Pressure Monitoring (BLOOD PRESSURE MONITOR AUTOMAT) DEVI 1 Device by Does not apply route daily. 10/02/18   [place], Betty G, MD  famotidine (PEPCID) 40 MG tablet Take 1 tablet  (40 mg total) by mouth at bedtime. 11/24/18   [place], Betty G, MD  fluticasone [facility]) 50 MCG/ACT nasal spray Place 1 spray into both nostrils 2 (two) times daily as needed for allergies or rhinitis. 11/27/19   [place], Betty G, MD  hydrochlorothiazide (HYDRODIURIL) 25 MG tablet TAKE 1 TABLET BY MOUTH EVERY DAY 03/21/20   [place], Betty G, MD  ibuprofen (ADVIL) 600 MG tablet Take 1 tablet (600 mg total) by mouth every 8 (eight) hours as needed for moderate pain. 07/20/19   Wyline Beady A, NP  LORazepam (ATIVAN) 0.5 MG tablet 1-2 tabs 30 - 60 min prior to MRI. Do not drive with this medicine. 03/07/20   Rodolph Bong, MD  losartan-hydrochlorothiazide (HYZAAR) 50-12.5 MG tablet Take 1 tablet by mouth daily. 05/20/19   [place], Betty G, MD  meclizine (ANTIVERT) 25 MG tablet Take 1 tablet (25 mg total) by mouth 3 (three) times daily as needed for dizziness. 01/18/20   Burchette, Elberta Fortis, MD  meloxicam (MOBIC) 15 MG tablet Take 1 tablet (15 mg total) by mouth daily. 01/18/20   Burchette, Elberta Fortis, MD  metoprolol tartrate (LOPRESSOR) 25 MG tablet TAKE 0.5 TABLETS (12.5 MG TOTAL) BY MOUTH 2 (TWO) TIMES DAILY. 02/22/20   [place], Betty G, MD  pantoprazole (PROTONIX) 40 MG tablet Take 1 tablet (40 mg total) by mouth 2 (two) times daily before a meal. Please schedule a yearly follow up for further refills. Thank you 12/14/19   Armbruster, Willaim Rayas, MD  sucralfate (CARAFATE) 1 GM/10ML suspension Take 10 mLs (1 g total) by mouth every 6 (six) hours as needed. 12/25/18   Armbruster, Willaim Rayas, MD  triamcinolone cream (KENALOG) 0.1 % Apply 1 application topically 2 (two) times daily as needed. 01/18/20   Burchette, Elberta Fortis, MD  valACYclovir (VALTREX) 500 MG tablet 1 TAB TWICE DAILY FOR 3 DAYS WITH OUTBREAKS, START WITHIN 48 HOURS AFTER ONSET. Patient taking differently: Take 500 mg by mouth 2 (two) times daily. FOR 3 DAYS WITH OUTBREAKS, START WITHIN 48 HOURS AFTER ONSET. 08/13/18   [place], Betty G, MD    Allergies     Other  Review of Systems   Review of Systems  All other systems reviewed and are negative. Ten systems reviewed and are negative for acute change, except as noted in the HPI.    Physical Exam Updated Vital Signs BP (!) 147/72 (BP Location: Right Arm)   Pulse 67   Temp 99 F (37.2 C) (Oral)   Resp 16   Ht 5\' 8"  (1.727 m)   Wt  112 kg   LMP 01/07/2010 (LMP Unknown)   SpO2 97%   BMI 37.56 kg/m   Physical Exam Vitals and nursing note reviewed.  Constitutional:      General: She is not in acute distress.    Appearance: Normal appearance. She is not ill-appearing, toxic-appearing or diaphoretic.  HENT:     Head: Normocephalic and atraumatic.     Right Ear: External ear normal.     Left Ear: External ear normal.     Nose: Nose normal.     Mouth/Throat:     Mouth: Mucous membranes are moist.     Pharynx: Oropharynx is clear. No oropharyngeal exudate or posterior oropharyngeal erythema.  Eyes:     General: No scleral icterus.       Right eye: No discharge.        Left eye: No discharge.     Extraocular Movements: Extraocular movements intact.     Conjunctiva/sclera: Conjunctivae normal.  Cardiovascular:     Rate and Rhythm: Normal rate and regular rhythm.     Pulses: Normal pulses.     Heart sounds: Normal heart sounds. No murmur heard. No friction rub. No gallop.      Comments: Regular rate and rhythm without murmurs, rubs, or gallops. Pulmonary:     Effort: Pulmonary effort is normal. No respiratory distress.     Breath sounds: No stridor. Rales present. No wheezing or rhonchi.     Comments: Rales noted in the bilateral lung bases, right greater than left. Abdominal:     General: Abdomen is flat.     Palpations: Abdomen is soft.     Tenderness: There is no abdominal tenderness.  Musculoskeletal:        General: Normal range of motion.     Cervical back: Normal range of motion and neck supple. No tenderness.     Right lower leg: No edema.     Left lower leg: No  edema.  Skin:    General: Skin is warm and dry.  Neurological:     General: No focal deficit present.     Mental Status: She is alert and oriented to person, place, and time.  Psychiatric:        Mood and Affect: Mood normal.        Behavior: Behavior normal.     ED Results / Procedures / Treatments   Labs (all labs ordered are listed, but only abnormal results are displayed) Labs Reviewed  COMPREHENSIVE METABOLIC PANEL - Abnormal; Notable for the following components:      Result Value   Potassium 3.4 (*)    Glucose, Bld 104 (*)    Total Protein 8.4 (*)    All other components within normal limits  CBC WITH DIFFERENTIAL/PLATELET  TROPONIN I (HIGH SENSITIVITY)  TROPONIN I (HIGH SENSITIVITY)    EKG EKG Interpretation  Date/Time:  Monday April 11 2020 19:25:53 EST Ventricular Rate:  69 PR Interval:  162 QRS Duration: 98 QT Interval:  416 QTC Calculation: 445 R Axis:   7 Text Interpretation: Normal sinus rhythm Moderate voltage criteria for LVH, may be normal variant ( R in aVL , Cornell product ) Borderline ECG No significant change since last tracing Confirmed by Deno Etienne [phone]) on 04/11/2020 7:42:04 PM   Radiology DG Chest Portable 1 View  Result Date: 04/11/2020 CLINICAL DATA:  COVID positive EXAM: PORTABLE CHEST 1 VIEW COMPARISON:  April 20, 2019 FINDINGS: There are hazy bilateral airspace opacities, greatest in the lower lung zones.  The cardiac silhouette is somewhat enlarged and appears to have slightly increased in size from the prior study, likely projectional. There is no pneumothorax. No large pleural effusion. IMPRESSION: Hazy bilateral airspace opacities, greatest in the lower lung zones, consistent with multifocal pneumonia (viral or bacterial). Electronically Signed   By: Constance Holster M.D.   On: 04/11/2020 15:16    Procedures Procedures (including critical care time)  Medications Ordered in ED Medications  potassium chloride SA (KLOR-CON) CR  tablet 40 mEq (40 mEq Oral Given 04/11/20 2055)   ED Course  I have reviewed the triage vital signs and the nursing notes.  Pertinent labs & imaging results that were available during my care of the patient were reviewed by me and considered in my medical decision making (see chart for details).    MDM Rules/Calculators/A&P                          Patient is a 65 year old female who presents the emergency department with pneumonia secondary to COVID-19 infection.  Basic work-up reassuring.  CBC without leukocytosis or neutrophilia.  CMP showing mild hypokalemia that was repleted with Klor-Con, otherwise no electrolyte abnormalities.  Patient noted some mild shortness of breath and chest tightness earlier today so I obtained a troponin which is not elevated at 4.  ECG reassuring.  No tachycardia, leg swelling, hypoxia, calf pain.  Doubt DVT/PE at this time.  Chest x-ray showing multifocal pneumonia.  I had the nursing staff ambulate the patient and her O2 sats were maintained above 98%.  No shortness of breath.  Patient states that her Covid symptoms were improving until this morning.  She became more fatigued once again.  Due to this, we will discharge on a course of doxycycline for pneumonia.  We will also give a prescription for albuterol inhaler.  We discussed return precautions at length.  She is also going to follow-up with her primary care provider.  We will give an additional work note.  Her questions were answered and she was amicable at the time of discharge.  Her vital signs are stable.  Final Clinical Impression(s) / ED Diagnoses Final diagnoses:  Pneumonia due to COVID-19 virus    Rx / DC Orders ED Discharge Orders         Ordered    doxycycline (VIBRAMYCIN) 100 MG capsule  2 times daily        04/11/20 2042    albuterol (VENTOLIN HFA) 108 (90 Base) MCG/ACT inhaler  Every 6 hours PRN        04/11/20 2042           Rayna Sexton, PA-C 04/11/20 2110    Deno Etienne,  DO 04/11/20 2115

## 2020-04-11 NOTE — ED Notes (Signed)
RN at bedside obtaining IV access. Will obtain EKG when finished.

## 2020-04-13 ENCOUNTER — Encounter: Payer: Self-pay | Admitting: Family Medicine

## 2020-04-13 ENCOUNTER — Ambulatory Visit: Payer: 59 | Admitting: Family Medicine

## 2020-04-13 ENCOUNTER — Other Ambulatory Visit: Payer: Self-pay

## 2020-04-13 VITALS — BP 138/84 | HR 72 | Temp 98.6°F | Resp 16 | Ht 68.0 in | Wt 240.0 lb

## 2020-04-13 DIAGNOSIS — I10 Essential (primary) hypertension: Secondary | ICD-10-CM

## 2020-04-13 DIAGNOSIS — U071 COVID-19: Secondary | ICD-10-CM

## 2020-04-13 DIAGNOSIS — E876 Hypokalemia: Secondary | ICD-10-CM

## 2020-04-13 DIAGNOSIS — J189 Pneumonia, unspecified organism: Secondary | ICD-10-CM | POA: Diagnosis not present

## 2020-04-13 MED ORDER — POTASSIUM CHLORIDE CRYS ER 20 MEQ PO TBCR: 20.0000 meq | EXTENDED_RELEASE_TABLET | Freq: Every day | ORAL | 0 refills | Status: DC

## 2020-04-13 NOTE — Assessment & Plan Note (Signed)
BP adequately controlled. Continue HCTZ 25 mg daily, amlodipine 5 mg daily, and metoprolol titrate 25 mg 1/2 tablet twice daily. Monitor BP at home. Continue low-salt diet.

## 2020-04-13 NOTE — Assessment & Plan Note (Addendum)
Problem has been intermittent for a while. She prefers to continue HCTZ, some side effect discussed. She agrees with K-Lor supplementation, 20 mg daily. We will plan on checking K+ in 3-4 weeks.

## 2020-04-13 NOTE — Progress Notes (Signed)
Chief Complaint  Patient presents with  . ER follow up    HPI: Brianna Foster is a 65 y.o. female  who is here today to follow on recent ER visit. Reporting positive COVID-19 test on 04/06/2020, she started with symptoms on 04/03/2020.  She was seen in the ER on 04/11/20 because of SOB. She was started on doxycycline 100 mg twice daily, second day of antibiotics today. She has tolerated the medication well. No significant changes in symptom severity.  CXR on 04/11/20:Hazy bilateral airspace opacities, greatest in the lower lung zones, consistent with multifocal pneumonia (viral or bacterial).  She has not noted fever, sore throat, anosmia, or ageusia. + Rhinorrhea and nasal congestion. Productive cough, no hemoptysis. "Little" wheezing  She had diarrhea initially. Negative for abdominal pain, nausea, vomiting, or urinary symptoms.  COVID 19 vaccination completed. She was planning on getting booster.  Sick contact: Her daughter's step mother. Hypokalemia: Currently she is not on potassium supplementation. Hypertension: She is on HCTZ 25 mg daily, amlodipine 5 mg daily, and metoprolol tartrate 12.5 mg twice daily. Negative for CP, palpitations, decreased urine output, gross hematuria, focal neurologic deficit, or edema.  Lab Results  Component Value Date   CREATININE 0.84 04/11/2020   BUN 10 04/11/2020   NA 140 04/11/2020   K 3.4 (L) 04/11/2020   CL 101 04/11/2020   CO2 27 04/11/2020   Review of Systems  Constitutional: Positive for activity change, appetite change and fatigue.  HENT: Negative for mouth sores and nosebleeds.   Eyes: Negative for redness and visual disturbance.  Musculoskeletal: Negative for gait problem and myalgias.  Skin: Negative for pallor and rash.  Allergic/Immunologic: Positive for environmental allergies.  Neurological: Negative for syncope and headaches.  Rest see pertinent positives and negatives per HPI.  Current Outpatient  Medications on File Prior to Visit  Medication Sig Dispense Refill  . albuterol (VENTOLIN HFA) 108 (90 Base) MCG/ACT inhaler Inhale 1-2 puffs into the lungs every 6 (six) hours as needed for wheezing or shortness of breath. 6.7 g 0  . amLODipine (NORVASC) 5 MG tablet TAKE 1 TABLET BY MOUTH EVERY DAY 90 tablet 2  . atorvastatin (LIPITOR) 20 MG tablet Take 1 tablet (20 mg total) by mouth daily. 90 tablet 1  . azithromycin (ZITHROMAX Z-PAK) 250 MG tablet As directed 6 each 0  . benzonatate (TESSALON PERLES) 100 MG capsule Take 1 capsule (100 mg total) by mouth 3 (three) times daily as needed. 20 capsule 0  . Blood Pressure Monitoring (BLOOD PRESSURE MONITOR AUTOMAT) DEVI 1 Device by Does not apply route daily. 1 Device 0  . doxycycline (VIBRAMYCIN) 100 MG capsule Take 1 capsule (100 mg total) by mouth 2 (two) times daily for 10 days. 20 capsule 0  . famotidine (PEPCID) 40 MG tablet Take 1 tablet (40 mg total) by mouth at bedtime. 90 tablet 0  . fluticasone (FLONASE) 50 MCG/ACT nasal spray Place 1 spray into both nostrils 2 (two) times daily as needed for allergies or rhinitis. 16 g 5  . hydrochlorothiazide (HYDRODIURIL) 25 MG tablet TAKE 1 TABLET BY MOUTH EVERY DAY 30 tablet 0  . ibuprofen (ADVIL) 600 MG tablet Take 1 tablet (600 mg total) by mouth every 8 (eight) hours as needed for moderate pain. 60 tablet 1  . LORazepam (ATIVAN) 0.5 MG tablet 1-2 tabs 30 - 60 min prior to MRI. Do not drive with this medicine. 4 tablet 0  . meclizine (ANTIVERT) 25 MG tablet Take 1 tablet (  25 mg total) by mouth 3 (three) times daily as needed for dizziness. 30 tablet 0  . meloxicam (MOBIC) 15 MG tablet Take 1 tablet (15 mg total) by mouth daily. 30 tablet 1  . metoprolol tartrate (LOPRESSOR) 25 MG tablet TAKE 0.5 TABLETS (12.5 MG TOTAL) BY MOUTH 2 (TWO) TIMES DAILY. 90 tablet 1  . pantoprazole (PROTONIX) 40 MG tablet Take 1 tablet (40 mg total) by mouth 2 (two) times daily before a meal. Please schedule a yearly  follow up for further refills. Thank you 60 tablet 1  . sucralfate (CARAFATE) 1 GM/10ML suspension Take 10 mLs (1 g total) by mouth every 6 (six) hours as needed. 420 mL 3  . triamcinolone cream (KENALOG) 0.1 % Apply 1 application topically 2 (two) times daily as needed. 45 g 1  . valACYclovir (VALTREX) 500 MG tablet 1 TAB TWICE DAILY FOR 3 DAYS WITH OUTBREAKS, START WITHIN 48 HOURS AFTER ONSET. (Patient taking differently: Take 500 mg by mouth 2 (two) times daily. FOR 3 DAYS WITH OUTBREAKS, START WITHIN 48 HOURS AFTER ONSET.) 18 tablet 1   No current facility-administered medications on file prior to visit.   Past Medical History:  Diagnosis Date  . GERD (gastroesophageal reflux disease)   . Headache(784.0)   . Hypertension   . OSA (obstructive sleep apnea) 08/28/2017   Allergies  Allergen Reactions  . Other Anaphylaxis, Shortness Of Breath and Swelling    [place] NUTS    Social History   Socioeconomic History  . Marital status: Divorced    Spouse name: Not on file  . Number of children: 1  . Years of education: College  . Highest education level: Not on file  Occupational History    Comment: [employer]  Tobacco Use  . Smoking status: Former Smoker    Packs/day: 0.25    Years: 16.00    Pack years: 4.00    Types: Cigarettes    Quit date: 03/09/2018    Years since quitting: 2.0  . Smokeless tobacco: Never Used  Vaping Use  . Vaping Use: Never used  Substance and Sexual Activity  . Alcohol use: Yes    Comment: Occassional Use  . Drug use: No  . Sexual activity: Not Currently    Birth control/protection: Post-menopausal  Other Topics Concern  . Not on file  Social History Narrative   Lives at home alone   Right-handed   Drinks decaf coffee   Social Determinants of Health   Financial Resource Strain: Not on file  Food Insecurity: Not on file  Transportation Needs: Not on file  Physical Activity: Not on file  Stress: Not on file  Social Connections: Not on  file    Vitals:   04/13/20 1233  BP: 138/84  Pulse: 72  Resp: 16  Temp: 98.6 F (37 C)  SpO2: 97%   Body mass index is 36.49 kg/m.  Physical Exam Vitals and nursing note reviewed.  Constitutional:      General: She is not in acute distress.    Appearance: She is well-developed.  HENT:     Head: Normocephalic and atraumatic.     Mouth/Throat:     Mouth: Oropharynx is clear and moist and mucous membranes are normal.  Eyes:     Conjunctiva/sclera: Conjunctivae normal.     Pupils: Pupils are equal, round, and reactive to light.  Cardiovascular:     Rate and Rhythm: Normal rate and regular rhythm.     Heart sounds: No murmur heard.  Pulmonary:     Effort: Pulmonary effort is normal. No respiratory distress.     Breath sounds: Examination of the right-lower field reveals rales. Rales present. No wheezing or rhonchi.  Abdominal:     Palpations: Abdomen is soft. There is no hepatomegaly or mass.     Tenderness: There is no abdominal tenderness.  Musculoskeletal:        General: No edema.  Lymphadenopathy:     Cervical: No cervical adenopathy.  Skin:    General: Skin is warm.     Findings: No erythema or rash.  Neurological:     Mental Status: She is alert and oriented to person, place, and time.     Cranial Nerves: No cranial nerve deficit.     Gait: Gait normal.     Deep Tendon Reflexes: Strength normal.  Psychiatric:        Mood and Affect: Mood and affect normal.     Comments: Well groomed, good eye contact.   ASSESSMENT AND PLAN:  Ms.Laketta was seen today for er follow up.  Orders Placed This Encounter  Procedures  . DG Chest 2 View  . Potassium   COVID-19 virus infection We discussed diagnosis and possible complications. She seems to be recovering well.  Pneumonia of both lower lobes due to infectious organism Even though it is more likely viral, recommend completing antibiotic treatment. Clearly instructed about warning signs. We will plan on  repeating chest x-ray in 4 weeks, before if needed.  Essential hypertension BP adequately controlled. Continue HCTZ 25 mg daily, amlodipine 5 mg daily, and metoprolol titrate 25 mg 1/2 tablet twice daily. Monitor BP at home. Continue low-salt diet.  Hypokalemia Problem has been intermittent for a while. She prefers to continue HCTZ, some side effect discussed. She agrees with K-Lor supplementation, 20 mg daily. We will plan on checking K+ in 3-4 weeks.   Return in about 6 months (around 10/11/2020) for X ray and lab in 4 weeks..   Yaffa Seckman G. Martinique, MD  [facility]. [place] office.  A few things to remember from today's visit:   COVID-19 virus infection  Pneumonia of both lungs due to infectious organism, unspecified part of lung  Hypokalemia - Plan: potassium chloride SA (KLOR-CON) 20 MEQ tablet  If you need refills please call your pharmacy. Do not use My Chart to request refills or for acute issues that need immediate attention.   Complete antibiotic treatment. We will repeat X ray in 4 weeks. Plain Mucinex may help with cough.  Potassium supplementation started today.  Please be sure medication list is accurate. If a new problem present, please set up appointment sooner than planned today.

## 2020-04-13 NOTE — Patient Instructions (Signed)
A few things to remember from today's visit:   COVID-19 virus infection  Pneumonia of both lungs due to infectious organism, unspecified part of lung  Hypokalemia - Plan: potassium chloride SA (KLOR-CON) 20 MEQ tablet  If you need refills please call your pharmacy. Do not use My Chart to request refills or for acute issues that need immediate attention.   Complete antibiotic treatment. We will repeat X ray in 4 weeks. Plain Mucinex may help with cough.  Potassium supplementation started today.  Please be sure medication list is accurate. If a new problem present, please set up appointment sooner than planned today.

## 2020-04-15 ENCOUNTER — Encounter: Payer: Self-pay | Admitting: Gastroenterology

## 2020-04-17 ENCOUNTER — Other Ambulatory Visit: Payer: Self-pay | Admitting: Family Medicine

## 2020-04-17 DIAGNOSIS — R600 Localized edema: Secondary | ICD-10-CM

## 2020-04-17 DIAGNOSIS — I10 Essential (primary) hypertension: Secondary | ICD-10-CM

## 2020-05-04 ENCOUNTER — Telehealth: Payer: Self-pay | Admitting: Family Medicine

## 2020-05-04 NOTE — Telephone Encounter (Signed)
LMVM to contact the office to confirm to come to the office tomorrow at 7:45 for her labs and X-Ray.   The X-Ray tech is not in the office til 8 AM.  Okay to leave her on the lab schedule at 7:20

## 2020-05-05 ENCOUNTER — Ambulatory Visit (INDEPENDENT_AMBULATORY_CARE_PROVIDER_SITE_OTHER): Payer: 59

## 2020-05-05 ENCOUNTER — Other Ambulatory Visit (INDEPENDENT_AMBULATORY_CARE_PROVIDER_SITE_OTHER): Payer: 59

## 2020-05-05 ENCOUNTER — Other Ambulatory Visit: Payer: Self-pay

## 2020-05-05 DIAGNOSIS — U071 COVID-19: Secondary | ICD-10-CM

## 2020-05-05 DIAGNOSIS — E876 Hypokalemia: Secondary | ICD-10-CM | POA: Diagnosis not present

## 2020-05-05 DIAGNOSIS — J189 Pneumonia, unspecified organism: Secondary | ICD-10-CM | POA: Diagnosis not present

## 2020-05-05 LAB — POTASSIUM: Potassium: 4.3 mEq/L (ref 3.5–5.1)

## 2020-05-06 ENCOUNTER — Encounter: Payer: Self-pay | Admitting: Family Medicine

## 2020-05-06 ENCOUNTER — Ambulatory Visit: Payer: 59 | Admitting: Family Medicine

## 2020-05-06 VITALS — BP 130/80 | HR 72 | Temp 98.1°F | Resp 16 | Ht 68.0 in | Wt 249.0 lb

## 2020-05-06 DIAGNOSIS — I10 Essential (primary) hypertension: Secondary | ICD-10-CM

## 2020-05-06 DIAGNOSIS — J181 Lobar pneumonia, unspecified organism: Secondary | ICD-10-CM | POA: Diagnosis not present

## 2020-05-06 DIAGNOSIS — R0789 Other chest pain: Secondary | ICD-10-CM | POA: Diagnosis not present

## 2020-05-06 DIAGNOSIS — K219 Gastro-esophageal reflux disease without esophagitis: Secondary | ICD-10-CM | POA: Diagnosis not present

## 2020-05-06 DIAGNOSIS — R5383 Other fatigue: Secondary | ICD-10-CM | POA: Diagnosis not present

## 2020-05-06 MED ORDER — DOXYCYCLINE HYCLATE 100 MG PO TABS: 100.0000 mg | ORAL_TABLET | Freq: Two times a day (BID) | ORAL | 0 refills | Status: AC

## 2020-05-06 MED ORDER — PANTOPRAZOLE SODIUM 40 MG PO TBEC: 40.0000 mg | DELAYED_RELEASE_TABLET | Freq: Two times a day (BID) | ORAL | 2 refills | Status: DC

## 2020-05-06 NOTE — Patient Instructions (Addendum)
A few things to remember from today's visit:   Lobar pneumonia, unspecified organism ([place]) - Plan: doxycycline (VIBRA-TABS) 100 MG tablet, DG Chest 2 View  Sensation of chest pressure  Gastroesophageal reflux disease, unspecified whether esophagitis present  If you need refills please call your pharmacy. Do not use My Chart to request refills or for acute issues that need immediate attention.   I think fatigue is because recent covid infection, continue wearing your CPAP.  Doxycycline to take with food. Chest discomfort seems to be related with acid reflux. Continue Protonix same dose for now. We will repeat chest X ray in 3-4 weeks and if still not any better or worse we will go for chest CT.  Start counting calories and engage in physical activity as tolerated.  Please be sure medication list is accurate. If a new problem present, please set up appointment sooner than planned today.

## 2020-05-06 NOTE — Progress Notes (Signed)
HPI: Brianna Foster is a 65 y.o. female, who is here today to follow on recent OV. She was last seen on 04/13/2020, when she was following on recent ER visit after diagnosed with COVID-19 infection. In general symptoms have improved. Cough has resolved. Negative for wheezing.  Still feeling short of breath and fatigue when going up stairs, no other associated symptom. Night sweats but has not noted fever or chills. She wonders if she needs to call sleep specialist to re-check CPAP settings. Dx'ed 12/2018, has had CPAP for about a year.  CXR on 05/05/20: Focus of increased opacity in the posterior left base, a likely focus of pneumonia. Lungs elsewhere clear. Heart borderline enlarged. No adenopathy appreciable.  Lab Results  Component Value Date   WBC 5.8 04/11/2020   HGB 14.5 04/11/2020   HCT 43.2 04/11/2020   MCV 90.4 04/11/2020   PLT 307 04/11/2020   CXR on 04/11/2020:Hazy bilateral airspace opacities, greatest in the lower lung zones, consistent with multifocal pneumonia (viral or bacterial).  Middle chest discomfort and burping a lot, the symptoms are not exacerbated by exertion. Mild pressure like sensation, no radiated. She has not identified exacerbating or alleviating factors.  On Protonix 40 mg, which she has not been taking as consistent as she was when initially prescribed. In the past he has been on Nexium, omeprazole, and Pepcid.  She has not noted heartburn, abdominal pain, nausea, or vomiting.  Intermittent episodes of CP since 02/2008.  She has followed with cardiology in the past due to CP.   Currently she is on amlodipine 5 mg daily, metoprolol tartrate 25 mg 1/2 tablet twice daily, and HCTZ 25 mg daily.  Left Heart and coronary angiography done in 07/2017.  The left ventricular systolic function is normal.  LV end diastolic pressure is normal.  The left ventricular ejection fraction is 55-65% by visual estimate.  1. Normal coronary  anatomy 2. Normal LV function 3. Normal LVEDP  Chest CT done on 04/20/2019 because of shortness of breath. 1. Limited study for the detection of pulmonary emboli as detailed above. Given these limitations, no PE was identified. 2. No acute cardiopulmonary process. The lungs are essentially clear. 3. Hepatic steatosis.  04/11/2020 troponin normal x1. 04/20/2019 troponin in normal range x2. 07/2017 troponin in normal range x3.  Review of Systems  Constitutional: Positive for activity change and appetite change.  HENT: Negative for mouth sores, nosebleeds, sore throat and trouble swallowing.   Eyes: Negative for redness and visual disturbance.  Cardiovascular: Negative for palpitations and leg swelling.  Gastrointestinal:       Negative for changes in bowel habits.  Genitourinary: Negative for decreased urine volume, dysuria and hematuria.  Allergic/Immunologic: Positive for environmental allergies.  Neurological: Negative for syncope, weakness and headaches.  Psychiatric/Behavioral: Negative for confusion.  Rest see pertinent positives and negatives per HPI.  Current Outpatient Medications on File Prior to Visit  Medication Sig Dispense Refill  . albuterol (VENTOLIN HFA) 108 (90 Base) MCG/ACT inhaler Inhale 1-2 puffs into the lungs every 6 (six) hours as needed for wheezing or shortness of breath. 6.7 g 0  . amLODipine (NORVASC) 5 MG tablet TAKE 1 TABLET BY MOUTH EVERY DAY 90 tablet 2  . atorvastatin (LIPITOR) 20 MG tablet Take 1 tablet (20 mg total) by mouth daily. 90 tablet 1  . azithromycin (ZITHROMAX Z-PAK) 250 MG tablet As directed 6 each 0  . benzonatate (TESSALON PERLES) 100 MG capsule Take 1 capsule (100 mg total)  by mouth 3 (three) times daily as needed. 20 capsule 0  . Blood Pressure Monitoring (BLOOD PRESSURE MONITOR AUTOMAT) DEVI 1 Device by Does not apply route daily. 1 Device 0  . famotidine (PEPCID) 40 MG tablet Take 1 tablet (40 mg total) by mouth at bedtime. 90 tablet 0   . fluticasone (FLONASE) 50 MCG/ACT nasal spray Place 1 spray into both nostrils 2 (two) times daily as needed for allergies or rhinitis. 16 g 5  . hydrochlorothiazide (HYDRODIURIL) 25 MG tablet TAKE 1 TABLET BY MOUTH EVERY DAY 30 tablet 3  . ibuprofen (ADVIL) 600 MG tablet Take 1 tablet (600 mg total) by mouth every 8 (eight) hours as needed for moderate pain. 60 tablet 1  . meclizine (ANTIVERT) 25 MG tablet Take 1 tablet (25 mg total) by mouth 3 (three) times daily as needed for dizziness. 30 tablet 0  . meloxicam (MOBIC) 15 MG tablet Take 1 tablet (15 mg total) by mouth daily. 30 tablet 1  . metoprolol tartrate (LOPRESSOR) 25 MG tablet TAKE 0.5 TABLETS (12.5 MG TOTAL) BY MOUTH 2 (TWO) TIMES DAILY. 90 tablet 1  . potassium chloride SA (KLOR-CON) 20 MEQ tablet Take 1 tablet (20 mEq total) by mouth daily. 90 tablet 0  . sucralfate (CARAFATE) 1 GM/10ML suspension Take 10 mLs (1 g total) by mouth every 6 (six) hours as needed. 420 mL 3  . triamcinolone cream (KENALOG) 0.1 % Apply 1 application topically 2 (two) times daily as needed. 45 g 1  . valACYclovir (VALTREX) 500 MG tablet 1 TAB TWICE DAILY FOR 3 DAYS WITH OUTBREAKS, START WITHIN 48 HOURS AFTER ONSET. (Patient taking differently: Take 500 mg by mouth 2 (two) times daily. FOR 3 DAYS WITH OUTBREAKS, START WITHIN 48 HOURS AFTER ONSET.) 18 tablet 1   No current facility-administered medications on file prior to visit.    Past Medical History:  Diagnosis Date  . COVID-19   . GERD (gastroesophageal reflux disease)   . Headache(784.0)   . Hypertension   . OSA (obstructive sleep apnea) 08/28/2017  . Pneumonia    Allergies  Allergen Reactions  . Other Anaphylaxis, Shortness Of Breath and Swelling    [place] NUTS    Social History   Socioeconomic History  . Marital status: Divorced    Spouse name: Not on file  . Number of children: 1  . Years of education: College  . Highest education level: Not on file  Occupational History     Comment: [employer]  Tobacco Use  . Smoking status: Former Smoker    Packs/day: 0.25    Years: 16.00    Pack years: 4.00    Types: Cigarettes    Quit date: 03/09/2018    Years since quitting: 2.1  . Smokeless tobacco: Never Used  Vaping Use  . Vaping Use: Never used  Substance and Sexual Activity  . Alcohol use: Yes    Comment: Occassional Use  . Drug use: No  . Sexual activity: Not Currently    Birth control/protection: Post-menopausal  Other Topics Concern  . Not on file  Social History Narrative   Lives at home alone   Right-handed   Drinks decaf coffee   Social Determinants of Health   Financial Resource Strain: Not on file  Food Insecurity: Not on file  Transportation Needs: Not on file  Physical Activity: Not on file  Stress: Not on file  Social Connections: Not on file    Vitals:   05/06/20 0729  BP: 130/80  Pulse: 72  Resp: 16  Temp: 98.1 F (36.7 C)  SpO2: 97%   Wt Readings from Last 3 Encounters:  05/06/20 249 lb (112.9 kg)  04/13/20 240 lb (108.9 kg)  04/11/20 247 lb (112 kg)   Body mass index is 37.86 kg/m.  Physical Exam Vitals and nursing note reviewed.  Constitutional:      General: She is not in acute distress.    Appearance: She is well-developed.  HENT:     Head: Normocephalic and atraumatic.     Mouth/Throat:     Mouth: Oropharynx is clear and moist and mucous membranes are normal.  Eyes:     Conjunctiva/sclera: Conjunctivae normal.     Pupils: Pupils are equal, round, and reactive to light.  Cardiovascular:     Rate and Rhythm: Normal rate and regular rhythm.     Heart sounds: No murmur heard.   Pulmonary:     Effort: Pulmonary effort is normal. No respiratory distress.     Breath sounds: Normal breath sounds.  Abdominal:     Palpations: Abdomen is soft. There is no hepatomegaly or mass.     Tenderness: There is no abdominal tenderness.  Musculoskeletal:        General: No edema.  Lymphadenopathy:     Cervical: No  cervical adenopathy.  Skin:    General: Skin is warm.     Findings: No erythema or rash.  Neurological:     Mental Status: She is alert and oriented to person, place, and time.     Cranial Nerves: No cranial nerve deficit.     Gait: Gait normal.     Deep Tendon Reflexes: Strength normal.  Psychiatric:        Mood and Affect: Mood and affect normal.     Comments: Well groomed, good eye contact.   ASSESSMENT AND PLAN:  Brianna Foster was seen today for follow-up.  Diagnoses and all orders for this visit: Orders Placed This Encounter  Procedures  . DG Chest 2 View    Fatigue, unspecified type We discussed possible etiologies: Systemic illness, immunologic,endocrinology,sleep disorder, psychiatric/psychologic, infectious,medications side effects, and idiopathic. Examination today does not suggest a serious process. Most likely related to recent illness, COVID 19 infection.  Lobar pneumonia, unspecified organism ([place]) CXR showed improvement of multifocal opacities but increased opacity in the posterior left base. Will treat empirically with Doxycycline, some side effects discussed. CXR in 3-4 weeks, if abnormalities are persistent, we may need chest CT.  -     doxycycline (VIBRA-TABS) 100 MG tablet; Take 1 tablet (100 mg total) by mouth 2 (two) times daily for 7 days.  Sensation of chest pressure We discussed possible etiologies. This seems to be a chronic problems. Cardiac work-up has been negative. ? GERD. Instructed about warning signs.  Gastroesophageal reflux disease, unspecified whether esophagitis present This could be a contributing factor for chest discomfort. Protonix to take daily 40 mg bid. GERD precautions. After a few weeks she can try to decrease dose of Protonix to 40 mg daily.  -     pantoprazole (PROTONIX) 40 MG tablet; Take 1 tablet (40 mg total) by mouth 2 (two) times daily before a meal. Please schedule a yearly follow up for further refills. Thank  you  Essential hypertension BP adequately controlled. Continue HCTZ 25 mg daily, Amlodipine 5 mg daily,and Metoprolol 12.5 mg bid. Low salt diet.  Spent 40 minutes.  During this time history was obtained and documented, examination was performed, prior labs/imaging reviewed, and assessment/plan  discussed.  Return if symptoms worsen or fail to improve, for CXR in 3 weeks..  Advaith Lamarque G. [place], MD  [facility]. [place] [facility].  A few things to remember from today's visit:   Lobar pneumonia, unspecified organism ([place]) - Plan: doxycycline (VIBRA-TABS) 100 MG tablet, DG Chest 2 View  Sensation of chest pressure  Gastroesophageal reflux disease, unspecified whether esophagitis present  If you need refills please call your pharmacy. Do not use My Chart to request refills or for acute issues that need immediate attention.   I think fatigue is because recent covid infection, continue wearing your CPAP.  Doxycycline to take with food. Chest discomfort seems to be related with acid reflux. Continue Protonix same dose for now. We will repeat chest X ray in 3-4 weeks and if still not any better or worse we will go for chest CT.  Start counting calories and engage in physical activity as tolerated.  Please be sure medication list is accurate. If a new problem present, please set up appointment sooner than planned today.

## 2020-05-10 ENCOUNTER — Ambulatory Visit (INDEPENDENT_AMBULATORY_CARE_PROVIDER_SITE_OTHER): Payer: 59 | Admitting: Adult Health

## 2020-05-10 ENCOUNTER — Encounter: Payer: Self-pay | Admitting: Adult Health

## 2020-05-10 ENCOUNTER — Other Ambulatory Visit: Payer: Self-pay

## 2020-05-10 ENCOUNTER — Ambulatory Visit: Payer: 59

## 2020-05-10 VITALS — BP 124/70 | HR 71 | Temp 98.4°F | Ht 69.0 in | Wt 250.8 lb

## 2020-05-10 DIAGNOSIS — J1282 Pneumonia due to coronavirus disease 2019: Secondary | ICD-10-CM | POA: Diagnosis not present

## 2020-05-10 DIAGNOSIS — G4733 Obstructive sleep apnea (adult) (pediatric): Secondary | ICD-10-CM | POA: Diagnosis not present

## 2020-05-10 DIAGNOSIS — U071 COVID-19: Secondary | ICD-10-CM | POA: Insufficient documentation

## 2020-05-10 NOTE — Progress Notes (Unsigned)
@Patient  ID: Brianna Foster, female    DOB: 11/29/1955, 65 y.o.   MRN: [id number]  Chief Complaint  Patient presents with  . Sleep Apnea  . Follow-up    Referring provider: Martinique, Betty G, MD  HPI: 65 year old female former smoker  followed for obstructive sleep apnea  TEST/EVENTS :  HST 08/26/17 >> AHI 30.3, SaO2 low 71%. HST 12/21/18 >> AHI 59.9, SpO2 low 76% Auto CPAP 02/09/19 to 03/10/19 >> used on 30 of 30 nights with average 8 hrs 14 min.  Average AHI 0.8 with median CPAP 8 and 95 th percentile CPAP 11 cm H2O.  05/10/2020 Follow up : OSA Patient presents for a follow-up visit for sleep apnea.  Patient has underlying obstructive sleep apnea and is on nocturnal CPAP.  Patient says overall she is doing well on CPAP. Does feel tired sometimes if after wearing CPAP all night.  CPAP download shows excellent compliance with daily average usage at 9 hours.  Patient is on auto CPAP 5 to 15 cm H2O.  AHI 0.5.  Recently had COVID-19.  And was treated for Covid pneumonia with chest x-ray showing a left basilar opacity.  She was seen by her primary care provider and given doxycycline. Patient is feeling better , cough has resolved. Appetite is very good.  No hemoptysis or increased leg swelling  Covid vaccine x 2 . No booster.    Allergies  Allergen Reactions  . Other Anaphylaxis, Shortness Of Breath and Swelling    [place] NUTS    Immunization History  Administered Date(s) Administered  . Influenza Split 02/03/2008  . PFIZER(Purple Top)SARS-COV-2 Vaccination 06/14/2019, 07/07/2019  . Tdap 12/11/2016    Past Medical History:  Diagnosis Date  . COVID-19   . GERD (gastroesophageal reflux disease)   . Headache(784.0)   . Hypertension   . OSA (obstructive sleep apnea) 08/28/2017  . Pneumonia     Tobacco History: Social History   Tobacco Use  Smoking Status Former Smoker  . Packs/day: 0.25  . Years: 16.00  . Pack years: 4.00  . Types: Cigarettes  . Quit date: 03/09/2018   . Years since quitting: 2.1  Smokeless Tobacco Never Used   Counseling given: Not Answered   Outpatient Medications Prior to Visit  Medication Sig Dispense Refill  . albuterol (VENTOLIN HFA) 108 (90 Base) MCG/ACT inhaler Inhale 1-2 puffs into the lungs every 6 (six) hours as needed for wheezing or shortness of breath. 6.7 g 0  . amLODipine (NORVASC) 5 MG tablet TAKE 1 TABLET BY MOUTH EVERY DAY 90 tablet 2  . atorvastatin (LIPITOR) 20 MG tablet Take 1 tablet (20 mg total) by mouth daily. 90 tablet 1  . benzonatate (TESSALON PERLES) 100 MG capsule Take 1 capsule (100 mg total) by mouth 3 (three) times daily as needed. 20 capsule 0  . Blood Pressure Monitoring (BLOOD PRESSURE MONITOR AUTOMAT) DEVI 1 Device by Does not apply route daily. 1 Device 0  . doxycycline (VIBRA-TABS) 100 MG tablet Take 1 tablet (100 mg total) by mouth 2 (two) times daily for 7 days. 14 tablet 0  . famotidine (PEPCID) 40 MG tablet Take 1 tablet (40 mg total) by mouth at bedtime. 90 tablet 0  . fluticasone (FLONASE) 50 MCG/ACT nasal spray Place 1 spray into both nostrils 2 (two) times daily as needed for allergies or rhinitis. 16 g 5  . hydrochlorothiazide (HYDRODIURIL) 25 MG tablet TAKE 1 TABLET BY MOUTH EVERY DAY 30 tablet 3  . ibuprofen (ADVIL) 600 MG  tablet Take 1 tablet (600 mg total) by mouth every 8 (eight) hours as needed for moderate pain. 60 tablet 1  . meclizine (ANTIVERT) 25 MG tablet Take 1 tablet (25 mg total) by mouth 3 (three) times daily as needed for dizziness. 30 tablet 0  . meloxicam (MOBIC) 15 MG tablet Take 1 tablet (15 mg total) by mouth daily. 30 tablet 1  . metoprolol tartrate (LOPRESSOR) 25 MG tablet TAKE 0.5 TABLETS (12.5 MG TOTAL) BY MOUTH 2 (TWO) TIMES DAILY. 90 tablet 1  . pantoprazole (PROTONIX) 40 MG tablet Take 1 tablet (40 mg total) by mouth 2 (two) times daily before a meal. Please schedule a yearly follow up for further refills. Thank you 60 tablet 2  . potassium chloride SA (KLOR-CON)  20 MEQ tablet Take 1 tablet (20 mEq total) by mouth daily. 90 tablet 0  . sucralfate (CARAFATE) 1 GM/10ML suspension Take 10 mLs (1 g total) by mouth every 6 (six) hours as needed. 420 mL 3  . triamcinolone cream (KENALOG) 0.1 % Apply 1 application topically 2 (two) times daily as needed. 45 g 1  . valACYclovir (VALTREX) 500 MG tablet 1 TAB TWICE DAILY FOR 3 DAYS WITH OUTBREAKS, START WITHIN 48 HOURS AFTER ONSET. (Patient taking differently: Take 500 mg by mouth 2 (two) times daily. FOR 3 DAYS WITH OUTBREAKS, START WITHIN 48 HOURS AFTER ONSET.) 18 tablet 1  . azithromycin (ZITHROMAX Z-PAK) 250 MG tablet As directed (Patient not taking: Reported on 05/10/2020) 6 each 0   No facility-administered medications prior to visit.     Review of Systems:   Constitutional:   No  weight loss, night sweats,  Fevers, chills,  +fatigue, or  lassitude.  HEENT:   No headaches,  Difficulty swallowing,  Tooth/dental problems, or  Sore throat,                No sneezing, itching, ear ache,  +nasal congestion, post nasal drip,   CV:  No chest pain,  Orthopnea, PND, swelling in lower extremities, anasarca, dizziness, palpitations, syncope.   GI  No heartburn, indigestion, abdominal pain, nausea, vomiting, diarrhea, change in bowel habits, loss of appetite, bloody stools.   Resp:    No chest wall deformity  Skin: no rash or lesions.  GU: no dysuria, change in color of urine, no urgency or frequency.  No flank pain, no hematuria   MS:  No joint pain or swelling.  No decreased range of motion.  No back pain.    Physical Exam  BP 124/70 (BP Location: Left Arm, Patient Position: Sitting, Cuff Size: Normal)   Pulse 71   Temp 98.4 F (36.9 C) (Temporal)   Ht 5\' 9"  (1.753 m)   Wt 250 lb 12.8 oz (113.8 kg)   LMP 01/07/2010 (LMP Unknown)   SpO2 98%   BMI 37.04 kg/m   GEN: A/Ox3; pleasant , NAD, well nourished    HEENT:  [place]/AT,   , NOSE-clear, THROAT-clear, no lesions, no postnasal drip or exudate  noted.   NECK:  Supple w/ fair ROM; no JVD; normal carotid impulses w/o bruits; no thyromegaly or nodules palpated; no lymphadenopathy.    RESP  Clear  P & A; w/o, wheezes/ rales/ or rhonchi. no accessory muscle use, no dullness to percussion  CARD:  RRR, no m/r/g, no peripheral edema, pulses intact, no cyanosis or clubbing.  GI:   Soft & nt; nml bowel sounds; no organomegaly or masses detected.   Musco: Warm bil, no deformities or  joint swelling noted.   Neuro: alert, no focal deficits noted.    Skin: Warm, no lesions or rashes    BMET   BNP No results found for: BNP  ProBNP No results found for: PROBNP  Imaging: DG Chest 2 View  Result Date: 05/05/2020 CLINICAL DATA:  Difficulty breathing EXAM: CHEST - 2 VIEW COMPARISON:  April 11, 2020 FINDINGS: There is an area of ill-defined opacity in the posterior left base. Lungs elsewhere clear. Heart is borderline enlarged with pulmonary vascularity normal. No adenopathy. There is postoperative change in the lower cervical spine. IMPRESSION: Focus of increased opacity in the posterior left base, a likely focus of pneumonia. Lungs elsewhere clear. Heart borderline enlarged. No adenopathy appreciable. These results will be called to the ordering clinician or representative by the Radiologist Assistant, and communication documented in the PACS or [employer]. Electronically Signed   By: Lowella Grip III M.D.   On: 05/05/2020 08:13   DG Chest Portable 1 View  Result Date: 04/11/2020 CLINICAL DATA:  COVID positive EXAM: PORTABLE CHEST 1 VIEW COMPARISON:  April 20, 2019 FINDINGS: There are hazy bilateral airspace opacities, greatest in the lower lung zones. The cardiac silhouette is somewhat enlarged and appears to have slightly increased in size from the prior study, likely projectional. There is no pneumothorax. No large pleural effusion. IMPRESSION: Hazy bilateral airspace opacities, greatest in the lower lung zones, consistent  with multifocal pneumonia (viral or bacterial). Electronically Signed   By: Constance Holster M.D.   On: 04/11/2020 15:16      No flowsheet data found.  No results found for: NITRICOXIDE      Assessment & Plan:   OSA (obstructive sleep apnea) Excellent control and compliance continue on CPAP at bedtime  Plan Patient Instructions  Finish antibiotics as directed Mucinex DM twice daily as needed for cough and congestion Activity as tolerated.  Chest xray on return in 4-6 weeks and As needed     Continue on CPAP at bedtime Work on healthy weight. Do not drive if sleepy Keep up with good work .   Follow up in 4-6 weeks with Dr. [name]  With Chest xray and As needed       n   Pneumonia due to COVID-19 virus Covid pneumonia. Patient is clinically making slow recovery. She is to finish antibiotics as directed. We will check chest x-ray in 4 to 6 weeks.  Plan  Patient Instructions  Finish antibiotics as directed Mucinex DM twice daily as needed for cough and congestion Activity as tolerated.  Chest xray on return in 4-6 weeks and As needed     Continue on CPAP at bedtime Work on healthy weight. Do not drive if sleepy Keep up with good work .   Follow up in 4-6 weeks with Dr. [name]  With Chest xray and As needed       n'      Carlon Chaloux, NP 05/10/2020

## 2020-05-10 NOTE — Assessment & Plan Note (Signed)
Covid pneumonia. Patient is clinically making slow recovery. She is to finish antibiotics as directed. We will check chest x-ray in 4 to 6 weeks.  Plan  Patient Instructions  Finish antibiotics as directed Mucinex DM twice daily as needed for cough and congestion Activity as tolerated.  Chest xray on return in 4-6 weeks and As needed     Continue on CPAP at bedtime Work on healthy weight. Do not drive if sleepy Keep up with good work .   Follow up in 4-6 weeks with Dr. [name]  With Chest xray and As needed       n'

## 2020-05-10 NOTE — Patient Instructions (Addendum)
Finish antibiotics as directed Mucinex DM twice daily as needed for cough and congestion Activity as tolerated.  Chest xray on return in 4-6 weeks and As needed     Continue on CPAP at bedtime Work on healthy weight. Do not drive if sleepy Keep up with good work .   Follow up in 4-6 weeks with Dr. [name]  With Chest xray and As needed

## 2020-05-10 NOTE — Assessment & Plan Note (Signed)
Excellent control and compliance continue on CPAP at bedtime  Plan Patient Instructions  Finish antibiotics as directed Mucinex DM twice daily as needed for cough and congestion Activity as tolerated.  Chest xray on return in 4-6 weeks and As needed     Continue on CPAP at bedtime Work on healthy weight. Do not drive if sleepy Keep up with good work .   Follow up in 4-6 weeks with Dr. [name]  With Chest xray and As needed       n

## 2020-05-11 NOTE — Progress Notes (Signed)
Reviewed and agree with assessment/plan.   Chesley Mires, MD [facility] Pulmonary/Critical Care 05/11/2020, 8:36 AM Pager:  [phone]

## 2020-06-01 ENCOUNTER — Ambulatory Visit (INDEPENDENT_AMBULATORY_CARE_PROVIDER_SITE_OTHER): Payer: 59 | Admitting: Gastroenterology

## 2020-06-01 ENCOUNTER — Other Ambulatory Visit: Payer: Self-pay | Admitting: Gastroenterology

## 2020-06-01 ENCOUNTER — Encounter: Payer: Self-pay | Admitting: Gastroenterology

## 2020-06-01 VITALS — BP 124/72 | HR 71 | Ht 69.0 in | Wt 251.4 lb

## 2020-06-01 DIAGNOSIS — K219 Gastro-esophageal reflux disease without esophagitis: Secondary | ICD-10-CM

## 2020-06-01 DIAGNOSIS — R0789 Other chest pain: Secondary | ICD-10-CM | POA: Diagnosis not present

## 2020-06-01 DIAGNOSIS — Z6837 Body mass index (BMI) 37.0-37.9, adult: Secondary | ICD-10-CM

## 2020-06-01 DIAGNOSIS — E669 Obesity, unspecified: Secondary | ICD-10-CM

## 2020-06-01 DIAGNOSIS — Z86010 Personal history of colonic polyps: Secondary | ICD-10-CM | POA: Diagnosis not present

## 2020-06-01 MED ORDER — DEXLANSOPRAZOLE 60 MG PO CPDR
60.0000 mg | DELAYED_RELEASE_CAPSULE | Freq: Every day | ORAL | 1 refills | Status: DC
Start: 2020-06-01 — End: 2020-10-04

## 2020-06-01 MED ORDER — SUCRALFATE 1 GM/10ML PO SUSP: 1.0000 g | Freq: Four times a day (QID) | ORAL | 3 refills | Status: DC | PRN

## 2020-06-01 NOTE — Patient Instructions (Addendum)
If you are age 64 or older, your body mass index should be between 23-30. Your Body mass index is 37.13 kg/m. If this is out of the aforementioned range listed, please consider follow up with your Primary Care Provider.  If you are age 67 or younger, your body mass index should be between 19-25. Your Body mass index is 37.13 kg/m. If this is out of the aformentioned range listed, please consider follow up with your Primary Care Provider.   You have been scheduled for an endoscopy. Please follow written instructions given to you at your visit today. If you use inhalers (even only as needed), please bring them with you on the day of your procedure.   Discontinue Protonix.   We have sent the following medications to your pharmacy for you to pick up at your convenience: Dexilant 60 mg: Take once daily in the morning  Carafate suspension: Take 10 ml every 6 hours as needed   We will refer you to [facility]. They will contact you to schedule an appointment.   You will be due for a recall colonoscopy in 04-2022. We will send you a reminder in the mail when it gets closer to that time.   Thank you for entrusting me with your care and for choosing [facility], Dr. Silver Cliff Cellar

## 2020-06-01 NOTE — Progress Notes (Signed)
HPI :  65 year old female here for a follow-up visit for suspected GERD.  See prior office note September 2020 for details of her case.  At that time she was having intermittent chest discomfort with a negative cardiac evaluation and symptoms were most concerning for reflux.  I had recommended Protonix twice daily and Carafate and had recommended proceeding with EGD at that time.  She initially wanted to get the EGD done but did not follow through with getting the procedure done.  I have not seen her since that time.  She states she has continued to have chest discomfort.  This bothers her every day although severity can fluctuate.  Sometimes mild, sometimes more severe.  She had a severe episode that brought her to the hospital this past Friday when she was at work.  Developed severe chest pressure, called 911, taken to [facility] where she had negative cardiac enzymes and a negative CT angio.  She states she has a sense of globus that something can be sticking in her esophagus and feels like a pressure.  She feels it in the morning and then sometimes after eating.  When she eats her meals she denies any overt dysphagia and food goes down okay, but a few hours after she feels a pressure is of something is stuck there.  Sometimes belching will relieve this.  This is a similar symptom that she has had ongoing for years.  Friday it was worst after she thinks she ate spaghetti.  She thinks food with tomato sauce can often precipitate this.  She was given a GI cocktail in the ED which resolved her symptoms.  She has had problems with weight gain since Covid started, she has gained 20 to 30 pounds and is very frustrated by that.  She has been compliant with Protonix 40 mg twice daily but states it does not really help her anymore, she does not feel much of a difference being on it.  She states she was on Nexium in the past which she thought had worked better, although in reference to my prior note she  claimed that the last visit Nexium did not help her either..  She is prescribed Mobic and Advil but states she does not take these routinely and only rarely.  Her last endoscopy was 2007 when living in another area, no report on file.  She had a cardiac catheterization in 2019 which showed a normal EF and no evidence of coronary artery disease.  She otherwise denies any problems with her bowels.  She had colonoscopy with me in 2017 in which 2 small adenomas were removed.  Labs from [facility] ED visit reviewed and normal.  Prior workup: Cardiac cath 07/11/2017 - normal  Colonoscopy 04/27/2015 - A 75mm sessile polyp was noted in the hepatic flexure and removed via cold snare. A 64mm sessile polyp was noted in the splenic flexure and removed via cold snare. A 62mm sessile polyp was noted in the descending colon and removed via cold snare. Four x 65mm sessile polyps were noted in the rectum and 3 removed via cold snare, one removed via cold forceps given the snare could not grasp it. The remainder of the examined colon was normal. The ileum was normal. Retroflexed views revealed internal hemorrhoids. Path shows a few adenomas -  Surgical [P], hepatic flexure, splenic flexure, descending, polyp (6) - TUBULAR ADENOMA, 2 FRAGMENTS. - ADDITIONAL FRAGMENTS OF HYPERPLASTIC POLYP AND BENIGN COLORECTAL MUCOSA WITH ASSOCIATED BENIGN LYMPHOID AGGREGATES (REMAINING TISSUE). -  MELANOSIS COLI PRESENT. - NO HIGH GRADE DYSPLASIA OR MALIGNANCY IDENTIFIED.   Past Medical History:  Diagnosis Date  . COVID-19   . GERD (gastroesophageal reflux disease)   . Headache(784.0)   . Hypertension   . OSA (obstructive sleep apnea) 08/28/2017  . Pneumonia      Past Surgical History:  Procedure Laterality Date  . LEFT HEART CATH AND CORONARY ANGIOGRAPHY N/A 07/11/2017   Procedure: LEFT HEART CATH AND CORONARY ANGIOGRAPHY;  Surgeon: Martinique, Peter M, MD;  Location: [facility];  Service: Cardiovascular;  Laterality:  N/A;  . LEFT HEART CATHETERIZATION WITH CORONARY ANGIOGRAM N/A 05/11/2013   Procedure: LEFT HEART CATHETERIZATION WITH CORONARY ANGIOGRAM;  Surgeon: Burnell Blanks, MD;  Location: [facility] CATH LAB;  Service: Cardiovascular;  Laterality: N/A;  . neck fusion     Family History  Problem Relation Age of Onset  . Lung cancer Mother   . Stomach cancer Father   . Heart disease Brother        Pacemaker  . Esophageal cancer Brother   . Breast cancer Sister   . Colon cancer Neg Hx   . Pancreatic cancer Neg Hx   . Liver disease Neg Hx    Social History   Tobacco Use  . Smoking status: Former Smoker    Packs/day: 0.25    Years: 16.00    Pack years: 4.00    Types: Cigarettes    Quit date: 03/09/2018    Years since quitting: 2.2  . Smokeless tobacco: Never Used  Vaping Use  . Vaping Use: Never used  Substance Use Topics  . Alcohol use: Yes    Comment: socially  . Drug use: No   Current Outpatient Medications  Medication Sig Dispense Refill  . albuterol (VENTOLIN HFA) 108 (90 Base) MCG/ACT inhaler Inhale 1-2 puffs into the lungs every 6 (six) hours as needed for wheezing or shortness of breath. 6.7 g 0  . amLODipine (NORVASC) 5 MG tablet TAKE 1 TABLET BY MOUTH EVERY DAY 90 tablet 2  . atorvastatin (LIPITOR) 20 MG tablet Take 1 tablet (20 mg total) by mouth daily. 90 tablet 1  . fluticasone (FLONASE) 50 MCG/ACT nasal spray Place 1 spray into both nostrils 2 (two) times daily as needed for allergies or rhinitis. 16 g 5  . hydrochlorothiazide (HYDRODIURIL) 25 MG tablet TAKE 1 TABLET BY MOUTH EVERY DAY 30 tablet 3  . ibuprofen (ADVIL) 600 MG tablet Take 1 tablet (600 mg total) by mouth every 8 (eight) hours as needed for moderate pain. 60 tablet 1  . meclizine (ANTIVERT) 25 MG tablet Take 1 tablet (25 mg total) by mouth 3 (three) times daily as needed for dizziness. 30 tablet 0  . meloxicam (MOBIC) 15 MG tablet Take 1 tablet (15 mg total) by mouth daily. 30 tablet 1  . metoprolol tartrate  (LOPRESSOR) 25 MG tablet TAKE 0.5 TABLETS (12.5 MG TOTAL) BY MOUTH 2 (TWO) TIMES DAILY. 90 tablet 1  . pantoprazole (PROTONIX) 40 MG tablet Take 1 tablet (40 mg total) by mouth 2 (two) times daily before a meal. Please schedule a yearly follow up for further refills. Thank you 60 tablet 2  . triamcinolone cream (KENALOG) 0.1 % Apply 1 application topically 2 (two) times daily as needed. 45 g 1  . valACYclovir (VALTREX) 500 MG tablet 1 TAB TWICE DAILY FOR 3 DAYS WITH OUTBREAKS, START WITHIN 48 HOURS AFTER ONSET. (Patient taking differently: Take 500 mg by mouth 2 (two) times daily. FOR 3 DAYS  WITH OUTBREAKS, START WITHIN 48 HOURS AFTER ONSET.) 18 tablet 1   No current facility-administered medications for this visit.   Allergies  Allergen Reactions  . Other Anaphylaxis, Shortness Of Breath and Swelling    [place] NUTS     Review of Systems: All systems reviewed and negative except where noted in HPI.    Lab Results  Component Value Date   WBC 5.8 04/11/2020   HGB 14.5 04/11/2020   HCT 43.2 04/11/2020   MCV 90.4 04/11/2020   PLT 307 04/11/2020    Lab Results  Component Value Date   CREATININE 0.84 04/11/2020   BUN 10 04/11/2020   NA 140 04/11/2020   K 4.3 05/05/2020   CL 101 04/11/2020   CO2 27 04/11/2020    Lab Results  Component Value Date   ALT 24 04/11/2020   AST 27 04/11/2020   ALKPHOS 63 04/11/2020   BILITOT 0.4 04/11/2020     Physical Exam: BP 124/72   Pulse 71   Ht 5\' 9"  (1.753 m)   Wt 251 lb 6.4 oz (114 kg)   LMP 01/07/2010 (LMP Unknown)   SpO2 97%   BMI 37.13 kg/m  Constitutional: Pleasant,well-developed, female in no acute distress. HEENT: Normocephalic and atraumatic. Conjunctivae are normal. No scleral icterus. Neck supple.  Cardiovascular: Normal rate, regular rhythm.  Pulmonary/chest: Effort normal and breath sounds normal.  Abdominal: Soft, nondistended, nontender.  There are no masses palpable.  Extremities: no edema Lymphadenopathy: No  cervical adenopathy noted. Neurological: Alert and oriented to person place and time. Skin: Skin is warm and dry. No rashes noted. Psychiatric: Normal mood and affect. Behavior is normal.   ASSESSMENT AND PLAN: 65 y/o female here for reassessment of the following issues:  Atypical chest pain GERD Obesity History of colon polyps  Continues to have intermittent chest discomfort as outlined above with a negative cardiac work-up, negative CT angio recently with flare of symptoms.  Reflux could certainly be causing the symptoms versus esophageal spasm.  Unfortunately it sounds as though Protonix initially did help but effect has waned and since not providing much benefit.  She has gotten better with a GI cocktail in the ED.  Discussed options with her.  I am recommending an upper endoscopy to further evaluate her symptoms as has been several years since her last exam and she's had numerous ED visits for this issue.  Will assess for erosive esophagitis on PPI, large hiatal hernia, EOE, Barrett's esophagus, etc.  I do not think she is having true dysphagia based on her description of symptoms today, more so globus.  While awaiting EGD will stop Protonix and transition to a trial of Dexilant 60 mg a day to see if that will provide any additional benefit.  She can continue Carafate liquid 10 cc every 6 hours as needed for breakthrough.  Otherwise she has had significant weight gain which can make reflux worse and we discussed this.  She is frustrated by her weight gain, will refer her to the weight loss clinic to help with strategies to deal with this.  Otherwise, will change recall for colonoscopy to January 2024 based on updated guidelines for polyp surveillance (7 years from her last exam based on 2 small adenomas).  She agreed with the plan as outlined, further recommendations pending results.  Plan: - EGD - stop protonix - start Dexilant 60mg  / day - start liquid carafate 10cc po every 6 hours PRN -  refer to weight loss clinic for weight gain /  obesity - change recall colonoscopy to 04/2022 based on updated surveillance guidelines  [place] Cellar, MD [facility]

## 2020-06-02 ENCOUNTER — Other Ambulatory Visit: Payer: Self-pay

## 2020-06-02 MED ORDER — SUCRALFATE 1 G PO TABS: 1.0000 g | ORAL_TABLET | Freq: Four times a day (QID) | ORAL | 3 refills | Status: AC | PRN

## 2020-06-10 ENCOUNTER — Telehealth (INDEPENDENT_AMBULATORY_CARE_PROVIDER_SITE_OTHER): Payer: 59 | Admitting: Family Medicine

## 2020-06-10 ENCOUNTER — Encounter: Payer: Self-pay | Admitting: Family Medicine

## 2020-06-10 DIAGNOSIS — H9201 Otalgia, right ear: Secondary | ICD-10-CM

## 2020-06-10 DIAGNOSIS — R42 Dizziness and giddiness: Secondary | ICD-10-CM

## 2020-06-10 MED ORDER — AMOXICILLIN 500 MG PO TABS: 500.0000 mg | ORAL_TABLET | Freq: Two times a day (BID) | ORAL | 0 refills | Status: AC

## 2020-06-10 NOTE — Progress Notes (Signed)
Virtual Visit via Telephone Note  I connected with Brianna Foster on 06/10/20 at  4:00 PM EST by telephone and verified that I am speaking with the correct person using two identifiers.   I discussed the limitations, risks, security and privacy concerns of performing an evaluation and management service by telephone and the availability of in person appointments. I also discussed with the patient that there may be a patient responsible charge related to this service. The patient expressed understanding and agreed to proceed.  Location patient: home Location provider: work or home office Participants present for the call: patient, provider Patient did not have a visit in the prior 7 days to address this/these issue(s).   History of Present Illness: Pt is a 65 yo female with pmh sig for HTN, unstable angina, OSA, allergic rhinitis, GERD, eczema, HSV, HLD who is followed by Dr. Martinique and seen for acute concern.  Patient states R ear began ringing then turned into pain x 1 wk.  The discomfort caused vertigo symptoms.  Patient endorses relief with Tylenol and Antivert.  Also had sore throat on Monday.  Typically has allergy symptoms when pollen is present.  Tried Claritin and Flonase x1 day.  Pt denies headaches, fever, rhinorrhea, cough, sick contacts.  Pt concerned about symptoms as she is traveling on Monday.   Observations/Objective: Patient sounds cheerful and well on the phone. I do not appreciate any SOB. Speech and thought processing are grossly intact. Patient reported vitals:  Assessment and Plan: Right ear pain -Reiterated the limitations of a phone or e-visit for evaluation of ear pain. -Discussed possible causes including seasonal allergies, eustachian tube dysfunction, or AOM -Discussed consistent use of Flonase and Claritin to help alleviate symptoms. -Continue supportive care -We will send Rx for amoxicillin as a wait-and-see prescription.  Patient to pick up on Sunday for  continued symptoms. -Given precautions  Vertigo -Continue Antivert as needed  Follow Up Instructions:  Follow-up as needed  I did not refer this patient for an OV in the next 24 hours for this/these issue(s).  I discussed the assessment and treatment plan with the patient. The patient was provided an opportunity to ask questions and all were answered. The patient agreed with the plan and demonstrated an understanding of the instructions.   The patient was advised to call back or seek an in-person evaluation if the symptoms worsen or if the condition fails to improve as anticipated.  I provided 10 minutes of non-face-to-face time during this encounter.   Billie Ruddy, MD

## 2020-06-16 ENCOUNTER — Ambulatory Visit (AMBULATORY_SURGERY_CENTER): Payer: 59 | Admitting: Gastroenterology

## 2020-06-16 ENCOUNTER — Encounter: Payer: Self-pay | Admitting: Gastroenterology

## 2020-06-16 ENCOUNTER — Other Ambulatory Visit: Payer: Self-pay

## 2020-06-16 VITALS — BP 153/83 | HR 66 | Temp 97.7°F | Resp 17 | Ht 69.0 in | Wt 251.0 lb

## 2020-06-16 DIAGNOSIS — R0789 Other chest pain: Secondary | ICD-10-CM

## 2020-06-16 DIAGNOSIS — K219 Gastro-esophageal reflux disease without esophagitis: Secondary | ICD-10-CM

## 2020-06-16 MED ORDER — SODIUM CHLORIDE 0.9 % IV SOLN: 500.0000 mL | Freq: Once | INTRAVENOUS | Status: DC

## 2020-06-16 NOTE — Progress Notes (Signed)
VS taken by C.W. 

## 2020-06-16 NOTE — Progress Notes (Signed)
Called to room to assist during endoscopic procedure.  Patient ID and intended procedure confirmed with present staff. Received instructions for my participation in the procedure from the performing physician.  

## 2020-06-16 NOTE — Progress Notes (Signed)
A/ox3, pleased with MAC, report to RN 

## 2020-06-16 NOTE — Patient Instructions (Signed)
Read all of the handouts given to you by your recovory room nurse.  YOU HAD AN ENDOSCOPIC PROCEDURE TODAY AT [facility]:   Refer to the procedure report that was given to you for any specific questions about what was found during the examination.  If the procedure report does not answer your questions, please call your gastroenterologist to clarify.  If you requested that your care partner not be given the details of your procedure findings, then the procedure report has been included in a sealed envelope for you to review at your convenience later.  YOU SHOULD EXPECT: Some feelings of bloating in the abdomen. Passage of more gas than usual.  Walking can help get rid of the air that was put into your GI tract during the procedure and reduce the bloating.  Please Note:  You might notice some irritation and congestion in your nose or some drainage.  This is from the oxygen used during your procedure.  There is no need for concern and it should clear up in a day or so.  SYMPTOMS TO REPORT IMMEDIATELY:    Following upper endoscopy (EGD)  Vomiting of blood or coffee ground material  New chest pain or pain under the shoulder blades  Painful or persistently difficult swallowing  New shortness of breath  Fever of 100F or higher  Black, tarry-looking stools  For urgent or emergent issues, a gastroenterologist can be reached at any hour by calling [phone]. Do not use MyChart messaging for urgent concerns.    DIET:  We do recommend a small meal at first, but then you may proceed to your regular diet.  Drink plenty of fluids but you should avoid alcoholic beverages for 24 hours.  ACTIVITY:  You should plan to take it easy for the rest of today and you should NOT DRIVE or use heavy machinery until tomorrow (because of the sedation medicines used during the test).    FOLLOW UP: Our staff will call the number listed on your records 48-72 hours following your procedure to check  on you and address any questions or concerns that you may have regarding the information given to you following your procedure. If we do not reach you, we will leave a message.  We will attempt to reach you two times.  During this call, we will ask if you have developed any symptoms of COVID 19. If you develop any symptoms (ie: fever, flu-like symptoms, shortness of breath, cough etc.) before then, please call [phone].  If you test positive for Covid 19 in the 2 weeks post procedure, please call and report this information to [place].    If any biopsies were taken you will be contacted by phone or by letter within the next 1-3 weeks.  Please call us at [phone] if you have not heard about the biopsies in 3 weeks.    SIGNATURES/CONFIDENTIALITY: You and/or your care partner have signed paperwork which will be entered into your electronic medical record.  These signatures attest to the fact that that the information above on your After Visit Summary has been reviewed and is understood.  Full responsibility of the confidentiality of this discharge information lies with you and/or your care-partner.

## 2020-06-16 NOTE — Op Note (Signed)
[facility] Patient Name: Danuta Huseman Procedure Date: 06/16/2020 4:06 PM MRN: [id number] Endoscopist: Remo Lipps P. Havery Moros , MD Age: 65 Referring MD:  Date of Birth: [birth year] Gender: Female Account #: [id number] Procedure:                Upper GI endoscopy Indications:              Atypical / unexplained chest pain, possible                            underlying GERD, globus - placed on Dexilant /                            carafate with some improvement Medicines:                Monitored Anesthesia Care Procedure:                Pre-Anesthesia Assessment:                           - Prior to the procedure, a History and Physical                            was performed, and patient medications and                            allergies were reviewed. The patient's tolerance of                            previous anesthesia was also reviewed. The risks                            and benefits of the procedure and the sedation                            options and risks were discussed with the patient.                            All questions were answered, and informed consent                            was obtained. Prior Anticoagulants: The patient has                            taken no previous anticoagulant or antiplatelet                            agents. ASA Grade Assessment: III - A patient with                            severe systemic disease. After reviewing the risks                            and benefits, the patient was deemed in  satisfactory condition to undergo the procedure.                           After obtaining informed consent, the endoscope was                            passed under direct vision. Throughout the                            procedure, the patient's blood pressure, pulse, and                            oxygen saturations were monitored continuously. The                            Endoscope was  introduced through the mouth, and                            advanced to the second part of duodenum. The upper                            GI endoscopy was accomplished without difficulty.                            The patient tolerated the procedure well. Scope In: Scope Out: Findings:                 Esophagogastric landmarks were identified: the                            Z-line was found at 39 cm, the gastroesophageal                            junction was found at 39 cm and the upper extent of                            the gastric folds was found at 40 cm from the                            incisors.                           A 1 cm hiatal hernia was present.                           The exam of the esophagus was otherwise normal.                           Biopsies were taken with a cold forceps in the                            upper third of the esophagus, in the middle third  of the esophagus and in the lower third of the                            esophagus for histology to rule out eosinophilic                            esophagitis.                           The entire examined stomach was normal.                           The duodenal bulb and second portion of the                            duodenum were normal. Complications:            No immediate complications. Estimated blood loss:                            Minimal. Estimated Blood Loss:     Estimated blood loss was minimal. Impression:               - Esophagogastric landmarks identified.                           - 1 cm hiatal hernia.                           - Normal esophagus otherwise - biopsies obtained.                           - Normal stomach.                           - Normal duodenal bulb and second portion of the                            duodenum.                           No erosive changes noted on PPI, however nonerosive                            reflux remains  possible as possible cause vs.                            esophageal spasm. Recommendation:           - Patient has a contact number available for                            emergencies. The signs and symptoms of potential                            delayed complications were discussed with the  patient. Return to normal activities tomorrow.                            Written discharge instructions were provided to the                            patient.                           - Resume previous diet.                           - Continue present medications.                           - Await pathology results.                           - If symptoms persist, consideration for 24 hour pH                            impedance testing Remo Lipps P. Shailen Thielen, MD 06/16/2020 4:26:04 PM This report has been signed electronically.

## 2020-06-20 ENCOUNTER — Ambulatory Visit: Payer: 59 | Admitting: Pulmonary Disease

## 2020-06-21 ENCOUNTER — Telehealth: Payer: Self-pay

## 2020-06-21 NOTE — Telephone Encounter (Signed)
  Follow up Call-  Call back number 06/16/2020  Post procedure Call Back phone  # [phone]  Permission to leave phone message Yes  Some recent data might be hidden     Patient questions:  Do you have a fever, pain , or abdominal swelling? No. Pain Score  0 *  Have you tolerated food without any problems? Yes.    Have you been able to return to your normal activities? Yes.    Do you have any questions about your discharge instructions: Diet   No. Medications  No. Follow up visit  No.  Do you have questions or concerns about your Care? No.  Actions: * If pain score is 4 or above: No action needed, pain <4.  1. Have you developed a fever since your procedure? no  2.   Have you had an respiratory symptoms (SOB or cough) since your procedure? no  3.   Have you tested positive for COVID 19 since your procedure no  4.   Have you had any family members/close contacts diagnosed with the COVID 19 since your procedure?  no   If yes to any of these questions please route to Joylene John, RN and Joella Prince, RN

## 2020-07-02 ENCOUNTER — Other Ambulatory Visit: Payer: Self-pay | Admitting: Family Medicine

## 2020-07-02 DIAGNOSIS — A6000 Herpesviral infection of urogenital system, unspecified: Secondary | ICD-10-CM

## 2020-07-13 ENCOUNTER — Ambulatory Visit (INDEPENDENT_AMBULATORY_CARE_PROVIDER_SITE_OTHER): Payer: 59 | Admitting: Pulmonary Disease

## 2020-07-13 ENCOUNTER — Encounter: Payer: Self-pay | Admitting: Pulmonary Disease

## 2020-07-13 ENCOUNTER — Ambulatory Visit (INDEPENDENT_AMBULATORY_CARE_PROVIDER_SITE_OTHER): Payer: 59

## 2020-07-13 ENCOUNTER — Other Ambulatory Visit: Payer: Self-pay

## 2020-07-13 VITALS — BP 134/82 | HR 61 | Temp 97.7°F | Ht 69.0 in | Wt 260.6 lb

## 2020-07-13 DIAGNOSIS — G4733 Obstructive sleep apnea (adult) (pediatric): Secondary | ICD-10-CM | POA: Diagnosis not present

## 2020-07-13 DIAGNOSIS — Z8616 Personal history of COVID-19: Secondary | ICD-10-CM

## 2020-07-13 DIAGNOSIS — J1282 Pneumonia due to coronavirus disease 2019: Secondary | ICD-10-CM

## 2020-07-13 DIAGNOSIS — U071 COVID-19: Secondary | ICD-10-CM | POA: Diagnosis not present

## 2020-07-13 DIAGNOSIS — G4730 Sleep apnea, unspecified: Secondary | ICD-10-CM | POA: Diagnosis not present

## 2020-07-13 DIAGNOSIS — E669 Obesity, unspecified: Secondary | ICD-10-CM | POA: Diagnosis not present

## 2020-07-13 NOTE — Patient Instructions (Signed)
Follow up in 1 year.

## 2020-07-13 NOTE — Progress Notes (Signed)
[facility]  Chief Complaint  Patient presents with  . Follow-up    "My breathing is a lot better"    Constitutional:  BP 134/82 (BP Location: Left Arm, Cuff Size: Normal)   Pulse 61   Temp 97.7 F (36.5 C) (Temporal)   Ht 5\' 9"  (1.753 m)   Wt 260 lb 9.6 oz (118.2 kg)   LMP 01/07/2010 (LMP Unknown)   SpO2 97% Comment: Room air  BMI 38.48 kg/m   Past Medical History:  HTN, HA, GERD, COVID 19 Pneumonia December 2021  Past Surgical History:  She  has a past surgical history that includes neck fusion; left heart catheterization with coronary angiogram (N/A, 05/11/2013); and LEFT HEART CATH AND CORONARY ANGIOGRAPHY (N/A, 07/11/2017).  Brief Summary:  Brianna Foster is a 65 y.o. female with obstructive sleep apnea.      Subjective:   She saw Tammy Parrett in February after having COVID pneumonia in December 2021.  CT angio chest from 05/27/20 was normal.  Chest xray today was normal.  She is no longer having cough.  She still gets winded some, but activity level is improving.  Uses CPAP nightly.  Has nasal pillows mask.  No having sinus congestion, sore throat, or dry mouth.    She is worried about weight gain over the past 1 year.  Physical Exam:   Appearance - well kempt   ENMT - no sinus tenderness, no oral exudate, no LAN, Mallampati 3 airway, no stridor  Respiratory - equal breath sounds bilaterally, no wheezing or rales  CV - s1s2 regular rate and rhythm, no murmurs  Ext - no clubbing, no edema  Skin - no rashes  Psych - normal mood and affect   Sleep Tests:   HST 08/26/17 >> AHI 30.3, SaO2 low 71%.  HST 12/21/18 >> AHI 59.9, SpO2 low 76%  Auto CPAP 06/12/20 to 07/11/20 >> used on 30 of 30 nights with average 9 hrs 19 min.  Average AHI 0.5 with median CPAP 8 and 95 th percentile CPAP 10 cm H2O  Social History:  She  reports that she quit smoking about 2 years ago. Her smoking use included cigarettes. She has a  4.00 pack-year smoking history. She has never used smokeless tobacco. She reports current alcohol use. She reports that she does not use drugs.  Family History:  Her family history includes Breast cancer in her sister; Esophageal cancer in her brother; Heart disease in her brother; Lung cancer in her mother; Stomach cancer in her father.     Assessment/Plan:   Obstructive sleep apnea. - she is compliant with CPAP and reports benefit from therapy - she uses [employer] for her DME - continue auto CPAP 5 to 15 cm H2O - she will look up mask options online and call if she finds a mask she would like to switch to  Obesity. - discussed importance of weight loss - she will f/u with her PCP to have further assessment of recent weight gain  History of COVID 19 pneumonia. - clinically improved, and chest xray normal  Time Spent Involved in Patient Care on Day of Examination:  23 minutes  Follow up:  Patient Instructions  Follow up in 1 year  Medication List:   Allergies as of 07/13/2020      Reactions   Other Anaphylaxis, Shortness Of Breath, Swelling   [place] NUTS      Medication List       Accurate  as of July 13, 2020  9:57 AM. If you have any questions, ask your nurse or doctor.        albuterol 108 (90 Base) MCG/ACT inhaler Commonly known as: VENTOLIN HFA Inhale 1-2 puffs into the lungs every 6 (six) hours as needed for wheezing or shortness of breath.   amLODipine 5 MG tablet Commonly known as: NORVASC TAKE 1 TABLET BY MOUTH EVERY DAY   atorvastatin 20 MG tablet Commonly known as: LIPITOR Take 1 tablet (20 mg total) by mouth daily.   dexlansoprazole 60 MG capsule Commonly known as: Dexilant Take 1 capsule (60 mg total) by mouth daily.   fluticasone 50 MCG/ACT nasal spray Commonly known as: Flonase Place 1 spray into both nostrils 2 (two) times daily as needed for allergies or rhinitis.   hydrochlorothiazide 25 MG tablet Commonly known as: HYDRODIURIL TAKE 1  TABLET BY MOUTH EVERY DAY   ibuprofen 600 MG tablet Commonly known as: ADVIL Take 1 tablet (600 mg total) by mouth every 8 (eight) hours as needed for moderate pain.   meclizine 25 MG tablet Commonly known as: ANTIVERT Take 1 tablet (25 mg total) by mouth 3 (three) times daily as needed for dizziness.   meloxicam 15 MG tablet Commonly known as: MOBIC Take 1 tablet (15 mg total) by mouth daily.   metoprolol tartrate 25 MG tablet Commonly known as: LOPRESSOR TAKE 0.5 TABLETS (12.5 MG TOTAL) BY MOUTH 2 (TWO) TIMES DAILY.   sucralfate 1 g tablet Commonly known as: Carafate Take 1 tablet (1 g total) by mouth every 6 (six) hours as needed. Slowly dissolve 1 tablet in 1 Tablespoon of distilled water before ingesting   triamcinolone 0.1 % Commonly known as: KENALOG Apply 1 application topically 2 (two) times daily as needed.   valACYclovir 500 MG tablet Commonly known as: VALTREX 1 TAB TWICE DAILY FOR 3 DAYS WITH OUTBREAKS, START WITHIN 48 HOURS AFTER ONSET.       Signature:  Chesley Mires, MD [place] Pager - [phone] 07/13/2020, 9:57 AM

## 2020-07-26 ENCOUNTER — Encounter: Payer: Self-pay | Admitting: Gastroenterology

## 2020-08-02 ENCOUNTER — Ambulatory Visit (INDEPENDENT_AMBULATORY_CARE_PROVIDER_SITE_OTHER): Payer: 59

## 2020-08-02 ENCOUNTER — Other Ambulatory Visit: Payer: Self-pay

## 2020-08-02 ENCOUNTER — Encounter: Payer: Self-pay | Admitting: Family Medicine

## 2020-08-02 ENCOUNTER — Ambulatory Visit (INDEPENDENT_AMBULATORY_CARE_PROVIDER_SITE_OTHER): Payer: 59 | Admitting: Family Medicine

## 2020-08-02 VITALS — BP 130/80 | HR 78 | Resp 16 | Ht 69.0 in | Wt 253.5 lb

## 2020-08-02 DIAGNOSIS — Z780 Asymptomatic menopausal state: Secondary | ICD-10-CM

## 2020-08-02 DIAGNOSIS — I10 Essential (primary) hypertension: Secondary | ICD-10-CM

## 2020-08-02 DIAGNOSIS — Z130 Encounter for screening for diseases of the blood and blood-forming organs and certain disorders involving the immune mechanism: Secondary | ICD-10-CM

## 2020-08-02 DIAGNOSIS — Z1159 Encounter for screening for other viral diseases: Secondary | ICD-10-CM

## 2020-08-02 DIAGNOSIS — Z0001 Encounter for general adult medical examination with abnormal findings: Secondary | ICD-10-CM

## 2020-08-02 DIAGNOSIS — G8929 Other chronic pain: Secondary | ICD-10-CM

## 2020-08-02 DIAGNOSIS — Z13228 Encounter for screening for other metabolic disorders: Secondary | ICD-10-CM | POA: Diagnosis not present

## 2020-08-02 DIAGNOSIS — M545 Low back pain, unspecified: Secondary | ICD-10-CM

## 2020-08-02 DIAGNOSIS — H8110 Benign paroxysmal vertigo, unspecified ear: Secondary | ICD-10-CM | POA: Diagnosis not present

## 2020-08-02 DIAGNOSIS — R600 Localized edema: Secondary | ICD-10-CM

## 2020-08-02 DIAGNOSIS — Z1329 Encounter for screening for other suspected endocrine disorder: Secondary | ICD-10-CM

## 2020-08-02 DIAGNOSIS — R350 Frequency of micturition: Secondary | ICD-10-CM | POA: Diagnosis not present

## 2020-08-02 DIAGNOSIS — E785 Hyperlipidemia, unspecified: Secondary | ICD-10-CM | POA: Diagnosis not present

## 2020-08-02 DIAGNOSIS — Z0000 Encounter for general adult medical examination without abnormal findings: Secondary | ICD-10-CM

## 2020-08-02 LAB — BASIC METABOLIC PANEL
BUN: 18 mg/dL (ref 6–23)
CO2: 27 mEq/L (ref 19–32)
Calcium: 9.6 mg/dL (ref 8.4–10.5)
Chloride: 101 mEq/L (ref 96–112)
Creatinine, Ser: 0.93 mg/dL (ref 0.40–1.20)
GFR: 65.44 mL/min (ref >60.00)
Glucose, Bld: 105 mg/dL — ABNORMAL HIGH (ref 70–99)
Potassium: 4.3 mEq/L (ref 3.5–5.1)
Sodium: 137 mEq/L (ref 135–145)

## 2020-08-02 LAB — URINALYSIS, ROUTINE W REFLEX MICROSCOPIC
Bilirubin Urine: NEGATIVE
Hgb urine dipstick: NEGATIVE
Ketones, ur: NEGATIVE
Nitrite: NEGATIVE
RBC / HPF: NONE SEEN (ref 0–?)
Specific Gravity, Urine: 1.01 (ref 1.000–1.030)
Total Protein, Urine: NEGATIVE
Urine Glucose: NEGATIVE
Urobilinogen, UA: 0.2 (ref 0.0–1.0)
pH: 6 (ref 5.0–8.0)

## 2020-08-02 LAB — LIPID PANEL
Cholesterol: 177 mg/dL (ref 0–200)
HDL: 43.9 mg/dL (ref >39.00)
LDL Cholesterol: 111 mg/dL — ABNORMAL HIGH (ref 0–99)
NonHDL: 132.91
Total CHOL/HDL Ratio: 4
Triglycerides: 109 mg/dL (ref 0.0–149.0)
VLDL: 21.8 mg/dL (ref 0.0–40.0)

## 2020-08-02 LAB — TSH: TSH: 1.84 u[IU]/mL (ref 0.35–4.50)

## 2020-08-02 LAB — HEMOGLOBIN A1C: Hgb A1c MFr Bld: 5.7 % (ref 4.6–6.5)

## 2020-08-02 MED ORDER — AMLODIPINE BESYLATE 5 MG PO TABS: 1.0000 | ORAL_TABLET | Freq: Every day | ORAL | 2 refills | Status: DC

## 2020-08-02 MED ORDER — HYDROCHLOROTHIAZIDE 25 MG PO TABS: 1.0000 | ORAL_TABLET | Freq: Every day | ORAL | 2 refills | Status: DC

## 2020-08-02 MED ORDER — MECLIZINE HCL 25 MG PO TABS: 25.0000 mg | ORAL_TABLET | Freq: Every day | ORAL | 2 refills | Status: DC | PRN

## 2020-08-02 NOTE — Patient Instructions (Addendum)
Today you have you routine preventive visit. A few things to remember from today's visit:   Routine general medical examination at a health care facility  Hyperlipidemia, unspecified hyperlipidemia type - Plan: Lipid panel  Essential hypertension - Plan: TSH, hydrochlorothiazide (HYDRODIURIL) 25 MG tablet, amLODipine (NORVASC) 5 MG tablet  Encounter for HCV screening test for low risk patient - Plan: Hepatitis C antibody screen  Benign paroxysmal positional vertigo, unspecified laterality - Plan: meclizine (ANTIVERT) 25 MG tablet  Bilateral lower extremity edema - Plan: hydrochlorothiazide (HYDRODIURIL) 25 MG tablet  Asymptomatic postmenopausal estrogen deficiency - Plan: DEXAScan  Screening for endocrine, metabolic and immunity disorder - Plan: Basic metabolic panel, Hemoglobin A1c  Chronic left-sided low back pain without sciatica - Plan: DG Lumbar Spine Complete  Urinary frequency - Plan: Urinalysis, Routine w reflex microscopic  If you need refills please call your pharmacy. Do not use My Chart to request refills or for acute issues that need immediate attention.    Please be sure medication list is accurate. If a new problem present, please set up appointment sooner than planned today.  At least 150 minutes of moderate exercise per week, daily brisk walking for 15-30 min is a good exercise option. Healthy diet low in saturated (animal) fats and sweets and consisting of fresh fruits and vegetables, lean meats such as fish and white chicken and whole grains.  These are some of recommendations for screening depending of age and risk factors:  - Vaccines:  Tdap vaccine every 10 years.  Shingles vaccine recommended at age 41, could be given after 64 years of age but not sure about insurance coverage.   Pneumonia vaccines: Pneumovax at 24. Sometimes Pneumovax is giving earlier if history of smoking, lung disease,diabetes,kidney disease among some.  Screening for diabetes at  age 35 and every 3 years.  Cervical cancer prevention:  Pap smear starts at 64 years of age and continues periodically until 64 years old in low risk women. Pap smear every 3 years between 59 and 20 years old. Pap smear every 3-5 years between women 62 and older if pap smear negative and HPV screening negative.   -Breast cancer: Mammogram: There is disagreement between experts about when to start screening in low risk asymptomatic female but recent recommendations are to start screening at 31 and not later than 64 years old , every 1-2 years and after 64 yo q 2 years. Screening is recommended until 64 years old but some women can continue screening depending of healthy issues.  Colon cancer screening: Has been recently changed to 63 yo. Insurance may not cover until you are 64 years old. Screening is recommended until 64 years old.  Cholesterol disorder screening at age 46 and every 3 years.N/A  Also recommended:  1. Dental visit- Brush and floss your teeth twice daily; visit your dentist twice a year. 2. Eye doctor- Get an eye exam at least every 2 years. 3. Helmet use- Always wear a helmet when riding a bicycle, motorcycle, rollerblading or skateboarding. 4. Safe sex- If you may be exposed to sexually transmitted infections, use a condom. 5. Seat belts- Seat belts can save your live; always wear one. 6. Smoke/Carbon Monoxide detectors- These detectors need to be installed on the appropriate level of your home. Replace batteries at least once a year. 7. Skin cancer- When out in the sun please cover up and use sunscreen 15 SPF or higher. 8. Violence- If anyone is threatening or hurting you, please tell your healthcare provider.  9. Drink alcohol in moderation- Limit alcohol intake to one drink or less per day. Never drink and drive. 10. Calcium supplementation 1000 to 1200 mg daily, ideally through your diet.  Vitamin D supplementation 800 units daily.  This exercise helps with mild urine  leakage associated with cough, laughing, or sneezing. It may help with other types of urine incontinence.   Tighten and relax the pelvic muscles intermittently during the day. Once you are familiar with exercise try to hold pelvic muscles contraction for about 8-10 seconds.in the beginning you may not be able to hold contraction for more than a second or 2 but eventually you will be able to hold contraction harder and for longer time. Perform  8-12 exercises 3 times per day and daily for 15-20 weeks. You will need to continue exercises indefinitely to have a lasting effect.

## 2020-08-02 NOTE — Progress Notes (Signed)
HPI: BriannaBrianna Foster is a 65 y.o. female, who is here today for her routine physical.  Last CPE: 05/08/16.  Regular exercise 3 or more time per week: She has not been consistent but planning on starting an exercise routine. Following a healthful diet: About a week ago she started [employer], 2 smoothies per day, she has lost about 7 pounds.  She is planning on completing 30 days with this program and then continue with a less rigid dietary regimen.  Chronic medical problems: Hypertension, OSA on CPAP, hyperlipidemia, GERD, and allergies among some. She recently saw her GI, Dexilant was recommended but due to the cost she continue taking pantoprazole 40 mg twice daily.  Pap smear:07/20/19 She sees her gyn regularly.  Immunization History  Administered Date(s) Administered  . Influenza Split 02/03/2008  . PFIZER(Purple Top)SARS-COV-2 Vaccination 06/14/2019, 07/07/2019  . Tdap 12/11/2016   Mammogram 06/08/19. Colonoscopy 04/27/15, polypectomy x 7. DEXA: N/A She is not on Ca++ supplementation and takes vit D 5000 U daily. Hep C screening: Never.  She has some concerns she would like to address today. Back pain: She has had problem intermittently for years but seems to be getting worse for the past few weeks. Sharp pain, 10/10. Left-sided back pain, it is not radiated. Negative for LE numbness, tingling, saddle anesthesia, or bladder/bowel dysfunction. Pain is exacerbated by prolonged standing/walking and with movement after prolonged rest. + Stiffness. She has taken ibuprofen and Tylenol.  She thinks wt gained has contributed to her back pain. Steadily gaining wt since she recovered from [place] 19 infection. She has not been consistent with following a healthful diet or with regular physical activity.  Hyperlipidemia: Currently she is on atorvastatin 20 mg daily. Medication was last refilled in 2019.  Lab Results  Component Value Date   CHOL 142 07/12/2017   HDL 42  07/12/2017   LDLCALC 77 07/12/2017   LDLDIRECT 145.2 07/23/2012   TRIG 113 07/12/2017   CHOLHDL 3.4 07/12/2017   She has noted LE edema for the past few days, L>R, worse at the end f the day. Negative for LE pain or erythema. She needs refills on HCTZ, which ahs helped. Urinary frequency: Problem has been going on for a while. Nocturia x1. Negative for dysuria or gross hematuria.  Hypertension: Currently she is on amlodipine 5 mg daily, HCTZ 25 mg daily, and metoprolol tartrate 25 mg 1/2 tablet twice daily.  Lab Results  Component Value Date   CREATININE 0.84 04/11/2020   BUN 10 04/11/2020   NA 140 04/11/2020   K 4.3 05/05/2020   CL 101 04/11/2020   CO2 27 04/11/2020   Vertigo: This is a chronic problem, stable.  Requesting refills on meclizine. Negative for new associated symptoms.  Review of Systems  Constitutional: Negative for appetite change, fatigue and fever.  HENT: Negative for hearing loss, mouth sores and sore throat.   Eyes: Negative for redness and visual disturbance.  Respiratory: Negative for cough, shortness of breath and wheezing.   Cardiovascular: Negative for chest pain and leg swelling.  Gastrointestinal: Negative for abdominal pain, nausea and vomiting.       No changes in bowel habits.  Endocrine: Negative for cold intolerance, heat intolerance, polydipsia, polyphagia and polyuria.  Genitourinary: Negative for decreased urine volume, vaginal bleeding and vaginal discharge.  Musculoskeletal: Positive for back pain. Negative for gait problem and neck pain.  Skin: Negative for color change and rash.  Allergic/Immunologic: Positive for environmental allergies.  Neurological: Negative for syncope,  weakness and headaches.  Hematological: Negative for adenopathy. Does not bruise/bleed easily.  Psychiatric/Behavioral: Negative for behavioral problems and confusion.  All other systems reviewed and are negative.  Current Outpatient Medications on File Prior to  Visit  Medication Sig Dispense Refill  . albuterol (VENTOLIN HFA) 108 (90 Base) MCG/ACT inhaler Inhale 1-2 puffs into the lungs every 6 (six) hours as needed for wheezing or shortness of breath. 6.7 g 0  . atorvastatin (LIPITOR) 20 MG tablet Take 1 tablet (20 mg total) by mouth daily. 90 tablet 1  . dexlansoprazole (DEXILANT) 60 MG capsule Take 1 capsule (60 mg total) by mouth daily. 30 capsule 1  . fluticasone (FLONASE) 50 MCG/ACT nasal spray Place 1 spray into both nostrils 2 (two) times daily as needed for allergies or rhinitis. 16 g 5  . ibuprofen (ADVIL) 600 MG tablet Take 1 tablet (600 mg total) by mouth every 8 (eight) hours as needed for moderate pain. 60 tablet 1  . metoprolol tartrate (LOPRESSOR) 25 MG tablet TAKE 0.5 TABLETS (12.5 MG TOTAL) BY MOUTH 2 (TWO) TIMES DAILY. 90 tablet 1  . pantoprazole (PROTONIX) 40 MG tablet Take 1 tablet by mouth 2 (two) times daily.    . sucralfate (CARAFATE) 1 g tablet Take 1 tablet (1 g total) by mouth every 6 (six) hours as needed. Slowly dissolve 1 tablet in 1 Tablespoon of distilled water before ingesting 60 tablet 3  . triamcinolone cream (KENALOG) 0.1 % Apply 1 application topically 2 (two) times daily as needed. 45 g 1  . valACYclovir (VALTREX) 500 MG tablet 1 TAB TWICE DAILY FOR 3 DAYS WITH OUTBREAKS, START WITHIN 48 HOURS AFTER ONSET. 18 tablet 1   No current facility-administered medications on file prior to visit.   Past Medical History:  Diagnosis Date  . COVID-19   . GERD (gastroesophageal reflux disease)   . Headache(784.0)   . Hypertension   . OSA (obstructive sleep apnea) 08/28/2017  . Pneumonia    Past Surgical History:  Procedure Laterality Date  . LEFT HEART CATH AND CORONARY ANGIOGRAPHY N/A 07/11/2017   Procedure: LEFT HEART CATH AND CORONARY ANGIOGRAPHY;  Surgeon: Martinique, Peter M, MD;  Location: [facility];  Service: Cardiovascular;  Laterality: N/A;  . LEFT HEART CATHETERIZATION WITH CORONARY ANGIOGRAM N/A 05/11/2013    Procedure: LEFT HEART CATHETERIZATION WITH CORONARY ANGIOGRAM;  Surgeon: Burnell Blanks, MD;  Location[facility];  Service: Cardiovascular;  Laterality: N/A;  . neck fusion      Allergies  Allergen Reactions  . Other Anaphylaxis, Shortness Of Breath and Swelling    [place] NUTS    Family History  Problem Relation Age of Onset  . Lung cancer Mother   . Stomach cancer Father   . Heart disease Brother        Pacemaker  . Esophageal cancer Brother   . Breast cancer Sister   . Colon cancer Neg Hx   . Pancreatic cancer Neg Hx   . Liver disease Neg Hx   . Rectal cancer Neg Hx     Social History   Socioeconomic History  . Marital status: Divorced    Spouse name: Not on file  . Number of children: 1  . Years of education: College  . Highest education level: Not on file  Occupational History    Comment: [employer]  Tobacco Use  . Smoking status: Former Smoker    Packs/day: 0.25    Years: 16.00    Pack years: 4.00  Types: Cigarettes    Quit date: 03/09/2018    Years since quitting: 2.4  . Smokeless tobacco: Never Used  Vaping Use  . Vaping Use: Never used  Substance and Sexual Activity  . Alcohol use: Yes    Comment: socially  . Drug use: No  . Sexual activity: Not Currently    Birth control/protection: Post-menopausal  Other Topics Concern  . Not on file  Social History Narrative   Lives at home alone   Right-handed   Drinks decaf coffee   Social Determinants of Health   Financial Resource Strain: Not on file  Food Insecurity: Not on file  Transportation Needs: Not on file  Physical Activity: Not on file  Stress: Not on file  Social Connections: Not on file   Vitals:   08/02/20 0709  BP: 130/80  Pulse: 78  Resp: 16  SpO2: 97%   Body mass index is 37.44 kg/m.  Wt Readings from Last 3 Encounters:  08/02/20 253 lb 8 oz (115 kg)  07/13/20 260 lb 9.6 oz (118.2 kg)  06/16/20 251 lb (113.9 kg)   Physical Exam Vitals and nursing note  reviewed.  Constitutional:      General: She is not in acute distress.    Appearance: She is well-developed.  HENT:     Head: Normocephalic and atraumatic.     Right Ear: Hearing, tympanic membrane, ear canal and external ear normal.     Left Ear: Hearing, tympanic membrane, ear canal and external ear normal.     Mouth/Throat:     Mouth: Mucous membranes are moist.     Pharynx: Oropharynx is clear. Uvula midline.  Eyes:     Extraocular Movements: Extraocular movements intact.     Conjunctiva/sclera: Conjunctivae normal.     Pupils: Pupils are equal, round, and reactive to light.  Neck:     Thyroid: No thyromegaly.     Trachea: No tracheal deviation.  Cardiovascular:     Rate and Rhythm: Normal rate and regular rhythm.     Pulses:          Dorsalis pedis pulses are 2+ on the right side and 2+ on the left side.     Heart sounds: No murmur heard.     Comments: Trace pitting LE , bilateral. Varicose veins, telangiectasias , bilateral. Pulmonary:     Effort: Pulmonary effort is normal. No respiratory distress.     Breath sounds: Normal breath sounds.  Chest:  Breasts:     Right: No supraclavicular adenopathy.     Left: No supraclavicular adenopathy.    Abdominal:     Palpations: Abdomen is soft. There is no hepatomegaly or mass.     Tenderness: There is no abdominal tenderness.  Genitourinary:    Comments: Deferred to gyn. Musculoskeletal:     Lumbar back: Tenderness present. No bony tenderness. Negative right straight leg raise test and negative left straight leg raise test.       Back:     Right lower leg: Pitting Edema present.     Left lower leg: Pitting Edema present.     Comments: No signs of synovitis appreciated.  Lymphadenopathy:     Cervical: No cervical adenopathy.     Upper Body:     Right upper body: No supraclavicular adenopathy.     Left upper body: No supraclavicular adenopathy.  Skin:    General: Skin is warm.     Findings: No erythema or rash.   Neurological:  General: No focal deficit present.     Mental Status: She is alert and oriented to person, place, and time.     Cranial Nerves: No cranial nerve deficit.     Coordination: Coordination normal.     Gait: Gait normal.     Deep Tendon Reflexes:     Reflex Scores:      Bicep reflexes are 2+ on the right side and 2+ on the left side.      Patellar reflexes are 2+ on the right side and 2+ on the left side. Psychiatric:        Speech: Speech normal.     Comments: Well groomed, good eye contact.   ASSESSMENT AND PLAN:  Ms. LASHONTA PILLING was here today annual physical examination.  Orders Placed This Encounter  Procedures  . DG Lumbar Spine Complete  . Basic metabolic panel  . Hemoglobin A1c  . Lipid panel  . TSH  . Hepatitis C antibody screen  . Urinalysis, Routine w reflex microscopic   Lab Results  Component Value Date   TSH 1.84 08/02/2020   Lab Results  Component Value Date   CHOL 177 08/02/2020   HDL 43.90 08/02/2020   LDLCALC 111 (H) 08/02/2020   LDLDIRECT 145.2 07/23/2012   TRIG 109.0 08/02/2020   CHOLHDL 4 08/02/2020   Lab Results  Component Value Date   CREATININE 0.93 08/02/2020   BUN 18 08/02/2020   NA 137 08/02/2020   K 4.3 08/02/2020   CL 101 08/02/2020   CO2 27 08/02/2020   Lab Results  Component Value Date   HGBA1C 5.7 08/02/2020   Routine general medical examination at a health care facility We discussed the importance of regular physical activity and healthy diet for prevention of chronic illness and/or complications. Preventive guidelines reviewed. Vaccination: She prefers to hold on Shingrix for now. Continue female preventive care with her gynecologist. Ca++ and vit D supplementation recommended, we discussed general recommendations in regard to doses. Next CPE in a year.  The 10-year ASCVD risk score Mikey Bussing DC Brooke Bonito., et al., 2013) is: 8.2%   Values used to calculate the score:     Age: 39 years     Sex: Female     Is  Non-Hispanic African American: Yes     Diabetic: No     Tobacco smoker: No     Systolic Blood Pressure: 124 mmHg     Is BP treated: Yes     HDL Cholesterol: 43.9 mg/dL     Total Cholesterol: 177 mg/dL  Hyperlipidemia, unspecified hyperlipidemia type Continue atorvastatin 20 mg daily. Further recommendations will be given according to lipid panel results.  Essential hypertension BP has been adequately controlled. We discussed some side effects of medications. Continue HCTZ 25 mg daily, amlodipine 5 mg daily, and metoprolol titrate 12.5 mg twice daily. Continue low-salt diet. Monitor BP regularly.  -     hydrochlorothiazide (HYDRODIURIL) 25 MG tablet; Take 1 tablet (25 mg total) by mouth daily. -     amLODipine (NORVASC) 5 MG tablet; Take 1 tablet (5 mg total) by mouth daily. -     TSH  Encounter for HCV screening test for low risk patient -     Hepatitis C antibody screen  Benign paroxysmal positional vertigo, unspecified laterality Problem has been stable. Fall precautions discussed. Continue meclizine 25 mg daily as needed.  -     meclizine (ANTIVERT) 25 MG tablet; Take 1 tablet (25 mg total) by mouth daily as needed  for dizziness.  Bilateral lower extremity edema History and examination do not suggest a serious process. LE elevation above heart level and compression stockings will help. Appropriate skin care. HCTZ 25 mg daily to continue.  -     hydrochlorothiazide (HYDRODIURIL) 25 MG tablet; Take 1 tablet (25 mg total) by mouth daily.  Asymptomatic postmenopausal estrogen deficiency -     Cancel: DEXAScan; Future  Screening for endocrine, metabolic and immunity disorder -     Hemoglobin A1c -     Basic metabolic panel  Chronic left-sided low back pain without sciatica We discussed diagnosis, prognosis, and treatment options. Weight gain definitely can aggravate problem. Lumbar x-ray ordered today. For recommendation will be given according to imaging  results.  Urinary frequency We discussed possible etiologies. ?  Overactive bladder. Kegel and pelvic floor exercises recommended. Further recommendations according to UA results.  Return in 6 months (on 02/01/2021) for HTN,WT.  Kelita Wallis G. Martinique, MD  [facility]. [place] office.    Today you have you routine preventive visit. A few things to remember from today's visit:   Routine general medical examination at a health care facility  Hyperlipidemia, unspecified hyperlipidemia type - Plan: Lipid panel  Essential hypertension - Plan: TSH, hydrochlorothiazide (HYDRODIURIL) 25 MG tablet, amLODipine (NORVASC) 5 MG tablet  Encounter for HCV screening test for low risk patient - Plan: Hepatitis C antibody screen  Benign paroxysmal positional vertigo, unspecified laterality - Plan: meclizine (ANTIVERT) 25 MG tablet  Bilateral lower extremity edema - Plan: hydrochlorothiazide (HYDRODIURIL) 25 MG tablet  Asymptomatic postmenopausal estrogen deficiency - Plan: DEXAScan  Screening for endocrine, metabolic and immunity disorder - Plan: Basic metabolic panel, Hemoglobin A1c  Chronic left-sided low back pain without sciatica - Plan: DG Lumbar Spine Complete  Urinary frequency - Plan: Urinalysis, Routine w reflex microscopic  If you need refills please call your pharmacy. Do not use My Chart to request refills or for acute issues that need immediate attention.    Please be sure medication list is accurate. If a new problem present, please set up appointment sooner than planned today.  At least 150 minutes of moderate exercise per week, daily brisk walking for 15-30 min is a good exercise option. Healthy diet low in saturated (animal) fats and sweets and consisting of fresh fruits and vegetables, lean meats such as fish and white chicken and whole grains.  These are some of recommendations for screening depending of age and risk factors:  - Vaccines:  Tdap vaccine  every 10 years.  Shingles vaccine recommended at age 43, could be given after 64 years of age but not sure about insurance coverage.   Pneumonia vaccines: Pneumovax at 46. Sometimes Pneumovax is giving earlier if history of smoking, lung disease,diabetes,kidney disease among some.  Screening for diabetes at age 14 and every 3 years.  Cervical cancer prevention:  Pap smear starts at 64 years of age and continues periodically until 64 years old in low risk women. Pap smear every 3 years between 41 and 16 years old. Pap smear every 3-5 years between women 18 and older if pap smear negative and HPV screening negative.   -Breast cancer: Mammogram: There is disagreement between experts about when to start screening in low risk asymptomatic female but recent recommendations are to start screening at 77 and not later than 64 years old , every 1-2 years and after 64 yo q 2 years. Screening is recommended until 64 years old but some women can continue screening depending of  healthy issues.  Colon cancer screening: Has been recently changed to 64 yo. Insurance may not cover until you are 64 years old. Screening is recommended until 64 years old.  Cholesterol disorder screening at age 39 and every 3 years.N/A  Also recommended:  1. Dental visit- Brush and floss your teeth twice daily; visit your dentist twice a year. 2. Eye doctor- Get an eye exam at least every 2 years. 3. Helmet use- Always wear a helmet when riding a bicycle, motorcycle, rollerblading or skateboarding. 4. Safe sex- If you may be exposed to sexually transmitted infections, use a condom. 5. Seat belts- Seat belts can save your live; always wear one. 6. Smoke/Carbon Monoxide detectors- These detectors need to be installed on the appropriate level of your home. Replace batteries at least once a year. 7. Skin cancer- When out in the sun please cover up and use sunscreen 15 SPF or higher. 8. Violence- If anyone is threatening or  hurting you, please tell your healthcare provider.  9. Drink alcohol in moderation- Limit alcohol intake to one drink or less per day. Never drink and drive. 10. Calcium supplementation 1000 to 1200 mg daily, ideally through your diet.  Vitamin D supplementation 800 units daily.  This exercise helps with mild urine leakage associated with cough, laughing, or sneezing. It may help with other types of urine incontinence.   Tighten and relax the pelvic muscles intermittently during the day. Once you are familiar with exercise try to hold pelvic muscles contraction for about 8-10 seconds.in the beginning you may not be able to hold contraction for more than a second or 2 but eventually you will be able to hold contraction harder and for longer time. Perform  8-12 exercises 3 times per day and daily for 15-20 weeks. You will need to continue exercises indefinitely to have a lasting effect.

## 2020-08-03 ENCOUNTER — Other Ambulatory Visit: Payer: Self-pay | Admitting: Family Medicine

## 2020-08-03 DIAGNOSIS — Z780 Asymptomatic menopausal state: Secondary | ICD-10-CM

## 2020-08-03 DIAGNOSIS — Z1231 Encounter for screening mammogram for malignant neoplasm of breast: Secondary | ICD-10-CM

## 2020-08-03 LAB — HEPATITIS C ANTIBODY
Hepatitis C Ab: NONREACTIVE
SIGNAL TO CUT-OFF: 0.01 (ref <1.00)

## 2020-08-05 MED ORDER — ATORVASTATIN CALCIUM 20 MG PO TABS: 20.0000 mg | ORAL_TABLET | Freq: Every day | ORAL | 3 refills | Status: DC

## 2020-08-21 ENCOUNTER — Other Ambulatory Visit: Payer: Self-pay | Admitting: Family Medicine

## 2020-08-21 DIAGNOSIS — I10 Essential (primary) hypertension: Secondary | ICD-10-CM

## 2020-09-01 ENCOUNTER — Telehealth (INDEPENDENT_AMBULATORY_CARE_PROVIDER_SITE_OTHER): Payer: 59 | Admitting: Family Medicine

## 2020-09-01 ENCOUNTER — Encounter: Payer: Self-pay | Admitting: Family Medicine

## 2020-09-01 VITALS — Temp 98.0°F

## 2020-09-01 DIAGNOSIS — R059 Cough, unspecified: Secondary | ICD-10-CM | POA: Diagnosis not present

## 2020-09-01 MED ORDER — BENZONATATE 100 MG PO CAPS: 100.0000 mg | ORAL_CAPSULE | Freq: Three times a day (TID) | ORAL | 0 refills | Status: DC | PRN

## 2020-09-01 NOTE — Progress Notes (Signed)
Virtual Visit via Video Note  I connected with Brianna Foster  on 09/01/20 at  6:00 PM EDT by a video enabled telemedicine application and verified that I am speaking with the correct person using two identifiers.  Location patient: home, [place] Location provider:work or home office Persons participating in the virtual visit: patient, provider  I discussed the limitations of evaluation and management by telemedicine and the availability of in person appointments. The patient expressed understanding and agreed to proceed.   HPI:  Acute telemedicine visit for cough and congestion: -Onset: 2 days ago -did a covid test Tuesday which was negative -Symptoms include: nasal congestion, cough, ears feel uncomfortable -Denies: fevers, body aches, CP, SOB, NVD, inability to eat/drink/get out of bed -known sick contacts -they were at a birthday party recently -Has tried:cough drops -Pertinent past medical history: HTN, angina, OSA, covid, GERD -Pertinent medication allergies: Allergies  Allergen Reactions  . Other Anaphylaxis, Shortness Of Breath and Swelling    [place] NUTS   -COVID-19 vaccine status: 2 covid vaccines and had covid  ROS: See pertinent positives and negatives per HPI.  Past Medical History:  Diagnosis Date  . COVID-19   . GERD (gastroesophageal reflux disease)   . Headache(784.0)   . Hypertension   . OSA (obstructive sleep apnea) 08/28/2017  . Pneumonia     Past Surgical History:  Procedure Laterality Date  . LEFT HEART CATH AND CORONARY ANGIOGRAPHY N/A 07/11/2017   Procedure: LEFT HEART CATH AND CORONARY ANGIOGRAPHY;  Surgeon: Martinique, Peter M, MD;  Location: [facility];  Service: Cardiovascular;  Laterality: N/A;  . LEFT HEART CATHETERIZATION WITH CORONARY ANGIOGRAM N/A 05/11/2013   Procedure: LEFT HEART CATHETERIZATION WITH CORONARY ANGIOGRAM;  Surgeon: Burnell Blanks, MD;  Location: [facility] C[facility];  Service: Cardiovascular;  Laterality: N/A;  . neck fusion        Current Outpatient Medications:  .  albuterol (VENTOLIN HFA) 108 (90 Base) MCG/ACT inhaler, Inhale 1-2 puffs into the lungs every 6 (six) hours as needed for wheezing or shortness of breath., Disp: 6.7 g, Rfl: 0 .  amLODipine (NORVASC) 5 MG tablet, Take 1 tablet (5 mg total) by mouth daily., Disp: 90 tablet, Rfl: 2 .  atorvastatin (LIPITOR) 20 MG tablet, Take 1 tablet (20 mg total) by mouth daily., Disp: 90 tablet, Rfl: 3 .  benzonatate (TESSALON PERLES) 100 MG capsule, Take 1 capsule (100 mg total) by mouth 3 (three) times daily as needed., Disp: 20 capsule, Rfl: 0 .  dexlansoprazole (DEXILANT) 60 MG capsule, Take 1 capsule (60 mg total) by mouth daily. (Patient taking differently: Take 60 mg by mouth daily. As needed), Disp: 30 capsule, Rfl: 1 .  fluticasone (FLONASE) 50 MCG/ACT nasal spray, Place 1 spray into both nostrils 2 (two) times daily as needed for allergies or rhinitis., Disp: 16 g, Rfl: 5 .  hydrochlorothiazide (HYDRODIURIL) 25 MG tablet, Take 1 tablet (25 mg total) by mouth daily., Disp: 90 tablet, Rfl: 2 .  ibuprofen (ADVIL) 600 MG tablet, Take 1 tablet (600 mg total) by mouth every 8 (eight) hours as needed for moderate pain., Disp: 60 tablet, Rfl: 1 .  meclizine (ANTIVERT) 25 MG tablet, Take 1 tablet (25 mg total) by mouth daily as needed for dizziness., Disp: 30 tablet, Rfl: 2 .  metoprolol tartrate (LOPRESSOR) 25 MG tablet, TAKE 0.5 TABLETS (12.5 MG TOTAL) BY MOUTH 2 (TWO) TIMES DAILY., Disp: 90 tablet, Rfl: 1 .  pantoprazole (PROTONIX) 40 MG tablet, Take 1 tablet by mouth 2 (two)  times daily., Disp: , Rfl:  .  sucralfate (CARAFATE) 1 g tablet, Take 1 tablet (1 g total) by mouth every 6 (six) hours as needed. Slowly dissolve 1 tablet in 1 Tablespoon of distilled water before ingesting, Disp: 60 tablet, Rfl: 3 .  triamcinolone cream (KENALOG) 0.1 %, Apply 1 application topically 2 (two) times daily as needed., Disp: 45 g, Rfl: 1 .  valACYclovir (VALTREX) 500 MG tablet, 1 TAB  TWICE DAILY FOR 3 DAYS WITH OUTBREAKS, START WITHIN 48 HOURS AFTER ONSET., Disp: 18 tablet, Rfl: 1  EXAM:  VITALS per patient if applicable:  GENERAL: alert, oriented, appears well and in no acute distress  HEENT: atraumatic, conjunttiva clear, no obvious abnormalities on inspection of external nose and ears  NECK: normal movements of the head and neck  LUNGS: on inspection no signs of respiratory distress, breathing rate appears normal, no obvious gross SOB, gasping or wheezing  CV: no obvious cyanosis  MS: moves all visible extremities without noticeable abnormality  PSYCH/NEURO: pleasant and cooperative, no obvious depression or anxiety, speech and thought processing grossly intact  ASSESSMENT AND PLAN:  Discussed the following assessment and plan:  Cough  -we discussed possible serious and likely etiologies, options for evaluation and workup, limitations of telemedicine visit vs in person visit, treatment, treatment risks and precautions. Pt prefers to treat via telemedicine empirically rather than in person at this moment. Query [facility], COVID19 w/ false negative early test vs other. Opted for tessalon for cough, repeat covid testing, other symptomatic care measures per patient instructions. Advise follow up video visit if positive covid test to discuss treatment and advised of treatment window.  Work/School slipped offered: provided in patient instructions Scheduled follow up with PCP offered: follow up as needed Advised to seek prompt in person care if worsening, new symptoms arise, or if is not improving with treatment. Discussed options for inperson care if PCP office not available. Did let this patient know that I only do telemedicine on Tuesdays and Thursdays for [facility]. Advised to schedule follow up visit with PCP or [facility] if any further questions or concerns to avoid delays in care.   I discussed the assessment and treatment plan with the patient. The patient was provided an  opportunity to ask questions and all were answered. The patient agreed with the plan and demonstrated an understanding of the instructions.     Lucretia Kern, DO

## 2020-09-01 NOTE — Patient Instructions (Addendum)
   ---------------------------------------------------------------------------------------------------------------------------      WORK SLIP:  Patient NIHAL DOAN,  1956/04/06, was seen for a medical visit today, 09/01/20 . Please excuse from work for a COVID like illness. We advise 10 days minimum from the onset of symptoms (08/30/20) PLUS 1 day of no fever and improved symptoms. Will defer to employer for a sooner return to work if the covid test is negative and symptoms are improving OR if symptoms have resolved, it is greater than 5 days since the positive test and the patient can wear a high-quality, tight fitting mask such as N95 or KN95 at all times for an additional 5 days. Would also suggest COVID19 antigen testing is negative prior to return.  Sincerely: E-signature: Dr. Colin Benton, DO [place] Ph: [phone]   ------------------------------------------------------------------------------------------------------------------------------     HOME CARE TIPS:  -[facility] testing information: https://www.[facility].org/ OR [phone] Most pharmacies also offer testing and home test kits. If the Covid19 test is positive, please make a prompt follow up visit with your primary care office or with [facility] to discuss treatment options. Treatments for Covid19 are best given early in the course of the illness.   -I sent the medication(s) we discussed to your pharmacy: Meds ordered this encounter  Medications  . benzonatate (TESSALON PERLES) 100 MG capsule    Sig: Take 1 capsule (100 mg total) by mouth 3 (three) times daily as needed.    Dispense:  20 capsule    Refill:  0     -can use nasal saline a few times per day if you have nasal congestion; sometimes  a short course of Afrin nasal spray for 3 days can help with symptoms as well  -stay hydrated, drink plenty of fluids and eat small healthy meals -  avoid dairy  -can take 1000 IU (20mcg) Vit D3 and 100-500 mg of Vit C daily per instructions  -If the Covid test is positive, check out the [facility] website for more information on home care, transmission and treatment for COVID19  -follow up with your doctor in 2-3 days unless improving and feeling better  -stay home while sick, except to seek medical care. If you have COVID19, ideally it would be best to stay home for a full 10 days since the onset of symptoms PLUS one day of no fever and feeling better. Wear a good mask that fits snugly (such as N95 or KN95) if around others to reduce the risk of transmission.  It was nice to meet you today, and I really hope you are feeling better soon. I help [facility] out with telemedicine visits on Tuesdays and Thursdays and am available for visits on those days. If you have any concerns or questions following this visit please schedule a follow up visit with your Primary Care doctor or seek care at a local urgent care clinic to avoid delays in care.    Seek in person care or schedule a follow up video visit promptly if your symptoms worsen, new concerns arise or you are not improving with treatment. Call 911 and/or seek emergency care if your symptoms are severe or life threatening.

## 2020-09-02 ENCOUNTER — Telehealth: Payer: Self-pay | Admitting: Family Medicine

## 2020-09-02 ENCOUNTER — Telehealth (INDEPENDENT_AMBULATORY_CARE_PROVIDER_SITE_OTHER): Payer: 59 | Admitting: Family Medicine

## 2020-09-02 ENCOUNTER — Encounter: Payer: Self-pay | Admitting: Family Medicine

## 2020-09-02 VITALS — Ht 69.0 in

## 2020-09-02 DIAGNOSIS — R059 Cough, unspecified: Secondary | ICD-10-CM

## 2020-09-02 DIAGNOSIS — J989 Respiratory disorder, unspecified: Secondary | ICD-10-CM

## 2020-09-02 DIAGNOSIS — R0981 Nasal congestion: Secondary | ICD-10-CM

## 2020-09-02 MED ORDER — PREDNISONE 20 MG PO TABS
40.0000 mg | ORAL_TABLET | Freq: Every day | ORAL | 0 refills | Status: AC
Start: 2020-09-02 — End: 2020-09-07

## 2020-09-02 MED ORDER — AEROCHAMBER PLUS MISC: 1 refills | Status: AC

## 2020-09-02 MED ORDER — DOXYCYCLINE HYCLATE 100 MG PO TABS: 100.0000 mg | ORAL_TABLET | Freq: Two times a day (BID) | ORAL | 0 refills | Status: AC

## 2020-09-02 NOTE — Progress Notes (Signed)
Virtual Visit via Video Note I connected with Brianna Foster on 09/02/20 by a video enabled telemedicine application and verified that I am speaking with the correct person using two identifiers.  Location patient: home Location provider:work office Persons participating in the virtual visit: patient, provider  I discussed the limitations of evaluation and management by telemedicine and the availability of in person appointments. The patient expressed understanding and agreed to proceed.  Chief Complaint  Patient presents with  . Ear Pain    Ears feel uncomfortable, congestion.    HPI: Brianna Foster is a 65 yo female with hx of GERD,seasonal allergies,HTN,and OSA c/o 3-4 days of fatigue,nasal congestion,sore throat,postnasal drainage,and productive cough. Negative for hemoptysis. Ear fullness/pressure sensation, no changes in hearing or ear drainage. Fatigue, "little" SOB and wheezing when going up stairs. No hx of asthma. Former smoker. She has Albuterol inh, is is not helping with cough..  Negative for fever,chills,changes in appetite, anosmia,ageusia,abdominal pain,N/V,body aches,or skin rash. Sinus pressure. Symptoms are stable.  Taking Mucinex, which has helped; and Tylenol.  No sick contact or recent travel.  She had a virtual visit yesterday. Rx for Benzonatate was sent, she has not pick it up. Cough is not interfering with sleep.  ROS: See pertinent positives and negatives per HPI.  Past Medical History:  Diagnosis Date  . COVID-19   . GERD (gastroesophageal reflux disease)   . Headache(784.0)   . Hypertension   . OSA (obstructive sleep apnea) 08/28/2017  . Pneumonia     Past Surgical History:  Procedure Laterality Date  . LEFT HEART CATH AND CORONARY ANGIOGRAPHY N/A 07/11/2017   Procedure: LEFT HEART CATH AND CORONARY ANGIOGRAPHY;  Surgeon: Martinique, Peter M, MD;  Location: [facility];  Service: Cardiovascular;  Laterality: N/A;  . LEFT HEART CATHETERIZATION  WITH CORONARY ANGIOGRAM N/A 05/11/2013   Procedure: LEFT HEART CATHETERIZATION WITH CORONARY ANGIOGRAM;  Surgeon: Burnell Blanks, MD;  Location: [facility];  Service: Cardiovascular;  Laterality: N/A;  . neck fusion      Family History  Problem Relation Age of Onset  . Lung cancer Mother   . Stomach cancer Father   . Heart disease Brother        Pacemaker  . Esophageal cancer Brother   . Breast cancer Sister   . Colon cancer Neg Hx   . Pancreatic cancer Neg Hx   . Liver disease Neg Hx   . Rectal cancer Neg Hx     Social History   Socioeconomic History  . Marital status: Divorced    Spouse name: Not on file  . Number of children: 1  . Years of education: College  . Highest education level: Not on file  Occupational History    Comment: [employer]  Tobacco Use  . Smoking status: Former Smoker    Packs/day: 0.25    Years: 16.00    Pack years: 4.00    Types: Cigarettes    Quit date: 03/09/2018    Years since quitting: 2.4  . Smokeless tobacco: Never Used  Vaping Use  . Vaping Use: Never used  Substance and Sexual Activity  . Alcohol use: Yes    Comment: socially  . Drug use: No  . Sexual activity: Not Currently    Birth control/protection: Post-menopausal  Other Topics Concern  . Not on file  Social History Narrative   Lives at home alone   Right-handed   Drinks decaf coffee   Social Determinants of Radio broadcast assistant  Strain: Not on file  Food Insecurity: Not on file  Transportation Needs: Not on file  Physical Activity: Not on file  Stress: Not on file  Social Connections: Not on file  Intimate Partner Violence: Not on file   Current Outpatient Medications:  .  albuterol (VENTOLIN HFA) 108 (90 Base) MCG/ACT inhaler, Inhale 1-2 puffs into the lungs every 6 (six) hours as needed for wheezing or shortness of breath., Disp: 6.7 g, Rfl: 0 .  amLODipine (NORVASC) 5 MG tablet, Take 1 tablet (5 mg total) by mouth daily., Disp: 90 tablet, Rfl:  2 .  atorvastatin (LIPITOR) 20 MG tablet, Take 1 tablet (20 mg total) by mouth daily., Disp: 90 tablet, Rfl: 3 .  benzonatate (TESSALON PERLES) 100 MG capsule, Take 1 capsule (100 mg total) by mouth 3 (three) times daily as needed., Disp: 20 capsule, Rfl: 0 .  dexlansoprazole (DEXILANT) 60 MG capsule, Take 1 capsule (60 mg total) by mouth daily. (Patient taking differently: Take 60 mg by mouth daily. As needed), Disp: 30 capsule, Rfl: 1 .  fluticasone (FLONASE) 50 MCG/ACT nasal spray, Place 1 spray into both nostrils 2 (two) times daily as needed for allergies or rhinitis., Disp: 16 g, Rfl: 5 .  hydrochlorothiazide (HYDRODIURIL) 25 MG tablet, Take 1 tablet (25 mg total) by mouth daily., Disp: 90 tablet, Rfl: 2 .  ibuprofen (ADVIL) 600 MG tablet, Take 1 tablet (600 mg total) by mouth every 8 (eight) hours as needed for moderate pain., Disp: 60 tablet, Rfl: 1 .  meclizine (ANTIVERT) 25 MG tablet, Take 1 tablet (25 mg total) by mouth daily as needed for dizziness., Disp: 30 tablet, Rfl: 2 .  metoprolol tartrate (LOPRESSOR) 25 MG tablet, TAKE 0.5 TABLETS (12.5 MG TOTAL) BY MOUTH 2 (TWO) TIMES DAILY., Disp: 90 tablet, Rfl: 1 .  pantoprazole (PROTONIX) 40 MG tablet, Take 1 tablet by mouth 2 (two) times daily., Disp: , Rfl:  .  sucralfate (CARAFATE) 1 g tablet, Take 1 tablet (1 g total) by mouth every 6 (six) hours as needed. Slowly dissolve 1 tablet in 1 Tablespoon of distilled water before ingesting, Disp: 60 tablet, Rfl: 3 .  triamcinolone cream (KENALOG) 0.1 %, Apply 1 application topically 2 (two) times daily as needed., Disp: 45 g, Rfl: 1 .  valACYclovir (VALTREX) 500 MG tablet, 1 TAB TWICE DAILY FOR 3 DAYS WITH OUTBREAKS, START WITHIN 48 HOURS AFTER ONSET., Disp: 18 tablet, Rfl: 1  EXAM:  VITALS per patient if applicable:Ht 5\' 9"  (1.753 m)   LMP 01/07/2010 (LMP Unknown)   BMI 37.44 kg/m   GENERAL: alert, oriented, appears well and in no acute distress  HEENT: atraumatic, conjunctiva clear, no  obvious abnormalities on inspection of external nose and ears Nasal voice.  NECK: normal movements of the head and neck  LUNGS: on inspection no signs of respiratory distress, breathing rate appears normal, no obvious gross SOB, gasping or wheezing No cough during visit.  CV: no obvious cyanosis  MS: moves all visible extremities without noticeable abnormality  PSYCH/NEURO: pleasant and cooperative, no obvious depression or anxiety, speech and thought processing grossly intact  ASSESSMENT AND PLAN:  Discussed the following assessment and plan:  Reactive airway disease without asthma - Plan: predniSONE (DELTASONE) 20 MG tablet, Spacer/Aero-Holding Chambers (AEROCHAMBER PLUS) inhaler Hx and observation through video do not suggest a serious infectious process. Albuterol inh 2 puff every 6 hours for a week then as needed for wheezing or shortness of breath. Recommend  Using inhaler with spacer. After  discussing some side effects of Prednisone, she agrees with short course, 3-5 days. Prednisone with breakfast. I do not think abx is needed at this time. Instructed about warning signs.  Nasal sinus congestion Possible etiologies discussed. I do not thinks abx is needed at this time. Nasal saline irrigations as needed. Continue Flonase nasal spray daily. Prednisone may help. Monitor for fever.  If sinus pressure gets worse of not any better in 3-4 days, she can start Doxycycline. Some side effects discussed.  Cough  Explained cough and congestion can last a few more days and even weeks after viral illness. Other possible etiologies discussed.  She prefers to hold on CXR for now. She has a prescription for Benzonatate at her pharmacy. Continue plain Mucinex.  We discussed possible serious and likely etiologies, options for evaluation and workup, limitations of telemedicine visit vs in person visit, treatment, treatment risks and precautions.  I discussed the assessment and  treatment plan with the patient. Ms. Glendon Axe provided an opportunity to ask questions and all were answered. The patient agreed with the plan and demonstrated an understanding of the instructions. Note for work will be provided.  Return if symptoms worsen or fail to improve.  Emauri Krygier Martinique, MD

## 2020-09-03 ENCOUNTER — Encounter: Payer: Self-pay | Admitting: Family Medicine

## 2020-09-12 ENCOUNTER — Ambulatory Visit: Payer: 59 | Admitting: Family Medicine

## 2020-09-22 ENCOUNTER — Inpatient Hospital Stay: Admission: RE | Admit: 2020-09-22 | Payer: 59 | Source: Ambulatory Visit

## 2020-09-24 ENCOUNTER — Inpatient Hospital Stay: Admission: RE | Admit: 2020-09-24 | Payer: Self-pay | Source: Ambulatory Visit

## 2020-09-28 ENCOUNTER — Other Ambulatory Visit: Payer: Self-pay

## 2020-09-28 ENCOUNTER — Telehealth (INDEPENDENT_AMBULATORY_CARE_PROVIDER_SITE_OTHER): Payer: Self-pay | Admitting: Family Medicine

## 2020-09-28 DIAGNOSIS — J029 Acute pharyngitis, unspecified: Secondary | ICD-10-CM

## 2020-09-28 NOTE — Progress Notes (Signed)
Patient ID: Brianna Foster, female   DOB: 1955-10-01, 65 y.o.   MRN: [id number]  This visit type was conducted due to national recommendations for restrictions regarding the COVID-19 pandemic in an effort to limit this patient's exposure and mitigate transmission in our community.   Virtual Visit via Telephone Note  I connected with Brianna Foster on 09/28/20 at  5:00 PM EDT by telephone and verified that I am speaking with the correct person using two identifiers.   I discussed the limitations, risks, security and privacy concerns of performing an evaluation and management service by telephone and the availability of in person appointments. I also discussed with the patient that there may be a patient responsible charge related to this service. The patient expressed understanding and agreed to proceed.  Location patient: home Location provider: work or home office Participants present for the call: patient, provider Patient did not have a visit in the prior 7 days to address this/these issue(s).   History of Present Illness:  Brianna Foster relates onset Sunday of sore throat symptoms.  She is actually currently traveling with work and is currently in [place].  She did COVID test which came back negative.  Her sore throat has persisted.  She has had some nasal congestion.  No facial pain.  Occasional headaches.  Sore throat symptoms worse in the morning.  She had somewhat similar symptoms back in May and ended up taking doxycycline and symptoms improved.  Denies any purulent nasal secretions.  Past Medical History:  Diagnosis Date   COVID-19    GERD (gastroesophageal reflux disease)    Headache(784.0)    Hypertension    OSA (obstructive sleep apnea) 08/28/2017   Pneumonia    Past Surgical History:  Procedure Laterality Date   LEFT HEART CATH AND CORONARY ANGIOGRAPHY N/A 07/11/2017   Procedure: LEFT HEART CATH AND CORONARY ANGIOGRAPHY;  Surgeon: Martinique, Peter M, MD;  Location: [facility];  Service: Cardiovascular;  Laterality: N/A;   LEFT HEART CATHETERIZATION WITH CORONARY ANGIOGRAM N/A 05/11/2013   Procedure: LEFT HEART CATHETERIZATION WITH CORONARY ANGIOGRAM;  Surgeon: Burnell Blanks, MD;  Location: [facility] CATH LAB;  Service: Cardiovascular;  Laterality: N/A;   neck fusion      reports that she quit smoking about 2 years ago. Her smoking use included cigarettes. She has a 4.00 pack-year smoking history. She has never used smokeless tobacco. She reports current alcohol use. She reports that she does not use drugs. family history includes Breast cancer in her sister; Esophageal cancer in her brother; Heart disease in her brother; Lung cancer in her mother; Stomach cancer in her father. Allergies  Allergen Reactions   Other Anaphylaxis, Shortness Of Breath and Swelling    [place] NUTS      Observations/Objective: Patient sounds cheerful and well on the phone. I do not appreciate any SOB. Speech and thought processing are grossly intact. Patient reported vitals:  Assessment and Plan:  Sore throat.  Suspect probably viral.  Recent COVID test negative.  -Recommend symptomatic treatment with continued acetaminophen, plenty fluids, hot liquids, salt water gargles -If sore throat not resolving over the next few days be in touch.  We did discuss other possible etiologies such as acute sinusitis with postnasal drip but this sounds more viral  Follow Up Instructions:  As above.  She will be in touch by Friday if symptoms not improving   [id number] 5-10 [id number] 11-20 [id number] 21-30 I did not refer this patient for an OV in the next  24 hours for this/these issue(s).  I discussed the assessment and treatment plan with the patient. The patient was provided an opportunity to ask questions and all were answered. The patient agreed with the plan and demonstrated an understanding of the instructions.   The patient was advised to call back or seek an in-person evaluation if the  symptoms worsen or if the condition fails to improve as anticipated.  I provided 12 minutes of non-face-to-face time during this encounter.   Carolann Littler, MD

## 2020-10-03 ENCOUNTER — Other Ambulatory Visit: Payer: Self-pay | Admitting: Family Medicine

## 2020-10-03 MED ORDER — PANTOPRAZOLE SODIUM 40 MG PO TBEC: 40.0000 mg | DELAYED_RELEASE_TABLET | Freq: Two times a day (BID) | ORAL | 0 refills | Status: DC

## 2020-10-03 NOTE — Telephone Encounter (Signed)
I spoke with patient, she was out of medication and wanted to know if Dr. Martinique could okay a prescription for her. Dr. Martinique okayed the Rx. Pt aware that future refills will need to come from GI.

## 2020-10-03 NOTE — Addendum Note (Signed)
Addended by: Rodrigo Ran on: 10/03/2020 02:50 PM   Modules accepted: Orders

## 2020-10-03 NOTE — Telephone Encounter (Signed)
Pt is calling in stating that she has been out of pantoprazole (PROTONIX) 40 MG and is having a lot of heartburn for the past two weeks and the pharmacy stated that they had faxed something over to the office and was waiting on the provider to reply.  Pharm:  [facility] on [address]

## 2020-10-03 NOTE — Telephone Encounter (Signed)
She follows with GI.So please ask pharmacy to seen it to his office. Thanks, BJ

## 2020-10-04 ENCOUNTER — Other Ambulatory Visit: Payer: Self-pay

## 2020-10-04 MED ORDER — PANTOPRAZOLE SODIUM 40 MG PO TBEC: 40.0000 mg | DELAYED_RELEASE_TABLET | Freq: Two times a day (BID) | ORAL | 2 refills | Status: DC

## 2020-10-04 NOTE — Progress Notes (Signed)
See note from refill request.  Ok to fill per [place]

## 2020-10-26 DIAGNOSIS — G4733 Obstructive sleep apnea (adult) (pediatric): Secondary | ICD-10-CM | POA: Diagnosis not present

## 2020-11-11 NOTE — Telephone Encounter (Signed)
error 

## 2020-11-21 ENCOUNTER — Encounter: Payer: Self-pay | Admitting: Family Medicine

## 2020-11-21 ENCOUNTER — Other Ambulatory Visit: Payer: Self-pay

## 2020-11-21 ENCOUNTER — Ambulatory Visit (INDEPENDENT_AMBULATORY_CARE_PROVIDER_SITE_OTHER): Payer: BC Managed Care – PPO | Admitting: Family Medicine

## 2020-11-21 ENCOUNTER — Ambulatory Visit: Payer: Self-pay

## 2020-11-21 VITALS — BP 144/82 | HR 80 | Ht 69.0 in | Wt 254.0 lb

## 2020-11-21 DIAGNOSIS — M25521 Pain in right elbow: Secondary | ICD-10-CM

## 2020-11-21 DIAGNOSIS — M7521 Bicipital tendinitis, right shoulder: Secondary | ICD-10-CM | POA: Diagnosis not present

## 2020-11-21 MED ORDER — PREDNISONE 50 MG PO TABS: 50.0000 mg | ORAL_TABLET | Freq: Every day | ORAL | 0 refills | Status: DC

## 2020-11-21 NOTE — Patient Instructions (Addendum)
Thank you for coming in today.   Take the prednisone.   Please use Voltaren gel (Generic Diclofenac Gel) up to 4x daily for pain as needed.  This is available over-the-counter as both the name brand Voltaren gel and the generic diclofenac gel.   Do the home exercises.   Please perform the exercise program that we have prepared for you and gone over in detail on a daily basis.  In addition to the handout you were provided you can access your program through: [email or web address]   Your unique program code is:  [email or web address]  Ok to use the splint.   Let me know if you do not improve.

## 2020-11-21 NOTE — Progress Notes (Signed)
   I, Wendy Poet, LAT, ATC, am serving as scribe for Dr. Lynne Leader.  Brianna Foster is a 65 y.o. female who presents to [place] at [facility] today for R arm pain and weakness x 2 weeks . Pt was previously seen by Dr. Georgina Snell on 03/15/20 for a R tibial stress fx. Today, pt locates pain to her R ant elbow/antecubital fossa that radiates proximally into her R upper arm.  She has had prior anterior cervical fusion.  Radiates: yes into her L upper arm UE numbness/tingling: No UE weakness:yes in her R elbow and upper arm Aggravates: R sidelying position; R forearm pronation; R elbow extension Treatments tried:wrist brace; Tylenol; Biofreeze; heat   Pertinent review of systems: No fevers or chills  Relevant historical information: Hypertension   Exam:  BP (!) 144/82 (BP Location: Left Arm, Patient Position: Sitting, Cuff Size: Large)   Pulse 80   Ht '5\' 9"'$  (1.753 m)   Wt 254 lb (115.2 kg)   LMP 01/07/2010 (LMP Unknown)   SpO2 97%   BMI 37.51 kg/m  General: Well Developed, well nourished, and in no acute distress.   MSK: Right elbow normal-appearing Tender palpation distal biceps tendon antecubital fossa. Range of motion limited full extension by 5 degrees limited full pronation by 5 degrees. Pain with resisted flexion of the elbow and supination of the wrist. No pain with resisted wrist extension and flexion. No pain with resisted elbow extension.  Pulses cap refill and sensation are intact distally. Grip strength is intact.    Lab and Radiology Results  Diagnostic Limited MSK Ultrasound of: Elbow anterior Biceps tendon visualized normal-appearing with no visible tear. Impression: Normal-appearing biceps tendon      Assessment and Plan: 65 y.o. female with right elbow pain.  Pain is located anterior elbow and consistent with distal biceps tendinitis based on pain pattern and location of pain and tenderness along the distal biceps tendon.   Ultrasound examination today does not show a tear or severe tenosynovitis which is typical for this problem.  Plan for home exercise program taught in clinic today by ATC.  In addition prescribe short course of prednisone.  Recheck in 1 month or if not improved if needed.   PDMP not reviewed this encounter. Orders Placed This Encounter  Procedures   [place] LIMITED JOINT SPACE STRUCTURES UP RIGHT(NO LINKED CHARGES)    Order Specific Question:   Reason for Exam (SYMPTOM  OR DIAGNOSIS REQUIRED)    Answer:   R elbow pain    Order Specific Question:   Preferred imaging location?    Answer:   [facility]   Meds ordered this encounter  Medications   predniSONE (DELTASONE) 50 MG tablet    Sig: Take 1 tablet (50 mg total) by mouth daily.    Dispense:  5 tablet    Refill:  0     Discussed warning signs or symptoms. Please see discharge instructions. Patient expresses understanding.   The above documentation has been reviewed and is accurate and complete Lynne Leader, M.D.

## 2020-11-22 ENCOUNTER — Ambulatory Visit: Payer: Self-pay | Admitting: Family Medicine

## 2020-12-22 ENCOUNTER — Ambulatory Visit: Payer: Self-pay

## 2021-01-02 ENCOUNTER — Ambulatory Visit: Payer: BC Managed Care – PPO

## 2021-01-04 DIAGNOSIS — G4733 Obstructive sleep apnea (adult) (pediatric): Secondary | ICD-10-CM | POA: Diagnosis not present

## 2021-01-11 ENCOUNTER — Ambulatory Visit: Payer: BC Managed Care – PPO | Admitting: Family Medicine

## 2021-01-17 ENCOUNTER — Other Ambulatory Visit: Payer: 59

## 2021-01-18 ENCOUNTER — Ambulatory Visit: Payer: BC Managed Care – PPO | Admitting: Family Medicine

## 2021-01-19 NOTE — Progress Notes (Signed)
I, Peterson Lombard, LAT, ATC acting as a scribe for Lynne Leader, MD.  Brianna Foster is a 65 y.o. female who presents to [place] at [facility] today for f/u of R upper arm and ant elbow pain thought to be due to distal biceps tendinitis.  She was last seen by Dr. Georgina Snell on 11/21/20 and was shown a HEP focusing on biceps eccentrics.  She was also prescribed a short course of prednisone.  Today, pt reports R arm pain is worsening. Pt is now expiencing swelling into R hand/fingers. Pt location pain around the cubical fossa w/ burning and radiating pain into forearm. Pain is mostly bothersome with activity but also bothersome at bedtime.  The prednisone did help temporarily.   Pertinent review of systems: No fevers or chills  Relevant historical information: Hypertension.  History de Quervain's tenosynovitis right side Significant claustrophobia.  History of MRI requiring IV sedation.   Exam:  BP (!) 138/92   Pulse (!) 59   Ht 5\' 9"  (1.753 m)   Wt 253 lb 9.6 oz (115 kg)   LMP 01/07/2010 (LMP Unknown)   SpO2 97%   BMI 37.45 kg/m  General: Well Developed, well nourished, and in no acute distress.  HEENT: No enlarged tonsils. Heart: Regular rate and rhythm no murmurs rubs or gallops. Lungs: Clear to auscultation bilaterally normal work of breathing.  MSK: Right elbow: Normal-appearing Tender palpation antecubital fossa at distal biceps tendon. Pain with resisted elbow flexion and supination. No pain with resisted wrist flexion and grip.    Lab and Radiology Results  X-ray images right elbow obtained today personally and independently interpreted No acute fractures.  No significant degenerative changes. Await formal radiology review   Assessment and Plan: 65 y.o. female with right anterior elbow pain.  Etiology unclear.  Most likely explanation is distal biceps tendinitis.  It is also possible of the median nerve is being affected as it transitions across  the antecubital fossa by the biceps tendinitis as well producing some distal paresthesias.  She has not improved with conservative management including physician directed home exercise over the last 8 weeks.  She has been doing the exercises at least 3 times a week for 20 minutes a day including eccentric biceps strengthening exercises.  Plan to proceed to MRI.  However she does have significant MRI claustrophobia and has required IV sedation in the past.  We will proceed to IV sedation in the hospital with moderate sedation.  In the meantime Limited trial of gabapentin and compression elbow sleeve for comfort and continued exercise while awaiting MRI.   PDMP not reviewed this encounter. Orders Placed This Encounter  Procedures   MR ELBOW RIGHT WO CONTRAST    Standing Status:   Future    Standing Expiration Date:   01/20/2022    Order Specific Question:   What is the patient's sedation requirement?    Answer:   General Anesthesia (available ONLY at [facility])    Order Specific Question:   Does the patient have a pacemaker or implanted devices?    Answer:   No    Order Specific Question:   Preferred imaging location?    Answer:   [facility] (table limit - 500 lbs)   DG ELBOW COMPLETE RIGHT (3+VIEW)    Standing Status:   Future    Number of Occurrences:   1    Standing Expiration Date:   01/20/2022    Order Specific Question:   Reason  for Exam (SYMPTOM  OR DIAGNOSIS REQUIRED)    Answer:   right elbow pain    Order Specific Question:   Preferred imaging location?    Answer:   [facility]   Meds ordered this encounter  Medications   gabapentin (NEURONTIN) 300 MG capsule    Sig: Take 1 capsule (300 mg total) by mouth 3 (three) times daily as needed.    Dispense:  90 capsule    Refill:  3     Discussed warning signs or symptoms. Please see discharge instructions. Patient expresses understanding.   The above documentation has been reviewed and is accurate and  complete Lynne Leader, M.D.

## 2021-01-20 ENCOUNTER — Ambulatory Visit (INDEPENDENT_AMBULATORY_CARE_PROVIDER_SITE_OTHER): Payer: BC Managed Care – PPO | Admitting: Family Medicine

## 2021-01-20 ENCOUNTER — Other Ambulatory Visit: Payer: Self-pay

## 2021-01-20 ENCOUNTER — Ambulatory Visit (INDEPENDENT_AMBULATORY_CARE_PROVIDER_SITE_OTHER): Payer: BC Managed Care – PPO

## 2021-01-20 VITALS — BP 138/92 | HR 59 | Ht 69.0 in | Wt 253.6 lb

## 2021-01-20 DIAGNOSIS — M7521 Bicipital tendinitis, right shoulder: Secondary | ICD-10-CM | POA: Diagnosis not present

## 2021-01-20 DIAGNOSIS — M25521 Pain in right elbow: Secondary | ICD-10-CM

## 2021-01-20 MED ORDER — GABAPENTIN 300 MG PO CAPS: 300.0000 mg | ORAL_CAPSULE | Freq: Three times a day (TID) | ORAL | 3 refills | Status: DC | PRN

## 2021-01-20 NOTE — Patient Instructions (Addendum)
Thank you for coming in today.   Please get an Xray today before you leave   Plan for MRI with IV sedation (moderate sedation).   Recheck after the MRI.   I recommend you obtained a compression sleeve to help with your joint problems. There are many options on the market however I recommend obtaining a full elbow Body Helix compression sleeve.  You can find information (including how to appropriate measure yourself for sizing) can be found at www.Body http://www.lambert.com/.  Many of these products are health savings account (HSA) eligible.   You can use the compression sleeve at any time throughout the day but is most important to use while being active as well as for 2 hours post-activity.   It is appropriate to ice following activity with the compression sleeve in place.   Please let me know if there is a problem.

## 2021-01-24 NOTE — Progress Notes (Deleted)
HPI:  [name] is a 65 y.o. female, who is here today to follow on recent lab results.   Lipid Panel     Component Value Date/Time   CHOL 177 08/02/2020 0753   TRIG 109.0 08/02/2020 0753   HDL 43.90 08/02/2020 0753   CHOLHDL 4 08/02/2020 0753   VLDL 21.8 08/02/2020 0753   LDLCALC 111 (H) 08/02/2020 0753   LDLDIRECT 145.2 07/23/2012 0959    Review of Systems Rest see pertinent positives and negatives per HPI.  Current Outpatient Medications on File Prior to Visit  Medication Sig Dispense Refill   albuterol (VENTOLIN HFA) 108 (90 Base) MCG/ACT inhaler Inhale 1-2 puffs into the lungs every 6 (six) hours as needed for wheezing or shortness of breath. 6.7 g 0   amLODipine (NORVASC) 5 MG tablet Take 1 tablet (5 mg total) by mouth daily. 90 tablet 2   atorvastatin (LIPITOR) 20 MG tablet Take 1 tablet (20 mg total) by mouth daily. 90 tablet 3   benzonatate (TESSALON PERLES) 100 MG capsule Take 1 capsule (100 mg total) by mouth 3 (three) times daily as needed. 20 capsule 0   fluticasone (FLONASE) 50 MCG/ACT nasal spray Place 1 spray into both nostrils 2 (two) times daily as needed for allergies or rhinitis. 16 g 5   gabapentin (NEURONTIN) 300 MG capsule Take 1 capsule (300 mg total) by mouth 3 (three) times daily as needed. 90 capsule 3   hydrochlorothiazide (HYDRODIURIL) 25 MG tablet Take 1 tablet (25 mg total) by mouth daily. 90 tablet 2   ibuprofen (ADVIL) 600 MG tablet Take 1 tablet (600 mg total) by mouth every 8 (eight) hours as needed for moderate pain. 60 tablet 1   meclizine (ANTIVERT) 25 MG tablet Take 1 tablet (25 mg total) by mouth daily as needed for dizziness. 30 tablet 2   metoprolol tartrate (LOPRESSOR) 25 MG tablet TAKE 0.5 TABLETS (12.5 MG TOTAL) BY MOUTH 2 (TWO) TIMES DAILY. 90 tablet 1   pantoprazole (PROTONIX) 40 MG tablet Take 1 tablet (40 mg total) by mouth 2 (two) times daily. 60 tablet 2   Spacer/Aero-Holding Chambers (AEROCHAMBER PLUS) inhaler Use  as instructed to use with inahaler. 1 each 1   sucralfate (CARAFATE) 1 g tablet Take 1 tablet (1 g total) by mouth every 6 (six) hours as needed. Slowly dissolve 1 tablet in 1 Tablespoon of distilled water before ingesting 60 tablet 3   triamcinolone cream (KENALOG) 0.1 % Apply 1 application topically 2 (two) times daily as needed. 45 g 1   valACYclovir (VALTREX) 500 MG tablet 1 TAB TWICE DAILY FOR 3 DAYS WITH OUTBREAKS, START WITHIN 48 HOURS AFTER ONSET. 18 tablet 1   No current facility-administered medications on file prior to visit.    Past Medical History:  Diagnosis Date   COVID-19    GERD (gastroesophageal reflux disease)    Headache(784.0)    Hypertension    OSA (obstructive sleep apnea) 08/28/2017   Pneumonia    Allergies  Allergen Reactions   Other Anaphylaxis, Shortness Of Breath and Swelling    [place] NUTS    Social History   Socioeconomic History   Marital status: Divorced    Spouse name: Not on file   Number of children: 1   Years of education: College   Highest education level: Not on file  Occupational History    Comment: Glass blower/designer  Tobacco Use   Smoking status: Former    Packs/day: 0.25    Years:  16.00    Pack years: 4.00    Types: Cigarettes    Quit date: 03/09/2018    Years since quitting: 2.8   Smokeless tobacco: Never  Vaping Use   Vaping Use: Never used  Substance and Sexual Activity   Alcohol use: Yes    Comment: socially   Drug use: No   Sexual activity: Not Currently    Birth control/protection: Post-menopausal  Other Topics Concern   Not on file  Social History Narrative   Lives at home alone   Right-handed   Drinks decaf coffee   Social Determinants of Health   Financial Resource Strain: Not on file  Food Insecurity: Not on file  Transportation Needs: Not on file  Physical Activity: Not on file  Stress: Not on file  Social Connections: Not on file    There were no vitals filed for this visit. There is no height or  weight on file to calculate BMI.  Physical Exam  ASSESSMENT AND PLAN:   There are no diagnoses linked to this encounter.  No orders of the defined types were placed in this encounter.   No problem-specific Assessment & Plan notes found for this encounter.   No follow-ups on file.   Betty G. Martinique, MD  [facility]. [place] office.

## 2021-01-24 NOTE — Progress Notes (Signed)
Right elbow x-ray looks normal to radiology

## 2021-01-25 ENCOUNTER — Encounter: Payer: BC Managed Care – PPO | Admitting: Family Medicine

## 2021-01-27 NOTE — Progress Notes (Signed)
HPI: [name] is a 65 y.o. female, who is here today to follow on the blood work she had completed in April. She was last seen on 09/02/20. Since her last visit she has seen sport medicine.  HLD: She is on Atorvastatin 20 mg daily. Tolerating medication well.    Component Value Date/Time   CHOL 177 08/02/2020 0753   TRIG 109.0 08/02/2020 0753   HDL 43.90 08/02/2020 0753   CHOLHDL 4 08/02/2020 0753   VLDL 21.8 08/02/2020 0753   LDLCALC 111 (H) 08/02/2020 0753   LDLDIRECT 145.2 07/23/2012 0959   Hypertension:  Medications:Metoprolol tartrate 25 mg 1/2 tab bid,Amlodipine 5 mg daily, and HCTZ 25 mg daily. BP readings at home:She is not checking it. Side effects:None  Negative for unusual or severe headache, visual changes, exertional chest pain, dyspnea,  focal weakness, or edema.  Lab Results  Component Value Date   CREATININE 0.93 08/02/2020   BUN 18 08/02/2020   NA 137 08/02/2020   K 4.3 08/02/2020   CL 101 08/02/2020   CO2 27 08/02/2020   Feeling "funny in my head." Concerned about possible DM. Craving sweets. HgA1C 5.7 in 07/2020. Negative for abdominal pain, nausea,vomiting, polydipsia,polyuria, or polyphagia.  She has gained wt, frustrated because she has not lost much wt .She has decreased carb and she is walking more. She has 2 nieces just dx'ed with DM II. She would like to try Ozempic for wt loss.  "Trouble holding urine", small amount of leakage if she coughs.Problem has been going on for a while. No gross hematuria or dysuria. Nocturia x 0-1.  Review of Systems  Constitutional:  Negative for activity change, appetite change, chills and fever.  Respiratory:  Negative for cough and wheezing.   Musculoskeletal:  Negative for gait problem and myalgias.  Skin:  Negative for rash.  Neurological:  Negative for syncope and facial asymmetry.  Rest see pertinent positives and negatives per HPI.  Current Outpatient Medications on File Prior to Visit   Medication Sig Dispense Refill   albuterol (VENTOLIN HFA) 108 (90 Base) MCG/ACT inhaler Inhale 1-2 puffs into the lungs every 6 (six) hours as needed for wheezing or shortness of breath. 6.7 g 0   amLODipine (NORVASC) 5 MG tablet Take 1 tablet (5 mg total) by mouth daily. 90 tablet 2   atorvastatin (LIPITOR) 20 MG tablet Take 1 tablet (20 mg total) by mouth daily. 90 tablet 3   fluticasone (FLONASE) 50 MCG/ACT nasal spray Place 1 spray into both nostrils 2 (two) times daily as needed for allergies or rhinitis. 16 g 5   gabapentin (NEURONTIN) 300 MG capsule Take 1 capsule (300 mg total) by mouth 3 (three) times daily as needed. 90 capsule 3   hydrochlorothiazide (HYDRODIURIL) 25 MG tablet Take 1 tablet (25 mg total) by mouth daily. 90 tablet 2   ibuprofen (ADVIL) 600 MG tablet Take 1 tablet (600 mg total) by mouth every 8 (eight) hours as needed for moderate pain. 60 tablet 1   meclizine (ANTIVERT) 25 MG tablet Take 1 tablet (25 mg total) by mouth daily as needed for dizziness. 30 tablet 2   metoprolol tartrate (LOPRESSOR) 25 MG tablet TAKE 0.5 TABLETS (12.5 MG TOTAL) BY MOUTH 2 (TWO) TIMES DAILY. 90 tablet 1   pantoprazole (PROTONIX) 40 MG tablet Take 1 tablet (40 mg total) by mouth 2 (two) times daily. 60 tablet 2   Spacer/Aero-Holding Chambers (AEROCHAMBER PLUS) inhaler Use as instructed to use with inahaler. 1 each 1  sucralfate (CARAFATE) 1 g tablet Take 1 tablet (1 g total) by mouth every 6 (six) hours as needed. Slowly dissolve 1 tablet in 1 Tablespoon of distilled water before ingesting 60 tablet 3   triamcinolone cream (KENALOG) 0.1 % Apply 1 application topically 2 (two) times daily as needed. 45 g 1   valACYclovir (VALTREX) 500 MG tablet 1 TAB TWICE DAILY FOR 3 DAYS WITH OUTBREAKS, START WITHIN 48 HOURS AFTER ONSET. 18 tablet 1   No current facility-administered medications on file prior to visit.   Past Medical History:  Diagnosis Date   COVID-19    GERD (gastroesophageal reflux  disease)    Headache(784.0)    Hypertension    OSA (obstructive sleep apnea) 08/28/2017   Pneumonia    Allergies  Allergen Reactions   Other Anaphylaxis, Shortness Of Breath and Swelling    [place] NUTS    Social History   Socioeconomic History   Marital status: Divorced    Spouse name: Not on file   Number of children: 1   Years of education: College   Highest education level: Not on file  Occupational History    Comment: Glass blower/designer  Tobacco Use   Smoking status: Former    Packs/day: 0.25    Years: 16.00    Pack years: 4.00    Types: Cigarettes    Quit date: 03/09/2018    Years since quitting: 2.9   Smokeless tobacco: Never  Vaping Use   Vaping Use: Never used  Substance and Sexual Activity   Alcohol use: Yes    Comment: socially   Drug use: No   Sexual activity: Not Currently    Birth control/protection: Post-menopausal  Other Topics Concern   Not on file  Social History Narrative   Lives at home alone   Right-handed   Drinks decaf coffee   Social Determinants of Health   Financial Resource Strain: Not on file  Food Insecurity: Not on file  Transportation Needs: Not on file  Physical Activity: Not on file  Stress: Not on file  Social Connections: Not on file   Vitals:   01/30/21 0732  BP: 128/80  Resp: 16  Temp: 98.4 F (36.9 C)   Wt Readings from Last 3 Encounters:  01/30/21 248 lb (112.5 kg)  01/20/21 253 lb 9.6 oz (115 kg)  11/21/20 254 lb (115.2 kg)   Body mass index is 36.62 kg/m.  Physical Exam Vitals and nursing note reviewed.  Constitutional:      General: She is not in acute distress.    Appearance: She is well-developed.  HENT:     Head: Normocephalic and atraumatic.     Mouth/Throat:     Mouth: Mucous membranes are moist.     Pharynx: Oropharynx is clear.  Eyes:     Conjunctiva/sclera: Conjunctivae normal.  Cardiovascular:     Rate and Rhythm: Normal rate and regular rhythm.     Pulses:          Dorsalis pedis  pulses are 2+ on the right side and 2+ on the left side.     Heart sounds: No murmur heard. Pulmonary:     Effort: Pulmonary effort is normal. No respiratory distress.     Breath sounds: Normal breath sounds.  Abdominal:     Palpations: Abdomen is soft. There is no hepatomegaly or mass.     Tenderness: There is no abdominal tenderness.  Lymphadenopathy:     Cervical: No cervical adenopathy.  Skin:    General:  Skin is warm.     Findings: No erythema or rash.  Neurological:     General: No focal deficit present.     Mental Status: She is alert and oriented to person, place, and time.     Cranial Nerves: No cranial nerve deficit.     Gait: Gait normal.  Psychiatric:     Comments: Well groomed, good eye contact.   ASSESSMENT AND PLAN:  [name] was seen today for results.  Diagnoses and all orders for this visit: Orders Placed This Encounter  Procedures   Basic metabolic panel   Hemoglobin A1c   Lab Results  Component Value Date   HGBA1C 5.6 01/30/2021   Lab Results  Component Value Date   CREATININE 0.92 01/30/2021   BUN 14 01/30/2021   NA 138 01/30/2021   K 3.5 01/30/2021   CL 103 01/30/2021   CO2 26 01/30/2021   Prediabetes A healthy life style recommended for diabetes prevention. Further recommendations according to HgA1C result.  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 Kegel and pelvic floor exercises recommended. We discussed some side effects of pharmacologic options, will hold on this for now.  Morbid obesity (HCC) BMI 36 and hx of HTN,GERD,and urine incontinence. She understands the benefits of wt loss as well as adverse effects of obesity. Consistency with healthy diet and physical activity recommended. Daily brisk walking for 15-30 min as tolerated. She would like to try Ozempic, recommend starting with 0.25 mg weekly and titrate dose as tolerated to 1 mg weekly. Medication side effects discussed.  Essential hypertension BP adequately  controlled. Continue current management: Metoprolol tartrate 25 mg 1/2 tab bid,Amlodipine 5 mg daily, and HCTZ 25 mg daily. DASH/low salt diet to continue. Monitor BP at home. Eye exam is current.  Return in about 3 months (around 05/02/2021) for wt check.  Brianna Bines G. Martinique, MD  [facility]. [place] office.

## 2021-01-30 ENCOUNTER — Ambulatory Visit (INDEPENDENT_AMBULATORY_CARE_PROVIDER_SITE_OTHER): Payer: BC Managed Care – PPO | Admitting: Family Medicine

## 2021-01-30 ENCOUNTER — Other Ambulatory Visit: Payer: Self-pay

## 2021-01-30 ENCOUNTER — Encounter: Payer: Self-pay | Admitting: Family Medicine

## 2021-01-30 ENCOUNTER — Telehealth: Payer: Self-pay | Admitting: Family Medicine

## 2021-01-30 VITALS — BP 128/80 | Temp 98.4°F | Resp 16 | Ht 69.0 in | Wt 248.0 lb

## 2021-01-30 DIAGNOSIS — I10 Essential (primary) hypertension: Secondary | ICD-10-CM

## 2021-01-30 DIAGNOSIS — R32 Unspecified urinary incontinence: Secondary | ICD-10-CM | POA: Diagnosis not present

## 2021-01-30 DIAGNOSIS — R7303 Prediabetes: Secondary | ICD-10-CM

## 2021-01-30 LAB — BASIC METABOLIC PANEL
BUN: 14 mg/dL (ref 6–23)
CO2: 26 mEq/L (ref 19–32)
Calcium: 9.8 mg/dL (ref 8.4–10.5)
Chloride: 103 mEq/L (ref 96–112)
Creatinine, Ser: 0.92 mg/dL (ref 0.40–1.20)
GFR: 66.07 mL/min (ref >60.00)
Glucose, Bld: 102 mg/dL — ABNORMAL HIGH (ref 70–99)
Potassium: 3.5 mEq/L (ref 3.5–5.1)
Sodium: 138 mEq/L (ref 135–145)

## 2021-01-30 LAB — HEMOGLOBIN A1C: Hgb A1c MFr Bld: 5.6 % (ref 4.6–6.5)

## 2021-01-30 MED ORDER — OZEMPIC (0.25 OR 0.5 MG/DOSE) 2 MG/1.5ML ~~LOC~~ SOPN: 1.0000 mg | PEN_INJECTOR | SUBCUTANEOUS | 1 refills | Status: DC

## 2021-01-30 NOTE — Assessment & Plan Note (Signed)
Chronic. Kegel and pelvic floor exercises recommended. We discussed some side effects of pharmacologic options, will hold on this for now.

## 2021-01-30 NOTE — Telephone Encounter (Signed)
Pt is calling and ozempic is not covered on her prescription plan and pt needs something else sent to   [id number] - [place], [place]. Phone:  [phone]  Fax:  [phone]

## 2021-01-30 NOTE — Assessment & Plan Note (Addendum)
BMI 36 and hx of HTN,GERD,and urine incontinence. She understands the benefits of wt loss as well as adverse effects of obesity. Consistency with healthy diet and physical activity recommended. Daily brisk walking for 15-30 min as tolerated. She would like to try Ozempic, recommend starting with 0.25 mg weekly and titrate dose as tolerated to 1 mg weekly. Medication side effects discussed.

## 2021-01-30 NOTE — Patient Instructions (Addendum)
A few things to remember from today's visit:  Essential hypertension - Plan: Basic metabolic panel  Prediabetes - Plan: Hemoglobin A1c  Morbid obesity ([place])  Incontinence of urine in female  Start counting calories, 1700 kcl/day. Walking 10-15 min daily. Ozempic to start weekly 0.25 mg, double dose every 2-3 weeks as tolerated. We plan on being on medication for 6 months.  If you need refills please call your pharmacy. Do not use My Chart to request refills or for acute issues that need immediate attention.   Kegel Exercises Kegel exercises can help strengthen your pelvic floor muscles. The pelvic floor is a group of muscles that support your rectum, small intestine, and bladder. In females, pelvic floor muscles also help support the womb (uterus). These muscles help you control the flow of urine and stool. Kegel exercises are painless and simple, and they do not require any equipment. Your provider may suggest Kegel exercises to: Improve bladder and bowel control. Improve sexual response. Improve weak pelvic floor muscles after surgery to remove the uterus (hysterectomy) or pregnancy (females). Improve weak pelvic floor muscles after prostate gland removal or surgery (males). Kegel exercises involve squeezing your pelvic floor muscles, which are the same muscles you squeeze when you try to stop the flow of urine or keep from passing gas. The exercises can be done while sitting, standing, or lying down, but it is best to vary your position. Exercises How to do Kegel exercises: Squeeze your pelvic floor muscles tight. You should feel a tight lift in your rectal area. If you are a female, you should also feel a tightness in your vaginal area. Keep your stomach, buttocks, and legs relaxed. Hold the muscles tight for up to 10 seconds. Breathe normally. Relax your muscles. Repeat as told by your health care provider. Repeat this exercise daily as told by your health care provider.  Continue to do this exercise for at least 4-6 weeks, or for as long as told by your health care provider. You may be referred to a physical therapist who can help you learn more about how to do Kegel exercises. Depending on your condition, your health care provider may recommend: Varying how long you squeeze your muscles. Doing several sets of exercises every day. Doing exercises for several weeks. Making Kegel exercises a part of your regular exercise routine. This information is not intended to replace advice given to you by your health care provider. Make sure you discuss any questions you have with your health care provider. Document Revised: 03/16/2020 Document Reviewed: 11/13/2017 Elsevier Patient Education  [place].  Please be sure medication list is accurate. If a new problem present, please set up appointment sooner than planned today.

## 2021-01-30 NOTE — Telephone Encounter (Signed)
Submitted PA to see if medication can be covered. Key: [id number]

## 2021-01-30 NOTE — Assessment & Plan Note (Signed)
BP adequately controlled. Continue current management: Metoprolol tartrate 25 mg 1/2 tab bid,Amlodipine 5 mg daily, and HCTZ 25 mg daily. DASH/low salt diet to continue. Monitor BP at home. Eye exam is current.

## 2021-02-04 ENCOUNTER — Other Ambulatory Visit: Payer: Self-pay | Admitting: Family Medicine

## 2021-02-04 DIAGNOSIS — I10 Essential (primary) hypertension: Secondary | ICD-10-CM

## 2021-02-06 MED ORDER — SAXENDA 18 MG/3ML ~~LOC~~ SOPN: 3.0000 mg | PEN_INJECTOR | Freq: Every day | SUBCUTANEOUS | 1 refills | Status: DC

## 2021-02-06 NOTE — Telephone Encounter (Signed)
Another options is saxenda 3 mg daily. If this is not covered, she can continue with calorie count (1700 Kcal/day and regular physical activity. Wt loss clinic can also be considered. Thanks, BJ

## 2021-02-06 NOTE — Telephone Encounter (Signed)
I called and spoke with patient. She is aware a prescription for Saxenda has been sent in.

## 2021-02-06 NOTE — Addendum Note (Signed)
Addended by: Rodrigo Ran on: 02/06/2021 09:03 AM   Modules accepted: Orders

## 2021-02-07 ENCOUNTER — Ambulatory Visit: Payer: BC Managed Care – PPO

## 2021-02-08 NOTE — Telephone Encounter (Signed)
I called and spoke with patient. I let her know that the Saxenda was not covered. She is going to reach out to her insurance and see if there are any medications they will cover.

## 2021-02-24 ENCOUNTER — Encounter (HOSPITAL_COMMUNITY): Payer: Self-pay | Admitting: *Deleted

## 2021-02-24 ENCOUNTER — Other Ambulatory Visit: Payer: Self-pay

## 2021-02-24 NOTE — Progress Notes (Signed)
Spoke with pt for pre-op call. Pt has hx of HTN, denies cardiac history or diabetes.   Pt's surgery is scheduled as ambulatory so no Covid test is required prior to surgery.

## 2021-02-27 ENCOUNTER — Other Ambulatory Visit: Payer: Self-pay | Admitting: Family Medicine

## 2021-02-27 DIAGNOSIS — L309 Dermatitis, unspecified: Secondary | ICD-10-CM

## 2021-02-28 ENCOUNTER — Ambulatory Visit (HOSPITAL_COMMUNITY)
Admission: RE | Admit: 2021-02-28 | Discharge: 2021-02-28 | Disposition: A | Discharge status: Home / Self Care | Payer: BC Managed Care – PPO | Source: Ambulatory Visit | Attending: Family Medicine | Admitting: Family Medicine

## 2021-02-28 ENCOUNTER — Ambulatory Visit (HOSPITAL_COMMUNITY)
Admission: RE | Admit: 2021-02-28 | Discharge: 2021-02-28 | Disposition: A | Discharge status: Home / Self Care | Payer: BC Managed Care – PPO | Attending: Family Medicine | Admitting: Family Medicine

## 2021-02-28 ENCOUNTER — Other Ambulatory Visit: Payer: Self-pay

## 2021-02-28 ENCOUNTER — Ambulatory Visit (HOSPITAL_COMMUNITY): Payer: BC Managed Care – PPO | Admitting: Anesthesiology

## 2021-02-28 ENCOUNTER — Encounter (HOSPITAL_COMMUNITY): Payer: Self-pay | Admitting: Family Medicine

## 2021-02-28 ENCOUNTER — Encounter (HOSPITAL_COMMUNITY)
Admission: RE | Disposition: A | Discharge status: Home / Self Care | Payer: Self-pay | Source: Home / Self Care | Attending: Family Medicine

## 2021-02-28 DIAGNOSIS — Z6836 Body mass index (BMI) 36.0-36.9, adult: Secondary | ICD-10-CM | POA: Diagnosis not present

## 2021-02-28 DIAGNOSIS — M7521 Bicipital tendinitis, right shoulder: Secondary | ICD-10-CM | POA: Diagnosis not present

## 2021-02-28 DIAGNOSIS — M25521 Pain in right elbow: Secondary | ICD-10-CM | POA: Diagnosis not present

## 2021-02-28 DIAGNOSIS — E669 Obesity, unspecified: Secondary | ICD-10-CM | POA: Insufficient documentation

## 2021-02-28 DIAGNOSIS — Z87891 Personal history of nicotine dependence: Secondary | ICD-10-CM | POA: Insufficient documentation

## 2021-02-28 DIAGNOSIS — Z79899 Other long term (current) drug therapy: Secondary | ICD-10-CM | POA: Diagnosis not present

## 2021-02-28 DIAGNOSIS — S46111A Strain of muscle, fascia and tendon of long head of biceps, right arm, initial encounter: Secondary | ICD-10-CM | POA: Diagnosis not present

## 2021-02-28 DIAGNOSIS — I10 Essential (primary) hypertension: Secondary | ICD-10-CM | POA: Insufficient documentation

## 2021-02-28 DIAGNOSIS — E876 Hypokalemia: Secondary | ICD-10-CM | POA: Diagnosis not present

## 2021-02-28 DIAGNOSIS — G4733 Obstructive sleep apnea (adult) (pediatric): Secondary | ICD-10-CM | POA: Diagnosis not present

## 2021-02-28 HISTORY — PX: RADIOLOGY WITH ANESTHESIA: SHX6223

## 2021-02-28 HISTORY — DX: Unspecified osteoarthritis, unspecified site: M19.90

## 2021-02-28 SURGERY — MRI WITH ANESTHESIA
Anesthesia: Monitor Anesthesia Care | Laterality: Right

## 2021-02-28 MED ORDER — OXYCODONE HCL 5 MG/5ML PO SOLN: 5.0000 mg | Freq: Once | ORAL | Status: DC | PRN

## 2021-02-28 MED ORDER — LACTATED RINGERS IV SOLN: INTRAVENOUS | Status: DC

## 2021-02-28 MED ORDER — OXYCODONE HCL 5 MG PO TABS: 5.0000 mg | ORAL_TABLET | Freq: Once | ORAL | Status: DC | PRN

## 2021-02-28 MED ORDER — ORAL CARE MOUTH RINSE: 15.0000 mL | Freq: Once | OROMUCOSAL | Status: AC

## 2021-02-28 MED ORDER — PROMETHAZINE HCL 25 MG/ML IJ SOLN: 6.2500 mg | INTRAMUSCULAR | Status: DC | PRN

## 2021-02-28 MED ORDER — PROPOFOL 10 MG/ML IV BOLUS
INTRAVENOUS | Status: DC | PRN
  Administered 2021-02-28 (×3): 40 ug via INTRAVENOUS

## 2021-02-28 MED ORDER — CHLORHEXIDINE GLUCONATE 0.12 % MT SOLN
15.0000 mL | Freq: Once | OROMUCOSAL | Status: AC
  Administered 2021-02-28: 15 mL via OROMUCOSAL
  Filled 2021-02-28: qty 15

## 2021-02-28 MED ORDER — LIDOCAINE HCL (CARDIAC) PF 100 MG/5ML IV SOSY
PREFILLED_SYRINGE | INTRAVENOUS | Status: DC | PRN
  Administered 2021-02-28: 50 mg via INTRATRACHEAL

## 2021-02-28 MED ORDER — HYDROMORPHONE HCL 1 MG/ML IJ SOLN: 0.2500 mg | INTRAMUSCULAR | Status: DC | PRN

## 2021-02-28 MED ORDER — PROPOFOL 500 MG/50ML IV EMUL
INTRAVENOUS | Status: DC | PRN
  Administered 2021-02-28: 50 ug/kg/min via INTRAVENOUS

## 2021-02-28 MED ORDER — MIDAZOLAM HCL 2 MG/2ML IJ SOLN
INTRAMUSCULAR | Status: DC | PRN
  Administered 2021-02-28 (×2): 1 mg via INTRAVENOUS

## 2021-02-28 NOTE — Anesthesia Postprocedure Evaluation (Signed)
Anesthesia Post Note  Patient: Brianna Foster  Procedure(s) Performed: MRI OF THE ELBOW WITHOUT CONTRAST WITH ANESTHESIA (Right)     Patient location during evaluation: PACU Anesthesia Type: MAC Level of consciousness: awake and alert Pain management: pain level controlled Vital Signs Assessment: post-procedure vital signs reviewed and stable Respiratory status: spontaneous breathing, nonlabored ventilation and respiratory function stable Cardiovascular status: blood pressure returned to baseline and stable Postop Assessment: no apparent nausea or vomiting Anesthetic complications: no   No notable events documented.  Last Vitals:  Vitals:   02/28/21 1145 02/28/21 1200  BP: (!) 148/73 (!) 152/80  Pulse: (!) 51 (!) 52  Resp: 16 16  Temp:  36.6 C  SpO2: 100% 100%    Last Pain:  Vitals:   02/28/21 1200  TempSrc:   PainSc: 0-No pain                 Lynda Rainwater

## 2021-02-28 NOTE — Anesthesia Preprocedure Evaluation (Signed)
Anesthesia Evaluation  Patient identified by MRN, date of birth, ID band Patient awake    Reviewed: Allergy & Precautions, H&P , NPO status , Patient's Chart, lab work & pertinent test results  Airway Mallampati: II  TM Distance: >3 FB Neck ROM: Full    Dental no notable dental hx.    Pulmonary sleep apnea , former smoker,    Pulmonary exam normal breath sounds clear to auscultation       Cardiovascular hypertension, Pt. on medications negative cardio ROS Normal cardiovascular exam Rhythm:Regular Rate:Normal     Neuro/Psych  Headaches, negative psych ROS   GI/Hepatic Neg liver ROS, GERD  ,  Endo/Other  negative endocrine ROS  Renal/GU negative Renal ROS  negative genitourinary   Musculoskeletal  (+) Arthritis , Osteoarthritis,    Abdominal (+) + obese,   Peds negative pediatric ROS (+)  Hematology negative hematology ROS (+)   Anesthesia Other Findings   Reproductive/Obstetrics negative OB ROS                             Anesthesia Physical Anesthesia Plan  ASA: 2  Anesthesia Plan: MAC   Post-op Pain Management:    Induction: Intravenous  PONV Risk Score and Plan: 2 and Ondansetron, Midazolam and Treatment may vary due to age or medical condition  Airway Management Planned: Simple Face Mask  Additional Equipment:   Intra-op Plan:   Post-operative Plan:   Informed Consent: I have reviewed the patients History and Physical, chart, labs and discussed the procedure including the risks, benefits and alternatives for the proposed anesthesia with the patient or authorized representative who has indicated his/her understanding and acceptance.     Dental advisory given  Plan Discussed with: CRNA  Anesthesia Plan Comments:         Anesthesia Quick Evaluation

## 2021-02-28 NOTE — Transfer of Care (Signed)
Immediate Anesthesia Transfer of Care Note  Patient: Brianna Foster  Procedure(s) Performed: MRI OF THE ELBOW WITHOUT CONTRAST WITH ANESTHESIA (Right)  Patient Location: PACU  Anesthesia Type:MAC  Level of Consciousness: awake and alert   Airway & Oxygen Therapy: Patient Spontanous Breathing  Post-op Assessment: Report given to RN and Post -op Vital signs reviewed and stable  Post vital signs: Reviewed and stable  Last Vitals:  Vitals Value Taken Time  BP 152/80 02/28/21 1200  Temp 36.6 C 02/28/21 1200  Pulse 52 02/28/21 1200  Resp 16 02/28/21 1200  SpO2 100 % 02/28/21 1200    Last Pain:  Vitals:   02/28/21 1200  TempSrc:   PainSc: 0-No pain         Complications: No notable events documented.

## 2021-02-28 NOTE — Anesthesia Procedure Notes (Signed)
Procedure Name: MAC Date/Time: 02/28/2021 10:15 AM Performed by: Lorie Phenix, CRNA Pre-anesthesia Checklist: Patient identified, Emergency Drugs available, Suction available, Timeout performed and Patient being monitored Patient Re-evaluated:Patient Re-evaluated prior to induction Oxygen Delivery Method: Nasal cannula Preoxygenation: Pre-oxygenation with 100% oxygen Induction Type: IV induction Placement Confirmation: positive ETCO2

## 2021-03-01 ENCOUNTER — Encounter (HOSPITAL_COMMUNITY): Payer: Self-pay | Admitting: Radiology

## 2021-03-01 NOTE — Progress Notes (Signed)
MRI of the elbow shows distal biceps tendinitis with possible partial tear.  Additionally you have evidence of tennis elbow.  Recommend return to clinic to discuss treatment plan and options going forward as well as the full detailed MRI results and looking at the pictures.

## 2021-03-06 ENCOUNTER — Encounter: Payer: Self-pay | Admitting: Family Medicine

## 2021-03-06 ENCOUNTER — Ambulatory Visit (INDEPENDENT_AMBULATORY_CARE_PROVIDER_SITE_OTHER): Payer: BC Managed Care – PPO | Admitting: Family Medicine

## 2021-03-06 ENCOUNTER — Other Ambulatory Visit: Payer: Self-pay

## 2021-03-06 VITALS — BP 110/72 | HR 53 | Ht 69.0 in | Wt 249.0 lb

## 2021-03-06 DIAGNOSIS — M7711 Lateral epicondylitis, right elbow: Secondary | ICD-10-CM

## 2021-03-06 DIAGNOSIS — M7521 Bicipital tendinitis, right shoulder: Secondary | ICD-10-CM | POA: Diagnosis not present

## 2021-03-06 MED ORDER — NITROGLYCERIN 0.2 MG/HR TD PT24: MEDICATED_PATCH | TRANSDERMAL | 1 refills | Status: DC

## 2021-03-06 NOTE — Progress Notes (Signed)
I, Wendy Poet, LAT, ATC, am serving as scribe for Dr. Lynne Leader.  Brianna Foster is a 65 y.o. female who presents to [facility] today for f/u of R upper arm, anterior elbow pain thought to be due to distal biceps tendinitis.  She was last seen by Dr. Georgina Snell on 01/20/21 and was prescribed Gabapentin, advised to wear an elbow compression sleeve and to proceed to MRI w/ IV sedation. Today, pt reports that her R elbow and upper arm are the same.  She is not taking the Gabapentin on a regular basis as it has not helped.  Dx imaging: 02/28/21 R elbow MRI  01/20/21 R elbow XR  Pertinent review of systems: No fevers or chills  Relevant historical information: Sleep apnea.  Obesity.   Exam:  BP 110/72 (BP Location: Right Arm, Patient Position: Sitting, Cuff Size: Large)   Pulse (!) 53   Ht 5\' 9"  (1.753 m)   Wt 249 lb (112.9 kg)   LMP 01/07/2010 (LMP Unknown)   SpO2 95%   BMI 36.77 kg/m  General: Well Developed, well nourished, and in no acute distress.   MSK: Right elbow.  Normal appearing.  Tender palpation distal biceps tendon.  Palpation lateral epicondyle. Pain with resisted elbow flexion and resisted wrist extension.    Lab and Radiology Results EXAM: MRI OF THE RIGHT ELBOW WITHOUT CONTRAST   TECHNIQUE: Multiplanar, multisequence MR imaging of the elbow was performed. No intravenous contrast was administered.   COMPARISON:  Radiographs 01/20/2021   FINDINGS: Suboptimal position with the elbow flexed about 52 degrees, the patient was scanned under anaesthesia and there was limited ability to position the elbow in an optimal manner.   TENDONS   Common forearm flexor origin: Unremarkable   Common forearm extensor origin: Mild to moderate tendinopathy of the common extensor tendon as shown on image 13 series 6 and image 9 series 4, without a well-defined tear.   Biceps: Expansion of the distal biceps tendon with  accentuated internal T2 signal and surrounding fluid signal and edema both around the distal tendon and around the attachment site as shown on images 15 through 22 of series 4, compatible with substantial distal biceps tendinopathy, tenosynovitis, and peritendinitis. The biceps is not overtly torn. Both the long and short head contributions to the biceps tendon appear affected. Difficult to exclude a component of partial tearing but neither the long nor the short head contributions are completely torn.   Triceps: Unremarkable   LIGAMENTS   Medial stabilizers: Unremarkable   Lateral stabilizers:  Unremarkable   Cartilage: Unremarkable   Joint: No effusion.   Cubital tunnel: No impingement or substantial expansion of the ulnar nerve.   Bones: Small focus of edema along the lateral epicondyle, likely reactive related to the common extensor tendinopathy.   IMPRESSION: 1. Substantial distal biceps tendinopathy, tenosynovitis, and peritendinitis, potentially with a small component of partial tearing but neither the long nor the short head contribution state the distal biceps tendon appear completely torn. 2. Mild to moderate tendinopathy of the common extensor tendon. Small amount of adjacent reactive marrow edema in the lateral epicondyle.     Electronically Signed   By: Van Clines M.D.   On: 03/01/2021 11:54   I, Lynne Leader, personally (independently) visualized and performed the interpretation of the images attached in this note.      Assessment and Plan: 65 y.o. female with right elbow pain due to distal biceps tendinitis and lateral epicondylitis.  She had trial of home exercise program which was unsuccessful.  Based on MRI report I think she probably can get better with trial of dedicated hand physical therapy and nitroglycerin patch protocol especially at the distal biceps tendon.  If not improved would consider either a steroid or PRP injection although  neither are very appealing at this time. Check back in about 6 weeks. Total encounter time 20 minutes including face-to-face time with the patient and, reviewing past medical record, and charting on the date of service.   Reviewed MRI findings discussed treatment plan and options  PDMP not reviewed this encounter. Orders Placed This Encounter  Procedures   Ambulatory referral to Physical Therapy    Referral Priority:   Routine    Referral Type:   Physical Medicine    Referral Reason:   Specialty Services Required    Requested Specialty:   Physical Therapy    Number of Visits Requested:   1   Meds ordered this encounter  Medications   nitroGLYCERIN (NITRODUR - DOSED IN MG/24 HR) 0.2 mg/hr patch    Sig: Place 1/4 to 1/2 of a patch over affected region. Remove and replace once daily.  Slightly alter skin placement daily    Dispense:  30 patch    Refill:  1    For musculoskeletal purposes.  Okay to cut patch.     Discussed warning signs or symptoms. Please see discharge instructions. Patient expresses understanding.   The above documentation has been reviewed and is accurate and complete Lynne Leader, M.D.

## 2021-03-06 NOTE — Patient Instructions (Addendum)
Good to see you today.  Nitroglycerin Protocol  Apply 1/4 nitroglycerin patch to affected area daily. Change position of patch within the affected area every 24 hours. You may experience a headache during the first 1-2 weeks of using the patch, these should subside. If you experience headaches after beginning nitroglycerin patch treatment, you may take your preferred over the counter pain reliever. Another side effect of the nitroglycerin patch is skin irritation or rash related to patch adhesive. Please notify our office if you develop more severe headaches or rash, and stop the patch. Tendon healing with nitroglycerin patch may require 12 to 24 weeks depending on the extent of injury. Men should not use if taking Viagra, Cialis, or Levitra.  Do not use if you have migraines or rosacea.    I've referred you to Physical Therapy.  If you do not hear from them in one week, regarding scheduling, please let us know.  Follow-up: 6 weeks

## 2021-03-07 NOTE — H&P (Signed)
Brianna Foster presents radiology department for MRI requiring sedation.  LARIZA Foster is an 65 y.o. female.   HPI: Right elbow pain thought to be biceps tendinitis failing conservative management requiring MRI.  Patient has significant MRI related anxiety and requires moderate sedation.  Past Medical History:  Diagnosis Date   Arthritis    COVID-19 03/2020   had pneumonia   GERD (gastroesophageal reflux disease)    Headache(784.0)    Hypertension    OSA (obstructive sleep apnea) 08/28/2017   uses a Cpap   Pneumonia     Past Surgical History:  Procedure Laterality Date   CERVICAL FUSION     LEFT HEART CATH AND CORONARY ANGIOGRAPHY N/A 07/11/2017   Procedure: LEFT HEART CATH AND CORONARY ANGIOGRAPHY;  Surgeon: Martinique, Peter M, MD;  Location: [facility];  Service: Cardiovascular;  Laterality: N/A;   LEFT HEART CATHETERIZATION WITH CORONARY ANGIOGRAM N/A 05/11/2013   Procedure: LEFT HEART CATHETERIZATION WITH CORONARY ANGIOGRAM;  Surgeon: Burnell Blanks, MD;  Location: [facility];  Service: Cardiovascular;  Laterality: N/A;   neck fusion     RADIOLOGY WITH ANESTHESIA Right 02/28/2021   Procedure: MRI OF THE ELBOW WITHOUT CONTRAST WITH ANESTHESIA;  Surgeon: Radiologist, Medication, MD;  Location: [facility];  Service: Radiology;  Laterality: Right;    Family History  Problem Relation Age of Onset   Lung cancer Mother    Stomach cancer Father    Heart disease Brother        Pacemaker   Esophageal cancer Brother    Breast cancer Sister    Colon cancer Neg Hx    Pancreatic cancer Neg Hx    Liver disease Neg Hx    Rectal cancer Neg Hx    Social History:  reports that she quit smoking about 2 years ago. Her smoking use included cigarettes. She has a 4.00 pack-year smoking history. She has never used smokeless tobacco. She reports current alcohol use. She reports that she does not use drugs.  Allergies:  Allergies  Allergen Reactions   Other Anaphylaxis, Shortness Of  Breath and Swelling    [place] NUTS    No medications prior to admission.    Recent Results (from the past 2160 hour(s))  Hemoglobin A1c     Status: None   Collection Time: 01/30/21  8:10 AM  Result Value Ref Range   Hgb A1c MFr Bld 5.6 4.6 - 6.5 %    Comment: Glycemic Control Guidelines for People with Diabetes:Non Diabetic:  <6%Goal of Therapy: <7%Additional Action Suggested:  >1%   Basic metabolic panel     Status: Abnormal   Collection Time: 01/30/21  8:10 AM  Result Value Ref Range   Sodium 138 135 - 145 mEq/L   Potassium 3.5 3.5 - 5.1 mEq/L   Chloride 103 96 - 112 mEq/L   CO2 26 19 - 32 mEq/L   Glucose, Bld 102 (H) 70 - 99 mg/dL   BUN 14 6 - 23 mg/dL   Creatinine, Ser 0.92 0.40 - 1.20 mg/dL   GFR 66.07 >60.00 mL/min    Comment: Calculated using the CKD-EPI Creatinine Equation (2021)   Calcium 9.8 8.4 - 10.5 mg/dL     Review of Systems  Blood pressure (!) 152/80, pulse (!) 52, temperature 97.9 F (36.6 C), resp. rate 16, height 5\' 9"  (1.753 m), weight 112 kg, last menstrual period 01/07/2010, SpO2 100 %. Physical Exam   Assessment/Plan Plan to proceed with moderate sedation MRI under anesthesiology care.  Follow-up  in my clinic after MRI results are back for result review.  Lynne Leader, MD 03/07/2021, 6:26 AM

## 2021-03-14 ENCOUNTER — Ambulatory Visit
Admission: RE | Admit: 2021-03-14 | Discharge: 2021-03-14 | Disposition: A | Discharge status: Home / Self Care | Payer: BC Managed Care – PPO | Source: Ambulatory Visit | Attending: Family Medicine | Admitting: Family Medicine

## 2021-03-14 DIAGNOSIS — Z1231 Encounter for screening mammogram for malignant neoplasm of breast: Secondary | ICD-10-CM | POA: Diagnosis not present

## 2021-03-21 ENCOUNTER — Ambulatory Visit: Payer: 59 | Admitting: Nurse Practitioner

## 2021-04-06 DIAGNOSIS — G4733 Obstructive sleep apnea (adult) (pediatric): Secondary | ICD-10-CM | POA: Diagnosis not present

## 2021-04-12 ENCOUNTER — Other Ambulatory Visit: Payer: Self-pay | Admitting: Gastroenterology

## 2021-04-19 ENCOUNTER — Other Ambulatory Visit: Payer: Self-pay

## 2021-04-19 ENCOUNTER — Encounter: Payer: Self-pay | Admitting: Nurse Practitioner

## 2021-04-19 ENCOUNTER — Ambulatory Visit (INDEPENDENT_AMBULATORY_CARE_PROVIDER_SITE_OTHER): Payer: 59 | Admitting: Nurse Practitioner

## 2021-04-19 VITALS — BP 124/82 | Ht 69.0 in | Wt 245.0 lb

## 2021-04-19 DIAGNOSIS — N898 Other specified noninflammatory disorders of vagina: Secondary | ICD-10-CM | POA: Diagnosis not present

## 2021-04-19 DIAGNOSIS — D250 Submucous leiomyoma of uterus: Secondary | ICD-10-CM | POA: Diagnosis not present

## 2021-04-19 DIAGNOSIS — R300 Dysuria: Secondary | ICD-10-CM | POA: Diagnosis not present

## 2021-04-19 DIAGNOSIS — Z01419 Encounter for gynecological examination (general) (routine) without abnormal findings: Secondary | ICD-10-CM | POA: Diagnosis not present

## 2021-04-19 DIAGNOSIS — Z780 Asymptomatic menopausal state: Secondary | ICD-10-CM | POA: Diagnosis not present

## 2021-04-19 DIAGNOSIS — L900 Lichen sclerosus et atrophicus: Secondary | ICD-10-CM | POA: Diagnosis not present

## 2021-04-19 DIAGNOSIS — R102 Pelvic and perineal pain: Secondary | ICD-10-CM | POA: Diagnosis not present

## 2021-04-19 LAB — WET PREP FOR TRICH, YEAST, CLUE

## 2021-04-19 MED ORDER — CLOBETASOL PROPIONATE 0.05 % EX OINT: 1.0000 "application " | TOPICAL_OINTMENT | CUTANEOUS | 1 refills | Status: AC

## 2021-04-19 NOTE — Progress Notes (Signed)
Brianna Foster 11-Aug-1955 [id number]   History:  66 y.o. G1P0001 presents for annual exam. Complains of lower abdominal pressure and some urinary frequency (worse at night) x 1 month. Denies other urinary symptoms. History of uterine fibroid last seen 06/2019 measuring 3.7 cm. Denies other urinary symptoms. Has intermittent vaginal odor and discharge. Also complains of vulvar/perineal itching that's worse at night. She does report recent history of constipation. Postmenopausal - no HRT, no bleeding. Normal pap history. HTN, prediabetes managed by PCP.   Gynecologic History Patient's last menstrual period was 01/07/2010 (lmp unknown).   Contraception/Family planning: post menopausal status  Health Maintenance Last Pap: 07/20/2019. Results were: Normal, 5-year repeat Last mammogram: 03/14/2021. Results were: Normal Last colonoscopy: 04/27/2015. Results were: Adenoma, 5-year recall Last Dexa: Not indicated  Past medical history, past surgical history, family history and social history were all reviewed and documented in the EPIC chart. Daughter lives in [place], 3 children. Works in Editor, commissioning for [employer].   ROS:  A ROS was performed and pertinent positives and negatives are included.  Exam:  Vitals:   04/19/21 1206  BP: 124/82  Weight: 245 lb (111.1 kg)  Height: 5\' 9"  (1.753 m)   Body mass index is 36.18 kg/m.  General appearance:  Normal Thyroid:  Symmetrical, normal in size, without palpable masses or nodularity. Respiratory  Auscultation:  Clear without wheezing or rhonchi Cardiovascular  Auscultation:  Regular rate, without rubs, murmurs or gallops  Edema/varicosities:  Not grossly evident Abdominal  Soft,nontender, without masses, guarding or rebound.  Liver/spleen:  No organomegaly noted  Hernia:  None appreciated  Skin  Inspection:  Grossly normal Breasts: Examined lying and sitting.   Right: Without masses, retractions, nipple discharge or axillary  adenopathy.   Left: Without masses, retractions, nipple discharge or axillary adenopathy. Genitourinary   Inguinal/mons:  Normal without inguinal adenopathy  External genitalia:  Normal appearing vulva with no masses, tenderness, or lesions  BUS/Urethra/Skene's glands:  Normal  Vagina:  Normal appearing with normal color and discharge, no lesions  Cervix:  Normal appearing without discharge or lesions  Uterus:  Difficult to palpate due to body habitus, mild tenderness   Adnexa/parametria:     Rt: Normal in size, without masses or tenderness.   Lt: Normal in size, without masses or tenderness.  Anus and perineum: Pearly appearance along perineum and anus, mild excoriation from scratching present  Digital rectal exam: Normal sphincter tone without palpated masses or tenderness  Wet prep negative UA 1+ leukocytes, negative nitrites, negative blood, SG 1.020, slightly cloudy/yellow. Microscopic: wbc 6-10, rbc none, sq epith 10-20, moderate bacteria  Patient informed chaperone available to be present for breast and pelvic exam. Patient has requested no chaperone to be present. Patient has been advised what will be completed during breast and pelvic exam.   Assessment/Plan:  66 y.o. G1P0001 for annual exam.   Well female exam with routine gynecological exam - Education provided on SBEs, importance of preventative screenings, current guidelines, high calcium diet, regular exercise, and multivitamin daily.  Labs with PCP.   Postmenopausal - no HRT, no bleeding.   Lichen sclerosus - Plan: clobetasol ointment (TEMOVATE) 0.05 % twice weekly. Changes present along perineum and anus. Recommend using twice daily x 14 days, then daily x 14 days, then every other day x 14 days, then twice weekly for maintenance.   Pelvic pressure in female - Plan: Urinalysis,Complete w/RFL Culture, US PELVIS TRANSVAGINAL NON-OB (TV ONLY). Complains of lower abdominal pressure and some urinary frequency (worse  at night)  x 1 month. Denies other urinary symptoms. History of uterine fibroid last seen 06/2019 measuring 3.7 cm. Mild tenderness during pelvic exam. UA unremarkable, reflex culture pending. Does report recent history of constipation. Recommend stool softener and/or laxative, increase fluid intake, high fiber diet.   Vaginal odor - Plan: WET PREP FOR [place], YEAST, CLUE. Negative wet prep.  Submucous leiomyoma of uterus - Plan: US PELVIS TRANSVAGINAL NON-OB (TV ONLY). Last seen 06/2019 measuring 3.7 cm. Will do U/S due to recent changes in symptoms.   Screening for cervical cancer - Normal Pap history.  Will repeat at 5-year interval per guidelines.  Screening for breast cancer - Normal mammogram history.  Continue annual screenings.  Normal breast exam today.  Screening for colon cancer - 04/2015 colonoscopy - adenoma. Looks like 5-year repeat recommended. If so, she is overdue. She plans to call [facility] to follow up.  Screening for osteoporosis - Average risk. Will plan for DXA next year at age 49.   Return in 1 year for annual.   Tamela Gammon DNP, 12:29 PM 04/19/2021

## 2021-04-20 LAB — URINALYSIS, COMPLETE W/RFL CULTURE
Bilirubin Urine: NEGATIVE
Casts: NONE SEEN /LPF
Crystals: NONE SEEN /HPF
Glucose, UA: NEGATIVE
Hgb urine dipstick: NEGATIVE
Hyaline Cast: NONE SEEN /LPF
Ketones, ur: NEGATIVE
Nitrites, Initial: NEGATIVE
Protein, ur: NEGATIVE
RBC / HPF: NONE SEEN /HPF (ref 0–2)
Specific Gravity, Urine: 1.02 (ref 1.001–1.035)
Yeast: NONE SEEN /HPF
pH: 7 (ref 5.0–8.0)

## 2021-04-20 LAB — URINE CULTURE
MICRO NUMBER:: 12856843
SPECIMEN QUALITY:: ADEQUATE

## 2021-04-20 LAB — CULTURE INDICATED

## 2021-04-27 ENCOUNTER — Other Ambulatory Visit: Payer: 59 | Admitting: Obstetrics and Gynecology

## 2021-04-27 ENCOUNTER — Other Ambulatory Visit: Payer: 59

## 2021-05-01 ENCOUNTER — Ambulatory Visit: Payer: BC Managed Care – PPO | Admitting: Family Medicine

## 2021-05-31 NOTE — Progress Notes (Unsigned)
GYNECOLOGY  VISIT   HPI: 66 y.o.   Divorced  {Race/ethnicity:[id number]}  female   [place] with Patient's last menstrual period was 01/07/2010 (lmp unknown).   here for pelvic ultrasound.   GYNECOLOGIC HISTORY: Patient's last menstrual period was 01/07/2010 (lmp unknown). Contraception:  PMP Menopausal hormone therapy:  none Last mammogram:  03-14-21 Neg/BiRads1 Last pap smear:  07-20-19 Neg/Neg HR HPV, 05-08-16 Neg. 01-05-15 Neg        OB History     Gravida  1   Para  1   Term  1   Preterm      AB  0   Living  1      SAB      IAB      Ectopic  0   Multiple      Live Births                 Patient Active Problem List   Diagnosis Date Noted   Morbid obesity ([place]) 01/30/2021   Incontinence of urine in female 01/30/2021   Pneumonia due to COVID-19 virus 05/10/2020   Hypokalemia 04/13/2020   Healthcare maintenance 12/24/2018   Rhinitis, allergic 06/23/2018   OSA (obstructive sleep apnea) 08/28/2017   Recurrent genital herpes 07/19/2017   Unstable angina ([place]) 07/11/2017   De Quervain's tenosynovitis, right 05/08/2016   Cervical radiculopathy 05/08/2016   Benign paroxysmal positional vertigo 03/20/2016   Eczema 03/20/2016   Hyperlipidemia, unspecified 03/20/2016   Vertigo 10/28/2015   Headache 10/28/2015   Tobacco abuse 07/23/2012   Gastroesophageal reflux disease 06/18/2011   Essential hypertension 06/18/2011    Past Medical History:  Diagnosis Date   Arthritis    COVID-19 03/2020   had pneumonia   GERD (gastroesophageal reflux disease)    Headache(784.0)    Hypertension    OSA (obstructive sleep apnea) 08/28/2017   uses a Cpap   Pneumonia     Past Surgical History:  Procedure Laterality Date   CERVICAL FUSION     LEFT HEART CATH AND CORONARY ANGIOGRAPHY N/A 07/11/2017   Procedure: LEFT HEART CATH AND CORONARY ANGIOGRAPHY;  Surgeon: Martinique, Peter M, MD;  Location: [facility];  Service: Cardiovascular;  Laterality: N/A;   LEFT HEART  CATHETERIZATION WITH CORONARY ANGIOGRAM N/A 05/11/2013   Procedure: LEFT HEART CATHETERIZATION WITH CORONARY ANGIOGRAM;  Surgeon: Burnell Blanks, MD;  Location: [facility];  Service: Cardiovascular;  Laterality: N/A;   neck fusion     RADIOLOGY WITH ANESTHESIA Right 02/28/2021   Procedure: MRI OF THE ELBOW WITHOUT CONTRAST WITH ANESTHESIA;  Surgeon: Radiologist, Medication, MD;  Location: [facility];  Service: Radiology;  Laterality: Right;    Current Outpatient Medications  Medication Sig Dispense Refill   albuterol (VENTOLIN HFA) 108 (90 Base) MCG/ACT inhaler Inhale 1-2 puffs into the lungs every 6 (six) hours as needed for wheezing or shortness of breath. 6.7 g 0   amLODipine (NORVASC) 5 MG tablet Take 1 tablet (5 mg total) by mouth daily. 90 tablet 2   Ascorbic Acid (VITAMIN C) 1000 MG tablet Take 1,000 mg by mouth daily.     atorvastatin (LIPITOR) 20 MG tablet Take 1 tablet (20 mg total) by mouth daily. (Patient not taking: Reported on 04/19/2021) 90 tablet 3   cholecalciferol (VITAMIN D3) 25 MCG (1000 UNIT) tablet Take 1,000 Units by mouth daily.     clobetasol ointment (TEMOVATE) 3.79 % Apply 1 application topically 2 (two) times a week. Initial dose: twice daily x 14 days, then  decrease to daily x 14 days, then every other day x 14 days, then twice weekly for duration of use 45 g 1   fluticasone (FLONASE) 50 MCG/ACT nasal spray Place 1 spray into both nostrils 2 (two) times daily as needed for allergies or rhinitis. (Patient not taking: Reported on 04/19/2021) 16 g 5   gabapentin (NEURONTIN) 300 MG capsule Take 1 capsule (300 mg total) by mouth 3 (three) times daily as needed. (Patient not taking: Reported on 04/19/2021) 90 capsule 3   hydrochlorothiazide (HYDRODIURIL) 25 MG tablet Take 1 tablet (25 mg total) by mouth daily. 90 tablet 2   ibuprofen (ADVIL) 600 MG tablet Take 1 tablet (600 mg total) by mouth every 8 (eight) hours as needed for moderate pain. (Patient not taking: Reported on  04/19/2021) 60 tablet 1   Liraglutide -Weight Management (SAXENDA) 18 MG/3ML SOPN Inject 3 mg into the skin daily. (Patient not taking: Reported on 02/22/2021) 15 mL 1   meclizine (ANTIVERT) 25 MG tablet Take 1 tablet (25 mg total) by mouth daily as needed for dizziness. 30 tablet 2   metoprolol tartrate (LOPRESSOR) 25 MG tablet TAKE 0.5 TABLETS BY MOUTH 2 TIMES DAILY. 90 tablet 2   nitroGLYCERIN (NITRODUR - DOSED IN MG/24 HR) 0.2 mg/hr patch Place 1/4 to 1/2 of a patch over affected region. Remove and replace once daily.  Slightly alter skin placement daily (Patient not taking: Reported on 04/19/2021) 30 patch 1   pantoprazole (PROTONIX) 40 MG tablet TAKE 1 TABLET BY MOUTH TWICE A DAY 180 tablet 0   Spacer/Aero-Holding Chambers (AEROCHAMBER PLUS) inhaler Use as instructed to use with inahaler. 1 each 1   sucralfate (CARAFATE) 1 g tablet Take 1 tablet (1 g total) by mouth every 6 (six) hours as needed. Slowly dissolve 1 tablet in 1 Tablespoon of distilled water before ingesting 60 tablet 3   triamcinolone cream (KENALOG) 0.1 % APPLY 1 APPLICATION TOPICALLY 2 (TWO) TIMES DAILY AS NEEDED. 45 g 1   valACYclovir (VALTREX) 500 MG tablet 1 TAB TWICE DAILY FOR 3 DAYS WITH OUTBREAKS, START WITHIN 48 HOURS AFTER ONSET. 18 tablet 1   No current facility-administered medications for this visit.     ALLERGIES: Other  Family History  Problem Relation Age of Onset   Lung cancer Mother    Stomach cancer Father    Heart disease Brother        Pacemaker   Esophageal cancer Brother    Breast cancer Sister    Colon cancer Neg Hx    Pancreatic cancer Neg Hx    Liver disease Neg Hx    Rectal cancer Neg Hx     Social History   Socioeconomic History   Marital status: Divorced    Spouse name: Not on file   Number of children: 1   Years of education: College   Highest education level: Not on file  Occupational History    Comment: Glass blower/designer  Tobacco Use   Smoking status: Former    Packs/day:  0.25    Years: 16.00    Pack years: 4.00    Types: Cigarettes    Quit date: 03/09/2018    Years since quitting: 3.2   Smokeless tobacco: Never  Vaping Use   Vaping Use: Never used  Substance and Sexual Activity   Alcohol use: Yes    Comment: socially   Drug use: No   Sexual activity: Not Currently    Birth control/protection: Post-menopausal  Other Topics Concern   Not  on file  Social History Narrative   Lives at home alone   Right-handed   Drinks decaf coffee   Social Determinants of Health   Financial Resource Strain: Not on file  Food Insecurity: Not on file  Transportation Needs: Not on file  Physical Activity: Not on file  Stress: Not on file  Social Connections: Not on file  Intimate Partner Violence: Not on file    Review of Systems  PHYSICAL EXAMINATION:    LMP 01/07/2010 (LMP Unknown)     General appearance: alert, cooperative and appears stated age Head: Normocephalic, without obvious abnormality, atraumatic Neck: no adenopathy, supple, symmetrical, trachea midline and thyroid normal to inspection and palpation Lungs: clear to auscultation bilaterally Breasts: normal appearance, no masses or tenderness, No nipple retraction or dimpling, No nipple discharge or bleeding, No axillary or supraclavicular adenopathy Heart: regular rate and rhythm Abdomen: soft, non-tender, no masses,  no organomegaly Extremities: extremities normal, atraumatic, no cyanosis or edema Skin: Skin color, texture, turgor normal. No rashes or lesions Lymph nodes: Cervical, supraclavicular, and axillary nodes normal. No abnormal inguinal nodes palpated Neurologic: Grossly normal  Pelvic: External genitalia:  no lesions              Urethra:  normal appearing urethra with no masses, tenderness or lesions              Bartholins and Skenes: normal                 Vagina: normal appearing vagina with normal color and discharge, no lesions              Cervix: no lesions                 Bimanual Exam:  Uterus:  normal size, contour, position, consistency, mobility, non-tender              Adnexa: no mass, fullness, tenderness              Rectal exam: {yes no:[id number]}.  Confirms.              Anus:  normal sphincter tone, no lesions  Chaperone was present for exam:  ***  ASSESSMENT     PLAN     An After Visit Summary was printed and given to the patient.  ______ minutes face to face time of which over 50% was spent in counseling.

## 2021-06-01 ENCOUNTER — Other Ambulatory Visit: Payer: 59 | Admitting: Obstetrics and Gynecology

## 2021-06-01 ENCOUNTER — Other Ambulatory Visit: Payer: 59

## 2021-06-03 ENCOUNTER — Other Ambulatory Visit: Payer: Self-pay | Admitting: Family Medicine

## 2021-06-03 DIAGNOSIS — I10 Essential (primary) hypertension: Secondary | ICD-10-CM

## 2021-06-03 DIAGNOSIS — R600 Localized edema: Secondary | ICD-10-CM

## 2021-06-11 ENCOUNTER — Other Ambulatory Visit: Payer: Self-pay | Admitting: Family Medicine

## 2021-06-17 ENCOUNTER — Other Ambulatory Visit: Payer: Self-pay | Admitting: Family Medicine

## 2021-06-17 DIAGNOSIS — I10 Essential (primary) hypertension: Secondary | ICD-10-CM

## 2021-06-20 ENCOUNTER — Ambulatory Visit (INDEPENDENT_AMBULATORY_CARE_PROVIDER_SITE_OTHER): Payer: Self-pay | Admitting: Family Medicine

## 2021-06-20 ENCOUNTER — Other Ambulatory Visit: Payer: Self-pay

## 2021-06-20 ENCOUNTER — Telehealth: Payer: Self-pay | Admitting: Family Medicine

## 2021-06-20 ENCOUNTER — Encounter: Payer: Self-pay | Admitting: Family Medicine

## 2021-06-20 VITALS — BP 128/80 | HR 67 | Temp 98.3°F | Resp 16 | Ht 69.0 in | Wt 254.0 lb

## 2021-06-20 DIAGNOSIS — H60501 Unspecified acute noninfective otitis externa, right ear: Secondary | ICD-10-CM

## 2021-06-20 DIAGNOSIS — J301 Allergic rhinitis due to pollen: Secondary | ICD-10-CM

## 2021-06-20 DIAGNOSIS — R059 Cough, unspecified: Secondary | ICD-10-CM

## 2021-06-20 DIAGNOSIS — J989 Respiratory disorder, unspecified: Secondary | ICD-10-CM

## 2021-06-20 MED ORDER — BENZONATATE 100 MG PO CAPS: 100.0000 mg | ORAL_CAPSULE | Freq: Two times a day (BID) | ORAL | 0 refills | Status: AC | PRN

## 2021-06-20 MED ORDER — ALBUTEROL SULFATE HFA 108 (90 BASE) MCG/ACT IN AERS: 1.0000 | INHALATION_SPRAY | Freq: Four times a day (QID) | RESPIRATORY_TRACT | 0 refills | Status: DC | PRN

## 2021-06-20 MED ORDER — CIPROFLOXACIN-DEXAMETHASONE 0.3-0.1 % OT SUSP: 4.0000 [drp] | Freq: Two times a day (BID) | OTIC | 0 refills | Status: AC

## 2021-06-20 MED ORDER — FLUTICASONE PROPIONATE 50 MCG/ACT NA SUSP: 1.0000 | Freq: Two times a day (BID) | NASAL | 5 refills | Status: DC | PRN

## 2021-06-20 NOTE — Progress Notes (Addendum)
? ? ?ACUTE VISIT ?Chief Complaint  ?Patient presents with  ? Ear Pain  ?  Possible ear infection, fluid in the right ear & pain. Having some nasal drainage, allergies, and ringing in the ear. Started this morning.  ? ?HPI: ?[name] is a 66 y.o. female with history of OSA, allergy rhinitis, GERD, here today complaining of ear ache as described above. ? ?Otalgia  ?There is pain in the right ear. The current episode started today. The problem has been unchanged. There has been no fever. The pain is moderate. Associated symptoms include coughing and rhinorrhea. Pertinent negatives include no abdominal pain, diarrhea, ear discharge, headaches, hearing loss, neck pain, rash, sore throat or vomiting. There is no history of a chronic ear infection, hearing loss or a tympanostomy tube.  ?History of tinnitus. ?Negative for ear trauma. ?Right earache exacerbated by "pushing" ear. ?No sick contact. ? ?A week of non productive cough, worse at night when in bed, it is interfering with sleep. ?COVID 19 test at home negative yesterday. ?"Little" wheezing and occasional some SOB.  Not sure about exacerbating or alleviating factors. ?She has been prescribed albuterol in the past to treat similar symptoms and evaluated by pulmonologist. ? ?Former smoker. ?No associated CP, palpitation,orthopnea,PND,or  diaphoresis. ?Allergies usually worse during pollen season. ? ?GERD, symptoms exacerbated by onions.  Currently she is on Protonix 40 mg bid, she follows with GI. ? ?Review of Systems  ?Constitutional:  Positive for fatigue. Negative for appetite change, chills and fever.  ?HENT:  Positive for ear pain, postnasal drip and rhinorrhea. Negative for ear discharge, hearing loss and sore throat.   ?Respiratory:  Positive for cough.   ?Gastrointestinal:  Negative for abdominal pain, diarrhea and vomiting.  ?Musculoskeletal:  Negative for gait problem and neck pain.  ?Skin:  Negative for rash.  ?Allergic/Immunologic: Positive for  environmental allergies.  ?Neurological:  Negative for syncope and headaches.  ?Hematological:  Negative for adenopathy. Does not bruise/bleed easily.  ?Rest see pertinent positives and negatives per HPI. ? ?Current Outpatient Medications on File Prior to Visit  ?Medication Sig Dispense Refill  ? amLODipine (NORVASC) 5 MG tablet TAKE 1 TABLET (5 MG TOTAL) BY MOUTH DAILY. 90 tablet 2  ? Ascorbic Acid (VITAMIN C) 1000 MG tablet Take 1,000 mg by mouth daily.    ? atorvastatin (LIPITOR) 20 MG tablet Take 1 tablet (20 mg total) by mouth daily. 90 tablet 3  ? cholecalciferol (VITAMIN D3) 25 MCG (1000 UNIT) tablet Take 1,000 Units by mouth daily.    ? clobetasol ointment (TEMOVATE) 4.12 % Apply 1 application topically 2 (two) times a week. Initial dose: twice daily x 14 days, then decrease to daily x 14 days, then every other day x 14 days, then twice weekly for duration of use 45 g 1  ? gabapentin (NEURONTIN) 300 MG capsule Take 1 capsule (300 mg total) by mouth 3 (three) times daily as needed. 90 capsule 3  ? hydrochlorothiazide (HYDRODIURIL) 25 MG tablet TAKE 1 TABLET (25 MG TOTAL) BY MOUTH DAILY. 90 tablet 2  ? ibuprofen (ADVIL) 600 MG tablet Take 1 tablet (600 mg total) by mouth every 8 (eight) hours as needed for moderate pain. 60 tablet 1  ? meclizine (ANTIVERT) 25 MG tablet Take 1 tablet (25 mg total) by mouth daily as needed for dizziness. 30 tablet 2  ? metoprolol tartrate (LOPRESSOR) 25 MG tablet TAKE 0.5 TABLETS BY MOUTH 2 TIMES DAILY. 90 tablet 2  ? nitroGLYCERIN (NITRODUR - DOSED IN  MG/24 HR) 0.2 mg/hr patch PLACE 1/4 TO 1/2 OF A PATCH OVER AFFECTED REGION. REMOVE AND REPLACE ONCE DAILY. SLIGHTLY ALTER SKIN PLACEMENT DAILY 30 patch 1  ? pantoprazole (PROTONIX) 40 MG tablet TAKE 1 TABLET BY MOUTH TWICE A DAY 180 tablet 0  ? Spacer/Aero-Holding Chambers (AEROCHAMBER PLUS) inhaler Use as instructed to use with inahaler. 1 each 1  ? sucralfate (CARAFATE) 1 g tablet Take 1 tablet (1 g total) by mouth every 6 (six)  hours as needed. Slowly dissolve 1 tablet in 1 Tablespoon of distilled water before ingesting 60 tablet 3  ? triamcinolone cream (KENALOG) 0.1 % APPLY 1 APPLICATION TOPICALLY 2 (TWO) TIMES DAILY AS NEEDED. 45 g 1  ? valACYclovir (VALTREX) 500 MG tablet 1 TAB TWICE DAILY FOR 3 DAYS WITH OUTBREAKS, START WITHIN 48 HOURS AFTER ONSET. 18 tablet 1  ? ?No current facility-administered medications on file prior to visit.  ? ?Past Medical History:  ?Diagnosis Date  ? Arthritis   ? COVID-19 03/2020  ? had pneumonia  ? GERD (gastroesophageal reflux disease)   ? Headache(784.0)   ? Hypertension   ? OSA (obstructive sleep apnea) 08/28/2017  ? uses a Cpap  ? Pneumonia   ? ?Allergies  ?Allergen Reactions  ? Other Anaphylaxis, Shortness Of Breath and Swelling  ?  [place] NUTS  ? ?Social History  ? ?Socioeconomic History  ? Marital status: Divorced  ?  Spouse name: Not on file  ? Number of children: 1  ? Years of education: College  ? Highest education level: Not on file  ?Occupational History  ?  Comment: [employer]  ?Tobacco Use  ? Smoking status: Former  ?  Packs/day: 0.25  ?  Years: 16.00  ?  Pack years: 4.00  ?  Types: Cigarettes  ?  Quit date: 03/09/2018  ?  Years since quitting: 3.2  ? Smokeless tobacco: Never  ?Vaping Use  ? Vaping Use: Never used  ?Substance and Sexual Activity  ? Alcohol use: Yes  ?  Comment: socially  ? Drug use: No  ? Sexual activity: Not Currently  ?  Birth control/protection: Post-menopausal  ?Other Topics Concern  ? Not on file  ?Social History Narrative  ? Lives at home alone  ? Right-handed  ? Drinks decaf coffee  ? ?Social Determinants of Health  ? ?Financial Resource Strain: Not on file  ?Food Insecurity: Not on file  ?Transportation Needs: Not on file  ?Physical Activity: Not on file  ?Stress: Not on file  ?Social Connections: Not on file  ? ?Vitals:  ? 06/20/21 1518  ?BP: 128/80  ?Pulse: 67  ?Resp: 16  ?Temp: 98.3 ?F (36.8 ?C)  ?SpO2: 98%  ? ?Body mass index is 37.51 kg/m?. ? ?Physical  Exam ?Vitals and nursing note reviewed.  ?Constitutional:   ?   General: She is not in acute distress. ?   Appearance: She is well-developed. She is not ill-appearing.  ?HENT:  ?   Head: Normocephalic and atraumatic.  ?   Right Ear: Tympanic membrane and external ear normal. Tenderness present.  ?   Left Ear: Tympanic membrane, ear canal and external ear normal.  ?   Ears:  ?   Comments: Slightly erythema on ear canal right side and tenderness upon pressing tragus. No edema or drainage. ?   Nose: Septal deviation and rhinorrhea present.  ?   Right Turbinates: Enlarged.  ?   Left Turbinates: Enlarged.  ?   Mouth/Throat:  ?   Mouth: Mucous  membranes are moist.  ?   Pharynx: Oropharynx is clear.  ?   Comments: Mild post nasal drainage. ?Eyes:  ?   Conjunctiva/sclera: Conjunctivae normal.  ?Cardiovascular:  ?   Rate and Rhythm: Normal rate and regular rhythm.  ?   Heart sounds: No murmur heard. ?Pulmonary:  ?   Effort: Pulmonary effort is normal. No respiratory distress.  ?   Breath sounds: Normal breath sounds. No stridor.  ?Lymphadenopathy:  ?   Head:  ?   Right side of head: No submandibular adenopathy.  ?   Left side of head: No submandibular adenopathy.  ?   Cervical: No cervical adenopathy.  ?Skin: ?   General: Skin is warm.  ?   Findings: No erythema or rash.  ?Neurological:  ?   Mental Status: She is alert and oriented to person, place, and time.  ?Psychiatric:  ?   Comments: Well groomed, good eye contact.  ? ?ASSESSMENT AND PLAN: ? ?Ms.Skii was seen today for ear pain. ? ?Diagnoses and all orders for this visit: ? ?Reactive airway disease without asthma ?Today auscultation is negative. ?Offered chest x-ray, she prefers to wait on this. ?Recommend albuterol 2 puff every 6 hours for 7 days and then as needed. ?Instructed about warning signs. ? ?-     albuterol (VENTOLIN HFA) 108 (90 Base) MCG/ACT inhaler; Inhale 1-2 puffs into the lungs every 6 (six) hours as needed for wheezing or shortness of  breath. ? ?Acute otitis externa of right ear, unspecified type ?Mild, this could be causing her right earache. ?Ciprodex x7 days recommended. ?Instructed about warning signs. ? ?Hearing Screening  ? '500Hz'$  '1000Hz'$  2000

## 2021-06-20 NOTE — Telephone Encounter (Signed)
Pt is aware albuterol inhaler has been e-scribe to [facility] on [address] ?

## 2021-06-20 NOTE — Patient Instructions (Addendum)
A few things to remember from today's visit: ? ?Reactive airway disease without asthma ? ?Nasal sinus congestion ? ?Earache symptoms, right ? ?If you need refills please call your pharmacy. ?Do not use My Chart to request refills or for acute issues that need immediate attention. ?  ?Antibiotic eardrops in right ear for 7 days. ?Monitor for new symptoms. ?If any fever, repeat COVID-19 test. ?Albuterol inh 2 puff every 6 hours for a week then as needed for wheezing or shortness of breath.  ? ?Please be sure medication list is accurate. ?If a new problem present, please set up appointment sooner than planned today. ? ?There are 2 forms of allergic rhinitis: ?Seasonal (hay fever): Caused by an allergy to pollen and/or mold spores in the air. Pollen is the fine powder that comes from the stamen of flowering plants. It can be carried through the air and is easily inhaled. Symptoms are seasonal and usually occur in spring, late summer, and fall. ?Perennial: Caused by other allergens such as dust mites, pet hair or dander, or mold. Symptoms occur year-round. ? ?Symptoms: ?Your symptoms can vary, depending on the severity of your allergies. Symptoms can include: Sneezing, coughing.itching (mostly eyes, nose, mouth, throat and skin),runny nose,stuffy nose.headache,pressure in the nose and cheeks,ear fullness and popping, sore throat.watery, red, or swollen eyes,dark circles under your eyes,trouble smelling, and sometimes hives. ? ?Allergic rhinitis cannot be prevented. You can help your symptoms by avoiding the things that you are allergic, including: ?Keeping windows closed. This is especially important during high-pollen seasons. ?Washing your hands after petting animals. ?Using dust- and mite-proof bedding and mattress covers. ?Wearing glasses outside to protect your eyes. ?Showering before bed to wash off allergens from hair and skin. ?You can also avoid things that can make your symptoms worse, such as: ?aerosol  sprays ?air pollution ?cold temperatures ?humidity ?irritating fumes ?tobacco smoke ?wind ?wood smoke. ? ?Antihistamines help reduce the sneezing, runny nose, and itchiness of allergies. These come in pill form and as nasal sprays. Allegra,Zyrtec,or Claritin are some examples. ?Decongestants, such as pseudoephedrine and phenylephrine, help temporarily relieve the stuffy nose of allergies. Decongestants are found in many medicines and come as pills, nose sprays, and nose drops. They could increase heart rate and cause tachycardia and tremor. Nasal Afrin should not be used for more than 3 days because you can become dependent on them. This causes you to feel even more stopped-up when you try to quit using them.  ?Nasal sprays: steroids or antihistaminics. Over the counter intranasal sterids: Nasocort,Rhinocort,or Flonase.You won?t notice their benefits for up to 2 weeks after starting them. ?Allergy shots or sublingual tablets when other treatment do not help.This is done by immunologists. ? ? ? ? ? ? ?

## 2021-06-22 ENCOUNTER — Other Ambulatory Visit: Payer: Self-pay | Admitting: Family Medicine

## 2021-06-22 DIAGNOSIS — A6000 Herpesviral infection of urogenital system, unspecified: Secondary | ICD-10-CM

## 2021-06-26 ENCOUNTER — Encounter: Payer: Self-pay | Admitting: Family Medicine

## 2021-06-26 ENCOUNTER — Ambulatory Visit (INDEPENDENT_AMBULATORY_CARE_PROVIDER_SITE_OTHER): Payer: 59 | Admitting: Family Medicine

## 2021-06-26 VITALS — BP 140/80 | HR 57 | Temp 98.5°F | Ht 69.0 in | Wt 254.0 lb

## 2021-06-26 DIAGNOSIS — S76212A Strain of adductor muscle, fascia and tendon of left thigh, initial encounter: Secondary | ICD-10-CM | POA: Diagnosis not present

## 2021-06-26 DIAGNOSIS — S7001XA Contusion of right hip, initial encounter: Secondary | ICD-10-CM | POA: Diagnosis not present

## 2021-06-26 DIAGNOSIS — S0093XA Contusion of unspecified part of head, initial encounter: Secondary | ICD-10-CM

## 2021-06-26 DIAGNOSIS — M545 Low back pain, unspecified: Secondary | ICD-10-CM

## 2021-06-26 MED ORDER — IBUPROFEN 600 MG PO TABS: 600.0000 mg | ORAL_TABLET | Freq: Four times a day (QID) | ORAL | 0 refills | Status: DC | PRN

## 2021-06-26 NOTE — Progress Notes (Signed)
? ?  Subjective:  ? ? Patient ID: Brianna Foster, female    DOB: 1955/12/27, 66 y.o.   MRN: [id number] ? ?HPI ?Here for injuries from a fall that occurred at noon today at her job. She was spraying some disinfectant around the office when she lost her balance and fell, landing on her right side. She did strike her head on the floor, but there was no LOC. Since then she has had soreness on the side of her head but no real headaches. No vision changes or dizziness. Her main complaint is pain in the lateral right hip and in the left groin area. She has pain on walking and standing up from a chair. She took some Tylenol a few hours ago with no relief.  ? ? ?Review of Systems  ?Constitutional: Negative.   ?Respiratory: Negative.    ?Cardiovascular: Negative.   ?Musculoskeletal:  Positive for arthralgias.  ?Neurological: Negative.   ? ?   ?Objective:  ? Physical Exam ?Constitutional:   ?   Comments: She has some pain with walking, but there is no unsteadiness   ?HENT:  ?   Head: Normocephalic and atraumatic.  ?Eyes:  ?   Extraocular Movements: Extraocular movements intact.  ?   Pupils: Pupils are equal, round, and reactive to light.  ?Cardiovascular:  ?   Rate and Rhythm: Normal rate and regular rhythm.  ?   Pulses: Normal pulses.  ?   Heart sounds: Normal heart sounds.  ?Pulmonary:  ?   Effort: Pulmonary effort is normal.  ?   Breath sounds: Normal breath sounds.  ?Abdominal:  ?   General: Abdomen is flat. There is no distension.  ?   Palpations: Abdomen is soft.  ?   Tenderness: There is no abdominal tenderness.  ?Musculoskeletal:  ?   Cervical back: Normal range of motion. No tenderness.  ?   Comments: She is quite tender over the right greater trochanter, but the right hip has full ROM. She is tender in the left groin but the left hip also has full ROM  ?Neurological:  ?   General: No focal deficit present.  ?   Mental Status: She is alert and oriented to person, place, and time.  ?   Cranial Nerves: No cranial nerve  deficit.  ?   Motor: No weakness.  ?   Coordination: Coordination normal.  ?   Gait: Gait normal.  ? ? ? ? ? ?   ?Assessment & Plan:  ?She has a contusion to the right hip and a left groin strain. She will go home and rest. She will apply ice to the hip every 4-6 hours. I refilled Ibuprofen 600 mg that she can use as needed. She also has a mild contusion to the head, but there are no signs of a concussion. Recheck as needed. ?Alysia Penna, MD ? ? ?

## 2021-06-29 ENCOUNTER — Other Ambulatory Visit: Payer: 59

## 2021-06-29 ENCOUNTER — Other Ambulatory Visit: Payer: 59 | Admitting: Obstetrics and Gynecology

## 2021-06-29 NOTE — Progress Notes (Deleted)
GYNECOLOGY  VISIT ?  ?HPI: ?66 y.o.   Divorced  {Race/ethnicity:17218}  female   ?G1P1001 with Patient's last menstrual period was 01/07/2010 (lmp unknown).   ?here for pelvic ultrasound. ? ?GYNECOLOGIC HISTORY: ?Patient's last menstrual period was 01/07/2010 (lmp unknown). ?Contraception:  PMP ?Menopausal hormone therapy:  *** ?Last mammogram:  03-14-21 Neg/BiRads1 ?Last pap smear: 07-20-19 Neg:Neg HR HPV,  05-08-16 Neg, 01-05-15 Neg ?       ?OB History   ? ? Gravida  ?1  ? Para  ?1  ? Term  ?1  ? Preterm  ?   ? AB  ?0  ? Living  ?1  ?  ? ? SAB  ?   ? IAB  ?   ? Ectopic  ?0  ? Multiple  ?   ? Live Births  ?   ?   ?  ?  ?    ? ?Patient Active Problem List  ? Diagnosis Date Noted  ? Morbid obesity ([place]) 01/30/2021  ? Incontinence of urine in female 01/30/2021  ? Pneumonia due to COVID-19 virus 05/10/2020  ? Hypokalemia 04/13/2020  ? Healthcare maintenance 12/24/2018  ? Rhinitis, allergic 06/23/2018  ? OSA (obstructive sleep apnea) 08/28/2017  ? Recurrent genital herpes 07/19/2017  ? Unstable angina ([place]) 07/11/2017  ? De Quervain's tenosynovitis, right 05/08/2016  ? Cervical radiculopathy 05/08/2016  ? Benign paroxysmal positional vertigo 03/20/2016  ? Eczema 03/20/2016  ? Hyperlipidemia, unspecified 03/20/2016  ? Vertigo 10/28/2015  ? Headache 10/28/2015  ? Tobacco abuse 07/23/2012  ? Gastroesophageal reflux disease 06/18/2011  ? Essential hypertension 06/18/2011  ? ? ?Past Medical History:  ?Diagnosis Date  ? Arthritis   ? COVID-19 03/2020  ? had pneumonia  ? GERD (gastroesophageal reflux disease)   ? Headache(784.0)   ? Hypertension   ? OSA (obstructive sleep apnea) 08/28/2017  ? uses a Cpap  ? Pneumonia   ? ? ?Past Surgical History:  ?Procedure Laterality Date  ? CERVICAL FUSION    ? LEFT HEART CATH AND CORONARY ANGIOGRAPHY N/A 07/11/2017  ? Procedure: LEFT HEART CATH AND CORONARY ANGIOGRAPHY;  Surgeon: Martinique, Peter M, MD;  Location: [facility];  Service: Cardiovascular;  Laterality: N/A;  ? LEFT HEART  CATHETERIZATION WITH CORONARY ANGIOGRAM N/A 05/11/2013  ? Procedure: LEFT HEART CATHETERIZATION WITH CORONARY ANGIOGRAM;  Surgeon: Burnell Blanks, MD;  Location: [facility] CATH [facility];  Service: Cardiovascular;  Laterality: N/A;  ? neck fusion    ? RADIOLOGY WITH ANESTHESIA Right 02/28/2021  ? Procedure: MRI OF THE ELBOW WITHOUT CONTRAST WITH ANESTHESIA;  Surgeon: Radiologist, Medication, MD;  Location: [facility];  Service: Radiology;  Laterality: Right;  ? ? ?Current Outpatient Medications  ?Medication Sig Dispense Refill  ? albuterol (VENTOLIN HFA) 108 (90 Base) MCG/ACT inhaler Inhale 1-2 puffs into the lungs every 6 (six) hours as needed for wheezing or shortness of breath. 6.7 g 0  ? amLODipine (NORVASC) 5 MG tablet TAKE 1 TABLET (5 MG TOTAL) BY MOUTH DAILY. 90 tablet 2  ? Ascorbic Acid (VITAMIN C) 1000 MG tablet Take 1,000 mg by mouth daily.    ? atorvastatin (LIPITOR) 20 MG tablet Take 1 tablet (20 mg total) by mouth daily. 90 tablet 3  ? benzonatate (TESSALON) 100 MG capsule Take 1 capsule (100 mg total) by mouth 2 (two) times daily as needed for up to 10 days. 20 capsule 0  ? cholecalciferol (VITAMIN D3) 25 MCG (1000 UNIT) tablet Take 1,000 Units by mouth daily.    ?  clobetasol ointment (TEMOVATE) 0.62 % Apply 1 application topically 2 (two) times a week. Initial dose: twice daily x 14 days, then decrease to daily x 14 days, then every other day x 14 days, then twice weekly for duration of use 45 g 1  ? fluticasone (FLONASE) 50 MCG/ACT nasal spray Place 1 spray into both nostrils 2 (two) times daily as needed for allergies or rhinitis. 16 g 5  ? gabapentin (NEURONTIN) 300 MG capsule Take 1 capsule (300 mg total) by mouth 3 (three) times daily as needed. 90 capsule 3  ? hydrochlorothiazide (HYDRODIURIL) 25 MG tablet TAKE 1 TABLET (25 MG TOTAL) BY MOUTH DAILY. 90 tablet 2  ? ibuprofen (ADVIL) 600 MG tablet Take 1 tablet (600 mg total) by mouth every 6 (six) hours as needed for moderate pain. 60 tablet 0  ? meclizine  (ANTIVERT) 25 MG tablet Take 1 tablet (25 mg total) by mouth daily as needed for dizziness. 30 tablet 2  ? metoprolol tartrate (LOPRESSOR) 25 MG tablet TAKE 0.5 TABLETS BY MOUTH 2 TIMES DAILY. 90 tablet 2  ? nitroGLYCERIN (NITRODUR - DOSED IN MG/24 HR) 0.2 mg/hr patch PLACE 1/4 TO 1/2 OF A PATCH OVER AFFECTED REGION. REMOVE AND REPLACE ONCE DAILY. SLIGHTLY ALTER SKIN PLACEMENT DAILY 30 patch 1  ? pantoprazole (PROTONIX) 40 MG tablet TAKE 1 TABLET BY MOUTH TWICE A DAY 180 tablet 0  ? Spacer/Aero-Holding Chambers (AEROCHAMBER PLUS) inhaler Use as instructed to use with inahaler. 1 each 1  ? sucralfate (CARAFATE) 1 g tablet Take 1 tablet (1 g total) by mouth every 6 (six) hours as needed. Slowly dissolve 1 tablet in 1 Tablespoon of distilled water before ingesting 60 tablet 3  ? triamcinolone cream (KENALOG) 0.1 % APPLY 1 APPLICATION TOPICALLY 2 (TWO) TIMES DAILY AS NEEDED. 45 g 1  ? valACYclovir (VALTREX) 500 MG tablet 1 TAB TWICE DAILY FOR 3 DAYS WITH OUTBREAKS, START WITHIN 48 HOURS AFTER ONSET. 18 tablet 1  ? ?No current facility-administered medications for this visit.  ?  ? ?ALLERGIES: Other ? ?Family History  ?Problem Relation Age of Onset  ? Lung cancer Mother   ? Stomach cancer Father   ? Heart disease Brother   ?     Pacemaker  ? Esophageal cancer Brother   ? Breast cancer Sister   ? Colon cancer Neg Hx   ? Pancreatic cancer Neg Hx   ? Liver disease Neg Hx   ? Rectal cancer Neg Hx   ? ? ?Social History  ? ?Socioeconomic History  ? Marital status: Divorced  ?  Spouse name: Not on file  ? Number of children: 1  ? Years of education: College  ? Highest education level: Not on file  ?Occupational History  ?  Comment: [employer]  ?Tobacco Use  ? Smoking status: Former  ?  Packs/day: 0.25  ?  Years: 16.00  ?  Pack years: 4.00  ?  Types: Cigarettes  ?  Quit date: 03/09/2018  ?  Years since quitting: 3.3  ? Smokeless tobacco: Never  ?Vaping Use  ? Vaping Use: Never used  ?Substance and Sexual Activity  ?  Alcohol use: Yes  ?  Comment: socially  ? Drug use: No  ? Sexual activity: Not Currently  ?  Birth control/protection: Post-menopausal  ?Other Topics Concern  ? Not on file  ?Social History Narrative  ? Lives at home alone  ? Right-handed  ? Drinks decaf coffee  ? ?Social Determinants of Health  ? ?  Financial Resource Strain: Not on file  ?Food Insecurity: Not on file  ?Transportation Needs: Not on file  ?Physical Activity: Not on file  ?Stress: Not on file  ?Social Connections: Not on file  ?Intimate Partner Violence: Not on file  ? ? ?Review of Systems ? ?PHYSICAL EXAMINATION:   ? ?LMP 01/07/2010 (LMP Unknown)     ?General appearance: alert, cooperative and appears stated age ?Head: Normocephalic, without obvious abnormality, atraumatic ?Neck: no adenopathy, supple, symmetrical, trachea midline and thyroid normal to inspection and palpation ?Lungs: clear to auscultation bilaterally ?Breasts: normal appearance, no masses or tenderness, No nipple retraction or dimpling, No nipple discharge or bleeding, No axillary or supraclavicular adenopathy ?Heart: regular rate and rhythm ?Abdomen: soft, non-tender, no masses,  no organomegaly ?Extremities: extremities normal, atraumatic, no cyanosis or edema ?Skin: Skin color, texture, turgor normal. No rashes or lesions ?Lymph nodes: Cervical, supraclavicular, and axillary nodes normal. ?No abnormal inguinal nodes palpated ?Neurologic: Grossly normal ? ?Pelvic: External genitalia:  no lesions ?             Urethra:  normal appearing urethra with no masses, tenderness or lesions ?             Bartholins and Skenes: normal    ?             Vagina: normal appearing vagina with normal color and discharge, no lesions ?             Cervix: no lesions ?               ?Bimanual Exam:  Uterus:  normal size, contour, position, consistency, mobility, non-tender ?             Adnexa: no mass, fullness, tenderness ?             Rectal exam: {yes no:314532}.  Confirms. ?             Anus:   normal sphincter tone, no lesions ? ?Chaperone was present for exam:  *** ? ?ASSESSMENT ? ?  ? ?PLAN ? ? ?  ?An After Visit Summary was printed and given to the patient. ? ?______ minutes face to face time of whi

## 2021-07-06 DIAGNOSIS — G4733 Obstructive sleep apnea (adult) (pediatric): Secondary | ICD-10-CM | POA: Diagnosis not present

## 2021-07-18 ENCOUNTER — Other Ambulatory Visit: Payer: Self-pay | Admitting: Family Medicine

## 2021-07-18 DIAGNOSIS — J989 Respiratory disorder, unspecified: Secondary | ICD-10-CM

## 2021-07-31 NOTE — Progress Notes (Deleted)
GYNECOLOGY  VISIT   HPI: 66 y.o.   Divorced  {Race/ethnicity:17218}  female   Brianna Foster with Patient's last menstrual period was 01/07/2010 (lmp unknown).   here for pelvic ultrasound.    GYNECOLOGIC HISTORY: Patient's last menstrual period was 01/07/2010 (lmp unknown). Contraception:  PMP Menopausal hormone therapy:  none Last mammogram:  03-14-21 Neg/BiRads1 Last pap smear:  07-20-19 Neg, 05-08-16 Neg, 01-05-15 Neg        OB History     Gravida  1   Para  1   Term  1   Preterm      AB  0   Living  1      SAB      IAB      Ectopic  0   Multiple      Live Births                 Patient Active Problem List   Diagnosis Date Noted   Morbid obesity ([place]) 01/30/2021   Incontinence of urine in female 01/30/2021   Pneumonia due to COVID-19 virus 05/10/2020   Hypokalemia 04/13/2020   Healthcare maintenance 12/24/2018   Rhinitis, allergic 06/23/2018   OSA (obstructive sleep apnea) 08/28/2017   Recurrent genital herpes 07/19/2017   Unstable angina ([place]) 07/11/2017   De Quervain's tenosynovitis, right 05/08/2016   Cervical radiculopathy 05/08/2016   Benign paroxysmal positional vertigo 03/20/2016   Eczema 03/20/2016   Hyperlipidemia, unspecified 03/20/2016   Vertigo 10/28/2015   Headache 10/28/2015   Tobacco abuse 07/23/2012   Gastroesophageal reflux disease 06/18/2011   Essential hypertension 06/18/2011    Past Medical History:  Diagnosis Date   Arthritis    COVID-19 03/2020   had pneumonia   GERD (gastroesophageal reflux disease)    Headache(784.0)    Hypertension    OSA (obstructive sleep apnea) 08/28/2017   uses a Cpap   Pneumonia     Past Surgical History:  Procedure Laterality Date   CERVICAL FUSION     LEFT HEART CATH AND CORONARY ANGIOGRAPHY N/A 07/11/2017   Procedure: LEFT HEART CATH AND CORONARY ANGIOGRAPHY;  Surgeon: Martinique, Peter M, MD;  Location: [facility];  Service: Cardiovascular;  Laterality: N/A;   LEFT HEART  CATHETERIZATION WITH CORONARY ANGIOGRAM N/A 05/11/2013   Procedure: LEFT HEART CATHETERIZATION WITH CORONARY ANGIOGRAM;  Surgeon: Burnell Blanks, MD;  Location: [facility] CATH LAB;  Service: Cardiovascular;  Laterality: N/A;   neck fusion     RADIOLOGY WITH ANESTHESIA Right 02/28/2021   Procedure: MRI OF THE ELBOW WITHOUT CONTRAST WITH ANESTHESIA;  Surgeon: Radiologist, Medication, MD;  Location: [place];  Service: Radiology;  Laterality: Right;    Current Outpatient Medications  Medication Sig Dispense Refill   albuterol (VENTOLIN HFA) 108 (90 Base) MCG/ACT inhaler INHALE 1-2 PUFFS BY MOUTH EVERY 6 HOURS AS NEEDED FOR WHEEZE OR SHORTNESS OF BREATH 8.5 each 1   amLODipine (NORVASC) 5 MG tablet TAKE 1 TABLET (5 MG TOTAL) BY MOUTH DAILY. 90 tablet 2   Ascorbic Acid (VITAMIN C) 1000 MG tablet Take 1,000 mg by mouth daily.     atorvastatin (LIPITOR) 20 MG tablet Take 1 tablet (20 mg total) by mouth daily. 90 tablet 3   cholecalciferol (VITAMIN D3) 25 MCG (1000 UNIT) tablet Take 1,000 Units by mouth daily.     clobetasol ointment (TEMOVATE) 7.82 % Apply 1 application topically 2 (two) times a week. Initial dose: twice daily x 14 days, then decrease to daily x 14 days, then every other  day x 14 days, then twice weekly for duration of use 45 g 1   fluticasone (FLONASE) 50 MCG/ACT nasal spray Place 1 spray into both nostrils 2 (two) times daily as needed for allergies or rhinitis. 16 g 5   gabapentin (NEURONTIN) 300 MG capsule Take 1 capsule (300 mg total) by mouth 3 (three) times daily as needed. 90 capsule 3   hydrochlorothiazide (HYDRODIURIL) 25 MG tablet TAKE 1 TABLET (25 MG TOTAL) BY MOUTH DAILY. 90 tablet 2   ibuprofen (ADVIL) 600 MG tablet Take 1 tablet (600 mg total) by mouth every 6 (six) hours as needed for moderate pain. 60 tablet 0   meclizine (ANTIVERT) 25 MG tablet Take 1 tablet (25 mg total) by mouth daily as needed for dizziness. 30 tablet 2   metoprolol tartrate (LOPRESSOR) 25 MG tablet  TAKE 0.5 TABLETS BY MOUTH 2 TIMES DAILY. 90 tablet 2   nitroGLYCERIN (NITRODUR - DOSED IN MG/24 HR) 0.2 mg/hr patch PLACE 1/4 TO 1/2 OF A PATCH OVER AFFECTED REGION. REMOVE AND REPLACE ONCE DAILY. SLIGHTLY ALTER SKIN PLACEMENT DAILY 30 patch 1   pantoprazole (PROTONIX) 40 MG tablet TAKE 1 TABLET BY MOUTH TWICE A DAY 180 tablet 0   Spacer/Aero-Holding Chambers (AEROCHAMBER PLUS) inhaler Use as instructed to use with inahaler. 1 each 1   sucralfate (CARAFATE) 1 g tablet Take 1 tablet (1 g total) by mouth every 6 (six) hours as needed. Slowly dissolve 1 tablet in 1 Tablespoon of distilled water before ingesting 60 tablet 3   triamcinolone cream (KENALOG) 0.1 % APPLY 1 APPLICATION TOPICALLY 2 (TWO) TIMES DAILY AS NEEDED. 45 g 1   valACYclovir (VALTREX) 500 MG tablet 1 TAB TWICE DAILY FOR 3 DAYS WITH OUTBREAKS, START WITHIN 48 HOURS AFTER ONSET. 18 tablet 1   No current facility-administered medications for this visit.     ALLERGIES: Other  Family History  Problem Relation Age of Onset   Lung cancer Mother    Stomach cancer Father    Heart disease Brother        Pacemaker   Esophageal cancer Brother    Breast cancer Sister    Colon cancer Neg Hx    Pancreatic cancer Neg Hx    Liver disease Neg Hx    Rectal cancer Neg Hx     Social History   Socioeconomic History   Marital status: Divorced    Spouse name: Not on file   Number of children: 1   Years of education: College   Highest education level: Not on file  Occupational History    Comment: Glass blower/designer  Tobacco Use   Smoking status: Former    Packs/day: 0.25    Years: 16.00    Pack years: 4.00    Types: Cigarettes    Quit date: 03/09/2018    Years since quitting: 3.3   Smokeless tobacco: Never  Vaping Use   Vaping Use: Never used  Substance and Sexual Activity   Alcohol use: Yes    Comment: socially   Drug use: No   Sexual activity: Not Currently    Birth control/protection: Post-menopausal  Other Topics Concern    Not on file  Social History Narrative   Lives at home alone   Right-handed   Drinks decaf coffee   Social Determinants of Health   Financial Resource Strain: Not on file  Food Insecurity: Not on file  Transportation Needs: Not on file  Physical Activity: Not on file  Stress: Not on file  Social  Connections: Not on file  Intimate Partner Violence: Not on file    Review of Systems  PHYSICAL EXAMINATION:    LMP 01/07/2010 (LMP Unknown)     General appearance: alert, cooperative and appears stated age Head: Normocephalic, without obvious abnormality, atraumatic Neck: no adenopathy, supple, symmetrical, trachea midline and thyroid normal to inspection and palpation Lungs: clear to auscultation bilaterally Breasts: normal appearance, no masses or tenderness, No nipple retraction or dimpling, No nipple discharge or bleeding, No axillary or supraclavicular adenopathy Heart: regular rate and rhythm Abdomen: soft, non-tender, no masses,  no organomegaly Extremities: extremities normal, atraumatic, no cyanosis or edema Skin: Skin color, texture, turgor normal. No rashes or lesions Lymph nodes: Cervical, supraclavicular, and axillary nodes normal. No abnormal inguinal nodes palpated Neurologic: Grossly normal  Pelvic: External genitalia:  no lesions              Urethra:  normal appearing urethra with no masses, tenderness or lesions              Bartholins and Skenes: normal                 Vagina: normal appearing vagina with normal color and discharge, no lesions              Cervix: no lesions                Bimanual Exam:  Uterus:  normal size, contour, position, consistency, mobility, non-tender              Adnexa: no mass, fullness, tenderness              Rectal exam: {yes no:314532}.  Confirms.              Anus:  normal sphincter tone, no lesions  Chaperone was present for exam:  ***  ASSESSMENT     PLAN     An After Visit Summary was printed and given to the  patient.  ______ minutes face to face time of which over 50% was spent in counseling.

## 2021-08-01 ENCOUNTER — Other Ambulatory Visit: Payer: 59

## 2021-08-01 ENCOUNTER — Other Ambulatory Visit: Payer: 59 | Admitting: Obstetrics and Gynecology

## 2021-08-01 DIAGNOSIS — Z0289 Encounter for other administrative examinations: Secondary | ICD-10-CM

## 2021-08-05 ENCOUNTER — Other Ambulatory Visit: Payer: Self-pay | Admitting: Family Medicine

## 2021-08-05 DIAGNOSIS — M545 Low back pain, unspecified: Secondary | ICD-10-CM

## 2021-08-05 DIAGNOSIS — G4733 Obstructive sleep apnea (adult) (pediatric): Secondary | ICD-10-CM | POA: Diagnosis not present

## 2021-08-05 DIAGNOSIS — H8110 Benign paroxysmal vertigo, unspecified ear: Secondary | ICD-10-CM

## 2021-08-06 DIAGNOSIS — G4733 Obstructive sleep apnea (adult) (pediatric): Secondary | ICD-10-CM | POA: Diagnosis not present

## 2021-08-07 ENCOUNTER — Telehealth: Payer: Self-pay

## 2021-08-07 NOTE — Telephone Encounter (Signed)
PA submitted via CoverMyMeds for pantoprazole 40 mg bid.  Approved: KeyClarene Duke - PA Case ID: [id number] ?

## 2021-08-16 ENCOUNTER — Other Ambulatory Visit: Payer: Self-pay | Admitting: Family Medicine

## 2021-09-09 DIAGNOSIS — R519 Headache, unspecified: Secondary | ICD-10-CM | POA: Diagnosis not present

## 2021-09-09 DIAGNOSIS — W182XXA Fall in (into) shower or empty bathtub, initial encounter: Secondary | ICD-10-CM | POA: Diagnosis not present

## 2021-09-09 DIAGNOSIS — I10 Essential (primary) hypertension: Secondary | ICD-10-CM | POA: Diagnosis not present

## 2021-09-09 DIAGNOSIS — K219 Gastro-esophageal reflux disease without esophagitis: Secondary | ICD-10-CM | POA: Diagnosis not present

## 2021-09-09 DIAGNOSIS — S20229A Contusion of unspecified back wall of thorax, initial encounter: Secondary | ICD-10-CM | POA: Diagnosis not present

## 2021-09-09 DIAGNOSIS — S3992XA Unspecified injury of lower back, initial encounter: Secondary | ICD-10-CM | POA: Diagnosis not present

## 2021-09-09 DIAGNOSIS — S99922A Unspecified injury of left foot, initial encounter: Secondary | ICD-10-CM | POA: Diagnosis not present

## 2021-09-09 DIAGNOSIS — Z981 Arthrodesis status: Secondary | ICD-10-CM | POA: Diagnosis not present

## 2021-09-09 DIAGNOSIS — M79675 Pain in left toe(s): Secondary | ICD-10-CM | POA: Diagnosis not present

## 2021-09-09 DIAGNOSIS — M545 Low back pain, unspecified: Secondary | ICD-10-CM | POA: Diagnosis not present

## 2021-09-12 NOTE — Progress Notes (Signed)
HPI: Brianna Foster is a 66 y.o. female, who is here today to follow on recent ED visit due to fall on 09/08/21. Evaluated in the ED on 09/09/21 . She was in a [place] bathroom trying to get into her bathtub, slipped and fell. Landed on her back and hit head against bathtub. Started with headache and back pain later same day, worse next day. Her granddaughter helped her to get up.  BP in the ED was elevated at 163/88. She is not checking BP at home. Negative for chest pain, dyspnea, palpitation,focal weakness, or edema. She is on Metoprolol Tartrate 25 mg bid,Amlodipine 5 mg daily, and HCTZ 25 mg daily.  Lab Results  Component Value Date   CREATININE 0.92 01/30/2021   BUN 14 01/30/2021   NA 138 01/30/2021   K 3.5 01/30/2021   CL 103 01/30/2021   CO2 26 01/30/2021   Lumbar X ray 09/09/21 negative for acute process. Left foot X ray negative for fracture or dislocation.  Still having headache, fronto-occipital achy headache, constant, 5/10. No associated nausea,vomiting,photophobia,or focal weakness. + Phonophobia. "Little" neck pain.  Back pain exacerbated by movement and alleviated by rest. She has taken Tylenol. Headache and back pain are not getting worse.  Review of Systems  Constitutional:  Positive for activity change and fatigue. Negative for appetite change.  HENT:  Negative for sore throat.   Respiratory:  Negative for cough and wheezing.   Gastrointestinal:  Negative for abdominal pain.       No changes in bowel habits.  Genitourinary:  Negative for decreased urine volume, dysuria and hematuria.  Musculoskeletal:  Negative for gait problem.  Neurological:  Negative for syncope and facial asymmetry.  Rest see pertinent positives and negatives per HPI.  Current Outpatient Medications on File Prior to Visit  Medication Sig Dispense Refill   albuterol (VENTOLIN HFA) 108 (90 Base) MCG/ACT inhaler INHALE 1-2 PUFFS BY MOUTH EVERY 6 HOURS AS NEEDED FOR WHEEZE OR  SHORTNESS OF BREATH 8.5 each 1   amLODipine (NORVASC) 5 MG tablet TAKE 1 TABLET (5 MG TOTAL) BY MOUTH DAILY. 90 tablet 2   Ascorbic Acid (VITAMIN C) 1000 MG tablet Take 1,000 mg by mouth daily.     atorvastatin (LIPITOR) 20 MG tablet Take 1 tablet (20 mg total) by mouth daily. 90 tablet 3   cholecalciferol (VITAMIN D3) 25 MCG (1000 UNIT) tablet Take 1,000 Units by mouth daily.     clobetasol ointment (TEMOVATE) 9.50 % Apply 1 application topically 2 (two) times a week. Initial dose: twice daily x 14 days, then decrease to daily x 14 days, then every other day x 14 days, then twice weekly for duration of use 45 g 1   fluticasone (FLONASE) 50 MCG/ACT nasal spray Place 1 spray into both nostrils 2 (two) times daily as needed for allergies or rhinitis. 16 g 5   gabapentin (NEURONTIN) 300 MG capsule Take 1 capsule (300 mg total) by mouth 3 (three) times daily as needed. 90 capsule 3   hydrochlorothiazide (HYDRODIURIL) 25 MG tablet TAKE 1 TABLET (25 MG TOTAL) BY MOUTH DAILY. 90 tablet 2   ibuprofen (ADVIL) 600 MG tablet Take 1 tablet (600 mg total) by mouth every 6 (six) hours as needed for moderate pain. 60 tablet 0   meclizine (ANTIVERT) 25 MG tablet TAKE 1 TABLET (25 MG TOTAL) BY MOUTH DAILY AS NEEDED FOR DIZZINESS. 30 tablet 2   metoprolol tartrate (LOPRESSOR) 25 MG tablet TAKE 0.5 TABLETS BY MOUTH 2  TIMES DAILY. 90 tablet 2   nitroGLYCERIN (NITRODUR - DOSED IN MG/24 HR) 0.2 mg/hr patch PLACE 1/4 TO 1/2 OF A PATCH OVER AFFECTED REGION. REMOVE AND REPLACE ONCE DAILY. SLIGHTLY ALTER SKIN PLACEMENT DAILY 30 patch 1   pantoprazole (PROTONIX) 40 MG tablet TAKE 1 TABLET BY MOUTH TWICE A DAY 180 tablet 0   Spacer/Aero-Holding Chambers (AEROCHAMBER PLUS) inhaler Use as instructed to use with inahaler. 1 each 1   sucralfate (CARAFATE) 1 g tablet Take 1 tablet (1 g total) by mouth every 6 (six) hours as needed. Slowly dissolve 1 tablet in 1 Tablespoon of distilled water before ingesting 60 tablet 3    triamcinolone cream (KENALOG) 0.1 % APPLY 1 APPLICATION TOPICALLY 2 (TWO) TIMES DAILY AS NEEDED. 45 g 1   valACYclovir (VALTREX) 500 MG tablet 1 TAB TWICE DAILY FOR 3 DAYS WITH OUTBREAKS, START WITHIN 48 HOURS AFTER ONSET. 18 tablet 1   No current facility-administered medications on file prior to visit.   Past Medical History:  Diagnosis Date   Arthritis    COVID-19 03/2020   had pneumonia   GERD (gastroesophageal reflux disease)    Headache(784.0)    Hypertension    OSA (obstructive sleep apnea) 08/28/2017   uses a Cpap   Pneumonia    Allergies  Allergen Reactions   Other Anaphylaxis, Shortness Of Breath and Swelling    [place] NUTS   Social History   Socioeconomic History   Marital status: Divorced    Spouse name: Not on file   Number of children: 1   Years of education: College   Highest education level: Not on file  Occupational History    Comment: Glass blower/designer  Tobacco Use   Smoking status: Former    Packs/day: 0.25    Years: 16.00    Total pack years: 4.00    Types: Cigarettes    Quit date: 03/09/2018    Years since quitting: 3.5   Smokeless tobacco: Never  Vaping Use   Vaping Use: Never used  Substance and Sexual Activity   Alcohol use: Yes    Comment: socially   Drug use: No   Sexual activity: Not Currently    Birth control/protection: Post-menopausal  Other Topics Concern   Not on file  Social History Narrative   Lives at home alone   Right-handed   Drinks decaf coffee   Social Determinants of Health   Financial Resource Strain: Not on file  Food Insecurity: Not on file  Transportation Needs: Not on file  Physical Activity: Not on file  Stress: Not on file  Social Connections: Not on file   Vitals:   09/13/21 1054  BP: 128/80  Pulse: (!) 56  Resp: 16  SpO2: 98%   Body mass index is 37.42 kg/m.  Physical Exam Vitals and nursing note reviewed.  Constitutional:      General: She is not in acute distress.    Appearance: She is  well-developed.  HENT:     Head: Normocephalic and atraumatic.     Mouth/Throat:     Mouth: Mucous membranes are moist.     Pharynx: Oropharynx is clear.  Eyes:     Extraocular Movements: Extraocular movements intact.     Conjunctiva/sclera: Conjunctivae normal.     Pupils: Pupils are equal, round, and reactive to light.  Cardiovascular:     Rate and Rhythm: Regular rhythm. Bradycardia present.     Pulses:          Dorsalis pedis pulses are  2+ on the right side and 2+ on the left side.     Heart sounds: No murmur heard. Pulmonary:     Effort: Pulmonary effort is normal. No respiratory distress.     Breath sounds: Normal breath sounds.  Abdominal:     Palpations: Abdomen is soft. There is no mass.     Tenderness: There is no abdominal tenderness.  Musculoskeletal:     Thoracic back: Tenderness present. No bony tenderness.     Lumbar back: Tenderness present. No bony tenderness. Negative right straight leg raise test and negative left straight leg raise test.     Right lower leg: No edema.     Left lower leg: No edema.  Lymphadenopathy:     Cervical: No cervical adenopathy.  Skin:    General: Skin is warm.     Findings: No erythema or rash.  Neurological:     General: No focal deficit present.     Mental Status: She is alert and oriented to person, place, and time.     Cranial Nerves: No cranial nerve deficit.     Motor: No pronator drift.     Deep Tendon Reflexes:     Reflex Scores:      Bicep reflexes are 2+ on the right side and 2+ on the left side.      Patellar reflexes are 2+ on the right side and 2+ on the left side.    Comments: Mildly antalgic gait, not assisted.  Psychiatric:     Comments: Well groomed, good eye contact.   ASSESSMENT AND PLAN:  Brianna Foster was seen today for follow-up.  Diagnoses and all orders for this visit:  Post-traumatic headache, not intractable, unspecified chronicity pattern It has been stable. Examination today and hx do not suggest  a serious process. She agrees on holding on head CT for now. Avoid frequent Tylenol use. Instructed about warning signs.  Acute bilateral back pain, unspecified back location She has taken Celebrex before and has been well tolerated, recommend taking 100 mg bid x 7-10 days. Methocarbamol 500 mg bid prn. Side effects of medications discussed. PT can be considered of pain is persistent.  -     celecoxib (CELEBREX) 100 MG capsule; Take 1 capsule (100 mg total) by mouth 2 (two) times daily for 10 days. -     methocarbamol (ROBAXIN) 500 MG tablet; Take 1 tablet (500 mg total) by mouth every 12 (twelve) hours as needed for up to 10 days for muscle spasms.  Fall, subsequent encounter Fall precautions discussed.  Essential hypertension BP today adequately controlled. Mild bradycardia. We discussed some side effects of BB. No changes in Metoprolol tartrate, amlodipine,and HCTZ dose. Monitor BP at home.  Return if symptoms worsen or fail to improve, for Keep next f/u appt.  Brianna Barwick G. Martinique, MD  [facility]. [place] office.

## 2021-09-13 ENCOUNTER — Encounter: Payer: Self-pay | Admitting: Family Medicine

## 2021-09-13 ENCOUNTER — Ambulatory Visit (INDEPENDENT_AMBULATORY_CARE_PROVIDER_SITE_OTHER): Payer: 59 | Admitting: Family Medicine

## 2021-09-13 VITALS — BP 128/80 | HR 56 | Resp 16 | Ht 69.0 in | Wt 253.4 lb

## 2021-09-13 DIAGNOSIS — I10 Essential (primary) hypertension: Secondary | ICD-10-CM | POA: Diagnosis not present

## 2021-09-13 DIAGNOSIS — G44309 Post-traumatic headache, unspecified, not intractable: Secondary | ICD-10-CM | POA: Diagnosis not present

## 2021-09-13 DIAGNOSIS — M549 Dorsalgia, unspecified: Secondary | ICD-10-CM

## 2021-09-13 DIAGNOSIS — W19XXXD Unspecified fall, subsequent encounter: Secondary | ICD-10-CM | POA: Diagnosis not present

## 2021-09-13 MED ORDER — METHOCARBAMOL 500 MG PO TABS: 500.0000 mg | ORAL_TABLET | Freq: Two times a day (BID) | ORAL | 0 refills | Status: AC | PRN

## 2021-09-13 MED ORDER — CELECOXIB 100 MG PO CAPS: 100.0000 mg | ORAL_CAPSULE | Freq: Two times a day (BID) | ORAL | 0 refills | Status: AC

## 2021-09-13 NOTE — Patient Instructions (Addendum)
A few things to remember from today's visit:   Post-traumatic headache, not intractable, unspecified chronicity pattern  Acute bilateral back pain, unspecified back location - Plan: celecoxib (CELEBREX) 100 MG capsule, methocarbamol (ROBAXIN) 500 MG tablet  Fall, subsequent encounter  Essential hypertension  If you need refills please call your pharmacy. Do not use My Chart to request refills or for acute issues that need immediate attention.   Daily activities as tolerated. Monitor for new symptoms. If headache gets worse you need to seek immediate medical attention. It may take a few weeks for pain/achy body to resolved.  Please be sure medication list is accurate. If a new problem present, please set up appointment sooner than planned today.

## 2021-09-26 ENCOUNTER — Telehealth: Payer: Self-pay | Admitting: Family Medicine

## 2021-09-26 DIAGNOSIS — G44309 Post-traumatic headache, unspecified, not intractable: Secondary | ICD-10-CM

## 2021-09-26 NOTE — Telephone Encounter (Signed)
fell, hit head still has headache, wants to speak with provider about a CT scan

## 2021-09-26 NOTE — Telephone Encounter (Signed)
Patient is still having headache, okay to proceed with CT scan?

## 2021-09-28 NOTE — Telephone Encounter (Signed)
Pt calling back to check to see if she should go forward, still has headache

## 2021-09-29 NOTE — Telephone Encounter (Signed)
Is the headache getting worse? Any new associated symptom? If she wants to have head CT done due to post traumatic headache it is ok to place order. Thanks, BJ

## 2021-10-03 ENCOUNTER — Encounter: Payer: 59 | Admitting: Family Medicine

## 2021-10-04 DIAGNOSIS — G4733 Obstructive sleep apnea (adult) (pediatric): Secondary | ICD-10-CM | POA: Diagnosis not present

## 2021-10-09 ENCOUNTER — Ambulatory Visit
Admission: RE | Admit: 2021-10-09 | Discharge: 2021-10-09 | Disposition: A | Discharge status: Home / Self Care | Payer: 59 | Source: Ambulatory Visit | Attending: Family Medicine | Admitting: Family Medicine

## 2021-10-09 DIAGNOSIS — R519 Headache, unspecified: Secondary | ICD-10-CM | POA: Diagnosis not present

## 2021-10-09 DIAGNOSIS — G44309 Post-traumatic headache, unspecified, not intractable: Secondary | ICD-10-CM

## 2021-10-12 ENCOUNTER — Other Ambulatory Visit: Payer: Self-pay

## 2021-10-12 ENCOUNTER — Emergency Department (HOSPITAL_BASED_OUTPATIENT_CLINIC_OR_DEPARTMENT_OTHER)
Admission: EM | Admit: 2021-10-12 | Discharge: 2021-10-12 | Disposition: A | Discharge status: Home / Self Care | Payer: 59 | Attending: Emergency Medicine | Admitting: Emergency Medicine

## 2021-10-12 ENCOUNTER — Emergency Department (HOSPITAL_BASED_OUTPATIENT_CLINIC_OR_DEPARTMENT_OTHER): Payer: 59

## 2021-10-12 ENCOUNTER — Encounter (HOSPITAL_BASED_OUTPATIENT_CLINIC_OR_DEPARTMENT_OTHER): Payer: Self-pay | Admitting: Emergency Medicine

## 2021-10-12 DIAGNOSIS — R0789 Other chest pain: Secondary | ICD-10-CM | POA: Insufficient documentation

## 2021-10-12 DIAGNOSIS — I10 Essential (primary) hypertension: Secondary | ICD-10-CM | POA: Diagnosis not present

## 2021-10-12 DIAGNOSIS — R0602 Shortness of breath: Secondary | ICD-10-CM | POA: Insufficient documentation

## 2021-10-12 DIAGNOSIS — F17200 Nicotine dependence, unspecified, uncomplicated: Secondary | ICD-10-CM | POA: Diagnosis not present

## 2021-10-12 DIAGNOSIS — R69 Illness, unspecified: Secondary | ICD-10-CM | POA: Diagnosis not present

## 2021-10-12 DIAGNOSIS — Z79899 Other long term (current) drug therapy: Secondary | ICD-10-CM | POA: Diagnosis not present

## 2021-10-12 DIAGNOSIS — R079 Chest pain, unspecified: Secondary | ICD-10-CM | POA: Diagnosis not present

## 2021-10-12 LAB — CBC
HCT: 41.2 % (ref 36.0–46.0)
Hemoglobin: 14.5 g/dL (ref 12.0–15.0)
MCH: 31.3 pg (ref 26.0–34.0)
MCHC: 35.2 g/dL (ref 30.0–36.0)
MCV: 88.8 fL (ref 80.0–100.0)
Platelets: 277 K/uL (ref 150–400)
RBC: 4.64 MIL/uL (ref 3.87–5.11)
RDW: 12.3 % (ref 11.5–15.5)
WBC: 6.8 K/uL (ref 4.0–10.5)
nRBC: 0 % (ref 0.0–0.2)

## 2021-10-12 LAB — TROPONIN I (HIGH SENSITIVITY)
Troponin I (High Sensitivity): 4 ng/L (ref <18)
Troponin I (High Sensitivity): 4 ng/L (ref <18)

## 2021-10-12 LAB — BASIC METABOLIC PANEL
Anion gap: 10 (ref 5–15)
BUN: 14 mg/dL (ref 8–23)
CO2: 26 mmol/L (ref 22–32)
Calcium: 9.8 mg/dL (ref 8.9–10.3)
Chloride: 101 mmol/L (ref 98–111)
Creatinine, Ser: 0.85 mg/dL (ref 0.44–1.00)
GFR, Estimated: >60 mL/min (ref >60)
Glucose, Bld: 109 mg/dL — ABNORMAL HIGH (ref 70–99)
Potassium: 4 mmol/L (ref 3.5–5.1)
Sodium: 137 mmol/L (ref 135–145)

## 2021-10-12 LAB — D-DIMER, QUANTITATIVE: D-Dimer, Quant: 0.47 ug/mL-FEU (ref 0.00–0.50)

## 2021-10-12 MED ORDER — ASPIRIN 81 MG PO CHEW
324.0000 mg | CHEWABLE_TABLET | Freq: Once | ORAL | Status: AC
  Administered 2021-10-12: 324 mg via ORAL
  Filled 2021-10-12: qty 4

## 2021-10-12 MED ORDER — ALBUTEROL SULFATE HFA 108 (90 BASE) MCG/ACT IN AERS
2.0000 | INHALATION_SPRAY | Freq: Once | RESPIRATORY_TRACT | Status: AC
  Administered 2021-10-12: 2 via RESPIRATORY_TRACT
  Filled 2021-10-12: qty 6.7

## 2021-10-12 MED ORDER — NITROGLYCERIN 0.4 MG SL SUBL
0.4000 mg | SUBLINGUAL_TABLET | SUBLINGUAL | Status: DC | PRN
  Administered 2021-10-12: 0.4 mg via SUBLINGUAL
  Filled 2021-10-12: qty 1

## 2021-10-12 NOTE — ED Notes (Signed)
12 lead ECG to ED MD for evaluation, will obtain labs and repeat vs, evaluate client and repeat ECG

## 2021-10-12 NOTE — ED Provider Notes (Signed)
[facility] Provider Note   CSN: [id number] Arrival date & time: 10/12/21  0998     History  Chief Complaint  Patient presents with   Shortness of Breath    Brianna Foster is a 66 y.o. female.  The history is provided by the patient and medical records. No language interpreter was used.  Shortness of Breath   66 year old female significant history of hypertension, GERD, tobacco use, obesity presented to ED with complaints of shortness of breath.  Patient reports gradual onset of progressive worsening shortness of breath ongoing for the past 2 weeks.  She noticed increase shortness of breath with exertion and today while walking she feels quite out of breath.  She also endorsed developing central chest pressure that started earlier today.  Does endorse some mild nausea and lightheadedness with this episode.  She denies any diaphoresis.  No significant cough no fever congestion.  She initially thought it could be heartburn but was concerned prompting this ER visit.  She did report remote evaluation for cardiac disease and had a cardiac stress test a few years ago that was normal.  She was placed on several different blood pressure medication that she has been to take regularly.  She denies any prior history of PE or DVT no recent surgery or prolonged bedrest no active cancer hemoptysis no legs pain or calf tenderness.  She currently rates her discomfort as 4 out of 10.  Home Medications Prior to Admission medications   Medication Sig Start Date End Date Taking? Authorizing Provider  albuterol (VENTOLIN HFA) 108 (90 Base) MCG/ACT inhaler INHALE 1-2 PUFFS BY MOUTH EVERY 6 HOURS AS NEEDED FOR WHEEZE OR SHORTNESS OF BREATH 07/19/21   Martinique, Betty G, MD  amLODipine (NORVASC) 5 MG tablet TAKE 1 TABLET (5 MG TOTAL) BY MOUTH DAILY. 06/19/21   Martinique, Betty G, MD  Ascorbic Acid (VITAMIN C) 1000 MG tablet Take 1,000 mg by mouth daily.    [provider]   atorvastatin (LIPITOR) 20 MG tablet Take 1 tablet (20 mg total) by mouth daily. 08/05/20   Martinique, Betty G, MD  cholecalciferol (VITAMIN D3) 25 MCG (1000 UNIT) tablet Take 1,000 Units by mouth daily.    [provider]  clobetasol ointment (TEMOVATE) 3.38 % Apply 1 application topically 2 (two) times a week. Initial dose: twice daily x 14 days, then decrease to daily x 14 days, then every other day x 14 days, then twice weekly for duration of use 04/20/21   Tamela Gammon, NP  fluticasone (FLONASE) 50 MCG/ACT nasal spray Place 1 spray into both nostrils 2 (two) times daily as needed for allergies or rhinitis. 06/20/21   Martinique, Betty G, MD  gabapentin (NEURONTIN) 300 MG capsule Take 1 capsule (300 mg total) by mouth 3 (three) times daily as needed. 01/20/21   Gregor Hams, MD  hydrochlorothiazide (HYDRODIURIL) 25 MG tablet TAKE 1 TABLET (25 MG TOTAL) BY MOUTH DAILY. 06/05/21   Martinique, Betty G, MD  ibuprofen (ADVIL) 600 MG tablet Take 1 tablet (600 mg total) by mouth every 6 (six) hours as needed for moderate pain. 06/26/21   Laurey Morale, MD  meclizine (ANTIVERT) 25 MG tablet TAKE 1 TABLET (25 MG TOTAL) BY MOUTH DAILY AS NEEDED FOR DIZZINESS. 08/07/21   Martinique, Betty G, MD  metoprolol tartrate (LOPRESSOR) 25 MG tablet TAKE 0.5 TABLETS BY MOUTH 2 TIMES DAILY. 02/06/21   Martinique, Betty G, MD  nitroGLYCERIN (NITRODUR - DOSED IN MG/24 HR)  0.2 mg/hr patch PLACE 1/4 TO 1/2 OF A PATCH OVER AFFECTED REGION. REMOVE AND REPLACE ONCE DAILY. SLIGHTLY ALTER SKIN PLACEMENT DAILY 08/16/21   Gregor Hams, MD  pantoprazole (PROTONIX) 40 MG tablet TAKE 1 TABLET BY MOUTH TWICE A DAY 04/12/21   Armbruster, Carlota Raspberry, MD  Spacer/Aero-Holding Chambers (AEROCHAMBER PLUS) inhaler Use as instructed to use with inahaler. 09/02/20   Martinique, Betty G, MD  sucralfate (CARAFATE) 1 g tablet Take 1 tablet (1 g total) by mouth every 6 (six) hours as needed. Slowly dissolve 1 tablet in 1 Tablespoon of distilled water before  ingesting 06/02/20   Armbruster, Carlota Raspberry, MD  triamcinolone cream (KENALOG) 0.1 % APPLY 1 APPLICATION TOPICALLY 2 (TWO) TIMES DAILY AS NEEDED. 02/27/21   Burchette, Alinda Sierras, MD  valACYclovir (VALTREX) 500 MG tablet 1 TAB TWICE DAILY FOR 3 DAYS WITH OUTBREAKS, START WITHIN 48 HOURS AFTER ONSET. 06/22/21   Martinique, Betty G, MD      Allergies    Other    Review of Systems   Review of Systems  Respiratory:  Positive for shortness of breath.   All other systems reviewed and are negative.   Physical Exam Updated Vital Signs BP (!) 158/79 (BP Location: Right Arm)   Pulse (!) 58   Temp 97.8 F (36.6 C) (Oral)   Resp 18   Ht '5\' 10"'$  (1.778 m)   Wt 113.4 kg   LMP 01/07/2010 (LMP Unknown)   SpO2 98%   BMI 35.87 kg/m  Physical Exam Vitals and nursing note reviewed.  Constitutional:      General: She is not in acute distress.    Appearance: She is well-developed.  HENT:     Head: Atraumatic.  Eyes:     Conjunctiva/sclera: Conjunctivae normal.  Cardiovascular:     Rate and Rhythm: Normal rate and regular rhythm.     Pulses: Normal pulses.     Heart sounds: Normal heart sounds.  Pulmonary:     Effort: Pulmonary effort is normal.  Abdominal:     Palpations: Abdomen is soft.  Musculoskeletal:     Cervical back: Neck supple.     Right lower leg: No edema.     Left lower leg: No edema.  Skin:    Findings: No rash.  Neurological:     Mental Status: She is alert.  Psychiatric:        Mood and Affect: Mood normal.     ED Results / Procedures / Treatments   Labs (all labs ordered are listed, but only abnormal results are displayed) Labs Reviewed  BASIC METABOLIC PANEL - Abnormal; Notable for the following components:      Result Value   Glucose, Bld 109 (*)    All other components within normal limits  CBC  D-DIMER, QUANTITATIVE  TROPONIN I (HIGH SENSITIVITY)  TROPONIN I (HIGH SENSITIVITY)    EKG EKG Interpretation  Date/Time:  Thursday October 12 2021 09:10:19  EDT Ventricular Rate:  57 PR Interval:  170 QRS Duration: 96 QT Interval:  412 QTC Calculation: 401 R Axis:   30 Text Interpretation: Sinus bradycardia Cannot rule out Anterior infarct , age undetermined Inferior ST depression.  Somewhat similar to prior but may have large Q wave in 3. Confirmed by Davonna Belling [phone]) on 10/12/2021 9:14:19 AM  Radiology DG Chest 2 View  Result Date: 10/12/2021 CLINICAL DATA:  Chest pain. EXAM: CHEST - 2 VIEW COMPARISON:  July 13, 2020. FINDINGS: No consolidation. No visible pleural effusions or pneumothorax.  Similar cardiomediastinal silhouette. No acute osseous abnormality. IMPRESSION: No acute cardiopulmonary disease. Electronically Signed   By: Margaretha Sheffield M.D.   On: 10/12/2021 09:50    Procedures Procedures    Medications Ordered in ED Medications  nitroGLYCERIN (NITROSTAT) SL tablet 0.4 mg (0.4 mg Sublingual Given 10/12/21 0952)  aspirin chewable tablet 324 mg (324 mg Oral Given 10/12/21 3299)    ED Course/ Medical Decision Making/ A&P                           Medical Decision Making Amount and/or Complexity of Data Reviewed Labs: ordered. Radiology: ordered.  Risk OTC drugs. Prescription drug management.   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  Shortness of breath is an ongoing issue for the past 2 weeks, the chest discomfort that started today.  She also endorsed having some nausea and lightheadedness but no diaphoresis.  She denies any fever or productive cough or leg swelling or calf pain.  She does not have any  significant prior history of PE or DVT and no significant risk factors aside from age.  She still having some residual discomfort.  On exam patient is well-appearing appears to be in no acute discomfort.  Heart and lung sounds normal.  She does not have any signs of fluid overload such as JVD, wet lung sounds, or peripheral edema.  She is not hypoxic, vital signs show evidence of hypertension with a blood pressure of 158/79.  She is mildly bradycardic with a heart rate of 58.  EKG obtained and independently viewed interpreted by me.  EKG shows ST depression in the inferior leads but appears similar to prior EKG.  I have reviewed patient's prior records including prior cardiology notes, labs, and imaging and considered in my plan of care.  Work-up initiated, will also provide ASA and sublingual nitro for symptom control. D-dimer ordered.  1:18 PM Negative delta troponin.  Patient has a hear score of 4, moderate risk of Mace however with improvement of symptoms and flattening troponin trend along with reassuring labs, EKG, and chest x-ray, I felt patient can follow-up outpatient closely with her cardiologist for further management of her condition.  Return precaution given.  This patient presents to the ED for concern of cp, this involves an extensive number of treatment options, and is a complaint that carries with it a high risk of complications and morbidity.  The differential diagnosis includes acs, pe, dissection, costochondritis, gerd, gastritis, pna, ptx  Co morbidities that complicate the patient evaluation unstable angina  Htn  Tobacco use Additional history obtained:  Additional history obtained from patient External records from outside source obtained and reviewed including prior notes from cardiology and outpt visits  Lab Tests:  I Ordered, and personally interpreted labs.  The pertinent results include:  as above  Imaging Studies ordered:  I ordered imaging studies  including cxr I independently visualized and interpreted imaging which showed no acute finding I agree with the radiologist interpretation  Cardiac Monitoring:  The patient was maintained on a cardiac monitor.  I personally viewed and interpreted the cardiac monitored which showed an underlying rhythm of: sinus bradycardia  Medicines ordered and prescription drug management:  I ordered medication including ASA and SL nitro  for cp Reevaluation of the patient after these medicines showed that the patient resolved I have reviewed the patients home medicines and have made adjustments as needed  Test Considered: chest CTA, but with negative d-dimer, doubt PE  Critical Interventions: as above  Consultations Obtained:  I requested consultation with the attending Dr. Dewain Penning,  and discussed lab and imaging findings as well as pertinent plan - they recommend: outpt f/u  Problem List / ED Course: cp  sob  Reevaluation:  After the interventions noted above, I reevaluated the patient and found that they have :improved  Social Determinants of Health: tobacco use  Dispostion:  After consideration of the diagnostic results and the patients response to treatment, I feel that the patent would benefit from outpt f/u with cardiology.         Final Clinical Impression(s) / ED Diagnoses Final diagnoses:  Shortness of breath  Chest pain, unspecified type    Rx / DC Orders ED Discharge Orders     None         Domenic Moras, PA-C 10/12/21 1331    Davonna Belling, MD 10/12/21 1513

## 2021-10-12 NOTE — ED Triage Notes (Signed)
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 CP as well, "burping" did not help. Currently pain 6/10. No resp distress noted

## 2021-10-12 NOTE — ED Notes (Signed)
States she has felt "a little winded" lately, especially for the past week, noted to have to sleep on more pillows at night, does have hx of GERD and OSA and utilizes CPAP device at night. Also having mid sternal, non-radiating chest pain, onset was this am prior to going into work. Denies any shortness of breath, diaphoresis with this chest pain, but did have some nausea.

## 2021-10-12 NOTE — ED Notes (Addendum)
Pt. In no distress.

## 2021-10-12 NOTE — ED Notes (Signed)
Chest Pain resolved following 1 SL NTG tab

## 2021-10-12 NOTE — ED Notes (Signed)
Cont to complain of mid sternal, non radiating pressure, BABY ASA and SL NTG given

## 2021-10-12 NOTE — Discharge Instructions (Signed)
You have been evaluated for your shortness of breath and chest discomfort.  Fortunately your labs, EKG, and chest x-ray today did not show any concerning finding.  However, it is important for you to follow-up closely with your cardiologist for outpatient evaluation of your chest discomfort.  You may benefit from a cardiac stress test if indicated.  Return to the ER if you have any concern.

## 2021-10-12 NOTE — ED Notes (Signed)
2nd Troponin obtained and to the lab

## 2021-10-13 ENCOUNTER — Encounter: Payer: Self-pay | Admitting: Cardiovascular Disease

## 2021-10-13 ENCOUNTER — Ambulatory Visit (INDEPENDENT_AMBULATORY_CARE_PROVIDER_SITE_OTHER): Payer: 59 | Admitting: Cardiovascular Disease

## 2021-10-13 DIAGNOSIS — E782 Mixed hyperlipidemia: Secondary | ICD-10-CM | POA: Diagnosis not present

## 2021-10-13 DIAGNOSIS — Z720 Tobacco use: Secondary | ICD-10-CM | POA: Diagnosis not present

## 2021-10-13 DIAGNOSIS — I10 Essential (primary) hypertension: Secondary | ICD-10-CM

## 2021-10-13 DIAGNOSIS — G4733 Obstructive sleep apnea (adult) (pediatric): Secondary | ICD-10-CM | POA: Diagnosis not present

## 2021-10-13 DIAGNOSIS — I200 Unstable angina: Secondary | ICD-10-CM | POA: Diagnosis not present

## 2021-10-13 NOTE — Assessment & Plan Note (Signed)
History of essential hypertension blood pressure measured today at 126/74.  She is on amlodipine, hydrochlorothiazide and metoprolol.

## 2021-10-13 NOTE — Assessment & Plan Note (Signed)
Cardiac cath in 2015 and again by Dr. Martinique 07/11/2017  in the setting of chest pain and left bundle branch block.  She does have GERD.  She was recently seen in the ER yesterday with chest pain and shortness of breath.  Troponins were negative as was her D-dimer.  She feels better this morning after using her inhaler.

## 2021-10-13 NOTE — Assessment & Plan Note (Signed)
History of morbid obesity with a BMI of 36.  I did offer her current diet wellness but she appears first to try on her own.

## 2021-10-13 NOTE — Patient Instructions (Signed)
Medication Instructions:  Your physician recommends that you continue on your current medications as directed. Please refer to the Current Medication list given to you today.  *If you need a refill on your cardiac medications before your next appointment, please call your pharmacy*   Follow-Up: At [facility], you and your health needs are our priority.  As part of our continuing mission to provide you with exceptional heart care, we have created designated Provider Care Teams.  These Care Teams include your primary Cardiologist (physician) and Advanced Practice Providers (APPs -  Physician Assistants and Nurse Practitioners) who all work together to provide you with the care you need, when you need it.  We recommend signing up for the patient portal called "MyChart".  Sign up information is provided on this After Visit Summary.  MyChart is used to connect with patients for Virtual Visits (Telemedicine).  Patients are able to view lab/test results, encounter notes, upcoming appointments, etc.  Non-urgent messages can be sent to your provider as well.   To learn more about what you can do with MyChart, go to [email or web address].    Your next appointment:   3 month(s)  The format for your next appointment:   In Person  Provider:   Fabian Sharp, PA-C, Sande Rives, PA-C, Caron Presume, PA-C, Jory Sims, DNP, ANP, Almyra Deforest, PA-C, or Diona Browner, NP        Then, Quay Burow, MD will plan to see you again in 6 month(s).

## 2021-10-13 NOTE — Assessment & Plan Note (Signed)
History of hyperlipidemia on statin therapy lipid profile performed 08/02/2020 revealing total cholesterol 177, LDL 111 and HDL 43.  This was acceptable for primary prevention.

## 2021-10-13 NOTE — Assessment & Plan Note (Signed)
History of obstructive sleep apnea on CPAP. 

## 2021-10-13 NOTE — Assessment & Plan Note (Signed)
History of recently discontinued tobacco use 4 months ago.  She smoked off and on since she was a teenager but not in significant amount.

## 2021-10-13 NOTE — Progress Notes (Signed)
10/13/2021 Brianna Foster   [birth year]  [id number]  Primary Physician Martinique, Betty G, MD Primary Cardiologist: Lorretta Harp MD [name], [place]  HPI:  Brianna Foster is a 66 y.o. 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  She was referred by the emergency room for chest pain that she was evaluated for yesterday.  She did see Dr. Stanford Breed in the office 4 years ago.  She has a history of treated hypertension hyperlipidemia.  There is no family history of heart disease.  She recently stopped smoking 4 months ago.  She does have bad GERD.  She apparently gained 30 pounds over COVID.  She had a normal 2015 and again in 2019.  She was seen in the ER yesterday with for chest pain and shortness of breath.  Her troponins were negative as was her D-dimer.  She feels clinically improved this morning.   Current Meds  Medication Sig   albuterol (VENTOLIN HFA) 108 (90 Base) MCG/ACT inhaler INHALE 1-2 PUFFS BY MOUTH EVERY 6 HOURS AS NEEDED FOR WHEEZE OR SHORTNESS OF BREATH   amLODipine (NORVASC) 5 MG tablet TAKE 1 TABLET (5 MG TOTAL) BY MOUTH DAILY.   Ascorbic Acid (VITAMIN C) 1000 MG tablet Take 1,000 mg by mouth daily.   atorvastatin (LIPITOR) 20 MG tablet Take 1 tablet (20 mg total) by mouth daily.   cholecalciferol (VITAMIN D3) 25 MCG (1000 UNIT) tablet Take 1,000 Units by mouth daily.   clobetasol ointment (TEMOVATE) 6.60 % Apply 1 application topically 2 (two) times a week. Initial dose: twice daily x 14 days, then decrease to daily x 14 days, then every other day x 14 days, then twice weekly for duration of use   fluticasone (FLONASE) 50 MCG/ACT nasal spray Place 1 spray into both nostrils 2 (two) times daily as needed for allergies or rhinitis.   gabapentin (NEURONTIN) 300 MG capsule Take 1 capsule (300 mg total) by mouth 3 (three) times daily as needed.    hydrochlorothiazide (HYDRODIURIL) 25 MG tablet TAKE 1 TABLET (25 MG TOTAL) BY MOUTH DAILY.   ibuprofen (ADVIL) 600 MG tablet Take 1 tablet (600 mg total) by mouth every 6 (six) hours as needed for moderate pain.   meclizine (ANTIVERT) 25 MG tablet TAKE 1 TABLET (25 MG TOTAL) BY MOUTH DAILY AS NEEDED FOR DIZZINESS.   metoprolol tartrate (LOPRESSOR) 25 MG tablet TAKE 0.5 TABLETS BY MOUTH 2 TIMES DAILY.   nitroGLYCERIN (NITRODUR - DOSED IN MG/24 HR) 0.2 mg/hr patch PLACE 1/4 TO 1/2 OF A PATCH OVER AFFECTED REGION. REMOVE AND REPLACE ONCE DAILY. SLIGHTLY ALTER SKIN PLACEMENT DAILY   pantoprazole (PROTONIX) 40 MG tablet TAKE 1 TABLET BY MOUTH TWICE A DAY   Spacer/Aero-Holding Chambers (AEROCHAMBER PLUS) inhaler Use as instructed to use with inahaler.   sucralfate (CARAFATE) 1 Foster tablet Take 1 tablet (1 Foster total) by mouth every 6 (six) hours as needed. Slowly dissolve 1 tablet in 1 Tablespoon of distilled water before ingesting   triamcinolone cream (KENALOG) 0.1 % APPLY 1 APPLICATION TOPICALLY 2 (TWO) TIMES DAILY AS NEEDED.   valACYclovir (VALTREX) 500 MG tablet 1 TAB TWICE DAILY FOR 3 DAYS WITH OUTBREAKS, START WITHIN 48 HOURS AFTER ONSET.     Allergies  Allergen Reactions   Other Anaphylaxis, Shortness Of Breath and Swelling    [place] NUTS    Social History   Socioeconomic History  Marital status: Divorced    Spouse name: Not on file   Number of children: 1   Years of education: College   Highest education level: Not on file  Occupational History    Comment: Glass blower/designer  Tobacco Use   Smoking status: Former    Packs/day: 0.25    Years: 16.00    Total pack years: 4.00    Types: Cigarettes    Quit date: 03/09/2018    Years since quitting: 3.6   Smokeless tobacco: Never  Vaping Use   Vaping Use: Never used  Substance and Sexual Activity   Alcohol use: Yes    Comment: socially   Drug use: No   Sexual activity: Not Currently    Birth control/protection: Post-menopausal  Other  Topics Concern   Not on file  Social History Narrative   Lives at home alone   Right-handed   Drinks decaf coffee   Social Determinants of Health   Financial Resource Strain: Not on file  Food Insecurity: Not on file  Transportation Needs: Not on file  Physical Activity: Not on file  Stress: Not on file  Social Connections: Not on file  Intimate Partner Violence: Not on file     Review of Systems: General: negative for chills, fever, night sweats or weight changes.  Cardiovascular: negative for chest pain, dyspnea on exertion, edema, orthopnea, palpitations, paroxysmal nocturnal dyspnea or shortness of breath Dermatological: negative for rash Respiratory: negative for cough or wheezing Urologic: negative for hematuria Abdominal: negative for nausea, vomiting, diarrhea, bright red blood per rectum, melena, or hematemesis Neurologic: negative for visual changes, syncope, or dizziness All other systems reviewed and are otherwise negative except as noted above.    Blood pressure 126/74, pulse 62, height '5\' 10"'$  (1.778 m), weight 256 lb (116.1 kg), last menstrual period 01/07/2010.  General appearance: alert and no distress Neck: no adenopathy, no carotid bruit, no JVD, supple, symmetrical, trachea midline, and thyroid not enlarged, symmetric, no tenderness/mass/nodules Lungs: clear to auscultation bilaterally Heart: regular rate and rhythm, S1, S2 normal, no murmur, click, rub or gallop Extremities: extremities normal, atraumatic, no cyanosis or edema Pulses: 2+ and symmetric Skin: Skin color, texture, turgor normal. No rashes or lesions Neurologic: Grossly normal  EKG sinus rhythm at 62 with LVH voltage and early repole changes.  I personally reviewed this EKG.  ASSESSMENT AND PLAN:   Essential hypertension History of essential hypertension blood pressure measured today at 126/74.  She is on amlodipine, hydrochlorothiazide and metoprolol.  Tobacco abuse History of recently  discontinued tobacco use 4 months ago.  She smoked off and on since she was a teenager but not in significant amount.  Hyperlipidemia, unspecified History of hyperlipidemia on statin therapy lipid profile performed 08/02/2020 revealing total cholesterol 177, LDL 111 and HDL 43.  This was acceptable for primary prevention.  Unstable angina [facility]) Cardiac cath in 2015 and again by Dr. Martinique 07/11/2017  in the setting of chest pain and left bundle branch block.  She does have GERD.  She was recently seen in the ER yesterday with chest pain and shortness of breath.  Troponins were negative as was her D-dimer.  She feels better this morning after using her inhaler.  Morbid obesity ([place]) History of morbid obesity with a BMI of 36.  I did offer her current diet wellness but she appears first to try on her own.  OSA (obstructive sleep apnea) History of obstructive sleep apnea on CPAP     Lorretta Harp  MD FACP,FACC,FAHA, FSCAI 10/13/2021 10:30 AM

## 2021-10-17 NOTE — Progress Notes (Signed)
HPI: Brianna Foster is a 66 y.o. female, who is here today for her routine physical.  Last CPE: 08/02/20  She has not been exercising regularly nor consistent with following a healthful diet.  Chronic medical problems: OSA, hypertension, hyperlipidemia, GERD, and vertigo among some. She follows with gastroenterologist and pulmonologist regularly.  Immunization History  Administered Date(s) Administered   Influenza Split 02/03/2008   PFIZER(Purple Top)SARS-COV-2 Vaccination 06/14/2019, 07/07/2019   Tdap 12/11/2016   Zoster Recombinat (Shingrix) 10/18/2021   Health Maintenance  Topic Date Due   COVID-19 Vaccine (3 - Pfizer series) 09/01/2019   COLONOSCOPY (Pts 45-12yrs Insurance coverage will need to be confirmed)  04/26/2020   INFLUENZA VACCINE  11/07/2021   Zoster Vaccines- Shingrix (2 of 2) 12/13/2021   PAP SMEAR-Modifier  07/20/2022   MAMMOGRAM  03/15/2023   TETANUS/TDAP  12/12/2026   Hepatitis C Screening  Completed   HIV Screening  Completed   HPV VACCINES  Aged Out   She takes Ca++ and vit D supplementation. Follows with gyn regularly.  HLD: She is not taking Atorvastatin.  Lab Results  Component Value Date   CHOL 177 08/02/2020   HDL 43.90 08/02/2020   LDLCALC 111 (H) 08/02/2020   LDLDIRECT 145.2 07/23/2012   TRIG 109.0 08/02/2020   CHOLHDL 4 08/02/2020   HTN: She is on amlodipine 5 mg daily, metoprolol titrate 25 mg one half twice daily, and HCTZ 25 mg daily. She follows with cardiologist. She was in the ED on 10/12/2021 for SOB and CP. Evaluated by cardiologist on 10/13/2021 and according to pt, cardiac etiology was ruled out. She was prescribed an inhaler and has helped.  Lab Results  Component Value Date   CREATININE 0.85 10/12/2021   BUN 14 10/12/2021   NA 137 10/12/2021   K 4.0 10/12/2021   CL 101 10/12/2021   CO2 26 10/12/2021   She had head CT on 10/10/2021 because post traumatic headache concerns. 1. No acute intracranial process. 2.  Subcortical white matter hypodensities which may be associated with chronic microvascular ischemic changes or other demyelinating process. Headache has resolved.  Negative for numbness, tingling,or focal weakness.  Review of Systems  Constitutional:  Negative for activity change, appetite change and fever.  HENT:  Negative for hearing loss, mouth sores, sore throat and trouble swallowing.   Eyes:  Negative for redness and visual disturbance.  Respiratory:  Negative for cough, shortness of breath and wheezing.   Cardiovascular:  Negative for chest pain and leg swelling.  Gastrointestinal:  Negative for abdominal pain, nausea and vomiting.       No changes in bowel habits.  Endocrine: Negative for cold intolerance, heat intolerance, polydipsia, polyphagia and polyuria.  Genitourinary:  Negative for decreased urine volume, dysuria, hematuria, vaginal bleeding and vaginal discharge.  Musculoskeletal:  Positive for arthralgias and back pain. Negative for gait problem and myalgias.  Skin:  Negative for color change and rash.  Allergic/Immunologic: Positive for environmental allergies.  Neurological:  Negative for seizures, syncope, weakness and headaches.  Hematological:  Negative for adenopathy. Does not bruise/bleed easily.  Psychiatric/Behavioral:  Negative for confusion. The patient is not nervous/anxious.   All other systems reviewed and are negative.  Current Outpatient Medications on File Prior to Visit  Medication Sig Dispense Refill   albuterol (VENTOLIN HFA) 108 (90 Base) MCG/ACT inhaler INHALE 1-2 PUFFS BY MOUTH EVERY 6 HOURS AS NEEDED FOR WHEEZE OR SHORTNESS OF BREATH 8.5 each 1   amLODipine (NORVASC) 5 MG tablet TAKE 1 TABLET (  5 MG TOTAL) BY MOUTH DAILY. 90 tablet 2   Ascorbic Acid (VITAMIN C) 1000 MG tablet Take 1,000 mg by mouth daily.     atorvastatin (LIPITOR) 20 MG tablet Take 1 tablet (20 mg total) by mouth daily. 90 tablet 3   cholecalciferol (VITAMIN D3) 25 MCG (1000 UNIT)  tablet Take 1,000 Units by mouth daily.     clobetasol ointment (TEMOVATE) 0.05 % Apply 1 application topically 2 (two) times a week. Initial dose: twice daily x 14 days, then decrease to daily x 14 days, then every other day x 14 days, then twice weekly for duration of use 45 g 1   fluticasone (FLONASE) 50 MCG/ACT nasal spray Place 1 spray into both nostrils 2 (two) times daily as needed for allergies or rhinitis. 16 g 5   gabapentin (NEURONTIN) 300 MG capsule Take 1 capsule (300 mg total) by mouth 3 (three) times daily as needed. 90 capsule 3   hydrochlorothiazide (HYDRODIURIL) 25 MG tablet TAKE 1 TABLET (25 MG TOTAL) BY MOUTH DAILY. 90 tablet 2   ibuprofen (ADVIL) 600 MG tablet Take 1 tablet (600 mg total) by mouth every 6 (six) hours as needed for moderate pain. 60 tablet 0   meclizine (ANTIVERT) 25 MG tablet TAKE 1 TABLET (25 MG TOTAL) BY MOUTH DAILY AS NEEDED FOR DIZZINESS. 30 tablet 2   nitroGLYCERIN (NITRODUR - DOSED IN MG/24 HR) 0.2 mg/hr patch PLACE 1/4 TO 1/2 OF A PATCH OVER AFFECTED REGION. REMOVE AND REPLACE ONCE DAILY. SLIGHTLY ALTER SKIN PLACEMENT DAILY 30 patch 1   pantoprazole (PROTONIX) 40 MG tablet TAKE 1 TABLET BY MOUTH TWICE A DAY 180 tablet 0   Spacer/Aero-Holding Chambers (AEROCHAMBER PLUS) inhaler Use as instructed to use with inahaler. 1 each 1   sucralfate (CARAFATE) 1 g tablet Take 1 tablet (1 g total) by mouth every 6 (six) hours as needed. Slowly dissolve 1 tablet in 1 Tablespoon of distilled water before ingesting 60 tablet 3   triamcinolone cream (KENALOG) 0.1 % APPLY 1 APPLICATION TOPICALLY 2 (TWO) TIMES DAILY AS NEEDED. 45 g 1   valACYclovir (VALTREX) 500 MG tablet 1 TAB TWICE DAILY FOR 3 DAYS WITH OUTBREAKS, START WITHIN 48 HOURS AFTER ONSET. 18 tablet 1   No current facility-administered medications on file prior to visit.   Past Medical History:  Diagnosis Date   Arthritis    COVID-19 03/2020   had pneumonia   GERD (gastroesophageal reflux disease)     Headache(784.0)    Hypertension    OSA (obstructive sleep apnea) 08/28/2017   uses a Cpap   Pneumonia    Past Surgical History:  Procedure Laterality Date   CERVICAL FUSION     LEFT HEART CATH AND CORONARY ANGIOGRAPHY N/A 07/11/2017   Procedure: LEFT HEART CATH AND CORONARY ANGIOGRAPHY;  Surgeon: Swaziland, Peter M, MD;  Location[facility] INVASIVE CV LAB;  Service: Cardiovascular;  Laterality: N/A;   LEFT HEART CATHETERIZATION WITH CORONARY ANGIOGRAM N/A 05/11/2013   Procedure: LEFT HEART CATHETERIZATION WITH CORONARY ANGIOGRAM;  Surgeon: Kathleene Hazel, MD;  Location: [facility] CATH LAB;  Service: Cardiovascular;  Laterality: N/A;   neck fusion     RADIOLOGY WITH ANESTHESIA Right 02/28/2021   Procedure: MRI OF THE ELBOW WITHOUT CONTRAST WITH ANESTHESIA;  Surgeon: Radiologist, Medication, MD;  Location: [facility] OR;  Service: Radiology;  Laterality: Right;   Allergies  Allergen Reactions   Other Anaphylaxis, Shortness Of Breath and Swelling    [place] NUTS   Family History  Problem Relation Age  of Onset   Lung cancer Mother    Stomach cancer Father    Heart disease Brother        Pacemaker   Esophageal cancer Brother    Breast cancer Sister    Colon cancer Neg Hx    Pancreatic cancer Neg Hx    Liver disease Neg Hx    Rectal cancer Neg Hx    Social History   Socioeconomic History   Marital status: Divorced    Spouse name: Not on file   Number of children: 1   Years of education: College   Highest education level: Not on file  Occupational History    Comment: Patent attorney  Tobacco Use   Smoking status: Former    Packs/day: 0.25    Years: 16.00    Total pack years: 4.00    Types: Cigarettes    Quit date: 03/09/2018    Years since quitting: 3.6   Smokeless tobacco: Never  Vaping Use   Vaping Use: Never used  Substance and Sexual Activity   Alcohol use: Yes    Comment: socially   Drug use: No   Sexual activity: Not Currently    Birth control/protection: Post-menopausal   Other Topics Concern   Not on file  Social History Narrative   Lives at home alone   Right-handed   Drinks decaf coffee   Social Determinants of Health   Financial Resource Strain: Not on file  Food Insecurity: Not on file  Transportation Needs: Not on file  Physical Activity: Not on file  Stress: Not on file  Social Connections: Not on file   Vitals:   10/18/21 0656  BP: 128/80  Pulse: 68  Resp: 12  SpO2: 97%  Body mass index is 36.77 kg/m. Wt Readings from Last 3 Encounters:  10/18/21 256 lb 4 oz (116.2 kg)  10/13/21 256 lb (116.1 kg)  10/12/21 250 lb (113.4 kg)   Physical Exam Vitals and nursing note reviewed.  Constitutional:      General: She is not in acute distress.    Appearance: She is well-developed.  HENT:     Head: Normocephalic and atraumatic.     Right Ear: Hearing, tympanic membrane, ear canal and external ear normal.     Left Ear: Hearing, tympanic membrane, ear canal and external ear normal.     Mouth/Throat:     Mouth: Mucous membranes are moist.     Pharynx: Oropharynx is clear. Uvula midline.  Eyes:     Extraocular Movements: Extraocular movements intact.     Conjunctiva/sclera: Conjunctivae normal.     Pupils: Pupils are equal, round, and reactive to light.  Neck:     Thyroid: No thyromegaly.     Trachea: No tracheal deviation.  Cardiovascular:     Rate and Rhythm: Normal rate and regular rhythm.     Pulses:          Dorsalis pedis pulses are 2+ on the right side and 2+ on the left side.     Heart sounds: No murmur heard. Pulmonary:     Effort: Pulmonary effort is normal. No respiratory distress.     Breath sounds: Normal breath sounds.  Abdominal:     Palpations: Abdomen is soft. There is no hepatomegaly or mass.     Tenderness: There is no abdominal tenderness.  Genitourinary:    Comments: Deferred to gyn. Musculoskeletal:     Comments: No signs of synovitis appreciated.  Lymphadenopathy:     Cervical: No cervical  adenopathy.      Upper Body:     Right upper body: No supraclavicular adenopathy.     Left upper body: No supraclavicular adenopathy.  Skin:    General: Skin is warm.     Findings: No erythema or rash.  Neurological:     General: No focal deficit present.     Mental Status: She is alert and oriented to person, place, and time.     Cranial Nerves: No cranial nerve deficit.     Coordination: Coordination normal.     Gait: Gait normal.     Deep Tendon Reflexes:     Reflex Scores:      Bicep reflexes are 2+ on the right side and 2+ on the left side.      Patellar reflexes are 2+ on the right side and 2+ on the left side. Psychiatric:     Comments: Well groomed, good eye contact.    ASSESSMENT AND PLAN:  Ms. SHAUNTI BATTENFIELD was here today annual physical examination.  Orders Placed This Encounter  Procedures   DEXAScan   Varicella-zoster vaccine IM   Hemoglobin A1c   Lipid panel   Hepatic function panel   Ambulatory referral to Gastroenterology   Lab Results  Component Value Date   HGBA1C 6.1 10/18/2021   Lab Results  Component Value Date   CHOL 193 10/18/2021   HDL 53.90 10/18/2021   LDLCALC 124 (H) 10/18/2021   LDLDIRECT 145.2 07/23/2012   TRIG 77.0 10/18/2021   CHOLHDL 4 10/18/2021   Lab Results  Component Value Date   ALT 27 10/18/2021   AST 23 10/18/2021   ALKPHOS 69 10/18/2021   BILITOT 0.3 10/18/2021   Routine general medical examination at a health care facility We discussed the importance of regular physical activity and healthy diet for prevention of chronic illness and/or complications. Preventive guidelines reviewed. Vaccination updated. Continue following with gynecology for her female preventive care. Ca++ and vit D supplementation to continue. Next CPE in a year.  The 10-year ASCVD risk score (Arnett DK, et al., 2019) is: 8.4%   Values used to calculate the score:     Age: 56 years     Sex: Female     Is Non-Hispanic African American: Yes     Diabetic:  No     Tobacco smoker: No     Systolic Blood Pressure: 128 mmHg     Is BP treated: Yes     HDL Cholesterol: 53.9 mg/dL     Total Cholesterol: 193 mg/dL  Screening for endocrine, metabolic and immunity disorder -     Hemoglobin A1c  Asymptomatic postmenopausal estrogen deficiency -     DEXAScan; Future  Colon cancer screening -     Ambulatory referral to Gastroenterology  Need for shingles vaccine -     Varicella-zoster vaccine IM  Hyperlipidemia, unspecified She is not on pharmacologic treatment. We discussed benefits of statins. We will wait for lipid panel results to be back and will make recommendations accordingly.  Essential hypertension BP adequately controlled. Continue amlodipine 5 mg daily, HCTZ 25 mg daily, and metoprolol titrate 25 mg 1/2 tablet twice daily.  Return in 6 months (on 04/20/2022).  Rafia Shedden G. [place], MD  [facility]. [place] office.

## 2021-10-18 ENCOUNTER — Encounter: Payer: Self-pay | Admitting: Family Medicine

## 2021-10-18 ENCOUNTER — Ambulatory Visit (INDEPENDENT_AMBULATORY_CARE_PROVIDER_SITE_OTHER): Payer: 59 | Admitting: Family Medicine

## 2021-10-18 VITALS — BP 128/80 | HR 68 | Resp 12 | Ht 70.0 in | Wt 256.2 lb

## 2021-10-18 DIAGNOSIS — Z13228 Encounter for screening for other metabolic disorders: Secondary | ICD-10-CM | POA: Diagnosis not present

## 2021-10-18 DIAGNOSIS — E782 Mixed hyperlipidemia: Secondary | ICD-10-CM

## 2021-10-18 DIAGNOSIS — Z1329 Encounter for screening for other suspected endocrine disorder: Secondary | ICD-10-CM | POA: Diagnosis not present

## 2021-10-18 DIAGNOSIS — I10 Essential (primary) hypertension: Secondary | ICD-10-CM | POA: Diagnosis not present

## 2021-10-18 DIAGNOSIS — Z23 Encounter for immunization: Secondary | ICD-10-CM

## 2021-10-18 DIAGNOSIS — Z1211 Encounter for screening for malignant neoplasm of colon: Secondary | ICD-10-CM

## 2021-10-18 DIAGNOSIS — Z130 Encounter for screening for diseases of the blood and blood-forming organs and certain disorders involving the immune mechanism: Secondary | ICD-10-CM

## 2021-10-18 DIAGNOSIS — Z0000 Encounter for general adult medical examination without abnormal findings: Secondary | ICD-10-CM

## 2021-10-18 DIAGNOSIS — Z780 Asymptomatic menopausal state: Secondary | ICD-10-CM

## 2021-10-18 LAB — LIPID PANEL
Cholesterol: 193 mg/dL (ref 0–200)
HDL: 53.9 mg/dL (ref >39.00)
LDL Cholesterol: 124 mg/dL — ABNORMAL HIGH (ref 0–99)
NonHDL: 139.32
Total CHOL/HDL Ratio: 4
Triglycerides: 77 mg/dL (ref 0.0–149.0)
VLDL: 15.4 mg/dL (ref 0.0–40.0)

## 2021-10-18 LAB — HEPATIC FUNCTION PANEL
ALT: 27 U/L (ref 0–35)
AST: 23 U/L (ref 0–37)
Albumin: 4.4 g/dL (ref 3.5–5.2)
Alkaline Phosphatase: 69 U/L (ref 39–117)
Bilirubin, Direct: 0.1 mg/dL (ref 0.0–0.3)
Total Bilirubin: 0.3 mg/dL (ref 0.2–1.2)
Total Protein: 7.9 g/dL (ref 6.0–8.3)

## 2021-10-18 LAB — HEMOGLOBIN A1C: Hgb A1c MFr Bld: 6.1 % (ref 4.6–6.5)

## 2021-10-18 MED ORDER — METOPROLOL TARTRATE 25 MG PO TABS: ORAL_TABLET | ORAL | 2 refills | Status: AC

## 2021-10-18 NOTE — Assessment & Plan Note (Signed)
BP adequately controlled. Continue amlodipine 5 mg daily, HCTZ 25 mg daily, and metoprolol titrate 25 mg 1/2 tablet twice daily.

## 2021-10-18 NOTE — Assessment & Plan Note (Signed)
She is not on pharmacologic treatment. We discussed benefits of statins. We will wait for lipid panel results to be back and will make recommendations accordingly.

## 2021-10-18 NOTE — Patient Instructions (Addendum)
A few things to remember from today's visit:  Routine general medical examination at a health care facility  Screening for endocrine, metabolic and immunity disorder  Essential hypertension  Mixed hyperlipidemia  Asymptomatic postmenopausal estrogen deficiency - Plan: DEXAScan  Colon cancer screening - Plan: Ambulatory referral to Gastroenterology  If you need refills please call your pharmacy. Do not use My Chart to request refills or for acute issues that need immediate attention.   Please be sure medication list is accurate. If a new problem present, please set up appointment sooner than planned today.  Intermittent fasting may help with weigh loss. Avoid foods that can irritate your stomach like chocolate. Continue following with your gynecologists for pap smears, you are due next year.  Health Maintenance, Female Adopting a healthy lifestyle and getting preventive care are important in promoting health and wellness. Ask your health care provider about: The right schedule for you to have regular tests and exams. Things you can do on your own to prevent diseases and keep yourself healthy. What should I know about diet, weight, and exercise? Eat a healthy diet  Eat a diet that includes plenty of vegetables, fruits, low-fat dairy products, and lean protein. Do not eat a lot of foods that are high in solid fats, added sugars, or sodium. Maintain a healthy weight Body mass index (BMI) is used to identify weight problems. It estimates body fat based on height and weight. Your health care provider can help determine your BMI and help you achieve or maintain a healthy weight. Get regular exercise Get regular exercise. This is one of the most important things you can do for your health. Most adults should: Exercise for at least 150 minutes each week. The exercise should increase your heart rate and make you sweat (moderate-intensity exercise). Do strengthening exercises at least  twice a week. This is in addition to the moderate-intensity exercise. Spend less time sitting. Even light physical activity can be beneficial. Watch cholesterol and blood lipids Have your blood tested for lipids and cholesterol at 65 years of age, then have this test every 5 years. Have your cholesterol levels checked more often if: Your lipid or cholesterol levels are high. You are older than 65 years of age. You are at high risk for heart disease. What should I know about cancer screening? Depending on your health history and family history, you may need to have cancer screening at various ages. This may include screening for: Breast cancer. Cervical cancer. Colorectal cancer. Skin cancer. Lung cancer. What should I know about heart disease, diabetes, and high blood pressure? Blood pressure and heart disease High blood pressure causes heart disease and increases the risk of stroke. This is more likely to develop in people who have high blood pressure readings or are overweight. Have your blood pressure checked: Every 3-5 years if you are 3-72 years of age. Every year if you are 47 years old or older. Diabetes Have regular diabetes screenings. This checks your fasting blood sugar level. Have the screening done: Once every three years after age 63 if you are at a normal weight and have a low risk for diabetes. More often and at a younger age if you are overweight or have a high risk for diabetes. What should I know about preventing infection? Hepatitis B If you have a higher risk for hepatitis B, you should be screened for this virus. Talk with your health care provider to find out if you are at risk for hepatitis B  infection. Hepatitis C Testing is recommended for: Everyone born from [birth year] through 1964. Anyone with known risk factors for hepatitis C. Sexually transmitted infections (STIs) Get screened for STIs, including gonorrhea and chlamydia, if: You are sexually active and are  younger than 65 years of age. You are older than 65 years of age and your health care provider tells you that you are at risk for this type of infection. Your sexual activity has changed since you were last screened, and you are at increased risk for chlamydia or gonorrhea. Ask your health care provider if you are at risk. Ask your health care provider about whether you are at high risk for HIV. Your health care provider may recommend a prescription medicine to help prevent HIV infection. If you choose to take medicine to prevent HIV, you should first get tested for HIV. You should then be tested every 3 months for as long as you are taking the medicine. Pregnancy If you are about to stop having your period (premenopausal) and you may become pregnant, seek counseling before you get pregnant. Take 400 to 800 micrograms (mcg) of folic acid every day if you become pregnant. Ask for birth control (contraception) if you want to prevent pregnancy. Osteoporosis and menopause Osteoporosis is a disease in which the bones lose minerals and strength with aging. This can result in bone fractures. If you are 31 years old or older, or if you are at risk for osteoporosis and fractures, ask your health care provider if you should: Be screened for bone loss. Take a calcium or vitamin D supplement to lower your risk of fractures. Be given hormone replacement therapy (HRT) to treat symptoms of menopause. Follow these instructions at home: Alcohol use Do not drink alcohol if: Your health care provider tells you not to drink. You are pregnant, may be pregnant, or are planning to become pregnant. If you drink alcohol: Limit how much you have to: 0-1 drink a day. Know how much alcohol is in your drink. In the [place] one drink equals one 12 oz bottle of beer (355 mL), one 5 oz glass of wine (148 mL), or one 1 oz glass of hard liquor (44 mL). Lifestyle Do not use any products that contain nicotine or tobacco. These  products include cigarettes, chewing tobacco, and vaping devices, such as e-cigarettes. If you need help quitting, ask your health care provider. Do not use street drugs. Do not share needles. Ask your health care provider for help if you need support or information about quitting drugs. General instructions Schedule regular health, dental, and eye exams. Stay current with your vaccines. Tell your health care provider if: You often feel depressed. You have ever been abused or do not feel safe at home. Summary Adopting a healthy lifestyle and getting preventive care are important in promoting health and wellness. Follow your health care provider's instructions about healthy diet, exercising, and getting tested or screened for diseases. Follow your health care provider's instructions on monitoring your cholesterol and blood pressure. This information is not intended to replace advice given to you by your health care provider. Make sure you discuss any questions you have with your health care provider. Document Revised: 08/15/2020 Document Reviewed: 08/15/2020 Elsevier Patient Education  [place].

## 2021-10-19 MED ORDER — ATORVASTATIN CALCIUM 20 MG PO TABS: 20.0000 mg | ORAL_TABLET | Freq: Every day | ORAL | 3 refills | Status: DC

## 2021-10-29 ENCOUNTER — Other Ambulatory Visit: Payer: Self-pay | Admitting: Gastroenterology

## 2021-11-03 DIAGNOSIS — G4733 Obstructive sleep apnea (adult) (pediatric): Secondary | ICD-10-CM | POA: Diagnosis not present

## 2021-11-26 ENCOUNTER — Other Ambulatory Visit: Payer: Self-pay | Admitting: Gastroenterology

## 2021-12-04 DIAGNOSIS — G4733 Obstructive sleep apnea (adult) (pediatric): Secondary | ICD-10-CM | POA: Diagnosis not present

## 2021-12-31 ENCOUNTER — Other Ambulatory Visit: Payer: Self-pay | Admitting: Gastroenterology

## 2022-01-12 ENCOUNTER — Ambulatory Visit: Payer: 59 | Admitting: Sports Medicine

## 2022-01-12 ENCOUNTER — Ambulatory Visit: Payer: 59 | Attending: Cardiovascular Disease | Admitting: Physician Assistant

## 2022-01-12 VITALS — BP 132/82 | HR 57 | Ht 70.0 in | Wt 258.0 lb

## 2022-01-12 DIAGNOSIS — G8929 Other chronic pain: Secondary | ICD-10-CM | POA: Diagnosis not present

## 2022-01-12 DIAGNOSIS — M5136 Other intervertebral disc degeneration, lumbar region: Secondary | ICD-10-CM | POA: Diagnosis not present

## 2022-01-12 DIAGNOSIS — M5441 Lumbago with sciatica, right side: Secondary | ICD-10-CM | POA: Diagnosis not present

## 2022-01-12 MED ORDER — CYCLOBENZAPRINE HCL 5 MG PO TABS: 5.0000 mg | ORAL_TABLET | Freq: Three times a day (TID) | ORAL | 0 refills | Status: DC | PRN

## 2022-01-12 MED ORDER — MELOXICAM 15 MG PO TABS: 15.0000 mg | ORAL_TABLET | Freq: Every day | ORAL | 0 refills | Status: DC

## 2022-01-12 NOTE — Progress Notes (Signed)
Benito Mccreedy D.[place] Phone: [phone]   Assessment and Plan:     1. Chronic bilateral low back pain with right-sided sciatica 2. DDD (degenerative disc disease), lumbar  -Chronic with exacerbation, initial sports medicine visit - Suspect flare of lumbar DDD causing low back pain and right-sided radicular symptoms - Start meloxicam 15 mg daily x2 weeks.  If still having pain after 2 weeks, complete 3rd-week of meloxicam. May use remaining meloxicam as needed once daily for pain control.  Do not to use additional NSAIDs while taking meloxicam.  May use Tylenol [phone] mg 2 to 3 times a day for breakthrough pain. -Start Flexeril 5 to 10 mg nightly as needed for muscle spasms - Start HEP for low back  Pertinent previous records reviewed include lumbar x-ray 07/2020   Follow Up: 3 weeks for reevaluation.  If no improvement or worsening of symptoms, would consider physical therapy versus advanced imaging with MRI   Subjective:   I, Moenique Parris, am serving as a Education administrator for Doctor Glennon Mac  Chief Complaint: back pain   HPI:   01/12/22 Patient is a 66 year old female complaining of back pain. Patient states low back pain , been going on for a couple of month but wasn't as bad , has pain when standing , is fine when sitting, no MOI, has always had back pain when standing, has been taking ib and tylenol for the pain and that does seem to help some, but the relief doesn't last long, pain radiates down the side of her leg , no numbness or tingling, pain is really bad in the morning, stretching helps relief some of the pain in the morning,   Relevant Historical Information: HTN, GERD  Additional pertinent review of systems negative.   Current Outpatient Medications:    albuterol (VENTOLIN HFA) 108 (90 Base) MCG/ACT inhaler, INHALE 1-2 PUFFS BY MOUTH EVERY 6 HOURS AS NEEDED FOR WHEEZE OR SHORTNESS OF  BREATH, Disp: 8.5 each, Rfl: 1   amLODipine (NORVASC) 5 MG tablet, TAKE 1 TABLET (5 MG TOTAL) BY MOUTH DAILY., Disp: 90 tablet, Rfl: 2   Ascorbic Acid (VITAMIN C) 1000 MG tablet, Take 1,000 mg by mouth daily., Disp: , Rfl:    atorvastatin (LIPITOR) 20 MG tablet, Take 1 tablet (20 mg total) by mouth daily., Disp: 90 tablet, Rfl: 3   cholecalciferol (VITAMIN D3) 25 MCG (1000 UNIT) tablet, Take 1,000 Units by mouth daily., Disp: , Rfl:    clobetasol ointment (TEMOVATE) 5.78 %, Apply 1 application topically 2 (two) times a week. Initial dose: twice daily x 14 days, then decrease to daily x 14 days, then every other day x 14 days, then twice weekly for duration of use, Disp: 45 g, Rfl: 1   fluticasone (FLONASE) 50 MCG/ACT nasal spray, Place 1 spray into both nostrils 2 (two) times daily as needed for allergies or rhinitis., Disp: 16 g, Rfl: 5   gabapentin (NEURONTIN) 300 MG capsule, Take 1 capsule (300 mg total) by mouth 3 (three) times daily as needed., Disp: 90 capsule, Rfl: 3   hydrochlorothiazide (HYDRODIURIL) 25 MG tablet, TAKE 1 TABLET (25 MG TOTAL) BY MOUTH DAILY., Disp: 90 tablet, Rfl: 2   ibuprofen (ADVIL) 600 MG tablet, Take 1 tablet (600 mg total) by mouth every 6 (six) hours as needed for moderate pain., Disp: 60 tablet, Rfl: 0   meclizine (ANTIVERT) 25 MG tablet, TAKE 1 TABLET (25 MG TOTAL)  BY MOUTH DAILY AS NEEDED FOR DIZZINESS., Disp: 30 tablet, Rfl: 2   metoprolol tartrate (LOPRESSOR) 25 MG tablet, TAKE 0.5 TABLETS BY MOUTH 2 TIMES DAILY., Disp: 90 tablet, Rfl: 2   nitroGLYCERIN (NITRODUR - DOSED IN MG/24 HR) 0.2 mg/hr patch, PLACE 1/4 TO 1/2 OF A PATCH OVER AFFECTED REGION. REMOVE AND REPLACE ONCE DAILY. SLIGHTLY ALTER SKIN PLACEMENT DAILY, Disp: 30 patch, Rfl: 1   pantoprazole (PROTONIX) 40 MG tablet, Take 1 tablet (40 mg total) by mouth 2 (two) times daily. Please schedule a yearly follow up for further refills. Thank you, Disp: 60 tablet, Rfl: 0   Spacer/Aero-Holding Chambers  (AEROCHAMBER PLUS) inhaler, Use as instructed to use with inahaler., Disp: 1 each, Rfl: 1   sucralfate (CARAFATE) 1 g tablet, Take 1 tablet (1 g total) by mouth every 6 (six) hours as needed. Slowly dissolve 1 tablet in 1 Tablespoon of distilled water before ingesting, Disp: 60 tablet, Rfl: 3   triamcinolone cream (KENALOG) 0.1 %, APPLY 1 APPLICATION TOPICALLY 2 (TWO) TIMES DAILY AS NEEDED., Disp: 45 g, Rfl: 1   valACYclovir (VALTREX) 500 MG tablet, 1 TAB TWICE DAILY FOR 3 DAYS WITH OUTBREAKS, START WITHIN 48 HOURS AFTER ONSET., Disp: 18 tablet, Rfl: 1   Objective:     Vitals:   01/12/22 1055  BP: 132/82  Pulse: (!) 57  SpO2: 99%  Weight: 258 lb (117 kg)  Height: '5\' 10"'$  (1.778 m)      Body mass index is 37.02 kg/m.    Physical Exam:    Gen: Appears well, nad, nontoxic and pleasant Psych: Alert and oriented, appropriate mood and affect Neuro: sensation intact, strength is 5/5 in upper and lower extremities, muscle tone wnl Skin: no susupicious lesions or rashes  Back - Normal skin, Spine with normal alignment and no deformity.     tenderness to lumbar vertebral process palpation.   Bilateral lumbar paraspinous muscles are moderately tender, worse on right compared to left, and without spasm Mild TTP gluteal musculature Straight leg raise negative on left, positive on right Trendelenberg positive on left Piriformis Test positive on right   Electronically signed by:  Benito Mccreedy D.[facility] 11:14 AM 01/12/22

## 2022-01-12 NOTE — Patient Instructions (Addendum)
Good to see you - Start meloxicam 15 mg daily x2 weeks.  If still having pain after 2 weeks, complete 3rd-week of meloxicam. May use remaining meloxicam as needed once daily for pain control.  Do not to use additional NSAIDs while taking meloxicam.  May use Tylenol [phone] mg 2 to 3 times a day for breakthrough pain. Low back HEP  Flexeril 5-10 mg nightly as needed for muscle spasm  3 week follow up

## 2022-01-13 NOTE — Progress Notes (Signed)
This encounter was created in error - please disregard.

## 2022-01-14 ENCOUNTER — Other Ambulatory Visit: Payer: Self-pay | Admitting: Family Medicine

## 2022-01-14 DIAGNOSIS — M545 Low back pain, unspecified: Secondary | ICD-10-CM

## 2022-01-23 ENCOUNTER — Other Ambulatory Visit: Payer: Self-pay

## 2022-01-23 ENCOUNTER — Telehealth: Payer: Self-pay | Admitting: Gastroenterology

## 2022-01-23 MED ORDER — PANTOPRAZOLE SODIUM 40 MG PO TBEC: 40.0000 mg | DELAYED_RELEASE_TABLET | Freq: Two times a day (BID) | ORAL | 0 refills | Status: DC

## 2022-01-23 NOTE — Telephone Encounter (Signed)
Pantoprazole was sent to patients pharmacy. Just enough to last her until her appointment.

## 2022-01-23 NOTE — Telephone Encounter (Signed)
Patient scheduled for f/u 11/16 at 3:00 pm with a PA. Patient states she has been out of Pantoprazole prescription for 3 weeks . Can we please refill until next ov. Please advise.  Thank you

## 2022-01-29 NOTE — Progress Notes (Signed)
Benito Mccreedy D.[place] Phone: [phone]   Assessment and Plan:     1. Chronic bilateral low back pain with right-sided sciatica  2. DDD (degenerative disc disease), lumbar -Chronic with exacerbation, subsequent visit - Consistent with flare of lumbar DDD causing right-sided lumbar radiculopathy and low back pain - Due to consistent low back pain affecting day-to-day activities, pain frequently flaring to >6/10, failure to improve with conservative therapy including NSAID course, muscle relaxer course, HEP, and degenerative changes throughout lumbar spine seen on x-ray from 08/02/2020, felt is necessary to further evaluate with lumbar MRI - Continuing complete meloxicam course -- Start Tylenol 500 to 1000 mg tablets 2-3 times a day for day-to-day pain relief -Continue Flexeril 5 to 10 mg nightly as needed for muscle spasms -Due to acute pain, patient elected for methylprednisone 80 mg/ketorolac 60 mg IM injection - MR Lumbar Spine Wo Contrast; Future - ketorolac (TORADOL) injection 60 mg - methylPREDNISolone acetate (DEPO-MEDROL) injection 80 mg  Other orders - cyclobenzaprine (FLEXERIL) 5 MG tablet; Take 1 tablet (5 mg total) by mouth at bedtime.    Pertinent previous records reviewed include lumbar x-ray 08/02/2020   Follow Up: 3 to's after MRI to review results and discuss treatment plan which could include epidural injection based on results   Subjective:   I, Moenique Parris, am serving as a Education administrator for Doctor Glennon Mac   Chief Complaint: back pain    HPI:    01/12/22 Patient is a 66 year old female complaining of back pain. Patient states low back pain , been going on for a couple of month but wasn't as bad , has pain when standing , is fine when sitting, no MOI, has always had back pain when standing, has been taking ib and tylenol for the pain and that does seem to help some, but the  relief doesn't last long, pain radiates down the side of her leg , no numbness or tingling, pain is really bad in the morning, stretching helps relief some of the pain in the morning,   01/30/2022 Patient states she is good when she is sitting, but standing and walking is very painful     Relevant Historical Information: HTN, GERD  Additional pertinent review of systems negative.   Current Outpatient Medications:    albuterol (VENTOLIN HFA) 108 (90 Base) MCG/ACT inhaler, INHALE 1-2 PUFFS BY MOUTH EVERY 6 HOURS AS NEEDED FOR WHEEZE OR SHORTNESS OF BREATH, Disp: 8.5 each, Rfl: 1   amLODipine (NORVASC) 5 MG tablet, TAKE 1 TABLET (5 MG TOTAL) BY MOUTH DAILY., Disp: 90 tablet, Rfl: 2   Ascorbic Acid (VITAMIN C) 1000 MG tablet, Take 1,000 mg by mouth daily., Disp: , Rfl:    atorvastatin (LIPITOR) 20 MG tablet, Take 1 tablet (20 mg total) by mouth daily., Disp: 90 tablet, Rfl: 3   cholecalciferol (VITAMIN D3) 25 MCG (1000 UNIT) tablet, Take 1,000 Units by mouth daily., Disp: , Rfl:    clobetasol ointment (TEMOVATE) 1.02 %, Apply 1 application topically 2 (two) times a week. Initial dose: twice daily x 14 days, then decrease to daily x 14 days, then every other day x 14 days, then twice weekly for duration of use, Disp: 45 g, Rfl: 1   fluticasone (FLONASE) 50 MCG/ACT nasal spray, Place 1 spray into both nostrils 2 (two) times daily as needed for allergies or rhinitis., Disp: 16 g, Rfl: 5   gabapentin (NEURONTIN) 300 MG  capsule, Take 1 capsule (300 mg total) by mouth 3 (three) times daily as needed., Disp: 90 capsule, Rfl: 3   hydrochlorothiazide (HYDRODIURIL) 25 MG tablet, TAKE 1 TABLET (25 MG TOTAL) BY MOUTH DAILY., Disp: 90 tablet, Rfl: 2   ibuprofen (ADVIL) 600 MG tablet, Take 1 tablet (600 mg total) by mouth every 6 (six) hours as needed for moderate pain., Disp: 60 tablet, Rfl: 0   meclizine (ANTIVERT) 25 MG tablet, TAKE 1 TABLET (25 MG TOTAL) BY MOUTH DAILY AS NEEDED FOR DIZZINESS., Disp: 30  tablet, Rfl: 2   meloxicam (MOBIC) 15 MG tablet, Take 1 tablet (15 mg total) by mouth daily., Disp: 30 tablet, Rfl: 0   metoprolol tartrate (LOPRESSOR) 25 MG tablet, TAKE 0.5 TABLETS BY MOUTH 2 TIMES DAILY., Disp: 90 tablet, Rfl: 2   nitroGLYCERIN (NITRODUR - DOSED IN MG/24 HR) 0.2 mg/hr patch, PLACE 1/4 TO 1/2 OF A PATCH OVER AFFECTED REGION. REMOVE AND REPLACE ONCE DAILY. SLIGHTLY ALTER SKIN PLACEMENT DAILY, Disp: 30 patch, Rfl: 1   pantoprazole (PROTONIX) 40 MG tablet, Take 1 tablet (40 mg total) by mouth 2 (two) times daily. Please schedule a yearly follow up for further refills. Thank you, Disp: 60 tablet, Rfl: 0   Spacer/Aero-Holding Chambers (AEROCHAMBER PLUS) inhaler, Use as instructed to use with inahaler., Disp: 1 each, Rfl: 1   sucralfate (CARAFATE) 1 g tablet, Take 1 tablet (1 g total) by mouth every 6 (six) hours as needed. Slowly dissolve 1 tablet in 1 Tablespoon of distilled water before ingesting, Disp: 60 tablet, Rfl: 3   triamcinolone cream (KENALOG) 0.1 %, APPLY 1 APPLICATION TOPICALLY 2 (TWO) TIMES DAILY AS NEEDED., Disp: 45 g, Rfl: 1   valACYclovir (VALTREX) 500 MG tablet, 1 TAB TWICE DAILY FOR 3 DAYS WITH OUTBREAKS, START WITHIN 48 HOURS AFTER ONSET., Disp: 18 tablet, Rfl: 1   cyclobenzaprine (FLEXERIL) 5 MG tablet, Take 1 tablet (5 mg total) by mouth at bedtime., Disp: 20 tablet, Rfl: 0   Objective:     Vitals:   01/30/22 1320  BP: 130/80  Pulse: (!) 51  SpO2: 99%  Weight: 258 lb (117 kg)  Height: '5\' 10"'$  (1.778 m)      Body mass index is 37.02 kg/m.    Physical Exam:    Gen: Appears well, nad, nontoxic and pleasant Psych: Alert and oriented, appropriate mood and affect Neuro: sensation intact, strength is 5/5 in upper and lower extremities, muscle tone wnl Skin: no susupicious lesions or rashes   Back - Normal skin, Spine with normal alignment and no deformity.     tenderness to lumbar vertebral process palpation.   Bilateral lumbar paraspinous muscles are  moderately tender, worse on right compared to left, and without spasm Mild TTP gluteal musculature Straight leg raise negative on left, positive on right Trendelenberg positive on left Piriformis Test positive on right   Electronically signed by:  Benito Mccreedy D.[facility] 1:56 PM 01/30/22

## 2022-01-30 ENCOUNTER — Ambulatory Visit: Payer: 59 | Admitting: Sports Medicine

## 2022-01-30 ENCOUNTER — Ambulatory Visit (INDEPENDENT_AMBULATORY_CARE_PROVIDER_SITE_OTHER): Payer: 59 | Admitting: Sports Medicine

## 2022-01-30 VITALS — BP 130/80 | HR 51 | Ht 70.0 in | Wt 258.0 lb

## 2022-01-30 DIAGNOSIS — M5441 Lumbago with sciatica, right side: Secondary | ICD-10-CM | POA: Diagnosis not present

## 2022-01-30 DIAGNOSIS — G8929 Other chronic pain: Secondary | ICD-10-CM | POA: Diagnosis not present

## 2022-01-30 DIAGNOSIS — M5136 Other intervertebral disc degeneration, lumbar region: Secondary | ICD-10-CM

## 2022-01-30 MED ORDER — METHYLPREDNISOLONE ACETATE 80 MG/ML IJ SUSP
80.0000 mg | Freq: Once | INTRAMUSCULAR | Status: AC
  Administered 2022-01-30: 80 mg via INTRAMUSCULAR

## 2022-01-30 MED ORDER — KETOROLAC TROMETHAMINE 60 MG/2ML IM SOLN
60.0000 mg | Freq: Once | INTRAMUSCULAR | Status: AC
  Administered 2022-01-30: 60 mg via INTRAMUSCULAR

## 2022-01-30 MED ORDER — CYCLOBENZAPRINE HCL 5 MG PO TABS: 5.0000 mg | ORAL_TABLET | Freq: Every day | ORAL | 0 refills | Status: AC

## 2022-01-30 NOTE — Patient Instructions (Addendum)
Good to see you  Lumbar MRI Continue and complete meloxicam Can continue flexeril 5 mg nightly as needed  Follow up 3 days after your MRI to discuss your results

## 2022-02-02 ENCOUNTER — Ambulatory Visit: Payer: 59 | Admitting: Sports Medicine

## 2022-02-08 ENCOUNTER — Other Ambulatory Visit: Payer: Self-pay | Admitting: Sports Medicine

## 2022-02-09 ENCOUNTER — Telehealth: Payer: Self-pay | Admitting: Sports Medicine

## 2022-02-09 ENCOUNTER — Ambulatory Visit (INDEPENDENT_AMBULATORY_CARE_PROVIDER_SITE_OTHER): Payer: 59

## 2022-02-09 DIAGNOSIS — M5441 Lumbago with sciatica, right side: Secondary | ICD-10-CM

## 2022-02-09 DIAGNOSIS — G8929 Other chronic pain: Secondary | ICD-10-CM

## 2022-02-09 MED ORDER — METHYLPREDNISOLONE ACETATE 80 MG/ML IJ SUSP
80.0000 mg | Freq: Once | INTRAMUSCULAR | Status: AC
  Administered 2022-02-09: 80 mg via INTRAMUSCULAR

## 2022-02-09 MED ORDER — KETOROLAC TROMETHAMINE 60 MG/2ML IM SOLN
60.0000 mg | Freq: Once | INTRAMUSCULAR | Status: AC
  Administered 2022-02-09: 60 mg via INTRAMUSCULAR

## 2022-02-09 NOTE — Progress Notes (Signed)
Toradol '60mg'$  Left upper outer Buttock Depomedrol '80mg'$ - Right upper outer buttock  Given per Dr. Glennon Mac. Patient tolerated injections well.

## 2022-02-09 NOTE — Telephone Encounter (Signed)
Patient called stating that she was seen by Dr Glennon Mac on 01/30/2022 and was given a cocktail injection. This helped her pain tremendously but after the change in weather yesterday, her pain has returned.  Okay per Dr Glennon Mac for a nurse visit with Lesly Rubenstein for a full cocktail. Scheduled today.

## 2022-02-16 ENCOUNTER — Other Ambulatory Visit: Payer: Self-pay | Admitting: Family Medicine

## 2022-02-16 DIAGNOSIS — I10 Essential (primary) hypertension: Secondary | ICD-10-CM

## 2022-02-16 DIAGNOSIS — R600 Localized edema: Secondary | ICD-10-CM

## 2022-02-19 NOTE — Progress Notes (Signed)
No Show

## 2022-02-19 NOTE — Progress Notes (Signed)
This encounter was created in error - please disregard.

## 2022-02-22 ENCOUNTER — Ambulatory Visit: Payer: Medicare Other | Admitting: Physician Assistant

## 2022-02-22 ENCOUNTER — Telehealth: Payer: Self-pay | Admitting: Physician Assistant

## 2022-02-22 NOTE — Telephone Encounter (Signed)
Called patient and let her know that she could get refills to last her until her appointment on 04/10/22

## 2022-02-22 NOTE — Telephone Encounter (Signed)
Good Afternoon Brianna Foster,   Patient called stating that she needed to cancel her appointment with you today at 3:00 due to it being her birthday and her medicare has not kick in yet.   Patient was rescheduled for 1/2 at 3:00.

## 2022-02-22 NOTE — Telephone Encounter (Signed)
Patient called to cancel her appointment for today at 3:00 with Ellouise Newer. Patient stated that she still has enough medication for now, but wanted to know if she was to run about before her new appointment on 1/2 at 3:00 if she could more. Please advise.

## 2022-03-14 ENCOUNTER — Other Ambulatory Visit: Payer: Self-pay | Admitting: Sports Medicine

## 2022-03-17 ENCOUNTER — Other Ambulatory Visit: Payer: Self-pay | Admitting: Gastroenterology

## 2022-03-17 ENCOUNTER — Other Ambulatory Visit: Payer: Self-pay | Admitting: Family Medicine

## 2022-03-17 DIAGNOSIS — I10 Essential (primary) hypertension: Secondary | ICD-10-CM

## 2022-03-19 ENCOUNTER — Other Ambulatory Visit: Payer: Self-pay

## 2022-03-19 MED ORDER — PANTOPRAZOLE SODIUM 40 MG PO TBEC: 40.0000 mg | DELAYED_RELEASE_TABLET | Freq: Two times a day (BID) | ORAL | 0 refills | Status: DC

## 2022-03-19 NOTE — Telephone Encounter (Signed)
Inbound call from patient stating her pharmacy denied her prescription refill for pantoprazole. She is requesting refill until her appt on 1/2 and she is going out of town for the holidays.

## 2022-03-19 NOTE — Telephone Encounter (Signed)
Refill has been sent to patient's pharmacy. 

## 2022-03-23 ENCOUNTER — Ambulatory Visit
Admission: EM | Admit: 2022-03-23 | Discharge: 2022-03-23 | Disposition: A | Discharge status: Home / Self Care | Payer: Medicare HMO | Attending: Urgent Care | Admitting: Urgent Care

## 2022-03-23 DIAGNOSIS — R519 Headache, unspecified: Secondary | ICD-10-CM | POA: Diagnosis not present

## 2022-03-23 DIAGNOSIS — R6883 Chills (without fever): Secondary | ICD-10-CM | POA: Diagnosis not present

## 2022-03-23 DIAGNOSIS — H938X3 Other specified disorders of ear, bilateral: Secondary | ICD-10-CM

## 2022-03-23 LAB — POCT INFLUENZA A/B: Influenza A, POC: NEGATIVE

## 2022-03-23 NOTE — ED Provider Notes (Signed)
[facility]    CSN: [id number] Arrival date & time: 03/23/22  0955      History   Chief Complaint Chief Complaint  Patient presents with   Ear Fullness    Ear pressure and chills - Entered by patient    HPI Brianna Foster is a 66 y.o. female.   66 year old female presents today due to concerns of chills, headache, and pressure in her ears.  States symptoms started yesterday.  She denies a fever.  She states "overall I do not feel too bad." Has a graduation ceremony to attend tomorrow and wants to make sure she is well. Has been taking over-the-counter cold and sinus medication.  States her blood pressure usually runs 841 systolic.  Did take a Sudafed prior to appointment today.  Denies any cough.  Denies any rhinorrhea, sore throat, or nasal congestion. Did have some mild nausea yesterday but denies any additional GI symptoms.   Ear Fullness    Past Medical History:  Diagnosis Date   Arthritis    COVID-19 03/2020   had pneumonia   GERD (gastroesophageal reflux disease)    Headache(784.0)    Hypertension    OSA (obstructive sleep apnea) 08/28/2017   uses a Cpap   Pneumonia     Patient Active Problem List   Diagnosis Date Noted   Morbid obesity ([place]) 01/30/2021   Incontinence of urine in female 01/30/2021   Pneumonia due to COVID-19 virus 05/10/2020   Hypokalemia 04/13/2020   Healthcare maintenance 12/24/2018   Rhinitis, allergic 06/23/2018   OSA (obstructive sleep apnea) 08/28/2017   Recurrent genital herpes 07/19/2017   Unstable angina ([place]) 07/11/2017   De Quervain's tenosynovitis, right 05/08/2016   Cervical radiculopathy 05/08/2016   Benign paroxysmal positional vertigo 03/20/2016   Eczema 03/20/2016   Hyperlipidemia, unspecified 03/20/2016   Vertigo 10/28/2015   Headache 10/28/2015   Tobacco abuse 07/23/2012   Gastroesophageal reflux disease 06/18/2011   Essential hypertension 06/18/2011    Past Surgical History:  Procedure Laterality  Date   CERVICAL FUSION     LEFT HEART CATH AND CORONARY ANGIOGRAPHY N/A 07/11/2017   Procedure: LEFT HEART CATH AND CORONARY ANGIOGRAPHY;  Surgeon: Martinique, Peter M, MD;  Location: [place] [facility];  Service: Cardiovascular;  Laterality: N/A;   LEFT HEART CATHETERIZATION WITH CORONARY ANGIOGRAM N/A 05/11/2013   Procedure: LEFT HEART CATHETERIZATION WITH CORONARY ANGIOGRAM;  Surgeon: Burnell Blanks, MD;  Location: [facility];  Service: Cardiovascular;  Laterality: N/A;   neck fusion     RADIOLOGY WITH ANESTHESIA Right 02/28/2021   Procedure: MRI OF THE ELBOW WITHOUT CONTRAST WITH ANESTHESIA;  Surgeon: Radiologist, Medication, MD;  Location: [place];  Service: Radiology;  Laterality: Right;    OB History     Gravida  1   Para  1   Term  1   Preterm      AB  0   Living  1      SAB      IAB      Ectopic  0   Multiple      Live Births               Home Medications    Prior to Admission medications   Medication Sig Start Date End Date Taking? Authorizing Provider  albuterol (VENTOLIN HFA) 108 (90 Base) MCG/ACT inhaler INHALE 1-2 PUFFS BY MOUTH EVERY 6 HOURS AS NEEDED FOR WHEEZE OR SHORTNESS OF BREATH 07/19/21   Martinique, Betty G, MD  amLODipine (NORVASC) 5 MG tablet TAKE 1 TABLET (5 MG TOTAL) BY MOUTH DAILY. 03/19/22   Martinique, Betty G, MD  Ascorbic Acid (VITAMIN C) 1000 MG tablet Take 1,000 mg by mouth daily.    [provider]  cholecalciferol (VITAMIN D3) 25 MCG (1000 UNIT) tablet Take 1,000 Units by mouth daily.    [provider]  clobetasol ointment (TEMOVATE) 5.05 % Apply 1 application topically 2 (two) times a week. Initial dose: twice daily x 14 days, then decrease to daily x 14 days, then every other day x 14 days, then twice weekly for duration of use 04/20/21   Marny Lowenstein A, NP  cyclobenzaprine (FLEXERIL) 5 MG tablet Take 1 tablet (5 mg total) by mouth at bedtime. 01/30/22   Glennon Mac, DO  fluticasone (FLONASE) 50 MCG/ACT  nasal spray Place 1 spray into both nostrils 2 (two) times daily as needed for allergies or rhinitis. 06/20/21   Martinique, Betty G, MD  hydrochlorothiazide (HYDRODIURIL) 25 MG tablet TAKE 1 TABLET (25 MG TOTAL) BY MOUTH DAILY. 02/16/22   Martinique, Betty G, MD  meclizine (ANTIVERT) 25 MG tablet TAKE 1 TABLET (25 MG TOTAL) BY MOUTH DAILY AS NEEDED FOR DIZZINESS. 08/07/21   Martinique, Betty G, MD  metoprolol tartrate (LOPRESSOR) 25 MG tablet TAKE 0.5 TABLETS BY MOUTH 2 TIMES DAILY. 10/18/21   Martinique, Betty G, MD  nitroGLYCERIN (NITRODUR - DOSED IN MG/24 HR) 0.2 mg/hr patch PLACE 1/4 TO 1/2 OF A PATCH OVER AFFECTED REGION. REMOVE AND REPLACE ONCE DAILY. SLIGHTLY ALTER SKIN PLACEMENT DAILY 08/16/21   Gregor Hams, MD  pantoprazole (PROTONIX) 40 MG tablet Take 1 tablet (40 mg total) by mouth 2 (two) times daily. Please schedule a yearly follow up for further refills. Thank you 03/19/22   Levin Erp, PA  Spacer/Aero-Holding Chambers (AEROCHAMBER PLUS) inhaler Use as instructed to use with inahaler. 09/02/20   Martinique, Betty G, MD  sucralfate (CARAFATE) 1 g tablet Take 1 tablet (1 g total) by mouth every 6 (six) hours as needed. Slowly dissolve 1 tablet in 1 Tablespoon of distilled water before ingesting 06/02/20   Armbruster, Carlota Raspberry, MD  triamcinolone cream (KENALOG) 0.1 % APPLY 1 APPLICATION TOPICALLY 2 (TWO) TIMES DAILY AS NEEDED. 02/27/21   Burchette, Alinda Sierras, MD  valACYclovir (VALTREX) 500 MG tablet 1 TAB TWICE DAILY FOR 3 DAYS WITH OUTBREAKS, START WITHIN 48 HOURS AFTER ONSET. 06/22/21   Martinique, Betty G, MD    Family History Family History  Problem Relation Age of Onset   Lung cancer Mother    Stomach cancer Father    Heart disease Brother        Pacemaker   Esophageal cancer Brother    Breast cancer Sister    Colon cancer Neg Hx    Pancreatic cancer Neg Hx    Liver disease Neg Hx    Rectal cancer Neg Hx     Social History Social History   Tobacco Use   Smoking status: Former     Packs/day: 0.25    Years: 16.00    Total pack years: 4.00    Types: Cigarettes    Quit date: 03/09/2018    Years since quitting: 4.0   Smokeless tobacco: Never  Vaping Use   Vaping Use: Never used  Substance Use Topics   Alcohol use: Yes    Comment: socially   Drug use: No     Allergies   Other   Review of Systems Review of Systems As per  HPI  Physical Exam Triage Vital Signs ED Triage Vitals  Enc Vitals Group     BP 03/23/22 1151 (!) 168/80     Pulse Rate 03/23/22 1151 (!) 56     Resp 03/23/22 1151 20     Temp 03/23/22 1151 97.8 F (36.6 C)     Temp Source 03/23/22 1151 Oral     SpO2 03/23/22 1151 96 %     Weight --      Height --      Head Circumference --      Peak Flow --      Pain Score 03/23/22 1150 0     Pain Loc --      Pain Edu? --      Excl. in [place]? --    No data found.  Updated Vital Signs BP (!) 168/80 (BP Location: Right Arm)   Pulse (!) 56   Temp 97.8 F (36.6 C) (Oral)   Resp 20   LMP 01/07/2010 (LMP Unknown)   SpO2 96%   Visual Acuity Right Eye Distance:   Left Eye Distance:   Bilateral Distance:    Right Eye Near:   Left Eye Near:    Bilateral Near:     Physical Exam Vitals and nursing note reviewed.  Constitutional:      General: She is not in acute distress.    Appearance: Normal appearance. She is well-developed. She is not ill-appearing, toxic-appearing or diaphoretic.  HENT:     Head: Normocephalic and atraumatic.     Right Ear: Tympanic membrane, ear canal and external ear normal. There is no impacted cerumen.     Left Ear: Tympanic membrane, ear canal and external ear normal. There is no impacted cerumen.     Nose: Nose normal. No congestion or rhinorrhea.     Mouth/Throat:     Mouth: Mucous membranes are moist.     Pharynx: Oropharynx is clear. No oropharyngeal exudate or posterior oropharyngeal erythema.  Eyes:     General: No scleral icterus.       Right eye: No discharge.        Left eye: No discharge.      Extraocular Movements: Extraocular movements intact.     Conjunctiva/sclera: Conjunctivae normal.     Pupils: Pupils are equal, round, and reactive to light.  Cardiovascular:     Rate and Rhythm: Normal rate and regular rhythm.     Pulses: Normal pulses.     Heart sounds: No murmur heard. Pulmonary:     Effort: Pulmonary effort is normal. No respiratory distress.     Breath sounds: Normal breath sounds. No stridor. No wheezing, rhonchi or rales.  Chest:     Chest wall: No tenderness.  Abdominal:     Palpations: Abdomen is soft.     Tenderness: There is no abdominal tenderness.  Musculoskeletal:        General: No swelling.     Cervical back: Normal range of motion and neck supple. No rigidity or tenderness.  Lymphadenopathy:     Cervical: No cervical adenopathy.  Skin:    General: Skin is warm and dry.     Capillary Refill: Capillary refill takes less than 2 seconds.     Coloration: Skin is not jaundiced.     Findings: No bruising, erythema or rash.  Neurological:     General: No focal deficit present.     Mental Status: She is alert and oriented to person, place, and time.  Psychiatric:  Mood and Affect: Mood normal.      UC Treatments / Results  Labs (all labs ordered are listed, but only abnormal results are displayed) Labs Reviewed  POCT INFLUENZA A/B    EKG   Radiology No results found.  Procedures Procedures (including critical care time)  Medications Ordered in UC Medications - No data to display  Initial Impression / Assessment and Plan / UC Course  I have reviewed the triage vital signs and the nursing notes.  Pertinent labs & imaging results that were available during my care of the patient were reviewed by me and considered in my medical decision making (see chart for details).     Chills -flu test negative.  Question this possibly parainfluenza or some other viral illness.  Patient appears well on clinical exam. Headache -viral.   Encouraged patient to monitor blood pressure as well.  Stop taking Sudafed Pressure in ears -patient has a prescription for Flonase at home.  Recommended she continue use of this as this may help.  Final Clinical Impressions(s) / UC Diagnoses   Final diagnoses:  Chills  Acute nonintractable headache, unspecified headache type  Pressure sensation in both ears     Discharge Instructions      Your flu test was negative. Your symptoms are consistent with a viral illness. Increase your water intake and REST! May try OTC oscillococcinum for body aches. Try flonase to help with the ear pressure. Alternating tylenol and ibuprofen may also help with fevers and body aches. RTC for any new or worsening symptoms.      ED Prescriptions   None    PDMP not reviewed this encounter.   Chaney Malling, [place] 03/23/22 1302

## 2022-03-23 NOTE — Discharge Instructions (Addendum)
Your flu test was negative. Your symptoms are consistent with a viral illness. Increase your water intake and REST! May try OTC oscillococcinum for body aches. Try flonase to help with the ear pressure. Alternating tylenol and ibuprofen may also help with fevers and body aches. RTC for any new or worsening symptoms.

## 2022-03-23 NOTE — ED Triage Notes (Signed)
Pt c/o bilat ear fullness/sinus pressure and chills started yesterday-NAD-steady gait

## 2022-03-28 ENCOUNTER — Ambulatory Visit (INDEPENDENT_AMBULATORY_CARE_PROVIDER_SITE_OTHER): Payer: Medicare HMO | Admitting: Sports Medicine

## 2022-03-28 VITALS — HR 61 | Ht 70.0 in | Wt 253.0 lb

## 2022-03-28 DIAGNOSIS — G8929 Other chronic pain: Secondary | ICD-10-CM | POA: Diagnosis not present

## 2022-03-28 DIAGNOSIS — M5136 Other intervertebral disc degeneration, lumbar region: Secondary | ICD-10-CM | POA: Diagnosis not present

## 2022-03-28 DIAGNOSIS — M5441 Lumbago with sciatica, right side: Secondary | ICD-10-CM | POA: Diagnosis not present

## 2022-03-28 MED ORDER — KETOROLAC TROMETHAMINE 60 MG/2ML IM SOLN
60.0000 mg | Freq: Once | INTRAMUSCULAR | Status: AC
  Administered 2022-03-28: 60 mg via INTRAMUSCULAR

## 2022-03-28 MED ORDER — METHYLPREDNISOLONE ACETATE 80 MG/ML IJ SUSP
80.0000 mg | Freq: Once | INTRAMUSCULAR | Status: AC
  Administered 2022-03-28: 80 mg via INTRAMUSCULAR

## 2022-03-28 NOTE — Progress Notes (Signed)
Brianna Foster D.[place] Phone: [phone]   Assessment and Plan:     1. Chronic bilateral low back pain with right-sided sciatica 2. DDD (degenerative disc disease), lumbar  -Chronic with exacerbation, subsequent visit - Consistent with recurrent flare of lumbar DDD causing right-sided lumbar pain and radicular symptoms. - Symptoms have been treated well in the past with IM injection ketorolac/methylprednisone, so Patient elected for repeat IM injection of methylprednisone 80 mg/Toradol 60 mg.  Injection given in clinic today and tolerated well. -Continue Tylenol as needed for day-to-day pain - May use naproxen 220 mg 2 tablets twice a day as needed for breakthrough pain - Due to failure to improve with >6 weeks of conservative therapy, pain at times flaring to >6/10, degenerative changes seen on lumbar x-ray, I feel it is necessary for patient to be further evaluated with lumbar MRI.  Patient's insurance is changed, so she says she will reach out to [facility] imaging center to schedule MRI at this time.  If she needs a new order, she can contact our clinic and we can put in a new MRI order for lumbar spine MRI without contrast  Pertinent previous records reviewed include none   Follow Up: 3 days after MRI to review results   Subjective:   I, Pincus Badder, am serving as a Education administrator for Doctor Glennon Mac   Chief Complaint: back pain    HPI:    01/12/22 Patient is a 66 year old female complaining of back pain. Patient states low back pain , been going on for a couple of month but wasn't as bad , has pain when standing , is fine when sitting, no MOI, has always had back pain when standing, has been taking ib and tylenol for the pain and that does seem to help some, but the relief doesn't last long, pain radiates down the side of her leg , no numbness or tingling, pain is really bad in the morning,  stretching helps relief some of the pain in the morning,    01/30/2022 Patient states she is good when she is sitting, but standing and walking is very painful    03/28/2022 Patient states she wants a cocktail and MRI     Relevant Historical Information: HTN, GERD  Additional pertinent review of systems negative.   Current Outpatient Medications:    albuterol (VENTOLIN HFA) 108 (90 Base) MCG/ACT inhaler, INHALE 1-2 PUFFS BY MOUTH EVERY 6 HOURS AS NEEDED FOR WHEEZE OR SHORTNESS OF BREATH, Disp: 8.5 each, Rfl: 1   amLODipine (NORVASC) 5 MG tablet, TAKE 1 TABLET (5 MG TOTAL) BY MOUTH DAILY., Disp: 90 tablet, Rfl: 1   Ascorbic Acid (VITAMIN C) 1000 MG tablet, Take 1,000 mg by mouth daily., Disp: , Rfl:    cholecalciferol (VITAMIN D3) 25 MCG (1000 UNIT) tablet, Take 1,000 Units by mouth daily., Disp: , Rfl:    clobetasol ointment (TEMOVATE) 3.61 %, Apply 1 application topically 2 (two) times a week. Initial dose: twice daily x 14 days, then decrease to daily x 14 days, then every other day x 14 days, then twice weekly for duration of use, Disp: 45 g, Rfl: 1   cyclobenzaprine (FLEXERIL) 5 MG tablet, Take 1 tablet (5 mg total) by mouth at bedtime., Disp: 20 tablet, Rfl: 0   fluticasone (FLONASE) 50 MCG/ACT nasal spray, Place 1 spray into both nostrils 2 (two) times daily as needed for allergies or rhinitis., Disp: 16  g, Rfl: 5   hydrochlorothiazide (HYDRODIURIL) 25 MG tablet, TAKE 1 TABLET (25 MG TOTAL) BY MOUTH DAILY., Disp: 90 tablet, Rfl: 1   meclizine (ANTIVERT) 25 MG tablet, TAKE 1 TABLET (25 MG TOTAL) BY MOUTH DAILY AS NEEDED FOR DIZZINESS., Disp: 30 tablet, Rfl: 2   metoprolol tartrate (LOPRESSOR) 25 MG tablet, TAKE 0.5 TABLETS BY MOUTH 2 TIMES DAILY., Disp: 90 tablet, Rfl: 2   nitroGLYCERIN (NITRODUR - DOSED IN MG/24 HR) 0.2 mg/hr patch, PLACE 1/4 TO 1/2 OF A PATCH OVER AFFECTED REGION. REMOVE AND REPLACE ONCE DAILY. SLIGHTLY ALTER SKIN PLACEMENT DAILY, Disp: 30 patch, Rfl: 1    pantoprazole (PROTONIX) 40 MG tablet, Take 1 tablet (40 mg total) by mouth 2 (two) times daily. Please schedule a yearly follow up for further refills. Thank you, Disp: 60 tablet, Rfl: 0   Spacer/Aero-Holding Chambers (AEROCHAMBER PLUS) inhaler, Use as instructed to use with inahaler., Disp: 1 each, Rfl: 1   sucralfate (CARAFATE) 1 g tablet, Take 1 tablet (1 g total) by mouth every 6 (six) hours as needed. Slowly dissolve 1 tablet in 1 Tablespoon of distilled water before ingesting, Disp: 60 tablet, Rfl: 3   triamcinolone cream (KENALOG) 0.1 %, APPLY 1 APPLICATION TOPICALLY 2 (TWO) TIMES DAILY AS NEEDED., Disp: 45 g, Rfl: 1   valACYclovir (VALTREX) 500 MG tablet, 1 TAB TWICE DAILY FOR 3 DAYS WITH OUTBREAKS, START WITHIN 48 HOURS AFTER ONSET., Disp: 18 tablet, Rfl: 1   Objective:     Vitals:   03/28/22 1103  Pulse: 61  SpO2: 95%  Weight: 253 lb (114.8 kg)  Height: '5\' 10"'$  (1.778 m)      Body mass index is 36.3 kg/m.    Physical Exam:    Gen: Appears well, nad, nontoxic and pleasant Psych: Alert and oriented, appropriate mood and affect Neuro: sensation intact, strength is 5/5 in upper and lower extremities, muscle tone wnl Skin: no susupicious lesions or rashes   Back - Normal skin, Spine with normal alignment and no deformity.     tenderness to lumbar vertebral process palpation.   Bilateral lumbar paraspinous muscles are moderately tender, worse on right compared to left, and without spasm Mild TTP gluteal musculature Straight leg raise negative on left, positive on right Trendelenberg positive on left Piriformis Test positive on right    Electronically signed by:  Brianna Foster D.[facility] 11:11 AM 03/28/22

## 2022-03-28 NOTE — Patient Instructions (Addendum)
Good to see you  [phone]  MRI Jule Ser

## 2022-04-10 ENCOUNTER — Ambulatory Visit: Payer: Medicare Other | Admitting: Physician Assistant

## 2022-04-11 ENCOUNTER — Ambulatory Visit
Admission: RE | Admit: 2022-04-11 | Discharge: 2022-04-11 | Disposition: A | Discharge status: Home / Self Care | Payer: Medicare HMO | Source: Ambulatory Visit | Attending: Physician Assistant | Admitting: Physician Assistant

## 2022-04-11 ENCOUNTER — Telehealth: Payer: Self-pay

## 2022-04-11 VITALS — BP 135/83 | HR 62 | Temp 98.2°F | Resp 16

## 2022-04-11 DIAGNOSIS — Z1152 Encounter for screening for COVID-19: Secondary | ICD-10-CM | POA: Insufficient documentation

## 2022-04-11 DIAGNOSIS — Z8616 Personal history of COVID-19: Secondary | ICD-10-CM | POA: Diagnosis not present

## 2022-04-11 DIAGNOSIS — R059 Cough, unspecified: Secondary | ICD-10-CM | POA: Insufficient documentation

## 2022-04-11 DIAGNOSIS — B9789 Other viral agents as the cause of diseases classified elsewhere: Secondary | ICD-10-CM | POA: Insufficient documentation

## 2022-04-11 DIAGNOSIS — J329 Chronic sinusitis, unspecified: Secondary | ICD-10-CM | POA: Diagnosis not present

## 2022-04-11 DIAGNOSIS — J069 Acute upper respiratory infection, unspecified: Secondary | ICD-10-CM | POA: Diagnosis not present

## 2022-04-11 DIAGNOSIS — Z87891 Personal history of nicotine dependence: Secondary | ICD-10-CM | POA: Insufficient documentation

## 2022-04-11 MED ORDER — PROMETHAZINE-DM 6.25-15 MG/5ML PO SYRP: 5.0000 mL | ORAL_SOLUTION | Freq: Every evening | ORAL | 0 refills | Status: AC | PRN

## 2022-04-11 MED ORDER — FLUTICASONE PROPIONATE 50 MCG/ACT NA SUSP: 1.0000 | Freq: Every day | NASAL | 0 refills | Status: AC

## 2022-04-11 MED ORDER — CETIRIZINE HCL 10 MG PO TABS: 10.0000 mg | ORAL_TABLET | Freq: Every day | ORAL | 0 refills | Status: AC

## 2022-04-11 NOTE — Discharge Instructions (Signed)
Monitor your MyChart for your results.  We will contact you if you are positive for COVID.  Make sure that you are resting and drinking plenty of fluid.  Use cetirizine at night.  Use Flonase 1 spray in each nostril daily.  Use Promethazine DM for cough at night.  This make you sleepy so do not drive or drink alcohol with taking it.  You can use over-the-counter medication including Mucinex.  I also recommend nasal saline and sinus rinses.  If your symptoms are improving by next week please return for reevaluation.  If anything worsens please be seen immediately.

## 2022-04-11 NOTE — ED Provider Notes (Signed)
[facility]    CSN: [id number] Arrival date & time: 04/11/22  1344      History   Chief Complaint Chief Complaint  Patient presents with   Headache    I am having severe sinus problems - Entered by patient   Nasal Congestion   Facial Pain    HPI Brianna Foster is a 67 y.o. female.   Patient presents today with a 2 to 3-day history of URI symptoms including sinus pressure, cough, bilateral otalgia, headache.  Denies any chest pain, shortness of breath, nausea, vomiting, fever.  She has tried over-the-counter medications Mucinex and Tylenol without improvement of symptoms.  She denies any known sick contacts.  She has had COVID several years ago.  She has been vaccinated and had recent booster.  She denies any recent antibiotics or steroids.  She did take an at-home COVID test that was negative.  She denies history of asthma or COPD.  She is a former smoker but quit many years ago.    Past Medical History:  Diagnosis Date   Arthritis    COVID-19 03/2020   had pneumonia   GERD (gastroesophageal reflux disease)    Headache(784.0)    Hypertension    OSA (obstructive sleep apnea) 08/28/2017   uses a Cpap   Pneumonia     Patient Active Problem List   Diagnosis Date Noted   Morbid obesity ([place]) 01/30/2021   Incontinence of urine in female 01/30/2021   Pneumonia due to COVID-19 virus 05/10/2020   Hypokalemia 04/13/2020   Healthcare maintenance 12/24/2018   Rhinitis, allergic 06/23/2018   OSA (obstructive sleep apnea) 08/28/2017   Recurrent genital herpes 07/19/2017   Unstable angina ([place]) 07/11/2017   De Quervain's tenosynovitis, right 05/08/2016   Cervical radiculopathy 05/08/2016   Benign paroxysmal positional vertigo 03/20/2016   Eczema 03/20/2016   Hyperlipidemia, unspecified 03/20/2016   Vertigo 10/28/2015   Headache 10/28/2015   Tobacco abuse 07/23/2012   Gastroesophageal reflux disease 06/18/2011   Essential hypertension 06/18/2011    Past  Surgical History:  Procedure Laterality Date   CERVICAL FUSION     LEFT HEART CATH AND CORONARY ANGIOGRAPHY N/A 07/11/2017   Procedure: LEFT HEART CATH AND CORONARY ANGIOGRAPHY;  Surgeon: Martinique, Peter M, MD;  Location: [facility];  Service: Cardiovascular;  Laterality: N/A;   LEFT HEART CATHETERIZATION WITH CORONARY ANGIOGRAM N/A 05/11/2013   Procedure: LEFT HEART CATHETERIZATION WITH CORONARY ANGIOGRAM;  Surgeon: Burnell Blanks, MD;  Location: [facility] C[facility];  Service: Cardiovascular;  Laterality: N/A;   neck fusion     RADIOLOGY WITH ANESTHESIA Right 02/28/2021   Procedure: MRI OF THE ELBOW WITHOUT CONTRAST WITH ANESTHESIA;  Surgeon: Radiologist, Medication, MD;  Location: [facility];  Service: Radiology;  Laterality: Right;    OB History     Gravida  1   Para  1   Term  1   Preterm      AB  0   Living  1      SAB      IAB      Ectopic  0   Multiple      Live Births               Home Medications    Prior to Admission medications   Medication Sig Start Date End Date Taking? Authorizing Provider  albuterol (VENTOLIN HFA) 108 (90 Base) MCG/ACT inhaler INHALE 1-2 PUFFS BY MOUTH EVERY 6 HOURS AS NEEDED FOR WHEEZE OR SHORTNESS OF  BREATH 07/19/21   Martinique, Betty G, MD  amLODipine (NORVASC) 5 MG tablet TAKE 1 TABLET (5 MG TOTAL) BY MOUTH DAILY. 03/19/22   Martinique, Betty G, MD  Ascorbic Acid (VITAMIN C) 1000 MG tablet Take 1,000 mg by mouth daily.    [provider]  cetirizine (ZYRTEC ALLERGY) 10 MG tablet Take 1 tablet (10 mg total) by mouth at bedtime. 04/11/22  Yes Shifa Brisbon, Derry Skill, PA-C  cholecalciferol (VITAMIN D3) 25 MCG (1000 UNIT) tablet Take 1,000 Units by mouth daily.    [provider]  clobetasol ointment (TEMOVATE) 5.09 % Apply 1 application topically 2 (two) times a week. Initial dose: twice daily x 14 days, then decrease to daily x 14 days, then every other day x 14 days, then twice weekly for duration of use 04/20/21   Marny Lowenstein A, NP  cyclobenzaprine (FLEXERIL) 5 MG tablet Take 1 tablet (5 mg total) by mouth at bedtime. 01/30/22   Glennon Mac, DO  fluticasone (FLONASE) 50 MCG/ACT nasal spray Place 1 spray into both nostrils daily. 04/11/22  Yes Lusine Corlett K, PA-C  hydrochlorothiazide (HYDRODIURIL) 25 MG tablet TAKE 1 TABLET (25 MG TOTAL) BY MOUTH DAILY. 02/16/22   Martinique, Betty G, MD  meclizine (ANTIVERT) 25 MG tablet TAKE 1 TABLET (25 MG TOTAL) BY MOUTH DAILY AS NEEDED FOR DIZZINESS. 08/07/21   Martinique, Betty G, MD  metoprolol tartrate (LOPRESSOR) 25 MG tablet TAKE 0.5 TABLETS BY MOUTH 2 TIMES DAILY. 10/18/21   Martinique, Betty G, MD  nitroGLYCERIN (NITRODUR - DOSED IN MG/24 HR) 0.2 mg/hr patch PLACE 1/4 TO 1/2 OF A PATCH OVER AFFECTED REGION. REMOVE AND REPLACE ONCE DAILY. SLIGHTLY ALTER SKIN PLACEMENT DAILY 08/16/21   Gregor Hams, MD  pantoprazole (PROTONIX) 40 MG tablet Take 1 tablet (40 mg total) by mouth 2 (two) times daily. Please schedule a yearly follow up for further refills. Thank you 03/19/22   Levin Erp, PA  promethazine-dextromethorphan (PROMETHAZINE-DM) 6.25-15 MG/5ML syrup Take 5 mLs by mouth at bedtime as needed for cough. 04/11/22  Yes Enslee Bibbins, Derry Skill, PA-C  Spacer/Aero-Holding Chambers (AEROCHAMBER PLUS) inhaler Use as instructed to use with inahaler. 09/02/20   Martinique, Betty G, MD  sucralfate (CARAFATE) 1 g tablet Take 1 tablet (1 g total) by mouth every 6 (six) hours as needed. Slowly dissolve 1 tablet in 1 Tablespoon of distilled water before ingesting 06/02/20   Armbruster, Carlota Raspberry, MD  triamcinolone cream (KENALOG) 0.1 % APPLY 1 APPLICATION TOPICALLY 2 (TWO) TIMES DAILY AS NEEDED. 02/27/21   Burchette, Alinda Sierras, MD  valACYclovir (VALTREX) 500 MG tablet 1 TAB TWICE DAILY FOR 3 DAYS WITH OUTBREAKS, START WITHIN 48 HOURS AFTER ONSET. 06/22/21   Martinique, Betty G, MD    Family History Family History  Problem Relation Age of Onset   Lung cancer Mother    Stomach cancer Father    Heart  disease Brother        Pacemaker   Esophageal cancer Brother    Breast cancer Sister    Colon cancer Neg Hx    Pancreatic cancer Neg Hx    Liver disease Neg Hx    Rectal cancer Neg Hx     Social History Social History   Tobacco Use   Smoking status: Former    Packs/day: 0.25    Years: 16.00    Total pack years: 4.00    Types: Cigarettes    Quit date: 03/09/2018    Years since quitting: 4.0   Smokeless  tobacco: Never  Vaping Use   Vaping Use: Never used  Substance Use Topics   Alcohol use: Yes    Comment: socially   Drug use: No     Allergies   Other   Review of Systems Review of Systems  Constitutional:  Positive for activity change. Negative for appetite change, fatigue and fever.  HENT:  Positive for congestion, ear pain, postnasal drip and sinus pressure. Negative for sneezing and sore throat.   Respiratory:  Positive for cough. Negative for shortness of breath.   Cardiovascular:  Negative for chest pain.  Gastrointestinal:  Negative for abdominal pain, diarrhea, nausea and vomiting.  Neurological:  Positive for headaches. Negative for dizziness and light-headedness.     Physical Exam Triage Vital Signs ED Triage Vitals  Enc Vitals Group     BP 04/11/22 1411 135/83     Pulse Rate 04/11/22 1411 62     Resp 04/11/22 1411 16     Temp 04/11/22 1411 98.2 F (36.8 C)     Temp Source 04/11/22 1411 Oral     SpO2 04/11/22 1411 96 %     Weight --      Height --      Head Circumference --      Peak Flow --      Pain Score 04/11/22 1410 9     Pain Loc --      Pain Edu? --      Excl. in [place]? --    No data found.  Updated Vital Signs BP 135/83 (BP Location: Right Arm)   Pulse 62   Temp 98.2 F (36.8 C) (Oral)   Resp 16   LMP 01/07/2010 (LMP Unknown)   SpO2 96%   Visual Acuity Right Eye Distance:   Left Eye Distance:   Bilateral Distance:    Right Eye Near:   Left Eye Near:    Bilateral Near:     Physical Exam Vitals reviewed.  Constitutional:       General: She is awake. She is not in acute distress.    Appearance: Normal appearance. She is well-developed. She is not ill-appearing.     Comments: Very pleasant female appears stated age in no acute distress sitting comfortably in exam room  HENT:     Head: Normocephalic and atraumatic.     Right Ear: Tympanic membrane, ear canal and external ear normal. Tympanic membrane is not erythematous or bulging.     Left Ear: Ear canal and external ear normal. A middle ear effusion is present. Tympanic membrane is not erythematous or bulging.     Nose:     Right Sinus: Maxillary sinus tenderness and frontal sinus tenderness present.     Left Sinus: Maxillary sinus tenderness and frontal sinus tenderness present.     Mouth/Throat:     Pharynx: Uvula midline. No oropharyngeal exudate or posterior oropharyngeal erythema.  Cardiovascular:     Rate and Rhythm: Normal rate and regular rhythm.     Heart sounds: Normal heart sounds, S1 normal and S2 normal. No murmur heard. Pulmonary:     Effort: Pulmonary effort is normal.     Breath sounds: Normal breath sounds. No wheezing, rhonchi or rales.     Comments: Clear to auscultation bilaterally Psychiatric:        Behavior: Behavior is cooperative.      UC Treatments / Results  Labs (all labs ordered are listed, but only abnormal results are displayed) Labs Reviewed  SARS CORONAVIRUS 2 (TAT  6-24 HRS)    EKG   Radiology No results found.  Procedures Procedures (including critical care time)  Medications Ordered in UC Medications - No data to display  Initial Impression / Assessment and Plan / UC Course  I have reviewed the triage vital signs and the nursing notes.  Pertinent labs & imaging results that were available during my care of the patient were reviewed by me and considered in my medical decision making (see chart for details).     Patient is well-appearing, afebrile, nontoxic, nontachycardic.  No evidence of acute  infection on physical exam that would warrant initiation of antibiotics.  Flu testing was deferred as she has already been symptomatic for 2 to 3 days and outside the window of effectiveness for Tamiflu.  COVID testing was obtained today and is pending.  She is a candidate for antiviral therapy given her age.  She had a metabolic panel obtained 09/15/6166 with EGFR greater than 60.  Would recommend Paxlovid but she would need to hold her amlodipine while on this medication for 3 days after completing course.  Will use over-the-counter medications to help manage her symptoms including cetirizine and Flonase.  She can use Mucinex as needed for additional symptom relief.  She was prescribed promethazine DM.  Discussed that this can be sedating and she is only to use it at night.  Discussed that if her symptoms are not improving within a week she is to return for reevaluation.  If she has any worsening or changing symptoms including high fever, chest pain, shortness of breath, nausea/vomiting she needs to be seen immediately.  Strict return precautions given.  Work excuse note with current CDC return to work guidelines based on COVID test result provided during visit.  Final Clinical Impressions(s) / UC Diagnoses   Final diagnoses:  Upper respiratory tract infection, unspecified type  Viral sinusitis     Discharge Instructions      Monitor your MyChart for your results.  We will contact you if you are positive for COVID.  Make sure that you are resting and drinking plenty of fluid.  Use cetirizine at night.  Use Flonase 1 spray in each nostril daily.  Use Promethazine DM for cough at night.  This make you sleepy so do not drive or drink alcohol with taking it.  You can use over-the-counter medication including Mucinex.  I also recommend nasal saline and sinus rinses.  If your symptoms are improving by next week please return for reevaluation.  If anything worsens please be seen immediately.     ED  Prescriptions     Medication Sig Dispense Auth. Provider   cetirizine (ZYRTEC ALLERGY) 10 MG tablet Take 1 tablet (10 mg total) by mouth at bedtime. 30 tablet Berkeley Veldman K, PA-C   fluticasone (FLONASE) 50 MCG/ACT nasal spray Place 1 spray into both nostrils daily. 16 g Aslan Himes K, PA-C   promethazine-dextromethorphan (PROMETHAZINE-DM) 6.25-15 MG/5ML syrup Take 5 mLs by mouth at bedtime as needed for cough. 118 mL Dmitry Macomber K, PA-C      PDMP not reviewed this encounter.   Terrilee Croak, PA-C 04/11/22 1445

## 2022-04-11 NOTE — Telephone Encounter (Signed)
--  Caller states she has a sinus infection. She has a headache that is over the temples of her eye and congestion. Nasal secretions are green. Headache is 9/10  04/11/2022 8:33:05 AM See HCP within 4 Hours (or PCP triage) Clovis Riley, RN, [facility] [place]  04/11/22 1010 - LVM for pt to return call if needed.

## 2022-04-11 NOTE — ED Triage Notes (Addendum)
Pt c/o upper facial pain/ pressure, bilateral ear pressure and ringing in ears and congestion.  Home interventions: mucinex, tylenol (today)  Started: 04/09/22

## 2022-04-12 LAB — SARS CORONAVIRUS 2 (TAT 6-24 HRS): SARS Coronavirus 2: NEGATIVE

## 2022-04-13 ENCOUNTER — Telehealth: Payer: Self-pay

## 2022-04-13 ENCOUNTER — Ambulatory Visit
Admission: RE | Admit: 2022-04-13 | Discharge: 2022-04-13 | Disposition: A | Discharge status: Home / Self Care | Payer: Medicare HMO | Source: Ambulatory Visit | Attending: Physician Assistant | Admitting: Physician Assistant

## 2022-04-13 VITALS — BP 130/79 | HR 61 | Temp 97.7°F | Resp 19

## 2022-04-13 DIAGNOSIS — J069 Acute upper respiratory infection, unspecified: Secondary | ICD-10-CM | POA: Diagnosis not present

## 2022-04-13 DIAGNOSIS — J0100 Acute maxillary sinusitis, unspecified: Secondary | ICD-10-CM | POA: Diagnosis not present

## 2022-04-13 MED ORDER — IBUPROFEN 600 MG PO TABS: 600.0000 mg | ORAL_TABLET | Freq: Four times a day (QID) | ORAL | 0 refills | Status: AC | PRN

## 2022-04-13 MED ORDER — TRIAMCINOLONE ACETONIDE 40 MG/ML IJ SUSP
60.0000 mg | Freq: Once | INTRAMUSCULAR | Status: AC
  Administered 2022-04-13: 60 mg via INTRAMUSCULAR

## 2022-04-13 MED ORDER — AMOXICILLIN-POT CLAVULANATE 875-125 MG PO TABS: 1.0000 | ORAL_TABLET | Freq: Two times a day (BID) | ORAL | 0 refills | Status: AC

## 2022-04-13 NOTE — ED Triage Notes (Signed)
Pt presents to uc with co of facial pain and congestion and otalgia. Pt reports she was sent home with sinus medication and her symptoms continue.

## 2022-04-13 NOTE — ED Provider Notes (Signed)
[facility]    CSN: [id number] Arrival date & time: 04/13/22  0945      History   Chief Complaint Chief Complaint  Patient presents with   Otalgia   Facial Pain    HPI Brianna Foster is a 67 y.o. female.   76-year-old female presents with sinus pressure and headache.  Patient indicates that she continues to have frontal and maxillary sinus pain, pressure, tenderness with upper teeth pain associated.  Patient indicates that she is also having bilateral ear pressure with the right having more discomfort than the left.  She indicates she is having production which is thick and green.  She relates she has not have any fever or chills.  Patient indicates that she is using the Flonase nasal spray, and the allergy medicine but this has not given improvement of her symptoms over the past couple days.  Patient relates she has had a headache for the past several days which is mainly frontal constant and is not relieved with Tylenol OTC.  Patient indicates she is tolerating fluids well without any nausea or vomiting.   Otalgia Associated symptoms: rhinorrhea     Past Medical History:  Diagnosis Date   Arthritis    COVID-19 03/2020   had pneumonia   GERD (gastroesophageal reflux disease)    Headache(784.0)    Hypertension    OSA (obstructive sleep apnea) 08/28/2017   uses a Cpap   Pneumonia     Patient Active Problem List   Diagnosis Date Noted   Morbid obesity ([place]) 01/30/2021   Incontinence of urine in female 01/30/2021   Pneumonia due to COVID-19 virus 05/10/2020   Hypokalemia 04/13/2020   Healthcare maintenance 12/24/2018   Rhinitis, allergic 06/23/2018   OSA (obstructive sleep apnea) 08/28/2017   Recurrent genital herpes 07/19/2017   Unstable angina ([place]) 07/11/2017   De Quervain's tenosynovitis, right 05/08/2016   Cervical radiculopathy 05/08/2016   Benign paroxysmal positional vertigo 03/20/2016   Eczema 03/20/2016   Hyperlipidemia, unspecified  03/20/2016   Vertigo 10/28/2015   Headache 10/28/2015   Tobacco abuse 07/23/2012   Gastroesophageal reflux disease 06/18/2011   Essential hypertension 06/18/2011    Past Surgical History:  Procedure Laterality Date   CERVICAL FUSION     LEFT HEART CATH AND CORONARY ANGIOGRAPHY N/A 07/11/2017   Procedure: LEFT HEART CATH AND CORONARY ANGIOGRAPHY;  Surgeon: Martinique, Peter M, MD;  Location: [facility];  Service: Cardiovascular;  Laterality: N/A;   LEFT HEART CATHETERIZATION WITH CORONARY ANGIOGRAM N/A 05/11/2013   Procedure: LEFT HEART CATHETERIZATION WITH CORONARY ANGIOGRAM;  Surgeon: Burnell Blanks, MD;  Location: [facility] CATH LAB;  Service: Cardiovascular;  Laterality: N/A;   neck fusion     RADIOLOGY WITH ANESTHESIA Right 02/28/2021   Procedure: MRI OF THE ELBOW WITHOUT CONTRAST WITH ANESTHESIA;  Surgeon: Radiologist, Medication, MD;  Location: [facility];  Service: Radiology;  Laterality: Right;    OB History     Gravida  1   Para  1   Term  1   Preterm      AB  0   Living  1      SAB      IAB      Ectopic  0   Multiple      Live Births               Home Medications    Prior to Admission medications   Medication Sig Start Date End Date Taking? Authorizing Provider  amoxicillin-clavulanate (AUGMENTIN) 875-125 MG tablet Take 1 tablet by mouth every 12 (twelve) hours. 04/13/22  Yes Nyoka Lint, PA-C  ibuprofen (ADVIL) 600 MG tablet Take 1 tablet (600 mg total) by mouth every 6 (six) hours as needed. 04/13/22  Yes Nyoka Lint, PA-C  albuterol (VENTOLIN HFA) 108 (90 Base) MCG/ACT inhaler INHALE 1-2 PUFFS BY MOUTH EVERY 6 HOURS AS NEEDED FOR WHEEZE OR SHORTNESS OF BREATH 07/19/21   Martinique, Betty G, MD  amLODipine (NORVASC) 5 MG tablet TAKE 1 TABLET (5 MG TOTAL) BY MOUTH DAILY. 03/19/22   Martinique, Betty G, MD  Ascorbic Acid (VITAMIN C) 1000 MG tablet Take 1,000 mg by mouth daily.    [provider]  cetirizine (ZYRTEC ALLERGY) 10 MG tablet Take 1  tablet (10 mg total) by mouth at bedtime. 04/11/22   Raspet, Derry Skill, PA-C  cholecalciferol (VITAMIN D3) 25 MCG (1000 UNIT) tablet Take 1,000 Units by mouth daily.    [provider]  clobetasol ointment (TEMOVATE) 0.25 % Apply 1 application topically 2 (two) times a week. Initial dose: twice daily x 14 days, then decrease to daily x 14 days, then every other day x 14 days, then twice weekly for duration of use 04/20/21   Marny Lowenstein A, NP  cyclobenzaprine (FLEXERIL) 5 MG tablet Take 1 tablet (5 mg total) by mouth at bedtime. 01/30/22   Glennon Mac, DO  fluticasone (FLONASE) 50 MCG/ACT nasal spray Place 1 spray into both nostrils daily. 04/11/22   Raspet, Derry Skill, PA-C  hydrochlorothiazide (HYDRODIURIL) 25 MG tablet TAKE 1 TABLET (25 MG TOTAL) BY MOUTH DAILY. 02/16/22   Martinique, Betty G, MD  meclizine (ANTIVERT) 25 MG tablet TAKE 1 TABLET (25 MG TOTAL) BY MOUTH DAILY AS NEEDED FOR DIZZINESS. 08/07/21   Martinique, Betty G, MD  metoprolol tartrate (LOPRESSOR) 25 MG tablet TAKE 0.5 TABLETS BY MOUTH 2 TIMES DAILY. 10/18/21   Martinique, Betty G, MD  nitroGLYCERIN (NITRODUR - DOSED IN MG/24 HR) 0.2 mg/hr patch PLACE 1/4 TO 1/2 OF A PATCH OVER AFFECTED REGION. REMOVE AND REPLACE ONCE DAILY. SLIGHTLY ALTER SKIN PLACEMENT DAILY 08/16/21   Gregor Hams, MD  pantoprazole (PROTONIX) 40 MG tablet Take 1 tablet (40 mg total) by mouth 2 (two) times daily. Please schedule a yearly follow up for further refills. Thank you 03/19/22   Levin Erp, PA  promethazine-dextromethorphan (PROMETHAZINE-DM) 6.25-15 MG/5ML syrup Take 5 mLs by mouth at bedtime as needed for cough. 04/11/22   Raspet, Derry Skill, PA-C  Spacer/Aero-Holding Chambers (AEROCHAMBER PLUS) inhaler Use as instructed to use with inahaler. 09/02/20   Martinique, Betty G, MD  sucralfate (CARAFATE) 1 g tablet Take 1 tablet (1 g total) by mouth every 6 (six) hours as needed. Slowly dissolve 1 tablet in 1 Tablespoon of distilled water before ingesting 06/02/20    Armbruster, Carlota Raspberry, MD  triamcinolone cream (KENALOG) 0.1 % APPLY 1 APPLICATION TOPICALLY 2 (TWO) TIMES DAILY AS NEEDED. 02/27/21   Burchette, Alinda Sierras, MD  valACYclovir (VALTREX) 500 MG tablet 1 TAB TWICE DAILY FOR 3 DAYS WITH OUTBREAKS, START WITHIN 48 HOURS AFTER ONSET. 06/22/21   Martinique, Betty G, MD    Family History Family History  Problem Relation Age of Onset   Lung cancer Mother    Stomach cancer Father    Heart disease Brother        Pacemaker   Esophageal cancer Brother    Breast cancer Sister    Colon cancer Neg Hx    Pancreatic cancer Neg  Hx    Liver disease Neg Hx    Rectal cancer Neg Hx     Social History Social History   Tobacco Use   Smoking status: Former    Packs/day: 0.25    Years: 16.00    Total pack years: 4.00    Types: Cigarettes    Quit date: 03/09/2018    Years since quitting: 4.0   Smokeless tobacco: Never  Vaping Use   Vaping Use: Never used  Substance Use Topics   Alcohol use: Yes    Comment: socially   Drug use: No     Allergies   Other   Review of Systems Review of Systems  HENT:  Positive for ear pain, postnasal drip, rhinorrhea and sinus pressure.      Physical Exam Triage Vital Signs ED Triage Vitals  Enc Vitals Group     BP 04/13/22 1003 130/79     Pulse Rate 04/13/22 1003 61     Resp 04/13/22 1003 19     Temp 04/13/22 1003 97.7 F (36.5 C)     Temp src --      SpO2 04/13/22 1003 98 %     Weight --      Height --      Head Circumference --      Peak Flow --      Pain Score 04/13/22 1002 8     Pain Loc --      Pain Edu? --      Excl. in [place]? --    No data found.  Updated Vital Signs BP 130/79   Pulse 61   Temp 97.7 F (36.5 C)   Resp 19   LMP 01/07/2010 (LMP Unknown)   SpO2 98%   Visual Acuity Right Eye Distance:   Left Eye Distance:   Bilateral Distance:    Right Eye Near:   Left Eye Near:    Bilateral Near:     Physical Exam Constitutional:      Appearance: Normal appearance.  HENT:      Right Ear: Ear canal normal. Tympanic membrane is injected.     Left Ear: Ear canal normal. Tympanic membrane is injected.     Mouth/Throat:     Mouth: Mucous membranes are moist.     Pharynx: Oropharynx is clear.  Cardiovascular:     Rate and Rhythm: Normal rate and regular rhythm.     Heart sounds: Normal heart sounds.  Pulmonary:     Effort: Pulmonary effort is normal.     Breath sounds: Normal breath sounds and air entry. No wheezing, rhonchi or rales.  Lymphadenopathy:     Cervical: No cervical adenopathy.  Neurological:     Mental Status: She is alert.      UC Treatments / Results  Labs (all labs ordered are listed, but only abnormal results are displayed) Labs Reviewed - No data to display  EKG   Radiology No results found.  Procedures Procedures (including critical care time)  Medications Ordered in UC Medications  triamcinolone acetonide (KENALOG-40) injection 60 mg (has no administration in time range)    Initial Impression / Assessment and Plan / UC Course  I have reviewed the triage vital signs and the nursing notes.  Pertinent labs & imaging results that were available during my care of the patient were reviewed by me and considered in my medical decision making (see chart for details).    Plan: 1.  Maxillary sinus infection will be treated with the  following: A.  Augmentin 875 mg every 12 hours with food to help treat the infection. B.  Kenalog 60 mg given IM to help reduce the sinus inflammatory response. 2.  The acute upper respiratory infection be treated with the following: A.  Advised patient to continue Flonase nasal spray, 2 sprays each nostril once daily to help decrease the upper respiratory congestion. B.  Ibuprofen 600 mg every 6 hours as needed to treat headache and discomfort. 3.  Advised follow-up PCP return to urgent care if symptoms fail to improve. Final Clinical Impressions(s) / UC Diagnoses   Final diagnoses:  Acute upper  respiratory infection  Acute non-recurrent maxillary sinusitis     Discharge Instructions      Advised to continue present medications especially the Flonase nasal spray, 2 sprays each nostril once a day to help with sinus congestion. Advised take the Augmentin 875 mg, every 12 hours with food to help treat the infection. Advised take ibuprofen 600 mg for headache as needed but no more than every 6 hours. Advised follow-up PCP or return to urgent care if symptoms fail to improve.    ED Prescriptions     Medication Sig Dispense Auth. Provider   ibuprofen (ADVIL) 600 MG tablet Take 1 tablet (600 mg total) by mouth every 6 (six) hours as needed. 30 tablet Nyoka Lint, PA-C   amoxicillin-clavulanate (AUGMENTIN) 875-125 MG tablet Take 1 tablet by mouth every 12 (twelve) hours. 14 tablet Nyoka Lint, PA-C      PDMP not reviewed this encounter.   Nyoka Lint, PA-C 04/13/22 1020

## 2022-04-13 NOTE — Telephone Encounter (Signed)
--  Caller states she went to urgent care yesterday and is negative Covid. Caller states she has pressure in her head, couldn't sleep, fluid in ear and believes she has a sinus infection and would like a medication called in. Caller states she has the worst HA. Had a temp last night, was sweating in the night  04/12/2022 8:57:22 AM Go to ED Now (or PCP triage) Rolin Barry, RN, [facility] [place]  Will send to PCP for triage

## 2022-04-13 NOTE — Telephone Encounter (Signed)
Has appt with UC this morning at 10am.

## 2022-04-13 NOTE — Discharge Instructions (Signed)
Advised to continue present medications especially the Flonase nasal spray, 2 sprays each nostril once a day to help with sinus congestion. Advised take the Augmentin 875 mg, every 12 hours with food to help treat the infection. Advised take ibuprofen 600 mg for headache as needed but no more than every 6 hours. Advised follow-up PCP or return to urgent care if symptoms fail to improve.

## 2022-04-18 ENCOUNTER — Other Ambulatory Visit: Payer: Self-pay | Admitting: Physician Assistant

## 2022-04-19 ENCOUNTER — Ambulatory Visit (INDEPENDENT_AMBULATORY_CARE_PROVIDER_SITE_OTHER): Payer: Medicare HMO | Admitting: Family Medicine

## 2022-04-19 ENCOUNTER — Encounter: Payer: Self-pay | Admitting: Family Medicine

## 2022-04-19 VITALS — BP 142/90 | HR 85 | Temp 98.5°F | Ht 70.0 in | Wt 248.7 lb

## 2022-04-19 DIAGNOSIS — F430 Acute stress reaction: Secondary | ICD-10-CM

## 2022-04-19 MED ORDER — VENLAFAXINE HCL ER 37.5 MG PO CP24: 37.5000 mg | ORAL_CAPSULE | Freq: Every day | ORAL | 1 refills | Status: AC

## 2022-04-19 MED ORDER — TRAZODONE HCL 50 MG PO TABS: 25.0000 mg | ORAL_TABLET | Freq: Every evening | ORAL | 3 refills | Status: AC | PRN

## 2022-04-19 NOTE — Progress Notes (Signed)
Established Patient Office Visit  Subjective   Patient ID: DJENEBA BARSCH, female    DOB: 1955/05/11  Age: 67 y.o. MRN: [id number]  Chief Complaint  Patient presents with   Stress    Patient states she has been extremely stressed, sexually harrassed on her job and anxious due to her job , lack of appetite and nausea only, denies vomiting x1 month   Nausea    X1 month    Patient was recently seen on 04/13/22 at the urgent care for sinus pressure and pain, URI symptoms. She was diagnosed with acute sinusitis and given augmentin, states that her sinus issues are improving with the medication she was given.   Pt reports that she has a lot of stress at her job recently, pt reports she thinks that it is starting to affect her physically and emotionally now. States that she is losing weight over the past 2 weeks. Pt reports she quit her job today due to the stress, lots of crying spells, anxiety, diarrhea, difficulty sleeping, states she has trouble falling asleep and also having awakenings in the middle of the night and she cannot go back to sleep.    Current Outpatient Medications  Medication Instructions   albuterol (VENTOLIN HFA) 108 (90 Base) MCG/ACT inhaler INHALE 1-2 PUFFS BY MOUTH EVERY 6 HOURS AS NEEDED FOR WHEEZE OR SHORTNESS OF BREATH   amLODipine (NORVASC) 5 MG tablet Oral, Daily   amoxicillin-clavulanate (AUGMENTIN) 875-125 MG tablet 1 tablet, Oral, Every 12 hours   cetirizine (ZYRTEC ALLERGY) 10 mg, Oral, Daily at bedtime   cholecalciferol (VITAMIN D3) 1,000 Units, Oral, Daily   clobetasol ointment (TEMOVATE) 6.22 % 1 application , Topical, 2 times weekly, Initial dose: twice daily x 14 days, then decrease to daily x 14 days, then every other day x 14 days, then twice weekly for duration of use   cyclobenzaprine (FLEXERIL) 5 mg, Oral, Daily at bedtime   fluticasone (FLONASE) 50 MCG/ACT nasal spray 1 spray, Each Nare, Daily   hydrochlorothiazide (HYDRODIURIL) 25 MG tablet Oral,  Daily   ibuprofen (ADVIL) 600 mg, Oral, Every 6 hours PRN   meclizine (ANTIVERT) 25 mg, Oral, Daily PRN   metoprolol tartrate (LOPRESSOR) 25 MG tablet TAKE 0.5 TABLETS BY MOUTH 2 TIMES DAILY.   nitroGLYCERIN (NITRODUR - DOSED IN MG/24 HR) 0.2 mg/hr patch PLACE 1/4 TO 1/2 OF A PATCH OVER AFFECTED REGION. REMOVE AND REPLACE ONCE DAILY. SLIGHTLY ALTER SKIN PLACEMENT DAILY   pantoprazole (PROTONIX) 40 mg, Oral, 2 times daily, Please schedule a yearly follow up for further refills. Thank you   promethazine-dextromethorphan (PROMETHAZINE-DM) 6.25-15 MG/5ML syrup 5 mLs, Oral, At bedtime PRN   Spacer/Aero-Holding Chambers (AEROCHAMBER PLUS) inhaler Use as instructed to use with inahaler.   sucralfate (CARAFATE) 1 g, Oral, Every 6 hours PRN, Slowly dissolve 1 tablet in 1 Tablespoon of distilled water before ingesting   traZODone (DESYREL) 25-50 mg, Oral, At bedtime PRN   triamcinolone cream (KENALOG) 0.1 % 1 application , Topical, 2 times daily PRN   valACYclovir (VALTREX) 500 MG tablet 1 TAB TWICE DAILY FOR 3 DAYS WITH OUTBREAKS, START WITHIN 48 HOURS AFTER ONSET.   venlafaxine XR (EFFEXOR XR) 37.5 mg, Oral, Daily with breakfast   vitamin C 1,000 mg, Oral, Daily     Patient Active Problem List   Diagnosis Date Noted   Morbid obesity ([place]) 01/30/2021   Incontinence of urine in female 01/30/2021   Pneumonia due to COVID-19 virus 05/10/2020   Hypokalemia 04/13/2020  Healthcare maintenance 12/24/2018   Rhinitis, allergic 06/23/2018   OSA (obstructive sleep apnea) 08/28/2017   Recurrent genital herpes 07/19/2017   Unstable angina ([place]) 07/11/2017   De Quervain's tenosynovitis, right 05/08/2016   Cervical radiculopathy 05/08/2016   Benign paroxysmal positional vertigo 03/20/2016   Eczema 03/20/2016   Hyperlipidemia, unspecified 03/20/2016   Vertigo 10/28/2015   Headache 10/28/2015   Tobacco abuse 07/23/2012   Gastroesophageal reflux disease 06/18/2011   Essential hypertension 06/18/2011       Review of Systems  All other systems reviewed and are negative.     Objective:     BP (!) 142/90 (BP Location: Left Arm, Patient Position: Sitting, Cuff Size: Large)   Pulse 85   Temp 98.5 F (36.9 C) (Oral)   Ht '5\' 10"'$  (1.778 m)   Wt 248 lb 11.2 oz (112.8 kg)   LMP 01/07/2010 (LMP Unknown)   SpO2 98%   BMI 35.68 kg/m  BP Readings from Last 3 Encounters:  04/19/22 (!) 142/90  04/13/22 130/79  04/11/22 135/83      Physical Exam Vitals reviewed.  Constitutional:      Appearance: Normal appearance. She is well-groomed and normal weight.  Eyes:     Conjunctiva/sclera: Conjunctivae normal.  Cardiovascular:     Rate and Rhythm: Normal rate and regular rhythm.     Pulses: Normal pulses.     Heart sounds: S1 normal and S2 normal.  Pulmonary:     Effort: Pulmonary effort is normal.     Breath sounds: Normal breath sounds and air entry.  Neurological:     Mental Status: She is alert and oriented to person, place, and time. Mental status is at baseline.     Gait: Gait is intact.  Psychiatric:        Mood and Affect: Mood is depressed. Affect is tearful.        Speech: Speech normal.        Behavior: Behavior normal.        Judgment: Judgment normal.      No results found for any visits on 04/19/22.    The 10-year ASCVD risk score (Arnett DK, et al., 2019) is: 11.3%    Assessment & Plan:   Problem List Items Addressed This Visit   None Visit Diagnoses     Acute stress reaction with predominately emotional disturbance    -  Primary   Relevant Medications   venlafaxine XR (EFFEXOR XR) 37.5 MG 24 hr capsule   traZODone (DESYREL) 50 MG tablet  Patient was previously on effexor 37.5 mg  daily and she reports the medication did help her. I advised that we restart this medication today and I will give her trazodone 1/2 -1 tablet daily at bedtime to help with the sleep disturbance. She was advised that she will need to follow up with Dr. Martinique in about 6 weeks to  re-evaluate her symptoms. I also suggested that therapy or counselling night also help and I gave her our [facility] brochure.      Return in about 6 weeks (around 05/31/2022) for follow up on anxiety with Dr. Martinique.    Farrel Conners, MD

## 2022-05-01 ENCOUNTER — Other Ambulatory Visit: Payer: Self-pay | Admitting: Family Medicine

## 2022-05-01 DIAGNOSIS — Z1231 Encounter for screening mammogram for malignant neoplasm of breast: Secondary | ICD-10-CM

## 2022-05-02 ENCOUNTER — Ambulatory Visit (INDEPENDENT_AMBULATORY_CARE_PROVIDER_SITE_OTHER): Payer: Medicare HMO | Admitting: Physician Assistant

## 2022-05-02 ENCOUNTER — Encounter: Payer: Self-pay | Admitting: Physician Assistant

## 2022-05-02 VITALS — BP 120/80 | HR 60 | Ht 69.0 in | Wt 252.0 lb

## 2022-05-02 DIAGNOSIS — K219 Gastro-esophageal reflux disease without esophagitis: Secondary | ICD-10-CM | POA: Diagnosis not present

## 2022-05-02 DIAGNOSIS — Z86010 Personal history of colonic polyps: Secondary | ICD-10-CM

## 2022-05-02 DIAGNOSIS — R0789 Other chest pain: Secondary | ICD-10-CM

## 2022-05-02 MED ORDER — PANTOPRAZOLE SODIUM 40 MG PO TBEC: 40.0000 mg | DELAYED_RELEASE_TABLET | Freq: Every day | ORAL | 3 refills | Status: DC

## 2022-05-02 MED ORDER — NA SULFATE-K SULFATE-MG SULF 17.5-3.13-1.6 GM/177ML PO SOLN: 1.0000 | Freq: Once | ORAL | 0 refills | Status: DC

## 2022-05-02 NOTE — Patient Instructions (Addendum)
We have sent the following medications to your pharmacy for you to pick up at your convenience: Pantoprazole 40 mg  It has been recommended to you by your physician that you have a(n) Endoscopy & Colonoscopy  completed. Per your request, we did not schedule the procedure(s) today. Please contact our office at [phone] should you decide to have the procedure completed. You will be scheduled for a pre-visit and procedure at that time.   Due to recent changes in healthcare laws, you may see the results of your imaging and laboratory studies on MyChart before your provider has had a chance to review them.  We understand that in some cases there may be results that are confusing or concerning to you. Not all laboratory results come back in the same time frame and the provider may be waiting for multiple results in order to interpret others.  Please give [place] 48 hours in order for your provider to thoroughly review all the results before contacting the office for clarification of your results.    It was a pleasure to see you today!  Thank you for trusting me with your gastrointestinal care!

## 2022-05-02 NOTE — Addendum Note (Signed)
Addended by: Annabell Howells on: 05/02/2022 10:27 AM   Modules accepted: Orders

## 2022-05-02 NOTE — Progress Notes (Signed)
Agree with assessment and plan as outlined.  

## 2022-05-02 NOTE — Progress Notes (Addendum)
Chief Complaint: Follow-up GERD  HPI:    Brianna Foster is a 67 year old female with a past medical history of GERD and obstructive sleep apnea, known to Dr. Havery Moros, who presents to clinic today for follow-up of her reflux and medication refills.    06/01/2020 patient seen in clinic by Dr. Havery Moros and at that time continued with chest discomfort.  She had multiple cardiac workups which were all negative.  At that time recommended an EGD.  Patient's Pantoprazole was stopped and she is transition to a trial of Dexilant 60 mg daily.  Also continued on Carafate 10 cc every 6 hours.  Also her Colonoscopy was placed in recall for January 2024 to be 7 years from her last exam based on 2 small adenomas.    06/16/2020 EGD with a 1 cm hiatal hernia and otherwise normal.  Dr. Havery Moros recommended 24-hour pH impedance test if symptoms persisted.  Biopsies normal.    Today, the patient tells me that she is doing very well as long as she takes her Pantoprazole 40 mg once daily.  She is having no further chest discomfort or other symptoms.  She was not aware that she is due for her surveillance colonoscopy and tells me that she is actually moving to [place] to be with her daughter and her grandchildren at the beginning of February.  Denies any acute GI complaints or concerns.    Denies fever, chills, weight loss, blood in her stool, nausea or vomiting.  Prior workup:  Cardiac cath 07/11/2017 - normal   Colonoscopy 04/27/2015 - A 67m sessile polyp was noted in the hepatic flexure and removed via cold snare. A 5432msessile polyp was noted in the splenic flexure and removed via cold snare. A 32m85messile polyp was noted in the descending colon and removed via cold snare. Four x 3mm8mssile polyps were noted in the rectum and 3 removed via cold snare, one removed via cold forceps given the snare could not grasp it. The remainder of the examined colon was normal. The ileum was normal. Retroflexed views  revealed internal hemorrhoids. Path shows a few adenomas -   Surgical [P], hepatic flexure, splenic flexure, descending, polyp (6) - TUBULAR ADENOMA, 2 FRAGMENTS. - ADDITIONAL FRAGMENTS OF HYPERPLASTIC POLYP AND BENIGN COLORECTAL MUCOSA WITH ASSOCIATED BENIGN LYMPHOID AGGREGATES (REMAINING TISSUE). - MELANOSIS COLI PRESENT. - NO HIGH GRADE DYSPLASIA OR MALIGNANCY IDENTIFIED.   Past Medical History:  Diagnosis Date   Arthritis    COVID-19 03/2020   had pneumonia   GERD (gastroesophageal reflux disease)    Headache(784.0)    Hypertension    OSA (obstructive sleep apnea) 08/28/2017   uses a Cpap   Pneumonia     Past Surgical History:  Procedure Laterality Date   CERVICAL FUSION     LEFT HEART CATH AND CORONARY ANGIOGRAPHY N/A 07/11/2017   Procedure: LEFT HEART CATH AND CORONARY ANGIOGRAPHY;  Surgeon: JordMartiniqueter M, MD;  [facility];  Service: Cardiovascular;  Laterality: N/A;   LEFT HEART CATHETERIZATION WITH CORONARY ANGIOGRAM N/A 05/11/2013   Procedure: LEFT HEART CATHETERIZATION WITH CORONARY ANGIOGRAM;  Surgeon: ChriBurnell Blanks;  [facility];  Service: Cardiovascular;  Laterality: N/A;   neck fusion     RADIOLOGY WITH ANESTHESIA Right 02/28/2021   Procedure: MRI OF THE ELBOW WITHOUT CONTRAST WITH ANESTHESIA;  Surgeon: Radiologist, Medication, MD;  [facility];  Laterality: Right;    Current Outpatient Medications  Medication Sig Dispense Refill  albuterol (VENTOLIN HFA) 108 (90 Base) MCG/ACT inhaler INHALE 1-2 PUFFS BY MOUTH EVERY 6 HOURS AS NEEDED FOR WHEEZE OR SHORTNESS OF BREATH 8.5 each 1   amLODipine (NORVASC) 5 MG tablet TAKE 1 TABLET (5 MG TOTAL) BY MOUTH DAILY. 90 tablet 1   amoxicillin-clavulanate (AUGMENTIN) 875-125 MG tablet Take 1 tablet by mouth every 12 (twelve) hours. 14 tablet 0   Ascorbic Acid (VITAMIN C) 1000 MG tablet Take 1,000 mg by mouth daily.     cetirizine (ZYRTEC ALLERGY) 10 MG  tablet Take 1 tablet (10 mg total) by mouth at bedtime. 30 tablet 0   cholecalciferol (VITAMIN D3) 25 MCG (1000 UNIT) tablet Take 1,000 Units by mouth daily.     clobetasol ointment (TEMOVATE) 3.29 % Apply 1 application topically 2 (two) times a week. Initial dose: twice daily x 14 days, then decrease to daily x 14 days, then every other day x 14 days, then twice weekly for duration of use 45 g 1   cyclobenzaprine (FLEXERIL) 5 MG tablet Take 1 tablet (5 mg total) by mouth at bedtime. 20 tablet 0   fluticasone (FLONASE) 50 MCG/ACT nasal spray Place 1 spray into both nostrils daily. 16 g 0   hydrochlorothiazide (HYDRODIURIL) 25 MG tablet TAKE 1 TABLET (25 MG TOTAL) BY MOUTH DAILY. 90 tablet 1   ibuprofen (ADVIL) 600 MG tablet Take 1 tablet (600 mg total) by mouth every 6 (six) hours as needed. 30 tablet 0   meclizine (ANTIVERT) 25 MG tablet TAKE 1 TABLET (25 MG TOTAL) BY MOUTH DAILY AS NEEDED FOR DIZZINESS. 30 tablet 2   metoprolol tartrate (LOPRESSOR) 25 MG tablet TAKE 0.5 TABLETS BY MOUTH 2 TIMES DAILY. 90 tablet 2   nitroGLYCERIN (NITRODUR - DOSED IN MG/24 HR) 0.2 mg/hr patch PLACE 1/4 TO 1/2 OF A PATCH OVER AFFECTED REGION. REMOVE AND REPLACE ONCE DAILY. SLIGHTLY ALTER SKIN PLACEMENT DAILY 30 patch 1   pantoprazole (PROTONIX) 40 MG tablet Take 1 tablet (40 mg total) by mouth 2 (two) times daily. Please schedule a yearly follow up for further refills. Thank you 60 tablet 0   promethazine-dextromethorphan (PROMETHAZINE-DM) 6.25-15 MG/5ML syrup Take 5 mLs by mouth at bedtime as needed for cough. 118 mL 0   Spacer/Aero-Holding Chambers (AEROCHAMBER PLUS) inhaler Use as instructed to use with inahaler. 1 each 1   sucralfate (CARAFATE) 1 g tablet Take 1 tablet (1 g total) by mouth every 6 (six) hours as needed. Slowly dissolve 1 tablet in 1 Tablespoon of distilled water before ingesting 60 tablet 3   traZODone (DESYREL) 50 MG tablet Take 0.5-1 tablets (25-50 mg total) by mouth at bedtime as needed for  sleep. 30 tablet 3   triamcinolone cream (KENALOG) 0.1 % APPLY 1 APPLICATION TOPICALLY 2 (TWO) TIMES DAILY AS NEEDED. 45 g 1   valACYclovir (VALTREX) 500 MG tablet 1 TAB TWICE DAILY FOR 3 DAYS WITH OUTBREAKS, START WITHIN 48 HOURS AFTER ONSET. 18 tablet 1   venlafaxine XR (EFFEXOR XR) 37.5 MG 24 hr capsule Take 1 capsule (37.5 mg total) by mouth daily with breakfast. 90 capsule 1   No current facility-administered medications for this visit.    Allergies as of 05/02/2022 - Review Complete 04/19/2022  Allergen Reaction Noted   Other Anaphylaxis, Shortness Of Breath, and Swelling 05/25/2018    Family History  Problem Relation Age of Onset   Lung cancer Mother    Stomach cancer Father    Heart disease Brother        Pacemaker  Esophageal cancer Brother    Breast cancer Sister    Colon cancer Neg Hx    Pancreatic cancer Neg Hx    Liver disease Neg Hx    Rectal cancer Neg Hx     Social History   Socioeconomic History   Marital status: Divorced    Spouse name: Not on file   Number of children: 1   Years of education: College   Highest education level: Not on file  Occupational History    Comment: Glass blower/designer  Tobacco Use   Smoking status: Former    Packs/day: 0.25    Years: 16.00    Total pack years: 4.00    Types: Cigarettes    Quit date: 03/09/2018    Years since quitting: 4.1   Smokeless tobacco: Never  Vaping Use   Vaping Use: Never used  Substance and Sexual Activity   Alcohol use: Yes    Comment: socially   Drug use: No   Sexual activity: Not Currently    Birth control/protection: Post-menopausal  Other Topics Concern   Not on file  Social History Narrative   Lives at home alone   Right-handed   Drinks decaf coffee   Social Determinants of Health   Financial Resource Strain: Not on file  Food Insecurity: Not on file  Transportation Needs: Not on file  Physical Activity: Not on file  Stress: Not on file  Social Connections: Not on file   Intimate Partner Violence: Not on file    Review of Systems:    Constitutional: No weight loss, fever or chills Cardiovascular: No chest pain Respiratory: No SOB  Gastrointestinal: See HPI and otherwise negative   Physical Exam:  Vital signs: BP 120/80   Pulse 60   Ht '5\' 9"'$  (1.753 m)   Wt 252 lb (114.3 kg)   LMP 01/07/2010 (LMP Unknown)   BMI 37.21 kg/m    Constitutional:   Pleasant overweight AA female appears to be in NAD, Well developed, Well nourished, alert and cooperative Respiratory: Respirations even and unlabored. Lungs clear to auscultation bilaterally.   No wheezes, crackles, or rhonchi.  Cardiovascular: Normal S1, S2. No MRG. Regular rate and rhythm. No peripheral edema, cyanosis or pallor.  Gastrointestinal:  Soft, nondistended, nontender. No rebound or guarding. Normal bowel sounds. No appreciable masses or hepatomegaly. Rectal:  Not performed.  Psychiatric: Oriented to person, place and time. Demonstrates good judgement and reason without abnormal affect or behaviors.  RELEVANT LABS AND IMAGING: CBC    Component Value Date/Time   WBC 6.8 10/12/2021 0930   RBC 4.64 10/12/2021 0930   HGB 14.5 10/12/2021 0930   HCT 41.2 10/12/2021 0930   PLT 277 10/12/2021 0930   MCV 88.8 10/12/2021 0930   MCV 95.2 10/06/2012 1813   MCH 31.3 10/12/2021 0930   MCHC 35.2 10/12/2021 0930   RDW 12.3 10/12/2021 0930   LYMPHSABS 2.2 04/11/2020 1922   MONOABS 0.9 04/11/2020 1922   EOSABS 0.1 04/11/2020 1922   BASOSABS 0.0 04/11/2020 1922    CMP     Component Value Date/Time   NA 137 10/12/2021 0930   K 4.0 10/12/2021 0930   CL 101 10/12/2021 0930   CO2 26 10/12/2021 0930   GLUCOSE 109 (H) 10/12/2021 0930   BUN 14 10/12/2021 0930   CREATININE 0.85 10/12/2021 0930   CALCIUM 9.8 10/12/2021 0930   PROT 7.9 10/18/2021 0747   ALBUMIN 4.4 10/18/2021 0747   AST 23 10/18/2021 0747   ALT 27 10/18/2021 0747  ALKPHOS 69 10/18/2021 0747   BILITOT 0.3 10/18/2021 0747    GFRNONAA >60 10/12/2021 0930   GFRAA >60 04/20/2019 1441    Assessment: 1.  GERD with atypical chest pain: Controlled on Pantoprazole 40 mg daily, last EGD in March 2022 with a small hiatal hernia and otherwise normal 2.  History of adenomatous polyps: Repeat colonoscopy recommended now  Plan: 1.  We were able to schedule the patient for repeat colonoscopy given her history of adenomatous polyps before she moves to [place].  This was scheduled with Dr. Havery Moros in the [facility].  Did provide the patient a detailed list of risks for the procedure and she agrees to proceed. Patient is appropriate for endoscopic procedure(s) in the ambulatory ([place]) setting.  2.  Refilled Pantoprazole 40 mg daily, 30-60 minutes before breakfast.  #90 with 3 refills. 3.  Patient to follow in clinic per recommendations from Dr. Havery Moros after time of procedure.  Brianna Newer, PA-C [facility] 05/02/2022, 10:01 AM  Cc: Martinique, Betty G, MD   Addendum:  After patient was instructed for colonoscopy she decided that she did not want to have it before she moved and would just follow-up with a GI doctor in [place].  She is aware that she is due for colon cancer screening.  Brianna Newer, PA-C

## 2022-05-03 ENCOUNTER — Ambulatory Visit
Admission: RE | Admit: 2022-05-03 | Discharge: 2022-05-03 | Disposition: A | Discharge status: Home / Self Care | Payer: Medicare HMO | Source: Ambulatory Visit | Attending: Family Medicine | Admitting: Family Medicine

## 2022-05-03 DIAGNOSIS — Z1231 Encounter for screening mammogram for malignant neoplasm of breast: Secondary | ICD-10-CM

## 2022-05-07 NOTE — Progress Notes (Signed)
Brianna Foster D.[facility] [address] [place] [address] Phone: [phone]   Assessment and Plan:     1. Chronic bilateral low back pain with right-sided sciatica 2. DDD (degenerative disc disease), lumbar  -Chronic with exacerbation, subsequent visit - Consistent with recurrent flare of lumbar DDD occurring while patient is packing for a move - Patient has had significant relief from IM injection methylprednisone 80 mg/Toradol 60 mg, and requests additional injections today. - Patient elected for IM injection of methylprednisone 80 mg/Toradol 60 mg.  Injection given in clinic today and tolerated well. - Patient did not get lumbar MRI because she is about to move to [place].  Recommend the patient establish care with primary and sports medicine/Ortho when arriving to [place] so that she can continue with her treatment if needed.  Lumbar MRI not needed at this time  Pertinent previous records reviewed include none   Follow Up: As needed   Subjective:   I, Jerene Canny, am serving as a Neurosurgeon for Doctor Richardean Sale   Chief Complaint: back pain    HPI:    01/12/22 Patient is a 67 year old female complaining of back pain. Patient states low back pain , been going on for a couple of month but wasn't as bad , has pain when standing , is fine when sitting, no MOI, has always had back pain when standing, has been taking ib and tylenol for the pain and that does seem to help some, but the relief doesn't last long, pain radiates down the side of her leg , no numbness or tingling, pain is really bad in the morning, stretching helps relief some of the pain in the morning,    01/30/2022 Patient states she is good when she is sitting, but standing and walking is very painful    03/28/2022 Patient states she wants a cocktail and MRI    05/08/2022 Patient states she was doing well , she is moving to [place], tweaked when she was packing      Relevant Historical Information: HTN, GERD  Additional pertinent review of systems negative.   Current Outpatient Medications:    albuterol (VENTOLIN HFA) 108 (90 Base) MCG/ACT inhaler, INHALE 1-2 PUFFS BY MOUTH EVERY 6 HOURS AS NEEDED FOR WHEEZE OR SHORTNESS OF BREATH, Disp: 8.5 each, Rfl: 1   amLODipine (NORVASC) 5 MG tablet, TAKE 1 TABLET (5 MG TOTAL) BY MOUTH DAILY., Disp: 90 tablet, Rfl: 1   amoxicillin-clavulanate (AUGMENTIN) 875-125 MG tablet, Take 1 tablet by mouth every 12 (twelve) hours., Disp: 14 tablet, Rfl: 0   Ascorbic Acid (VITAMIN C) 1000 MG tablet, Take 1,000 mg by mouth daily., Disp: , Rfl:    cetirizine (ZYRTEC ALLERGY) 10 MG tablet, Take 1 tablet (10 mg total) by mouth at bedtime., Disp: 30 tablet, Rfl: 0   cholecalciferol (VITAMIN D3) 25 MCG (1000 UNIT) tablet, Take 1,000 Units by mouth daily., Disp: , Rfl:    clobetasol ointment (TEMOVATE) 0.05 %, Apply 1 application topically 2 (two) times a week. Initial dose: twice daily x 14 days, then decrease to daily x 14 days, then every other day x 14 days, then twice weekly for duration of use, Disp: 45 g, Rfl: 1   cyclobenzaprine (FLEXERIL) 5 MG tablet, Take 1 tablet (5 mg total) by mouth at bedtime., Disp: 20 tablet, Rfl: 0   fluticasone (FLONASE) 50 MCG/ACT nasal spray, Place 1 spray into both nostrils daily., Disp: 16 g, Rfl: 0  hydrochlorothiazide (HYDRODIURIL) 25 MG tablet, TAKE 1 TABLET (25 MG TOTAL) BY MOUTH DAILY., Disp: 90 tablet, Rfl: 1   ibuprofen (ADVIL) 600 MG tablet, Take 1 tablet (600 mg total) by mouth every 6 (six) hours as needed., Disp: 30 tablet, Rfl: 0   meclizine (ANTIVERT) 25 MG tablet, TAKE 1 TABLET (25 MG TOTAL) BY MOUTH DAILY AS NEEDED FOR DIZZINESS., Disp: 30 tablet, Rfl: 2   metoprolol tartrate (LOPRESSOR) 25 MG tablet, TAKE 0.5 TABLETS BY MOUTH 2 TIMES DAILY., Disp: 90 tablet, Rfl: 2   nitroGLYCERIN (NITRODUR - DOSED IN MG/24 HR) 0.2 mg/hr patch, PLACE 1/4 TO 1/2 OF A PATCH OVER AFFECTED REGION.  REMOVE AND REPLACE ONCE DAILY. SLIGHTLY ALTER SKIN PLACEMENT DAILY, Disp: 30 patch, Rfl: 1   pantoprazole (PROTONIX) 40 MG tablet, Take 1 tablet (40 mg total) by mouth daily. Take 30 minutes before breakfast., Disp: 90 tablet, Rfl: 3   promethazine-dextromethorphan (PROMETHAZINE-DM) 6.25-15 MG/5ML syrup, Take 5 mLs by mouth at bedtime as needed for cough., Disp: 118 mL, Rfl: 0   Spacer/Aero-Holding Chambers (AEROCHAMBER PLUS) inhaler, Use as instructed to use with inahaler., Disp: 1 each, Rfl: 1   sucralfate (CARAFATE) 1 g tablet, Take 1 tablet (1 g total) by mouth every 6 (six) hours as needed. Slowly dissolve 1 tablet in 1 Tablespoon of distilled water before ingesting, Disp: 60 tablet, Rfl: 3   traZODone (DESYREL) 50 MG tablet, Take 0.5-1 tablets (25-50 mg total) by mouth at bedtime as needed for sleep., Disp: 30 tablet, Rfl: 3   triamcinolone cream (KENALOG) 0.1 %, APPLY 1 APPLICATION TOPICALLY 2 (TWO) TIMES DAILY AS NEEDED., Disp: 45 g, Rfl: 1   valACYclovir (VALTREX) 500 MG tablet, 1 TAB TWICE DAILY FOR 3 DAYS WITH OUTBREAKS, START WITHIN 48 HOURS AFTER ONSET., Disp: 18 tablet, Rfl: 1   venlafaxine XR (EFFEXOR XR) 37.5 MG 24 hr capsule, Take 1 capsule (37.5 mg total) by mouth daily with breakfast., Disp: 90 capsule, Rfl: 1   Objective:     Vitals:   05/08/22 1537  Pulse: 81  SpO2: 95%  Weight: 253 lb (114.8 kg)  Height: 5\' 9"  (1.753 m)      Body mass index is 37.36 kg/m.    Physical Exam:    Gen: Appears well, nad, nontoxic and pleasant Psych: Alert and oriented, appropriate mood and affect Neuro: sensation intact, strength is 5/5 in upper and lower extremities, muscle tone wnl Skin: no susupicious lesions or rashes   Back - Normal skin, Spine with normal alignment and no deformity.     tenderness to lumbar vertebral process palpation.   Bilateral lumbar paraspinous muscles are moderately tender, worse on right compared to left, and without spasm Mild TTP gluteal  musculature Straight leg raise negative on left, negative on right Trendelenberg negative on left Piriformis Test negative on right    Electronically signed by:  Brianna Foster D.[facility] 3:45 PM 05/08/22

## 2022-05-08 ENCOUNTER — Ambulatory Visit (INDEPENDENT_AMBULATORY_CARE_PROVIDER_SITE_OTHER): Payer: Medicare HMO | Admitting: Sports Medicine

## 2022-05-08 ENCOUNTER — Encounter: Payer: Medicare HMO | Admitting: Gastroenterology

## 2022-05-08 VITALS — HR 81 | Ht 69.0 in | Wt 253.0 lb

## 2022-05-08 DIAGNOSIS — G8929 Other chronic pain: Secondary | ICD-10-CM | POA: Diagnosis not present

## 2022-05-08 DIAGNOSIS — M5441 Lumbago with sciatica, right side: Secondary | ICD-10-CM

## 2022-05-08 DIAGNOSIS — M5136 Other intervertebral disc degeneration, lumbar region: Secondary | ICD-10-CM

## 2022-05-08 MED ORDER — KETOROLAC TROMETHAMINE 60 MG/2ML IM SOLN
60.0000 mg | Freq: Once | INTRAMUSCULAR | Status: AC
  Administered 2022-05-08: 60 mg via INTRAMUSCULAR

## 2022-05-08 MED ORDER — METHYLPREDNISOLONE ACETATE 80 MG/ML IJ SUSP
80.0000 mg | Freq: Once | INTRAMUSCULAR | Status: AC
  Administered 2022-05-08: 80 mg via INTRAMUSCULAR

## 2022-05-08 NOTE — Patient Instructions (Signed)
As needed follow up

## 2022-06-04 DIAGNOSIS — M5416 Radiculopathy, lumbar region: Secondary | ICD-10-CM | POA: Diagnosis not present

## 2022-06-06 DIAGNOSIS — M4726 Other spondylosis with radiculopathy, lumbar region: Secondary | ICD-10-CM | POA: Diagnosis not present

## 2022-06-13 DIAGNOSIS — M5136 Other intervertebral disc degeneration, lumbar region: Secondary | ICD-10-CM | POA: Diagnosis not present

## 2022-06-13 DIAGNOSIS — M5416 Radiculopathy, lumbar region: Secondary | ICD-10-CM | POA: Diagnosis not present

## 2022-06-14 DIAGNOSIS — Z6837 Body mass index (BMI) 37.0-37.9, adult: Secondary | ICD-10-CM | POA: Diagnosis not present

## 2022-06-14 DIAGNOSIS — E785 Hyperlipidemia, unspecified: Secondary | ICD-10-CM | POA: Diagnosis not present

## 2022-06-14 DIAGNOSIS — Z7689 Persons encountering health services in other specified circumstances: Secondary | ICD-10-CM | POA: Diagnosis not present

## 2022-06-14 DIAGNOSIS — R350 Frequency of micturition: Secondary | ICD-10-CM | POA: Diagnosis not present

## 2022-06-14 DIAGNOSIS — I10 Essential (primary) hypertension: Secondary | ICD-10-CM | POA: Diagnosis not present

## 2022-06-14 DIAGNOSIS — Z780 Asymptomatic menopausal state: Secondary | ICD-10-CM | POA: Diagnosis not present

## 2022-06-14 DIAGNOSIS — R799 Abnormal finding of blood chemistry, unspecified: Secondary | ICD-10-CM | POA: Diagnosis not present

## 2022-06-14 DIAGNOSIS — Z1211 Encounter for screening for malignant neoplasm of colon: Secondary | ICD-10-CM | POA: Diagnosis not present

## 2022-07-05 DIAGNOSIS — R350 Frequency of micturition: Secondary | ICD-10-CM | POA: Diagnosis not present

## 2022-07-05 DIAGNOSIS — R42 Dizziness and giddiness: Secondary | ICD-10-CM | POA: Diagnosis not present

## 2022-07-05 DIAGNOSIS — J309 Allergic rhinitis, unspecified: Secondary | ICD-10-CM | POA: Diagnosis not present

## 2022-08-17 ENCOUNTER — Other Ambulatory Visit: Payer: Self-pay | Admitting: Family Medicine

## 2022-08-17 DIAGNOSIS — R600 Localized edema: Secondary | ICD-10-CM

## 2022-08-17 DIAGNOSIS — I10 Essential (primary) hypertension: Secondary | ICD-10-CM

## 2022-09-06 ENCOUNTER — Other Ambulatory Visit: Payer: Self-pay | Admitting: Family Medicine

## 2022-09-06 DIAGNOSIS — I10 Essential (primary) hypertension: Secondary | ICD-10-CM

## 2022-09-06 DIAGNOSIS — R600 Localized edema: Secondary | ICD-10-CM

## 2022-09-17 ENCOUNTER — Other Ambulatory Visit: Payer: Self-pay | Admitting: Family Medicine

## 2022-09-17 DIAGNOSIS — I10 Essential (primary) hypertension: Secondary | ICD-10-CM

## 2022-09-22 ENCOUNTER — Other Ambulatory Visit: Payer: Self-pay | Admitting: Family Medicine

## 2022-09-22 DIAGNOSIS — I10 Essential (primary) hypertension: Secondary | ICD-10-CM

## 2022-09-24 NOTE — Telephone Encounter (Signed)
Is this still your patient?

## 2022-09-29 ENCOUNTER — Other Ambulatory Visit: Payer: Self-pay | Admitting: Family Medicine

## 2022-09-29 DIAGNOSIS — I10 Essential (primary) hypertension: Secondary | ICD-10-CM

## 2022-09-29 DIAGNOSIS — R600 Localized edema: Secondary | ICD-10-CM

## 2022-09-30 ENCOUNTER — Other Ambulatory Visit: Payer: Self-pay | Admitting: Family Medicine

## 2022-09-30 DIAGNOSIS — I10 Essential (primary) hypertension: Secondary | ICD-10-CM

## 2023-01-24 ENCOUNTER — Encounter: Payer: Self-pay | Admitting: Family Medicine

## 2023-01-24 ENCOUNTER — Ambulatory Visit: Payer: Medicare HMO | Admitting: Family Medicine

## 2023-01-24 VITALS — BP 128/82 | HR 56 | Ht 69.0 in | Wt 256.0 lb

## 2023-01-24 DIAGNOSIS — M47816 Spondylosis without myelopathy or radiculopathy, lumbar region: Secondary | ICD-10-CM | POA: Diagnosis not present

## 2023-01-24 DIAGNOSIS — G5701 Lesion of sciatic nerve, right lower limb: Secondary | ICD-10-CM

## 2023-01-24 DIAGNOSIS — M5441 Lumbago with sciatica, right side: Secondary | ICD-10-CM

## 2023-01-24 DIAGNOSIS — G8929 Other chronic pain: Secondary | ICD-10-CM | POA: Diagnosis not present

## 2023-01-24 MED ORDER — METHYLPREDNISOLONE ACETATE 80 MG/ML IJ SUSP
80.0000 mg | Freq: Once | INTRAMUSCULAR | Status: AC
  Administered 2023-01-24: 80 mg via INTRAMUSCULAR

## 2023-01-24 MED ORDER — KETOROLAC TROMETHAMINE 60 MG/2ML IM SOLN
60.0000 mg | Freq: Once | INTRAMUSCULAR | Status: AC
  Administered 2023-01-24: 60 mg via INTRAMUSCULAR

## 2023-01-24 NOTE — Progress Notes (Signed)
   I, Stevenson Clinch, CMA acting as a scribe for Brianna Graham, MD.  Brianna Foster is a 67 y.o. female who presents to [facility] today for ongoing LBP.  Patient was last seen by Dr. Jean Rosenthal on 05/08/2022 and was given a Toradol IM injection.  After that visit patient relocated to [place] and now has returned back to [place].  L-spine MRI was ordered on 01/30/2022 but never obtained.  Today, patient reports continued lower back pain.  Patient locates pain to right side lower back radiating into the gluteal region and posterior thigh. Reports overall normal MRI in [place], did show arthritis. Sx progressively worsening. Denies groin pain. The gluteal region is TTP. Pain does not extend beyond the knee. Denies n/t, weakness.   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   Exam:  BP 128/82   Pulse (!) 56   Ht 5\' 9"  (1.753 m)   Wt 256 lb (116.1 kg)   LMP 01/07/2010 (LMP Unknown)   SpO2 96%   BMI 37.80 kg/m  General: Well Developed, well nourished, and in no acute distress.   MSK: L-spine decreased lumbar motion.  Normal gait.    Lab and Radiology Results  MR OUTSIDE STUDY LUMBAR SPINE STORAGE ONLY (01/15/2023 8:21 AM)  Multi level facet hypertrophy bilaterally      Assessment and Plan: 67 y.o. female with chronic low back pain.  Pain thought to be due to facet arthritis and muscle spasm and dysfunction.  She spends a lot of time and is mostly living in [place].  She will be returning to [place] in a few weeks.  She recently had a visit with PMNR for her chronic back pain and was referred for spinal injection or's procedure.  It sounds like she is going be getting facet injections or  medial branch block and ablations. She has not had physical therapy for this issue.  Plan to refer to physical therapy at [facility] for when she returns back to [place] in a week or 2. For the current pain she has done pretty well in the past with Toradol and Depo-Medrol injections.  Will go ahead and do that now.  Check back as needed when back in [place] area.   PDMP not reviewed this encounter. Orders Placed This Encounter  Procedures   Ambulatory referral to Physical Therapy    Referral Priority:   Routine    Referral Type:   Physical Medicine    Referral Reason:   Specialty Services Required    Requested Specialty:   Physical Therapy    Number of Visits Requested:   1   Meds ordered this encounter  Medications   methylPREDNISolone acetate (DEPO-MEDROL) injection 80 mg   ketorolac (TORADOL) injection 60 mg     Discussed warning signs or symptoms. Please see discharge instructions. Patient expresses understanding.   The above documentation has been reviewed and is accurate and complete Brianna Foster, M.D.

## 2023-01-24 NOTE — Patient Instructions (Signed)
Thank you for coming in today.   Please get an Xray today before you leave   You received an injection today. Seek immediate medical attention if the joint becomes red, extremely painful, or is oozing fluid.   I've referred you to Physical Therapy.  Let us know if you don't hear from them in one week.

## 2023-03-24 ENCOUNTER — Other Ambulatory Visit: Payer: Self-pay | Admitting: Physician Assistant

## 2023-03-29 IMAGING — DX DG LUMBAR SPINE COMPLETE 4+V
5 series · 5 of 5 positions shown · non-contrast
Comparison: None.

CLINICAL DATA: Low back pain

EXAM:
LUMBAR SPINE - COMPLETE 4+ VIEW

[lumbar spine ap]
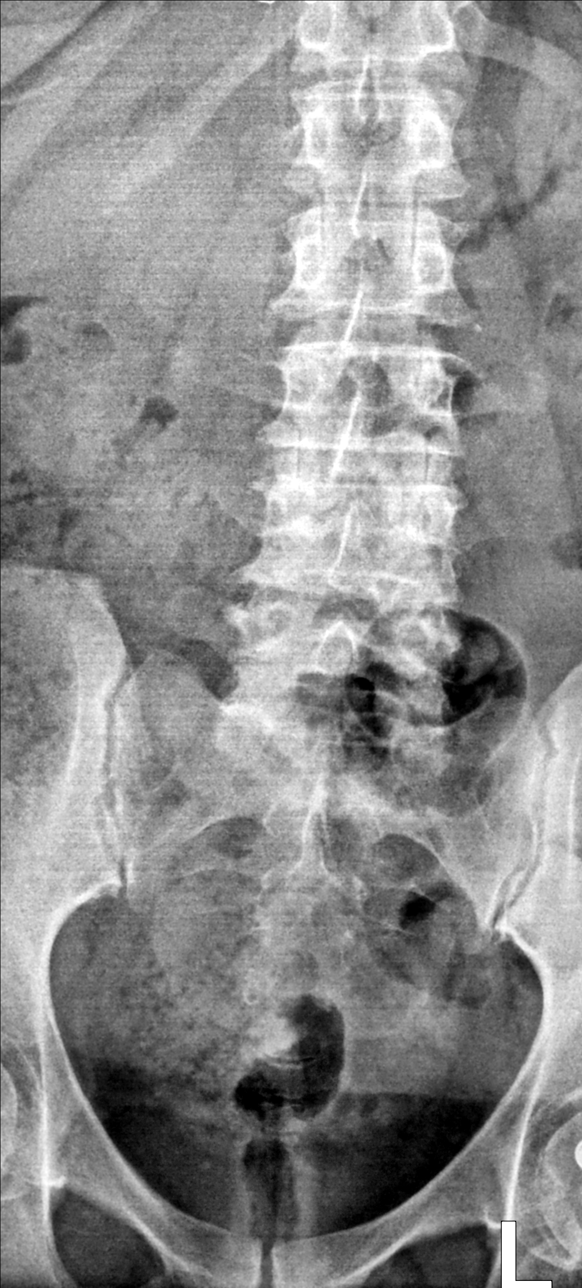

[lumbar spine oblique (1 of 2)]
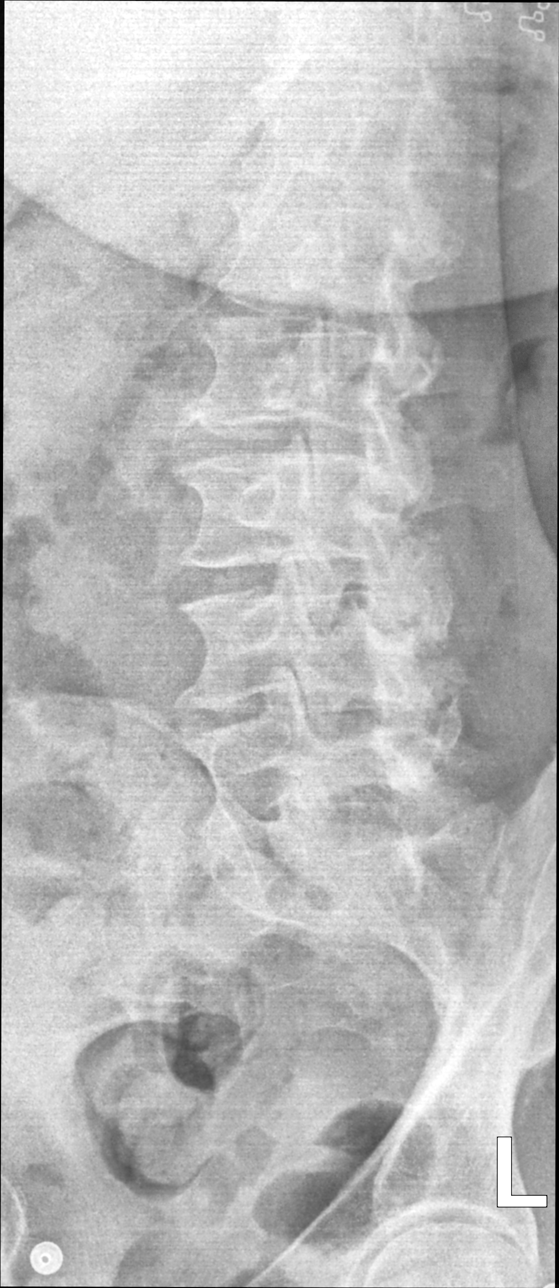

[lumbar spine oblique (2 of 2)]
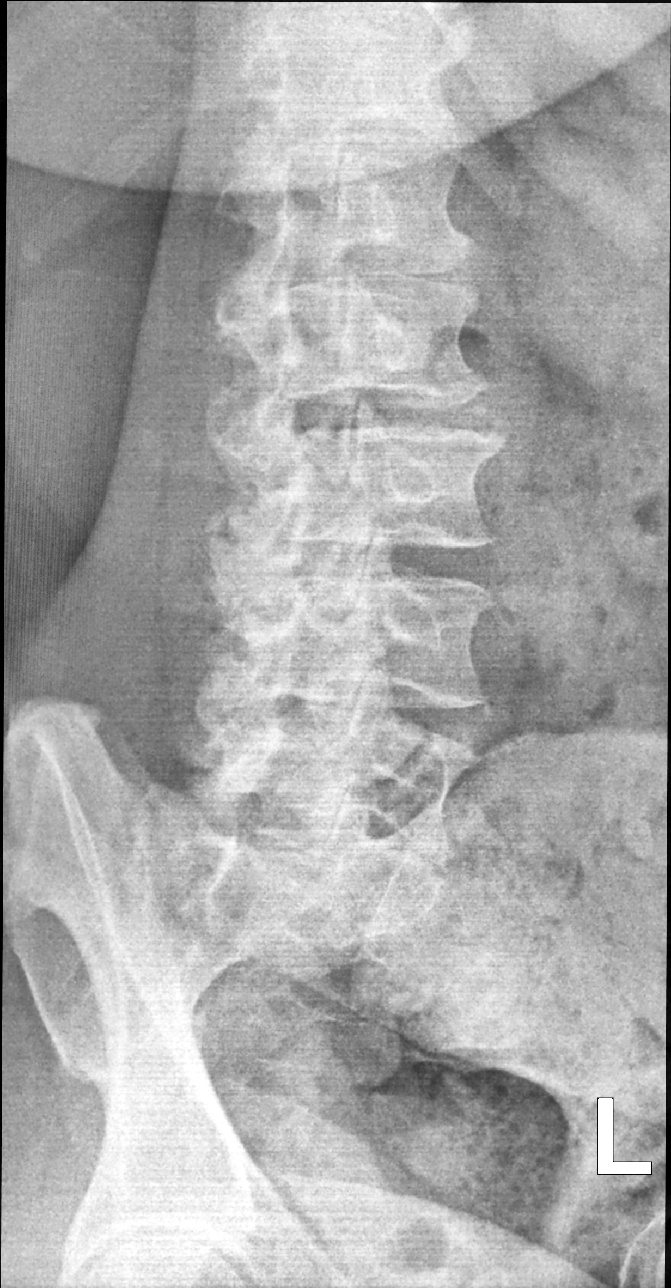

[lumbar spine lat]
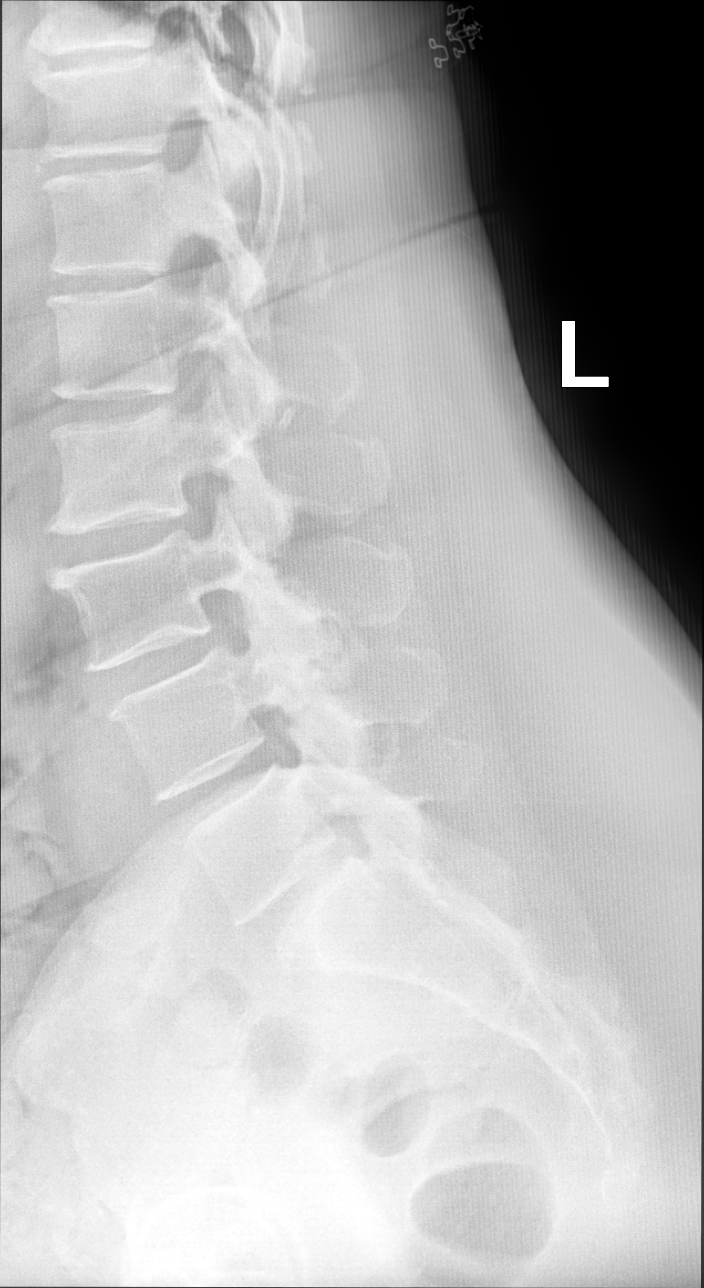

[lumbar spine lat spot]
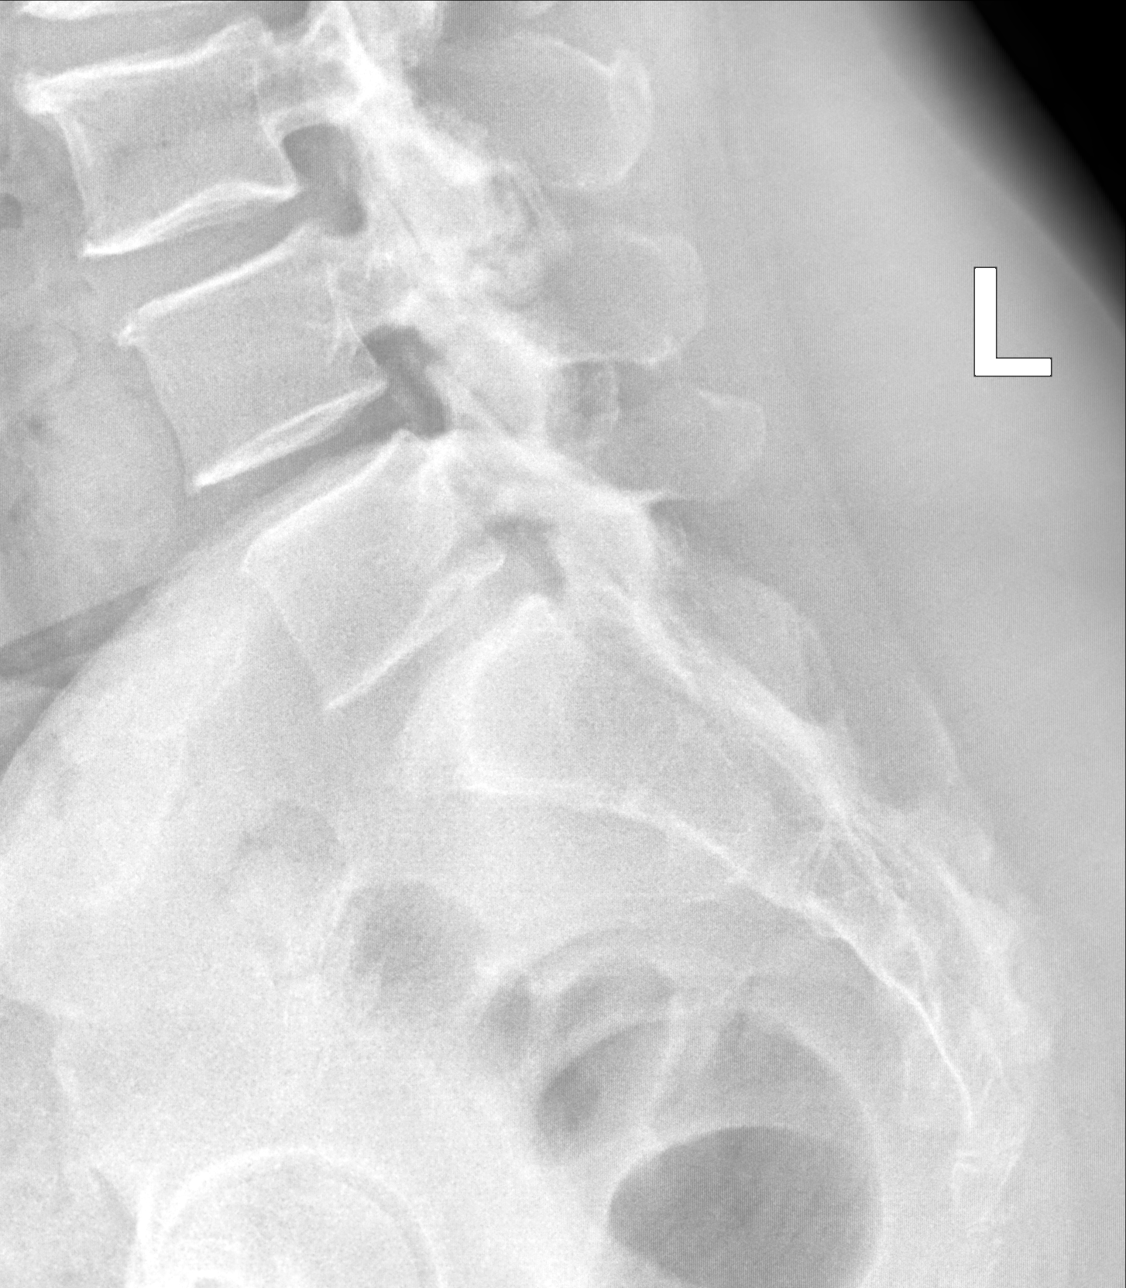

[5 of 5 positions shown; findings below may reference images not displayed]

FINDINGS: Five view radiograph lumbar spine demonstrates normal lumbar
lordosis. No acute fracture or listhesis of the lumbar spine. There
is mild endplate remodeling at T12-L4 in keeping with changes of
mild degenerative disc disease, most severe at L2-3. Vertebral body
height has been preserved. Oblique views demonstrate no evidence of
pars defect. There is bilateral facet arthrosis noted at L3-4 and
L4-5, not well profiled on this examination. The paraspinal soft
tissues are unremarkable.
IMPRESSION: No acute fracture or dislocation. Mild degenerative disc and mild to
moderate degenerative joint disease as described above.

## 2023-12-18 ENCOUNTER — Other Ambulatory Visit: Payer: Self-pay | Admitting: Physician Assistant

## 2024-03-20 ENCOUNTER — Other Ambulatory Visit: Payer: Self-pay | Admitting: Physician Assistant
